# Patient Record
Sex: Female | Born: 1940 | Race: White | Hispanic: No | Smoking: Never smoker
Health system: Southern US, Community
[De-identification: ages and names within clinical notes are randomized; demographics above are authoritative.]

## PROBLEM LIST (undated history)

## (undated) DIAGNOSIS — M797 Fibromyalgia: Secondary | ICD-10-CM

## (undated) DIAGNOSIS — I10 Essential (primary) hypertension: Secondary | ICD-10-CM

## (undated) DIAGNOSIS — C8580 Other specified types of non-Hodgkin lymphoma, unspecified site: Secondary | ICD-10-CM

## (undated) DIAGNOSIS — K589 Irritable bowel syndrome without diarrhea: Secondary | ICD-10-CM

## (undated) DIAGNOSIS — Z8669 Personal history of other diseases of the nervous system and sense organs: Secondary | ICD-10-CM

## (undated) DIAGNOSIS — E785 Hyperlipidemia, unspecified: Secondary | ICD-10-CM

## (undated) DIAGNOSIS — E119 Type 2 diabetes mellitus without complications: Secondary | ICD-10-CM

## (undated) DIAGNOSIS — T7840XA Allergy, unspecified, initial encounter: Secondary | ICD-10-CM

## (undated) DIAGNOSIS — E559 Vitamin D deficiency, unspecified: Secondary | ICD-10-CM

## (undated) DIAGNOSIS — F418 Other specified anxiety disorders: Secondary | ICD-10-CM

## (undated) DIAGNOSIS — K219 Gastro-esophageal reflux disease without esophagitis: Secondary | ICD-10-CM

## (undated) HISTORY — DX: Hyperlipidemia, unspecified: E78.5

## (undated) HISTORY — DX: Vitamin D deficiency, unspecified: E55.9

## (undated) HISTORY — DX: Allergy, unspecified, initial encounter: T78.40XA

## (undated) HISTORY — DX: Other specified types of non-hodgkin lymphoma, unspecified site: C85.80

## (undated) HISTORY — PX: TONSILLECTOMY: SUR1361

## (undated) HISTORY — DX: Irritable bowel syndrome, unspecified: K58.9

## (undated) HISTORY — DX: Other specified anxiety disorders: F41.8

## (undated) HISTORY — DX: Essential (primary) hypertension: I10

## (undated) HISTORY — PX: ABDOMINAL HYSTERECTOMY: SHX81

## (undated) HISTORY — DX: Personal history of other diseases of the nervous system and sense organs: Z86.69

## (undated) HISTORY — DX: Gastro-esophageal reflux disease without esophagitis: K21.9

---

## 1997-09-16 ENCOUNTER — Other Ambulatory Visit: Admission: RE | Admit: 1997-09-16 | Discharge: 1997-09-16 | Payer: Self-pay | Admitting: *Deleted

## 1997-11-19 ENCOUNTER — Other Ambulatory Visit: Admission: RE | Admit: 1997-11-19 | Discharge: 1997-11-19 | Payer: Self-pay | Admitting: Oncology

## 1997-12-23 ENCOUNTER — Ambulatory Visit (HOSPITAL_COMMUNITY): Admission: RE | Admit: 1997-12-23 | Discharge: 1997-12-23 | Payer: Self-pay | Admitting: *Deleted

## 1998-01-08 ENCOUNTER — Encounter: Admission: RE | Admit: 1998-01-08 | Discharge: 1998-04-08 | Payer: Self-pay | Admitting: *Deleted

## 1998-01-28 ENCOUNTER — Other Ambulatory Visit: Admission: RE | Admit: 1998-01-28 | Discharge: 1998-01-28 | Payer: Self-pay | Admitting: *Deleted

## 1998-05-12 ENCOUNTER — Other Ambulatory Visit: Admission: RE | Admit: 1998-05-12 | Discharge: 1998-05-12 | Payer: Self-pay | Admitting: Gastroenterology

## 1998-10-20 ENCOUNTER — Ambulatory Visit (HOSPITAL_COMMUNITY): Admission: RE | Admit: 1998-10-20 | Discharge: 1998-10-20 | Payer: Self-pay | Admitting: *Deleted

## 1999-01-26 ENCOUNTER — Ambulatory Visit (HOSPITAL_COMMUNITY): Admission: RE | Admit: 1999-01-26 | Discharge: 1999-01-26 | Payer: Self-pay | Admitting: *Deleted

## 1999-02-09 ENCOUNTER — Ambulatory Visit (HOSPITAL_COMMUNITY): Admission: RE | Admit: 1999-02-09 | Discharge: 1999-02-09 | Payer: Self-pay | Admitting: *Deleted

## 1999-02-16 ENCOUNTER — Other Ambulatory Visit: Admission: RE | Admit: 1999-02-16 | Discharge: 1999-02-16 | Payer: Self-pay | Admitting: *Deleted

## 2000-03-02 ENCOUNTER — Other Ambulatory Visit: Admission: RE | Admit: 2000-03-02 | Discharge: 2000-03-02 | Payer: Self-pay | Admitting: *Deleted

## 2000-03-14 ENCOUNTER — Ambulatory Visit (HOSPITAL_COMMUNITY): Admission: RE | Admit: 2000-03-14 | Discharge: 2000-03-14 | Payer: Self-pay | Admitting: *Deleted

## 2000-06-21 ENCOUNTER — Encounter: Admission: RE | Admit: 2000-06-21 | Discharge: 2000-06-21 | Payer: Self-pay | Admitting: Rheumatology

## 2000-06-21 ENCOUNTER — Encounter: Payer: Self-pay | Admitting: Rheumatology

## 2001-04-18 ENCOUNTER — Ambulatory Visit (HOSPITAL_COMMUNITY): Admission: RE | Admit: 2001-04-18 | Discharge: 2001-04-18 | Payer: Self-pay | Admitting: Internal Medicine

## 2001-04-18 ENCOUNTER — Encounter: Payer: Self-pay | Admitting: Internal Medicine

## 2002-04-22 ENCOUNTER — Ambulatory Visit (HOSPITAL_COMMUNITY): Admission: RE | Admit: 2002-04-22 | Discharge: 2002-04-22 | Payer: Self-pay | Admitting: Internal Medicine

## 2002-04-22 ENCOUNTER — Encounter: Payer: Self-pay | Admitting: Internal Medicine

## 2002-08-30 ENCOUNTER — Encounter: Admission: RE | Admit: 2002-08-30 | Discharge: 2002-08-30 | Payer: Self-pay | Admitting: Rheumatology

## 2002-08-30 ENCOUNTER — Encounter: Payer: Self-pay | Admitting: Rheumatology

## 2003-04-25 ENCOUNTER — Ambulatory Visit (HOSPITAL_COMMUNITY): Admission: RE | Admit: 2003-04-25 | Discharge: 2003-04-25 | Payer: Self-pay | Admitting: Internal Medicine

## 2003-06-09 ENCOUNTER — Other Ambulatory Visit: Admission: RE | Admit: 2003-06-09 | Discharge: 2003-06-09 | Payer: Self-pay | Admitting: Internal Medicine

## 2003-09-19 ENCOUNTER — Ambulatory Visit (HOSPITAL_COMMUNITY): Admission: RE | Admit: 2003-09-19 | Discharge: 2003-09-19 | Payer: Self-pay | Admitting: Otolaryngology

## 2004-05-24 ENCOUNTER — Encounter: Admission: RE | Admit: 2004-05-24 | Discharge: 2004-05-24 | Payer: Self-pay | Admitting: Internal Medicine

## 2004-06-22 ENCOUNTER — Other Ambulatory Visit: Admission: RE | Admit: 2004-06-22 | Discharge: 2004-06-22 | Payer: Self-pay | Admitting: Internal Medicine

## 2005-02-08 ENCOUNTER — Encounter: Admission: RE | Admit: 2005-02-08 | Discharge: 2005-02-08 | Payer: Self-pay | Admitting: Rheumatology

## 2005-02-21 ENCOUNTER — Encounter: Admission: RE | Admit: 2005-02-21 | Discharge: 2005-02-21 | Payer: Self-pay | Admitting: Rheumatology

## 2005-07-26 ENCOUNTER — Other Ambulatory Visit: Admission: RE | Admit: 2005-07-26 | Discharge: 2005-07-26 | Payer: Self-pay | Admitting: Internal Medicine

## 2005-07-26 ENCOUNTER — Encounter: Admission: RE | Admit: 2005-07-26 | Discharge: 2005-07-26 | Payer: Self-pay | Admitting: Internal Medicine

## 2006-03-31 ENCOUNTER — Emergency Department (HOSPITAL_COMMUNITY): Admission: EM | Admit: 2006-03-31 | Discharge: 2006-04-01 | Payer: Self-pay | Admitting: Emergency Medicine

## 2006-04-04 ENCOUNTER — Ambulatory Visit (HOSPITAL_COMMUNITY): Admission: RE | Admit: 2006-04-04 | Discharge: 2006-04-04 | Payer: Self-pay | Admitting: Internal Medicine

## 2006-04-04 ENCOUNTER — Encounter: Payer: Self-pay | Admitting: Vascular Surgery

## 2006-08-10 ENCOUNTER — Encounter: Admission: RE | Admit: 2006-08-10 | Discharge: 2006-08-10 | Payer: Self-pay | Admitting: Internal Medicine

## 2006-09-21 ENCOUNTER — Encounter: Admission: RE | Admit: 2006-09-21 | Discharge: 2006-09-21 | Payer: Self-pay | Admitting: Rheumatology

## 2007-03-29 ENCOUNTER — Encounter (INDEPENDENT_AMBULATORY_CARE_PROVIDER_SITE_OTHER): Payer: Self-pay | Admitting: Cardiology

## 2007-03-29 ENCOUNTER — Ambulatory Visit (HOSPITAL_COMMUNITY): Admission: RE | Admit: 2007-03-29 | Discharge: 2007-03-29 | Payer: Self-pay | Admitting: Cardiology

## 2007-03-29 ENCOUNTER — Ambulatory Visit: Payer: Self-pay | Admitting: Surgery

## 2007-08-17 ENCOUNTER — Encounter: Admission: RE | Admit: 2007-08-17 | Discharge: 2007-08-17 | Payer: Self-pay | Admitting: Internal Medicine

## 2008-03-05 ENCOUNTER — Encounter: Admission: RE | Admit: 2008-03-05 | Discharge: 2008-03-05 | Payer: Self-pay | Admitting: Orthopaedic Surgery

## 2008-06-17 ENCOUNTER — Ambulatory Visit (HOSPITAL_COMMUNITY): Admission: RE | Admit: 2008-06-17 | Discharge: 2008-06-17 | Payer: Self-pay | Admitting: Internal Medicine

## 2008-08-08 ENCOUNTER — Encounter: Admission: RE | Admit: 2008-08-08 | Discharge: 2008-08-08 | Payer: Self-pay | Admitting: Otolaryngology

## 2008-09-23 ENCOUNTER — Other Ambulatory Visit: Admission: RE | Admit: 2008-09-23 | Discharge: 2008-09-23 | Payer: Self-pay | Admitting: Internal Medicine

## 2008-11-04 ENCOUNTER — Encounter: Admission: RE | Admit: 2008-11-04 | Discharge: 2008-11-04 | Payer: Self-pay | Admitting: Internal Medicine

## 2010-01-13 ENCOUNTER — Ambulatory Visit (HOSPITAL_COMMUNITY): Admission: RE | Admit: 2010-01-13 | Discharge: 2010-01-14 | Payer: Self-pay | Admitting: Surgery

## 2010-03-23 ENCOUNTER — Ambulatory Visit (HOSPITAL_COMMUNITY): Admission: RE | Admit: 2010-03-23 | Discharge: 2010-03-23 | Payer: Self-pay | Admitting: Internal Medicine

## 2010-07-04 ENCOUNTER — Encounter: Payer: Self-pay | Admitting: Hematology & Oncology

## 2010-07-28 ENCOUNTER — Other Ambulatory Visit: Payer: Self-pay | Admitting: Internal Medicine

## 2010-07-28 DIAGNOSIS — Z1231 Encounter for screening mammogram for malignant neoplasm of breast: Secondary | ICD-10-CM

## 2010-08-05 ENCOUNTER — Ambulatory Visit
Admission: RE | Admit: 2010-08-05 | Discharge: 2010-08-05 | Disposition: A | Discharge status: Home / Self Care | Payer: Medicare Other | Source: Ambulatory Visit | Attending: Internal Medicine | Admitting: Internal Medicine

## 2010-08-05 DIAGNOSIS — Z1231 Encounter for screening mammogram for malignant neoplasm of breast: Secondary | ICD-10-CM

## 2010-08-27 LAB — BASIC METABOLIC PANEL
BUN: 17 mg/dL (ref 6–23)
CO2: 28 mEq/L (ref 19–32)
Calcium: 9.2 mg/dL (ref 8.4–10.5)
Chloride: 105 mEq/L (ref 96–112)
Creatinine, Ser: 0.69 mg/dL (ref 0.4–1.2)
GFR calc Af Amer: >60 mL/min (ref >60)
GFR calc non Af Amer: >60 mL/min (ref >60)
Glucose, Bld: 92 mg/dL (ref 70–99)
Potassium: 3.9 mEq/L (ref 3.5–5.1)
Sodium: 142 mEq/L (ref 135–145)

## 2010-08-27 LAB — CBC
HCT: 36.9 % (ref 36.0–46.0)
Hemoglobin: 12.6 g/dL (ref 12.0–15.0)
MCH: 31.7 pg (ref 26.0–34.0)
MCHC: 34.3 g/dL (ref 30.0–36.0)
MCV: 92.6 fL (ref 78.0–100.0)
Platelets: 278 10*3/uL (ref 150–400)
RBC: 3.98 MIL/uL (ref 3.87–5.11)
RDW: 13.5 % (ref 11.5–15.5)
WBC: 16.2 10*3/uL — ABNORMAL HIGH (ref 4.0–10.5)

## 2010-08-27 LAB — ANAEROBIC CULTURE

## 2010-08-27 LAB — SURGICAL PCR SCREEN
MRSA, PCR: NEGATIVE
Staphylococcus aureus: NEGATIVE

## 2010-08-27 LAB — WOUND CULTURE

## 2011-02-24 ENCOUNTER — Other Ambulatory Visit: Payer: Self-pay | Admitting: Internal Medicine

## 2011-02-24 DIAGNOSIS — IMO0002 Reserved for concepts with insufficient information to code with codable children: Secondary | ICD-10-CM

## 2011-02-28 ENCOUNTER — Ambulatory Visit
Admission: RE | Admit: 2011-02-28 | Discharge: 2011-02-28 | Disposition: A | Discharge status: Home / Self Care | Payer: Medicare Other | Source: Ambulatory Visit | Attending: Internal Medicine | Admitting: Internal Medicine

## 2011-02-28 DIAGNOSIS — IMO0002 Reserved for concepts with insufficient information to code with codable children: Secondary | ICD-10-CM

## 2011-03-11 ENCOUNTER — Other Ambulatory Visit: Payer: Self-pay | Admitting: Internal Medicine

## 2011-03-11 DIAGNOSIS — IMO0001 Reserved for inherently not codable concepts without codable children: Secondary | ICD-10-CM

## 2011-03-15 ENCOUNTER — Ambulatory Visit
Admission: RE | Admit: 2011-03-15 | Discharge: 2011-03-15 | Disposition: A | Discharge status: Home / Self Care | Payer: Medicare Other | Source: Ambulatory Visit | Attending: Internal Medicine | Admitting: Internal Medicine

## 2011-03-15 DIAGNOSIS — IMO0001 Reserved for inherently not codable concepts without codable children: Secondary | ICD-10-CM

## 2011-07-11 DIAGNOSIS — Z800 Family history of malignant neoplasm of digestive organs: Secondary | ICD-10-CM | POA: Diagnosis not present

## 2011-07-11 DIAGNOSIS — R198 Other specified symptoms and signs involving the digestive system and abdomen: Secondary | ICD-10-CM | POA: Diagnosis not present

## 2011-07-11 DIAGNOSIS — K5900 Constipation, unspecified: Secondary | ICD-10-CM | POA: Diagnosis not present

## 2011-08-08 DIAGNOSIS — Z1211 Encounter for screening for malignant neoplasm of colon: Secondary | ICD-10-CM | POA: Diagnosis not present

## 2011-08-08 DIAGNOSIS — K644 Residual hemorrhoidal skin tags: Secondary | ICD-10-CM | POA: Diagnosis not present

## 2011-08-08 DIAGNOSIS — K648 Other hemorrhoids: Secondary | ICD-10-CM | POA: Diagnosis not present

## 2011-08-08 DIAGNOSIS — Z800 Family history of malignant neoplasm of digestive organs: Secondary | ICD-10-CM | POA: Diagnosis not present

## 2011-08-08 DIAGNOSIS — K5730 Diverticulosis of large intestine without perforation or abscess without bleeding: Secondary | ICD-10-CM | POA: Diagnosis not present

## 2011-08-08 LAB — HM COLONOSCOPY

## 2011-10-04 DIAGNOSIS — R7309 Other abnormal glucose: Secondary | ICD-10-CM | POA: Diagnosis not present

## 2011-10-04 DIAGNOSIS — Z1212 Encounter for screening for malignant neoplasm of rectum: Secondary | ICD-10-CM | POA: Diagnosis not present

## 2011-10-04 DIAGNOSIS — Z111 Encounter for screening for respiratory tuberculosis: Secondary | ICD-10-CM | POA: Diagnosis not present

## 2011-10-04 DIAGNOSIS — I10 Essential (primary) hypertension: Secondary | ICD-10-CM | POA: Diagnosis not present

## 2011-10-04 DIAGNOSIS — E538 Deficiency of other specified B group vitamins: Secondary | ICD-10-CM | POA: Diagnosis not present

## 2011-10-04 DIAGNOSIS — E782 Mixed hyperlipidemia: Secondary | ICD-10-CM | POA: Diagnosis not present

## 2011-10-04 DIAGNOSIS — E559 Vitamin D deficiency, unspecified: Secondary | ICD-10-CM | POA: Diagnosis not present

## 2011-10-04 DIAGNOSIS — Z79899 Other long term (current) drug therapy: Secondary | ICD-10-CM | POA: Diagnosis not present

## 2011-11-08 DIAGNOSIS — M19049 Primary osteoarthritis, unspecified hand: Secondary | ICD-10-CM | POA: Diagnosis not present

## 2011-11-08 DIAGNOSIS — IMO0002 Reserved for concepts with insufficient information to code with codable children: Secondary | ICD-10-CM | POA: Diagnosis not present

## 2011-11-08 DIAGNOSIS — IMO0001 Reserved for inherently not codable concepts without codable children: Secondary | ICD-10-CM | POA: Diagnosis not present

## 2011-11-08 DIAGNOSIS — M5137 Other intervertebral disc degeneration, lumbosacral region: Secondary | ICD-10-CM | POA: Diagnosis not present

## 2012-01-03 DIAGNOSIS — R7309 Other abnormal glucose: Secondary | ICD-10-CM | POA: Diagnosis not present

## 2012-01-03 DIAGNOSIS — I10 Essential (primary) hypertension: Secondary | ICD-10-CM | POA: Diagnosis not present

## 2012-01-03 DIAGNOSIS — Z79899 Other long term (current) drug therapy: Secondary | ICD-10-CM | POA: Diagnosis not present

## 2012-01-03 DIAGNOSIS — E782 Mixed hyperlipidemia: Secondary | ICD-10-CM | POA: Diagnosis not present

## 2012-01-03 DIAGNOSIS — E559 Vitamin D deficiency, unspecified: Secondary | ICD-10-CM | POA: Diagnosis not present

## 2012-01-05 DIAGNOSIS — Z049 Encounter for examination and observation for unspecified reason: Secondary | ICD-10-CM | POA: Diagnosis not present

## 2012-01-05 DIAGNOSIS — G43019 Migraine without aura, intractable, without status migrainosus: Secondary | ICD-10-CM | POA: Diagnosis not present

## 2012-01-05 DIAGNOSIS — Z79899 Other long term (current) drug therapy: Secondary | ICD-10-CM | POA: Diagnosis not present

## 2012-01-11 DIAGNOSIS — R51 Headache: Secondary | ICD-10-CM | POA: Diagnosis not present

## 2012-01-11 DIAGNOSIS — M542 Cervicalgia: Secondary | ICD-10-CM | POA: Diagnosis not present

## 2012-01-11 DIAGNOSIS — IMO0001 Reserved for inherently not codable concepts without codable children: Secondary | ICD-10-CM | POA: Diagnosis not present

## 2012-01-11 DIAGNOSIS — G518 Other disorders of facial nerve: Secondary | ICD-10-CM | POA: Diagnosis not present

## 2012-01-25 DIAGNOSIS — IMO0001 Reserved for inherently not codable concepts without codable children: Secondary | ICD-10-CM | POA: Diagnosis not present

## 2012-01-25 DIAGNOSIS — G518 Other disorders of facial nerve: Secondary | ICD-10-CM | POA: Diagnosis not present

## 2012-01-25 DIAGNOSIS — M542 Cervicalgia: Secondary | ICD-10-CM | POA: Diagnosis not present

## 2012-01-25 DIAGNOSIS — R51 Headache: Secondary | ICD-10-CM | POA: Diagnosis not present

## 2012-02-08 DIAGNOSIS — M542 Cervicalgia: Secondary | ICD-10-CM | POA: Diagnosis not present

## 2012-02-08 DIAGNOSIS — R51 Headache: Secondary | ICD-10-CM | POA: Diagnosis not present

## 2012-02-08 DIAGNOSIS — G518 Other disorders of facial nerve: Secondary | ICD-10-CM | POA: Diagnosis not present

## 2012-02-08 DIAGNOSIS — IMO0001 Reserved for inherently not codable concepts without codable children: Secondary | ICD-10-CM | POA: Diagnosis not present

## 2012-02-22 DIAGNOSIS — R51 Headache: Secondary | ICD-10-CM | POA: Diagnosis not present

## 2012-02-22 DIAGNOSIS — IMO0001 Reserved for inherently not codable concepts without codable children: Secondary | ICD-10-CM | POA: Diagnosis not present

## 2012-02-22 DIAGNOSIS — M542 Cervicalgia: Secondary | ICD-10-CM | POA: Diagnosis not present

## 2012-02-22 DIAGNOSIS — G518 Other disorders of facial nerve: Secondary | ICD-10-CM | POA: Diagnosis not present

## 2012-03-16 DIAGNOSIS — Z23 Encounter for immunization: Secondary | ICD-10-CM | POA: Diagnosis not present

## 2012-03-30 DIAGNOSIS — R5383 Other fatigue: Secondary | ICD-10-CM | POA: Diagnosis not present

## 2012-03-30 DIAGNOSIS — G4700 Insomnia, unspecified: Secondary | ICD-10-CM | POA: Diagnosis not present

## 2012-03-30 DIAGNOSIS — R5381 Other malaise: Secondary | ICD-10-CM | POA: Diagnosis not present

## 2012-03-30 DIAGNOSIS — M2550 Pain in unspecified joint: Secondary | ICD-10-CM | POA: Diagnosis not present

## 2012-03-30 DIAGNOSIS — M19049 Primary osteoarthritis, unspecified hand: Secondary | ICD-10-CM | POA: Diagnosis not present

## 2012-04-04 DIAGNOSIS — E559 Vitamin D deficiency, unspecified: Secondary | ICD-10-CM | POA: Diagnosis not present

## 2012-04-04 DIAGNOSIS — R7309 Other abnormal glucose: Secondary | ICD-10-CM | POA: Diagnosis not present

## 2012-04-04 DIAGNOSIS — I10 Essential (primary) hypertension: Secondary | ICD-10-CM | POA: Diagnosis not present

## 2012-04-04 DIAGNOSIS — Z79899 Other long term (current) drug therapy: Secondary | ICD-10-CM | POA: Diagnosis not present

## 2012-04-04 DIAGNOSIS — E782 Mixed hyperlipidemia: Secondary | ICD-10-CM | POA: Diagnosis not present

## 2012-04-18 DIAGNOSIS — G43019 Migraine without aura, intractable, without status migrainosus: Secondary | ICD-10-CM | POA: Diagnosis not present

## 2012-07-11 DIAGNOSIS — R7309 Other abnormal glucose: Secondary | ICD-10-CM | POA: Diagnosis not present

## 2012-07-11 DIAGNOSIS — Z79899 Other long term (current) drug therapy: Secondary | ICD-10-CM | POA: Diagnosis not present

## 2012-07-11 DIAGNOSIS — E559 Vitamin D deficiency, unspecified: Secondary | ICD-10-CM | POA: Diagnosis not present

## 2012-07-11 DIAGNOSIS — I10 Essential (primary) hypertension: Secondary | ICD-10-CM | POA: Diagnosis not present

## 2012-07-11 DIAGNOSIS — E782 Mixed hyperlipidemia: Secondary | ICD-10-CM | POA: Diagnosis not present

## 2012-07-12 ENCOUNTER — Other Ambulatory Visit: Payer: Self-pay | Admitting: Internal Medicine

## 2012-07-12 DIAGNOSIS — Z1231 Encounter for screening mammogram for malignant neoplasm of breast: Secondary | ICD-10-CM

## 2012-07-12 DIAGNOSIS — R1013 Epigastric pain: Secondary | ICD-10-CM | POA: Diagnosis not present

## 2012-07-12 DIAGNOSIS — K3189 Other diseases of stomach and duodenum: Secondary | ICD-10-CM | POA: Diagnosis not present

## 2012-08-02 DIAGNOSIS — K219 Gastro-esophageal reflux disease without esophagitis: Secondary | ICD-10-CM | POA: Diagnosis not present

## 2012-08-02 DIAGNOSIS — K3189 Other diseases of stomach and duodenum: Secondary | ICD-10-CM | POA: Diagnosis not present

## 2012-08-02 DIAGNOSIS — R1013 Epigastric pain: Secondary | ICD-10-CM | POA: Diagnosis not present

## 2012-08-03 DIAGNOSIS — G43719 Chronic migraine without aura, intractable, without status migrainosus: Secondary | ICD-10-CM | POA: Diagnosis not present

## 2012-08-03 DIAGNOSIS — G43019 Migraine without aura, intractable, without status migrainosus: Secondary | ICD-10-CM | POA: Diagnosis not present

## 2012-08-07 ENCOUNTER — Ambulatory Visit
Admission: RE | Admit: 2012-08-07 | Discharge: 2012-08-07 | Disposition: A | Discharge status: Home / Self Care | Payer: Medicare Other | Source: Ambulatory Visit | Attending: Internal Medicine | Admitting: Internal Medicine

## 2012-08-07 DIAGNOSIS — Z1231 Encounter for screening mammogram for malignant neoplasm of breast: Secondary | ICD-10-CM

## 2012-08-07 LAB — HM MAMMOGRAPHY: HM Mammogram: NORMAL

## 2012-08-20 DIAGNOSIS — Z79899 Other long term (current) drug therapy: Secondary | ICD-10-CM | POA: Diagnosis not present

## 2012-08-20 DIAGNOSIS — B029 Zoster without complications: Secondary | ICD-10-CM | POA: Diagnosis not present

## 2012-08-30 DIAGNOSIS — H04129 Dry eye syndrome of unspecified lacrimal gland: Secondary | ICD-10-CM | POA: Diagnosis not present

## 2012-08-30 DIAGNOSIS — H52209 Unspecified astigmatism, unspecified eye: Secondary | ICD-10-CM | POA: Diagnosis not present

## 2012-08-30 DIAGNOSIS — H18519 Endothelial corneal dystrophy, unspecified eye: Secondary | ICD-10-CM | POA: Diagnosis not present

## 2012-08-30 DIAGNOSIS — Z961 Presence of intraocular lens: Secondary | ICD-10-CM | POA: Diagnosis not present

## 2012-10-04 DIAGNOSIS — Z79899 Other long term (current) drug therapy: Secondary | ICD-10-CM | POA: Diagnosis not present

## 2012-10-04 DIAGNOSIS — Z23 Encounter for immunization: Secondary | ICD-10-CM | POA: Diagnosis not present

## 2012-10-04 DIAGNOSIS — R7309 Other abnormal glucose: Secondary | ICD-10-CM | POA: Diagnosis not present

## 2012-10-04 DIAGNOSIS — Z0000 Encounter for general adult medical examination without abnormal findings: Secondary | ICD-10-CM | POA: Diagnosis not present

## 2012-10-04 DIAGNOSIS — I10 Essential (primary) hypertension: Secondary | ICD-10-CM | POA: Diagnosis not present

## 2012-10-04 DIAGNOSIS — E782 Mixed hyperlipidemia: Secondary | ICD-10-CM | POA: Diagnosis not present

## 2012-10-04 DIAGNOSIS — Z1212 Encounter for screening for malignant neoplasm of rectum: Secondary | ICD-10-CM | POA: Diagnosis not present

## 2012-10-25 DIAGNOSIS — M1710 Unilateral primary osteoarthritis, unspecified knee: Secondary | ICD-10-CM | POA: Diagnosis not present

## 2012-10-25 DIAGNOSIS — IMO0001 Reserved for inherently not codable concepts without codable children: Secondary | ICD-10-CM | POA: Diagnosis not present

## 2012-10-25 DIAGNOSIS — M19049 Primary osteoarthritis, unspecified hand: Secondary | ICD-10-CM | POA: Diagnosis not present

## 2012-10-25 DIAGNOSIS — M533 Sacrococcygeal disorders, not elsewhere classified: Secondary | ICD-10-CM | POA: Diagnosis not present

## 2012-10-25 DIAGNOSIS — R5381 Other malaise: Secondary | ICD-10-CM | POA: Diagnosis not present

## 2012-10-25 DIAGNOSIS — R5383 Other fatigue: Secondary | ICD-10-CM | POA: Diagnosis not present

## 2012-11-02 DIAGNOSIS — G43019 Migraine without aura, intractable, without status migrainosus: Secondary | ICD-10-CM | POA: Diagnosis not present

## 2012-11-02 DIAGNOSIS — G43719 Chronic migraine without aura, intractable, without status migrainosus: Secondary | ICD-10-CM | POA: Diagnosis not present

## 2012-12-12 DIAGNOSIS — M545 Low back pain, unspecified: Secondary | ICD-10-CM | POA: Diagnosis not present

## 2012-12-12 DIAGNOSIS — M79609 Pain in unspecified limb: Secondary | ICD-10-CM | POA: Diagnosis not present

## 2013-01-01 DIAGNOSIS — IMO0002 Reserved for concepts with insufficient information to code with codable children: Secondary | ICD-10-CM | POA: Diagnosis not present

## 2013-01-01 DIAGNOSIS — M47817 Spondylosis without myelopathy or radiculopathy, lumbosacral region: Secondary | ICD-10-CM | POA: Diagnosis not present

## 2013-01-03 DIAGNOSIS — Z79899 Other long term (current) drug therapy: Secondary | ICD-10-CM | POA: Diagnosis not present

## 2013-01-03 DIAGNOSIS — E782 Mixed hyperlipidemia: Secondary | ICD-10-CM | POA: Diagnosis not present

## 2013-01-03 DIAGNOSIS — R7309 Other abnormal glucose: Secondary | ICD-10-CM | POA: Diagnosis not present

## 2013-01-03 DIAGNOSIS — H6120 Impacted cerumen, unspecified ear: Secondary | ICD-10-CM | POA: Diagnosis not present

## 2013-01-03 DIAGNOSIS — I10 Essential (primary) hypertension: Secondary | ICD-10-CM | POA: Diagnosis not present

## 2013-01-03 DIAGNOSIS — E559 Vitamin D deficiency, unspecified: Secondary | ICD-10-CM | POA: Diagnosis not present

## 2013-01-03 DIAGNOSIS — E538 Deficiency of other specified B group vitamins: Secondary | ICD-10-CM | POA: Diagnosis not present

## 2013-01-03 DIAGNOSIS — D649 Anemia, unspecified: Secondary | ICD-10-CM | POA: Diagnosis not present

## 2013-01-07 DIAGNOSIS — M545 Low back pain, unspecified: Secondary | ICD-10-CM | POA: Diagnosis not present

## 2013-01-07 DIAGNOSIS — M47817 Spondylosis without myelopathy or radiculopathy, lumbosacral region: Secondary | ICD-10-CM | POA: Diagnosis not present

## 2013-01-15 DIAGNOSIS — M1710 Unilateral primary osteoarthritis, unspecified knee: Secondary | ICD-10-CM | POA: Diagnosis not present

## 2013-01-15 DIAGNOSIS — M25569 Pain in unspecified knee: Secondary | ICD-10-CM | POA: Diagnosis not present

## 2013-01-24 DIAGNOSIS — R5383 Other fatigue: Secondary | ICD-10-CM | POA: Diagnosis not present

## 2013-01-24 DIAGNOSIS — IMO0001 Reserved for inherently not codable concepts without codable children: Secondary | ICD-10-CM | POA: Diagnosis not present

## 2013-01-24 DIAGNOSIS — M25569 Pain in unspecified knee: Secondary | ICD-10-CM | POA: Diagnosis not present

## 2013-01-24 DIAGNOSIS — M5137 Other intervertebral disc degeneration, lumbosacral region: Secondary | ICD-10-CM | POA: Diagnosis not present

## 2013-01-24 DIAGNOSIS — G4700 Insomnia, unspecified: Secondary | ICD-10-CM | POA: Diagnosis not present

## 2013-01-24 DIAGNOSIS — M1710 Unilateral primary osteoarthritis, unspecified knee: Secondary | ICD-10-CM | POA: Diagnosis not present

## 2013-01-24 DIAGNOSIS — R5381 Other malaise: Secondary | ICD-10-CM | POA: Diagnosis not present

## 2013-03-01 DIAGNOSIS — E782 Mixed hyperlipidemia: Secondary | ICD-10-CM | POA: Diagnosis not present

## 2013-03-01 DIAGNOSIS — I10 Essential (primary) hypertension: Secondary | ICD-10-CM | POA: Diagnosis not present

## 2013-03-01 DIAGNOSIS — Z23 Encounter for immunization: Secondary | ICD-10-CM | POA: Diagnosis not present

## 2013-03-01 DIAGNOSIS — E109 Type 1 diabetes mellitus without complications: Secondary | ICD-10-CM | POA: Diagnosis not present

## 2013-03-07 DIAGNOSIS — G43019 Migraine without aura, intractable, without status migrainosus: Secondary | ICD-10-CM | POA: Diagnosis not present

## 2013-03-07 DIAGNOSIS — G43719 Chronic migraine without aura, intractable, without status migrainosus: Secondary | ICD-10-CM | POA: Diagnosis not present

## 2013-03-13 DIAGNOSIS — G518 Other disorders of facial nerve: Secondary | ICD-10-CM | POA: Diagnosis not present

## 2013-03-13 DIAGNOSIS — IMO0001 Reserved for inherently not codable concepts without codable children: Secondary | ICD-10-CM | POA: Diagnosis not present

## 2013-03-13 DIAGNOSIS — M542 Cervicalgia: Secondary | ICD-10-CM | POA: Diagnosis not present

## 2013-03-13 DIAGNOSIS — R51 Headache: Secondary | ICD-10-CM | POA: Diagnosis not present

## 2013-03-27 DIAGNOSIS — G518 Other disorders of facial nerve: Secondary | ICD-10-CM | POA: Diagnosis not present

## 2013-03-27 DIAGNOSIS — IMO0001 Reserved for inherently not codable concepts without codable children: Secondary | ICD-10-CM | POA: Diagnosis not present

## 2013-03-27 DIAGNOSIS — R51 Headache: Secondary | ICD-10-CM | POA: Diagnosis not present

## 2013-03-27 DIAGNOSIS — M542 Cervicalgia: Secondary | ICD-10-CM | POA: Diagnosis not present

## 2013-04-10 DIAGNOSIS — R7309 Other abnormal glucose: Secondary | ICD-10-CM | POA: Diagnosis not present

## 2013-04-10 DIAGNOSIS — E559 Vitamin D deficiency, unspecified: Secondary | ICD-10-CM | POA: Diagnosis not present

## 2013-04-10 DIAGNOSIS — Z79899 Other long term (current) drug therapy: Secondary | ICD-10-CM | POA: Diagnosis not present

## 2013-04-10 DIAGNOSIS — I10 Essential (primary) hypertension: Secondary | ICD-10-CM | POA: Diagnosis not present

## 2013-04-10 DIAGNOSIS — Z23 Encounter for immunization: Secondary | ICD-10-CM | POA: Diagnosis not present

## 2013-04-10 DIAGNOSIS — L820 Inflamed seborrheic keratosis: Secondary | ICD-10-CM | POA: Diagnosis not present

## 2013-04-10 DIAGNOSIS — E782 Mixed hyperlipidemia: Secondary | ICD-10-CM | POA: Diagnosis not present

## 2013-04-11 DIAGNOSIS — G518 Other disorders of facial nerve: Secondary | ICD-10-CM | POA: Diagnosis not present

## 2013-04-11 DIAGNOSIS — IMO0001 Reserved for inherently not codable concepts without codable children: Secondary | ICD-10-CM | POA: Diagnosis not present

## 2013-04-11 DIAGNOSIS — R51 Headache: Secondary | ICD-10-CM | POA: Diagnosis not present

## 2013-04-11 DIAGNOSIS — M542 Cervicalgia: Secondary | ICD-10-CM | POA: Diagnosis not present

## 2013-05-03 ENCOUNTER — Other Ambulatory Visit: Payer: Self-pay | Admitting: Orthopaedic Surgery

## 2013-05-03 DIAGNOSIS — M1710 Unilateral primary osteoarthritis, unspecified knee: Secondary | ICD-10-CM | POA: Diagnosis not present

## 2013-05-03 DIAGNOSIS — M25561 Pain in right knee: Secondary | ICD-10-CM

## 2013-05-17 ENCOUNTER — Ambulatory Visit
Admission: RE | Admit: 2013-05-17 | Discharge: 2013-05-17 | Disposition: A | Discharge status: Home / Self Care | Payer: Medicare Other | Source: Ambulatory Visit | Attending: Orthopaedic Surgery | Admitting: Orthopaedic Surgery

## 2013-05-17 DIAGNOSIS — M898X9 Other specified disorders of bone, unspecified site: Secondary | ICD-10-CM | POA: Diagnosis not present

## 2013-05-17 DIAGNOSIS — M25561 Pain in right knee: Secondary | ICD-10-CM

## 2013-05-17 DIAGNOSIS — M1710 Unilateral primary osteoarthritis, unspecified knee: Secondary | ICD-10-CM | POA: Diagnosis not present

## 2013-05-17 DIAGNOSIS — IMO0002 Reserved for concepts with insufficient information to code with codable children: Secondary | ICD-10-CM | POA: Diagnosis not present

## 2013-05-21 ENCOUNTER — Encounter: Payer: Self-pay | Admitting: Internal Medicine

## 2013-05-21 DIAGNOSIS — E1169 Type 2 diabetes mellitus with other specified complication: Secondary | ICD-10-CM | POA: Insufficient documentation

## 2013-05-21 DIAGNOSIS — C8590 Non-Hodgkin lymphoma, unspecified, unspecified site: Secondary | ICD-10-CM | POA: Insufficient documentation

## 2013-05-21 DIAGNOSIS — E559 Vitamin D deficiency, unspecified: Secondary | ICD-10-CM | POA: Insufficient documentation

## 2013-05-21 DIAGNOSIS — I10 Essential (primary) hypertension: Secondary | ICD-10-CM | POA: Insufficient documentation

## 2013-05-21 DIAGNOSIS — K589 Irritable bowel syndrome without diarrhea: Secondary | ICD-10-CM | POA: Insufficient documentation

## 2013-05-21 DIAGNOSIS — E119 Type 2 diabetes mellitus without complications: Secondary | ICD-10-CM | POA: Insufficient documentation

## 2013-05-21 DIAGNOSIS — E1122 Type 2 diabetes mellitus with diabetic chronic kidney disease: Secondary | ICD-10-CM | POA: Insufficient documentation

## 2013-05-21 DIAGNOSIS — K219 Gastro-esophageal reflux disease without esophagitis: Secondary | ICD-10-CM | POA: Insufficient documentation

## 2013-05-22 ENCOUNTER — Ambulatory Visit (INDEPENDENT_AMBULATORY_CARE_PROVIDER_SITE_OTHER): Payer: Medicare Other | Admitting: Physician Assistant

## 2013-05-22 ENCOUNTER — Encounter: Payer: Self-pay | Admitting: Physician Assistant

## 2013-05-22 VITALS — BP 110/70 | HR 80 | Temp 97.4°F | Resp 16 | Ht 65.0 in | Wt 165.0 lb

## 2013-05-22 DIAGNOSIS — M25561 Pain in right knee: Secondary | ICD-10-CM

## 2013-05-22 DIAGNOSIS — M25569 Pain in unspecified knee: Secondary | ICD-10-CM

## 2013-05-22 NOTE — Progress Notes (Signed)
   Subjective:    Patient ID: Debra Short, female    DOB: 1941/06/02, 72 y.o.   MRN: [id number]  HPI Patient states she had MRI of her right knee last Friday by Dr. Deno Etienne and she got an injection in that knee 1 month ago which she states has helped some and she has lost 10 lbs which has helped.  She is here today because she does not know if she would like to proceed with the procedure or not. She does state that her knee limits her exercise, but the pain has gotten better.   Review of Systems  Constitutional: Negative.   HENT: Negative.   Respiratory: Negative.   Cardiovascular: Negative.   Gastrointestinal: Negative.   Endocrine: Negative.   Genitourinary: Negative.   Musculoskeletal: Positive for arthralgias and gait problem.  Skin: Negative.   Neurological: Negative.        Objective:   Physical Exam  Constitutional: She is oriented to person, place, and time. She appears well-developed and well-nourished.  HENT:  Head: Normocephalic and atraumatic.  Right Ear: External ear normal.  Left Ear: External ear normal.  Mouth/Throat: Oropharynx is clear and moist.  Eyes: Conjunctivae and EOM are normal. Pupils are equal, round, and reactive to light.  Neck: Normal range of motion. Neck supple. No thyromegaly present.  Cardiovascular: Normal rate, regular rhythm and normal heart sounds.  Exam reveals no gallop and no friction rub.   No murmur heard. Pulmonary/Chest: Effort normal and breath sounds normal. No respiratory distress. She has no wheezes.  Abdominal: Soft. Bowel sounds are normal. She exhibits no distension and no mass. There is no tenderness. There is no rebound and no guarding.  Musculoskeletal: She exhibits tenderness (right knee medial, + crepitus and decreased ROM, no effusion, erythema).  Lymphadenopathy:    She has no cervical adenopathy.  Neurological: She is alert and oriented to person, place, and time. She displays normal reflexes. No cranial nerve deficit.  Coordination normal.  Skin: Skin is warm and dry.  Psychiatric: She has a normal mood and affect.       Assessment & Plan:  Right knee pain Long discussion about the risks of a knee procedure and the benefits. Her BP is well controlled, no CP,SOB and she is low risk for surgery. She feels that she would be able to exercise more and would like to proceed with the surgery.

## 2013-05-28 ENCOUNTER — Other Ambulatory Visit: Payer: Self-pay | Admitting: Internal Medicine

## 2013-05-29 ENCOUNTER — Other Ambulatory Visit: Payer: Self-pay | Admitting: Physician Assistant

## 2013-06-03 ENCOUNTER — Encounter (HOSPITAL_COMMUNITY): Payer: Self-pay | Admitting: Pharmacy Technician

## 2013-06-03 DIAGNOSIS — G43019 Migraine without aura, intractable, without status migrainosus: Secondary | ICD-10-CM | POA: Diagnosis not present

## 2013-06-03 DIAGNOSIS — G43719 Chronic migraine without aura, intractable, without status migrainosus: Secondary | ICD-10-CM | POA: Diagnosis not present

## 2013-06-10 ENCOUNTER — Other Ambulatory Visit (HOSPITAL_COMMUNITY): Payer: Self-pay | Admitting: Orthopaedic Surgery

## 2013-06-11 ENCOUNTER — Inpatient Hospital Stay (HOSPITAL_COMMUNITY)
Admission: RE | Admit: 2013-06-11 | Discharge: 2013-06-11 | Disposition: A | Discharge status: Home / Self Care | Payer: Medicare Other | Source: Ambulatory Visit

## 2013-06-11 NOTE — Pre-Procedure Instructions (Signed)
Debra Short  06/11/2013   Your procedure is scheduled on:  Monday, January 5th.               Report to [facility], Main Entrance / Entrance "A" at 10:30AM.  Call this number if you have problems the morning of surgery: [phone]   Remember:   Do not eat food or drink liquids after midnight.   Take these medicines the morning of surgery with A SIP OF WATER: bisoprolol-hydrochlorothiazide (ZIAC),esomeprazole (NEXIUM), FLUoxetine (PROZAC), gabapentin (NEURONTIN).  May use nasal inhaler.  May take: HYDROcodone-acetaminophen (NORCO/VICODIN), ALPRAZolam Prudy Feeler).               STOP all herbel meds, nsaids (aleve,naproxen,advil,ibuprofen) 5 days prior to surgery including aspirin,celebrex              Do not wear jewelry, make-up or nail polish.  Do not wear lotions, powders, or perfumes. You may wear deodorant.  Do not shave 48 hours prior to surgery.   Do not bring valuables to the hospital.  [facility] is not responsible                  for any belongings or valuables.               Contacts, dentures or bridgework may not be worn into surgery.  Leave suitcase in the car. After surgery it may be brought to your room.  For patients admitted to the hospital, discharge time is determined by your                treatment team.         Special Instructions: Shower using CHG 2 nights before surgery and the night before surgery.  If you shower the day of surgery use CHG.  Use special wash - you have one bottle of CHG for all showers.  You should use approximately 1/3 of the bottle for each shower.   Please read over the following fact sheets that you were given: Pain Booklet, Coughing and Deep Breathing, Blood Transfusion Information and Surgical Site Infection Prevention

## 2013-06-12 ENCOUNTER — Other Ambulatory Visit: Payer: Self-pay | Admitting: Physician Assistant

## 2013-06-12 MED ORDER — CYCLOBENZAPRINE HCL 10 MG PO TABS: 10.0000 mg | ORAL_TABLET | Freq: Every day | ORAL | Status: DC

## 2013-06-17 ENCOUNTER — Inpatient Hospital Stay (HOSPITAL_COMMUNITY): Admission: RE | Admit: 2013-06-17 | Payer: Medicare Other | Source: Ambulatory Visit | Admitting: Orthopaedic Surgery

## 2013-06-17 ENCOUNTER — Encounter (HOSPITAL_COMMUNITY): Admission: RE | Payer: Self-pay | Source: Ambulatory Visit

## 2013-06-17 SURGERY — ARTHROPLASTY, KNEE, TOTAL
Anesthesia: Spinal | Site: Knee | Laterality: Right

## 2013-07-04 DIAGNOSIS — M5137 Other intervertebral disc degeneration, lumbosacral region: Secondary | ICD-10-CM | POA: Diagnosis not present

## 2013-07-04 DIAGNOSIS — IMO0001 Reserved for inherently not codable concepts without codable children: Secondary | ICD-10-CM | POA: Diagnosis not present

## 2013-07-04 DIAGNOSIS — R5381 Other malaise: Secondary | ICD-10-CM | POA: Diagnosis not present

## 2013-07-04 DIAGNOSIS — M1710 Unilateral primary osteoarthritis, unspecified knee: Secondary | ICD-10-CM | POA: Diagnosis not present

## 2013-07-04 DIAGNOSIS — Z79899 Other long term (current) drug therapy: Secondary | ICD-10-CM | POA: Diagnosis not present

## 2013-07-04 DIAGNOSIS — M19049 Primary osteoarthritis, unspecified hand: Secondary | ICD-10-CM | POA: Diagnosis not present

## 2013-07-04 DIAGNOSIS — Z0389 Encounter for observation for other suspected diseases and conditions ruled out: Secondary | ICD-10-CM | POA: Diagnosis not present

## 2013-07-04 DIAGNOSIS — R5383 Other fatigue: Secondary | ICD-10-CM | POA: Diagnosis not present

## 2013-07-04 DIAGNOSIS — M76899 Other specified enthesopathies of unspecified lower limb, excluding foot: Secondary | ICD-10-CM | POA: Diagnosis not present

## 2013-07-17 ENCOUNTER — Ambulatory Visit (INDEPENDENT_AMBULATORY_CARE_PROVIDER_SITE_OTHER): Payer: Medicare Other | Admitting: Physician Assistant

## 2013-07-17 ENCOUNTER — Other Ambulatory Visit: Payer: Self-pay | Admitting: Internal Medicine

## 2013-07-17 ENCOUNTER — Encounter: Payer: Self-pay | Admitting: Physician Assistant

## 2013-07-17 VITALS — BP 120/68 | HR 76 | Temp 98.1°F | Resp 16 | Ht 65.0 in | Wt 173.0 lb

## 2013-07-17 DIAGNOSIS — R7303 Prediabetes: Secondary | ICD-10-CM

## 2013-07-17 DIAGNOSIS — E785 Hyperlipidemia, unspecified: Secondary | ICD-10-CM

## 2013-07-17 DIAGNOSIS — E559 Vitamin D deficiency, unspecified: Secondary | ICD-10-CM | POA: Diagnosis not present

## 2013-07-17 DIAGNOSIS — Z79899 Other long term (current) drug therapy: Secondary | ICD-10-CM

## 2013-07-17 DIAGNOSIS — G8929 Other chronic pain: Secondary | ICD-10-CM

## 2013-07-17 DIAGNOSIS — M25562 Pain in left knee: Secondary | ICD-10-CM

## 2013-07-17 DIAGNOSIS — R7309 Other abnormal glucose: Secondary | ICD-10-CM | POA: Diagnosis not present

## 2013-07-17 DIAGNOSIS — E782 Mixed hyperlipidemia: Secondary | ICD-10-CM

## 2013-07-17 DIAGNOSIS — I10 Essential (primary) hypertension: Secondary | ICD-10-CM | POA: Diagnosis not present

## 2013-07-17 DIAGNOSIS — M25551 Pain in right hip: Secondary | ICD-10-CM

## 2013-07-17 DIAGNOSIS — M25561 Pain in right knee: Secondary | ICD-10-CM

## 2013-07-17 LAB — HEPATIC FUNCTION PANEL
ALT: 17 U/L (ref 0–35)
AST: 17 U/L (ref 0–37)
Albumin: 4.3 g/dL (ref 3.5–5.2)
Alkaline Phosphatase: 74 U/L (ref 39–117)
Bilirubin, Direct: 0.1 mg/dL (ref 0.0–0.3)
Indirect Bilirubin: 0.6 mg/dL (ref 0.2–1.2)
Total Bilirubin: 0.7 mg/dL (ref 0.2–1.2)
Total Protein: 6.8 g/dL (ref 6.0–8.3)

## 2013-07-17 LAB — CBC WITH DIFFERENTIAL/PLATELET
Basophils Absolute: 0 10*3/uL (ref 0.0–0.1)
Basophils Relative: 0 % (ref 0–1)
Eosinophils Absolute: 0.2 10*3/uL (ref 0.0–0.7)
Eosinophils Relative: 2 % (ref 0–5)
HCT: 40.1 % (ref 36.0–46.0)
Hemoglobin: 13.3 g/dL (ref 12.0–15.0)
Lymphocytes Relative: 18 % (ref 12–46)
Lymphs Abs: 2.1 10*3/uL (ref 0.7–4.0)
MCH: 30.5 pg (ref 26.0–34.0)
MCHC: 33.2 g/dL (ref 30.0–36.0)
MCV: 92 fL (ref 78.0–100.0)
Monocytes Absolute: 0.9 10*3/uL (ref 0.1–1.0)
Monocytes Relative: 8 % (ref 3–12)
Neutro Abs: 8.5 10*3/uL — ABNORMAL HIGH (ref 1.7–7.7)
Neutrophils Relative %: 72 % (ref 43–77)
Platelets: 340 10*3/uL (ref 150–400)
RBC: 4.36 MIL/uL (ref 3.87–5.11)
RDW: 13.9 % (ref 11.5–15.5)
WBC: 11.7 10*3/uL — ABNORMAL HIGH (ref 4.0–10.5)

## 2013-07-17 LAB — TSH: TSH: 2.858 u[IU]/mL (ref 0.350–4.500)

## 2013-07-17 LAB — BASIC METABOLIC PANEL WITH GFR
BUN: 21 mg/dL (ref 6–23)
CO2: 31 mEq/L (ref 19–32)
Calcium: 9.8 mg/dL (ref 8.4–10.5)
Chloride: 96 mEq/L (ref 96–112)
Creat: 0.84 mg/dL (ref 0.50–1.10)
GFR, Est African American: 79 mL/min
GFR, Est Non African American: 69 mL/min
Glucose, Bld: 90 mg/dL (ref 70–99)
Potassium: 5 mEq/L (ref 3.5–5.3)
Sodium: 137 mEq/L (ref 135–145)

## 2013-07-17 LAB — LIPID PANEL
Cholesterol: 175 mg/dL (ref 0–200)
HDL: 56 mg/dL (ref >39)
LDL Cholesterol: 70 mg/dL (ref 0–99)
Total CHOL/HDL Ratio: 3.1 Ratio
Triglycerides: 245 mg/dL — ABNORMAL HIGH (ref <150)
VLDL: 49 mg/dL — ABNORMAL HIGH (ref 0–40)

## 2013-07-17 LAB — HEMOGLOBIN A1C
Hgb A1c MFr Bld: 6 % — ABNORMAL HIGH (ref <5.7)
Mean Plasma Glucose: 126 mg/dL — ABNORMAL HIGH (ref <117)

## 2013-07-17 LAB — MAGNESIUM: Magnesium: 2.3 mg/dL (ref 1.5–2.5)

## 2013-07-17 NOTE — Patient Instructions (Signed)
 Bad carbs also include fruit juice, alcohol, and sweet tea. These are empty calories that do not signal to your brain that you are full.   Please remember the good carbs are still carbs which convert into sugar. So please measure them out no more than 1/2-1 cup of rice, oatmeal, pasta, and beans.  Veggies are however free foods! Pile them on.   I like lean protein at every meal such as chicken, turkey, pork chops, cottage cheese, etc. Just do not fry these meats and please center your meal around vegetable, the meats should be a side dish.   No all fruit is created equal. Please see the list below, the fruit at the bottom is higher in sugars than the fruit at the top   Cholesterol Cholesterol is a white, waxy, fat-like protein needed by your body in small amounts. The liver makes all the cholesterol you need. It is carried from the liver by the blood through the blood vessels. Deposits (plaque) may build up on blood vessel walls. This makes the arteries narrower and stiffer. Plaque increases the risk for heart attack and stroke. You cannot feel your cholesterol level even if it is very high. The only way to know is by a blood test to check your lipid (fats) levels. Once you know your cholesterol levels, you should keep a record of the test results. Work with your caregiver to to keep your levels in the desired range. WHAT THE RESULTS MEAN:  Total cholesterol is a rough measure of all the cholesterol in your blood.  LDL is the so-called bad cholesterol. This is the type that deposits cholesterol in the walls of the arteries. You want this level to be low.  HDL is the good cholesterol because it cleans the arteries and carries the LDL away. You want this level to be high.  Triglycerides are fat that the body can either burn for energy or store. High levels are closely linked to heart disease. DESIRED LEVELS:  Total cholesterol below 200.  LDL below 100 for people at risk, below 70 for very  high risk.  HDL above 50 is good, above 60 is best.  Triglycerides below 150. HOW TO LOWER YOUR CHOLESTEROL:  Diet.  Choose fish or white meat chicken and turkey, roasted or baked. Limit fatty cuts of red meat, fried foods, and processed meats, such as sausage and lunch meat.  Eat lots of fresh fruits and vegetables. Choose whole grains, beans, pasta, potatoes and cereals.  Use only small amounts of olive, corn or canola oils. Avoid butter, mayonnaise, shortening or palm kernel oils. Avoid foods with trans-fats.  Use skim/nonfat milk and low-fat/nonfat yogurt and cheeses. Avoid whole milk, cream, ice cream, egg yolks and cheeses. Healthy desserts include angel food cake, ginger snaps, animal crackers, hard candy, popsicles, and low-fat/nonfat frozen yogurt. Avoid pastries, cakes, pies and cookies.  Exercise.  A regular program helps decrease LDL and raises HDL.  Helps with weight control.  Do things that increase your activity level like gardening, walking, or taking the stairs.  Medication.  May be prescribed by your caregiver to help lowering cholesterol and the risk for heart disease.  You may need medicine even if your levels are normal if you have several risk factors. HOME CARE INSTRUCTIONS   Follow your diet and exercise programs as suggested by your caregiver.  Take medications as directed.  Have blood work done when your caregiver feels it is necessary. MAKE SURE YOU:   Understand   these instructions.  Will watch your condition.  Will get help right away if you are not doing well or get worse. Document Released: 02/22/2001 Document Revised: 08/22/2011 Document Reviewed: 03/13/2013 [employer] Patient Information 2014 [employer].  

## 2013-07-17 NOTE — Progress Notes (Signed)
HPI 73 y.o. female  presents for 3 month follow up with hypertension, hyperlipidemia, prediabetes and vitamin D. Her blood pressure has been controlled at home, today their BP is BP: 120/68 mmHg She denies chest pain, shortness of breath, dizziness.  Her cholesterol is diet controlled. In addition they are on Lipitor 40mg  and denies myalgias. Her cholesterol is not controlled. The cholesterol last visit was: LDL 127, HDL 47, Trigs 226.   She has been working on diet and exercise for prediabetes, and denies blurry vision, polydipsia, polyphagia and polyuria. Last A1C in the office was: 5.8 Patient is on Vitamin D supplement. 59. She was scheduled for a knee surgery but also has right hip/back pain so postponed the surgery and would like to try injections/medications and follow up with Dr. Patrecia Pour    Have discussed water therapy and she will consider it.   Current Medications:  Current Outpatient Prescriptions on File Prior to Visit  Medication Sig Dispense Refill  . ALPRAZolam (XANAX) 1 MG tablet Take 1 mg by mouth 3 (three) times daily as needed for anxiety.      Marland Kitchen aspirin 81 MG chewable tablet Chew by mouth daily.      Marland Kitchen atorvastatin (LIPITOR) 80 MG tablet Take 80 mg by mouth daily.      . bisoprolol-hydrochlorothiazide (ZIAC) 2.5-6.25 MG per tablet Take 1 tablet by mouth daily.      . celecoxib (CELEBREX) 200 MG capsule Take 200 mg by mouth every other day.       . Cholecalciferol (VITAMIN D PO) Take 5,000-10,000 Units by mouth daily. Takes 5,000 units daily, except takes 10,000 units on Mondays, Wednesdays, and Fridays.      . cyclobenzaprine (FLEXERIL) 10 MG tablet Take 1 tablet (10 mg total) by mouth at bedtime.  30 tablet  2  . esomeprazole (NEXIUM) 20 MG capsule Take 20 mg by mouth daily at 12 noon.      Marland Kitchen FLUoxetine (PROZAC) 40 MG capsule Take 40 mg by mouth daily.      Marland Kitchen gabapentin (NEURONTIN) 100 MG capsule Take 100 mg by mouth 2 (two) times daily.       Marland Kitchen HYDROcodone-acetaminophen  (NORCO/VICODIN) 5-325 MG per tablet Take 0.5 tablets by mouth daily as needed for moderate pain.       Vladimir Faster Glycol-Propyl Glycol (SYSTANE OP) Place 1 drop into both eyes daily as needed (for dry eyes).      . pravastatin (PRAVACHOL) 40 MG tablet Take 20 mg by mouth daily.       Marland Kitchen triamcinolone (NASACORT) 55 MCG/ACT AERO nasal inhaler Place 2 sprays into the nose daily as needed (for seasonal allergies).       . zolpidem (AMBIEN) 10 MG tablet Take 5 mg by mouth at bedtime as needed for sleep.       No current facility-administered medications on file prior to visit.   Medical History:  Past Medical History  Diagnosis Date  . Hyperlipidemia   . Hypertension   . Arthritis   . GERD (gastroesophageal reflux disease)   . Vitamin D deficiency   . IBS (irritable bowel syndrome)   . Prediabetes   . Large cell lymphoma    Allergies:  Allergies  Allergen Reactions  . Effexor [Venlafaxine] Other (See Comments)    REACTION:  unknown  . Nortriptyline Other (See Comments)    REACTION:  unknown  . Paxil [Paroxetine Hcl] Other (See Comments)    REACTION:  unknown  . Robaxin [Methocarbamol] Itching  .  Zoloft [Sertraline Hcl] Other (See Comments)    REACTION: unknown    ROS Constitutional: Denies fever, chills, headaches, insomnia, fatigue, night sweats Eyes: Denies redness, blurred vision, diplopia, discharge, itchy, watery eyes.  ENT: Denies congestion, post nasal drip, sore throat, earache, dental pain, Tinnitus, Vertigo, Sinus pain, snoring.  Cardio: Denies chest pain, palpitations, irregular heartbeat, dyspnea, diaphoresis, orthopnea, PND, claudication, edema Respiratory: denies cough, shortness of breath, wheezing.  Gastrointestinal: Denies dysphagia, heartburn, AB pain/ cramps, N/V, diarrhea, constipation, hematemesis, melena, hematochezia,  hemorrhoids Genitourinary: Denies dysuria, frequency, urgency, nocturia, hesitancy, discharge, hematuria, flank pain Musculoskeletal: +  bilateral knee R>L and + right hip pain Denies myalgia and strain/sprain. Skin: Denies pruritis, rash, changing in skin lesion Neuro: Denies Weakness, tremor, incoordination, spasms, pain Psychiatric: Denies confusion, memory loss, sensory loss Endocrine: Denies change in weight, skin, hair change, nocturia Diabetic Polys, Denies visual blurring, hyper /hypo glycemic episodes, and paresthesia, Heme/Lymph: Denies Excessive bleeding, bruising, enlarged lymph nodes  Family history- Review and unchanged Social history- Review and unchanged Physical Exam: Filed Vitals:   07/17/13 1336  BP: 120/68  Pulse: 76  Temp: 98.1 F (36.7 C)  Resp: 16   Filed Weights   07/17/13 1336  Weight: 173 lb (78.472 kg)   General Appearance: Well nourished, in no apparent distress. Eyes: PERRLA, EOMs, conjunctiva no swelling or erythema Sinuses: No Frontal/maxillary tenderness ENT/Mouth: Ext aud canals clear, TMs without erythema, bulging. No erythema, swelling, or exudate on post pharynx.  Tonsils not swollen or erythematous. Hearing normal.  Neck: Supple, thyroid normal.  Respiratory: Respiratory effort normal, BS equal bilaterally without rales, rhonchi, wheezing or stridor.  Cardio: RRR with no MRGs. Brisk peripheral pulses without edema.  Abdomen: Soft, + BS.  Non tender, no guarding, rebound, hernias, masses. Lymphatics: Non tender without lymphadenopathy.  Musculoskeletal: Full ROM, 4/5 strength, antalgic gait.  Skin: Warm, dry without rashes, lesions, ecchymosis.  Neuro: Cranial nerves intact. Normal muscle tone, no cerebellar symptoms. Sensation intact.  Psych: Awake and oriented X 3, normal affect, Insight and Judgment appropriate.   Assessment and Plan:  Hypertension: Continue medication, monitor blood pressure at home. Continue DASH diet. Cholesterol: Continue diet and exercise. Check cholesterol.  Pre-diabetes-Continue diet and exercise. Check A1C Vitamin D Def- check level and continue  medications.  OA- suggest water therapy- patient is unable to do regular PT due to pain/discomfort- will try to see if there is water therapy she can do.   Continue diet and meds as discussed. Further disposition pending results of labs.  [name] 1:46 PM

## 2013-07-18 LAB — INSULIN, FASTING: Insulin fasting, serum: 29 u[IU]/mL — ABNORMAL HIGH (ref 3–28)

## 2013-07-25 ENCOUNTER — Other Ambulatory Visit: Payer: Self-pay | Admitting: Internal Medicine

## 2013-07-25 ENCOUNTER — Other Ambulatory Visit: Payer: Self-pay | Admitting: Physician Assistant

## 2013-08-18 ENCOUNTER — Other Ambulatory Visit: Payer: Self-pay | Admitting: Internal Medicine

## 2013-10-09 ENCOUNTER — Ambulatory Visit (INDEPENDENT_AMBULATORY_CARE_PROVIDER_SITE_OTHER): Payer: Medicare Other | Admitting: Internal Medicine

## 2013-10-09 ENCOUNTER — Encounter: Payer: Self-pay | Admitting: Internal Medicine

## 2013-10-09 VITALS — BP 108/68 | HR 72 | Temp 97.9°F | Resp 16 | Ht 65.5 in | Wt 169.4 lb

## 2013-10-09 DIAGNOSIS — E559 Vitamin D deficiency, unspecified: Secondary | ICD-10-CM

## 2013-10-09 DIAGNOSIS — E785 Hyperlipidemia, unspecified: Secondary | ICD-10-CM

## 2013-10-09 DIAGNOSIS — Z1212 Encounter for screening for malignant neoplasm of rectum: Secondary | ICD-10-CM

## 2013-10-09 DIAGNOSIS — I10 Essential (primary) hypertension: Secondary | ICD-10-CM | POA: Diagnosis not present

## 2013-10-09 DIAGNOSIS — Z1331 Encounter for screening for depression: Secondary | ICD-10-CM

## 2013-10-09 DIAGNOSIS — R7303 Prediabetes: Secondary | ICD-10-CM

## 2013-10-09 DIAGNOSIS — Z789 Other specified health status: Secondary | ICD-10-CM

## 2013-10-09 DIAGNOSIS — R7309 Other abnormal glucose: Secondary | ICD-10-CM | POA: Diagnosis not present

## 2013-10-09 DIAGNOSIS — Z79899 Other long term (current) drug therapy: Secondary | ICD-10-CM | POA: Diagnosis not present

## 2013-10-09 LAB — CBC WITH DIFFERENTIAL/PLATELET
Basophils Absolute: 0 10*3/uL (ref 0.0–0.1)
Basophils Relative: 0 % (ref 0–1)
Eosinophils Absolute: 0.2 10*3/uL (ref 0.0–0.7)
Eosinophils Relative: 2 % (ref 0–5)
HCT: 37.6 % (ref 36.0–46.0)
Hemoglobin: 12.7 g/dL (ref 12.0–15.0)
Lymphocytes Relative: 22 % (ref 12–46)
Lymphs Abs: 1.9 10*3/uL (ref 0.7–4.0)
MCH: 30.2 pg (ref 26.0–34.0)
MCHC: 33.8 g/dL (ref 30.0–36.0)
MCV: 89.3 fL (ref 78.0–100.0)
Monocytes Absolute: 0.9 10*3/uL (ref 0.1–1.0)
Monocytes Relative: 11 % (ref 3–12)
Neutro Abs: 5.5 10*3/uL (ref 1.7–7.7)
Neutrophils Relative %: 65 % (ref 43–77)
Platelets: 324 10*3/uL (ref 150–400)
RBC: 4.21 MIL/uL (ref 3.87–5.11)
RDW: 15.2 % (ref 11.5–15.5)
WBC: 8.5 10*3/uL (ref 4.0–10.5)

## 2013-10-09 NOTE — Progress Notes (Signed)
Patient ID: Debra Short, female   DOB: 1941/02/02, 73 y.o.   MRN: [id number]   Annual Screening Comprehensive Examination  This very nice 73 y.o. [facility] presents for complete physical.  Patient has been followed for HTN,  Prediabetes, Hyperlipidemia, GERD and Vitamin D Deficiency. She states she recently was recommended a right TKR by Dr Legrand Como at [place], but she is anticipating getting a 2sd opinion as she is concerned that her pain may be from her back or hip.    HTN predates since Dec 2012.  Patient's BP has been controlled at home. Today's BP: 108/68 mmHg. Patient denies any cardiac symptoms as chest pain, palpitations, shortness of breath, dizziness or ankle swelling.   Patient's hyperlipidemia is controlled with diet and medications. Patient denies myalgias or other medication SE's. Last cholesterol last visit was  219, triglycerides226, HDL 47 and LDL 127.     Patient has prediabetes with A1c 5.9%  predating since 2009  with last A1c 5.8% in Oct 2014. Patient denies reactive hypoglycemic symptoms, visual blurring, diabetic polys, or paresthesias.    Finally, patient has history of Vitamin D Deficiency of 25 in 2009 5 with last vitamin D up to 59 in Oct 2014.  Medication Sig  . ALPRAZolam (XANAX) 1 MG tablet TAKE 1 TO 2 TABLETS DAILY AS NEEDED FOR ANXIETY  . aspirin 81 MG chewable tablet Chew by mouth daily.  Marland Kitchen atorvastatin (LIPITOR) 80 MG tablet TAKE 1 TABLET  DAILY FOR CHLOESTEROL.  Marland Kitchen bisoprolol-hydrochlorothiazide (ZIAC) 2.5-6.25  Take 1 tablet by mouth daily.  . celecoxib (CELEBREX) 200 MG capsule Take 200 mg by mouth every other day.   . Cholecalciferol (VITAMIN D PO) Take 5,000-10,000 Units by mouth daily. Takes 5,000 units daily, except takes 10,000 units on Mondays, Wednesdays, and Fridays.  . cyclobenzaprine (FLEXERIL) 10 MG tablet Take 1 tablet (10 mg total) by mouth at bedtime.  Marland Kitchen esomeprazole (NEXIUM) 20 MG capsule Take 20 mg by mouth daily at 12 noon.  Marland Kitchen  FLUoxetine (PROZAC) 40 MG capsule TAKE 1 CAPSULE EVERY DAY FOR MOOD  . gabapentin (NEURONTIN) 100 MG capsule Take 100 mg by mouth 2 (two) times daily.   Lebron Quam 5-325 MG per tablet Take 0.5 tablets by mouth daily as needed for moderate pain.   . montelukast (SINGULAIR) 10 MG tablet TAKE 1 TABLET EVERY DAY  . Polyethyl Glycol-Propyl Glycol (SYSTANE OP) Place 1 drop into both eyes daily as needed (for dry eyes).  . triamcinolone / NASACORT nasal inhaler Place 2 sprays into the nose daily as needed (for seasonal allergies).   . zolpidem (AMBIEN) 10 MG tablet Take 5 mg by mouth at bedtime as needed for sleep.    Allergies  Allergen Reactions  . Effexor [Venlafaxine] Other (See Comments)    REACTION:  unknown  . Nortriptyline Other (See Comments)    REACTION:  unknown  . Paxil [Paroxetine Hcl] Other (See Comments)    REACTION:  unknown  . Robaxin [Methocarbamol] Itching  . Zoloft [Sertraline Hcl] Other (See Comments)    REACTION: unknown    Past Medical History  Diagnosis Date  . Hyperlipidemia   . Hypertension   . Arthritis   . GERD (gastroesophageal reflux disease)   . Vitamin D deficiency   . IBS (irritable bowel syndrome)   . Prediabetes   . Large cell lymphoma     PSHx- Colonoscopy in 2007 & 20013. 2011 - had I&D of peri rectal abcess  FHx - non contributory  History  Substance Use Topics  . Smoking status: Never Smoker   . Smokeless tobacco: Not on file  . Alcohol Use: No    ROS Constitutional: Denies fever, chills, weight loss/gain, headaches, insomnia, fatigue, night sweats, and change in appetite. Eyes: Denies redness, blurred vision, diplopia, discharge, itchy, watery eyes.  ENT: Denies discharge, congestion, post nasal drip, epistaxis, sore throat, earache, hearing loss, dental pain, Tinnitus, Vertigo, Sinus pain, snoring.  Cardio: Denies chest pain, palpitations, irregular heartbeat, syncope, dyspnea, diaphoresis, orthopnea, PND, claudication,  edema Respiratory: denies cough, dyspnea, DOE, pleurisy, hoarseness, laryngitis, wheezing.  Gastrointestinal: Denies dysphagia, heartburn, reflux, water brash, pain, cramps, nausea, vomiting, bloating, diarrhea, constipation, hematemesis, melena, hematochezia, jaundice, hemorrhoids Genitourinary: Denies dysuria, frequency, urgency, nocturia, hesitancy, discharge, hematuria, flank pain Breast:Breast lumps, nipple discharge, bleeding.  Musculoskeletal: Denies arthralgia, myalgia, stiffness, Jt. Swelling, pain, limp, and strain/sprain. Skin: Denies puritis, rash, hives, warts, acne, eczema, changing in skin lesion Neuro: No weakness, tremor, incoordination, spasms, paresthesia, pain Psychiatric: Denies confusion, memory loss, sensory loss Endocrine: Denies change in weight, skin, hair change, nocturia, and paresthesia, diabetic polys, visual blurring, hyper / hypo glycemic episodes.  Heme/Lymph: No excessive bleeding, bruising, enlarged lymph nodes.   Physical Exam  BP 108/68  Pulse 72  Temp(Src) 97.9 F (36.6 C) (Temporal)  Resp 16  Ht 5' 5.5" (1.664 m)  Wt 169 lb 6.4 oz (76.839 kg)  BMI 27.75 kg/m2  General Appearance: Well nourished, in no apparent distress. Eyes: PERRLA, EOMs, conjunctiva no swelling or erythema, normal fundi and vessels. Sinuses: No frontal/maxillary tenderness ENT/Mouth: EACs patent / TMs  nl. Nares clear without erythema, swelling, mucoid exudates. Oral hygiene is good. No erythema, swelling, or exudate. Tongue normal, non-obstructing. Tonsils not swollen or erythematous. Hearing normal.  Neck: Supple, thyroid normal. No bruits, nodes or JVD. Respiratory: Respiratory effort normal.  BS equal and clear bilateral without rales, rhonci, wheezing or stridor. Cardio: Heart sounds are normal with regular rate and rhythm and no murmurs, rubs or gallops. Peripheral pulses are normal and equal bilaterally without edema. No aortic or femoral bruits. Chest: symmetric with  normal excursions and percussion. Breasts: Symmetric, without lumps, nipple discharge, retractions, or fibrocystic changes.  Abdomen: Flat, soft, with bowl sounds. Nontender, no guarding, rebound, hernias, masses, or organomegaly.  Lymphatics: Non tender without lymphadenopathy.  Genitourinary:  Musculoskeletal: Full ROM all peripheral extremities, joint stability, 5/5 strength, and normal gait. Skin: Warm and dry without rashes, lesions, cyanosis, clubbing or  ecchymosis.  Neuro: Cranial nerves intact, reflexes equal bilaterally. Normal muscle tone, no cerebellar symptoms. Sensation intact.  Pysch: Awake and oriented X 3, normal affect, Insight and Judgment appropriate.   Assessment and Plan  1. Annual Screening Examination 2. Hypertension  3. Hyperlipidemia 4. Pre Diabetes 5. Vitamin D Deficiency  Continue prudent diet as discussed, weight control, BP monitoring, regular exercise, and medications. Discussed med's effects and SE's. Screening labs and tests as requested with regular follow-up as recommended.

## 2013-10-09 NOTE — Patient Instructions (Signed)

## 2013-10-10 LAB — TSH: TSH: 1.123 u[IU]/mL (ref 0.350–4.500)

## 2013-10-10 LAB — BASIC METABOLIC PANEL WITH GFR
BUN: 19 mg/dL (ref 6–23)
CO2: 30 mEq/L (ref 19–32)
Calcium: 10 mg/dL (ref 8.4–10.5)
Chloride: 96 mEq/L (ref 96–112)
Creat: 0.71 mg/dL (ref 0.50–1.10)
GFR, Est African American: >89 mL/min
GFR, Est Non African American: 84 mL/min
Glucose, Bld: 98 mg/dL (ref 70–99)
Potassium: 5 mEq/L (ref 3.5–5.3)
Sodium: 139 mEq/L (ref 135–145)

## 2013-10-10 LAB — HEPATIC FUNCTION PANEL
ALT: 16 U/L (ref 0–35)
AST: 18 U/L (ref 0–37)
Albumin: 4.4 g/dL (ref 3.5–5.2)
Alkaline Phosphatase: 62 U/L (ref 39–117)
Bilirubin, Direct: 0.1 mg/dL (ref 0.0–0.3)
Indirect Bilirubin: 0.5 mg/dL (ref 0.2–1.2)
Total Bilirubin: 0.6 mg/dL (ref 0.2–1.2)
Total Protein: 7.1 g/dL (ref 6.0–8.3)

## 2013-10-10 LAB — URINALYSIS, MICROSCOPIC ONLY
Bacteria, UA: NONE SEEN
Casts: NONE SEEN
Crystals: NONE SEEN

## 2013-10-10 LAB — LIPID PANEL
Cholesterol: 133 mg/dL (ref 0–200)
HDL: 47 mg/dL (ref >39)
LDL Cholesterol: 61 mg/dL (ref 0–99)
Total CHOL/HDL Ratio: 2.8 Ratio
Triglycerides: 126 mg/dL (ref <150)
VLDL: 25 mg/dL (ref 0–40)

## 2013-10-10 LAB — HEMOGLOBIN A1C
Hgb A1c MFr Bld: 6.1 % — ABNORMAL HIGH (ref <5.7)
Mean Plasma Glucose: 128 mg/dL — ABNORMAL HIGH (ref <117)

## 2013-10-10 LAB — MICROALBUMIN / CREATININE URINE RATIO
Creatinine, Urine: 122.6 mg/dL
Microalb Creat Ratio: 6.4 mg/g (ref 0.0–30.0)
Microalb, Ur: 0.79 mg/dL (ref 0.00–1.89)

## 2013-10-10 LAB — INSULIN, FASTING: Insulin fasting, serum: 30 u[IU]/mL — ABNORMAL HIGH (ref 3–28)

## 2013-10-10 LAB — VITAMIN D 25 HYDROXY (VIT D DEFICIENCY, FRACTURES): Vit D, 25-Hydroxy: 63 ng/mL (ref 30–89)

## 2013-10-10 LAB — MAGNESIUM: Magnesium: 2.3 mg/dL (ref 1.5–2.5)

## 2013-10-11 ENCOUNTER — Other Ambulatory Visit: Payer: Self-pay | Admitting: Physician Assistant

## 2013-10-11 DIAGNOSIS — G43019 Migraine without aura, intractable, without status migrainosus: Secondary | ICD-10-CM | POA: Diagnosis not present

## 2013-10-15 ENCOUNTER — Other Ambulatory Visit: Payer: Self-pay | Admitting: *Deleted

## 2013-10-15 MED ORDER — CYCLOBENZAPRINE HCL 10 MG PO TABS: 10.0000 mg | ORAL_TABLET | Freq: Every day | ORAL | Status: DC

## 2013-10-24 DIAGNOSIS — M47817 Spondylosis without myelopathy or radiculopathy, lumbosacral region: Secondary | ICD-10-CM | POA: Diagnosis not present

## 2013-10-24 DIAGNOSIS — M545 Low back pain, unspecified: Secondary | ICD-10-CM | POA: Diagnosis not present

## 2013-10-28 ENCOUNTER — Other Ambulatory Visit: Payer: Self-pay | Admitting: Emergency Medicine

## 2013-11-11 DIAGNOSIS — H00019 Hordeolum externum unspecified eye, unspecified eyelid: Secondary | ICD-10-CM | POA: Diagnosis not present

## 2013-11-13 DIAGNOSIS — H00019 Hordeolum externum unspecified eye, unspecified eyelid: Secondary | ICD-10-CM | POA: Diagnosis not present

## 2013-12-03 DIAGNOSIS — IMO0001 Reserved for inherently not codable concepts without codable children: Secondary | ICD-10-CM | POA: Diagnosis not present

## 2013-12-03 DIAGNOSIS — R5383 Other fatigue: Secondary | ICD-10-CM | POA: Diagnosis not present

## 2013-12-03 DIAGNOSIS — G4700 Insomnia, unspecified: Secondary | ICD-10-CM | POA: Diagnosis not present

## 2013-12-03 DIAGNOSIS — M5137 Other intervertebral disc degeneration, lumbosacral region: Secondary | ICD-10-CM | POA: Diagnosis not present

## 2013-12-03 DIAGNOSIS — M1710 Unilateral primary osteoarthritis, unspecified knee: Secondary | ICD-10-CM | POA: Diagnosis not present

## 2013-12-03 DIAGNOSIS — R5381 Other malaise: Secondary | ICD-10-CM | POA: Diagnosis not present

## 2013-12-06 ENCOUNTER — Telehealth: Payer: Self-pay

## 2013-12-06 NOTE — Telephone Encounter (Signed)
Patient called requesting lab results from last office visit be sent to Dr. Estanislado Pandy. Faxed results to Dr.Deveshwar [phone]

## 2014-01-12 ENCOUNTER — Other Ambulatory Visit: Payer: Self-pay | Admitting: Internal Medicine

## 2014-01-29 ENCOUNTER — Other Ambulatory Visit: Payer: Self-pay | Admitting: Physician Assistant

## 2014-02-05 ENCOUNTER — Telehealth: Payer: Self-pay | Admitting: *Deleted

## 2014-02-05 NOTE — Telephone Encounter (Signed)
Patient called and requested last ov and labs be faxed to Dr Estanislado Pandy.  Labs ware faxed to [phone].

## 2014-02-10 DIAGNOSIS — Z0389 Encounter for observation for other suspected diseases and conditions ruled out: Secondary | ICD-10-CM | POA: Diagnosis not present

## 2014-02-10 DIAGNOSIS — Z79899 Other long term (current) drug therapy: Secondary | ICD-10-CM | POA: Diagnosis not present

## 2014-02-11 ENCOUNTER — Ambulatory Visit: Payer: Self-pay | Admitting: Physician Assistant

## 2014-02-12 DIAGNOSIS — G43719 Chronic migraine without aura, intractable, without status migrainosus: Secondary | ICD-10-CM | POA: Diagnosis not present

## 2014-02-12 DIAGNOSIS — G43019 Migraine without aura, intractable, without status migrainosus: Secondary | ICD-10-CM | POA: Diagnosis not present

## 2014-02-24 ENCOUNTER — Other Ambulatory Visit: Payer: Self-pay | Admitting: Internal Medicine

## 2014-03-17 ENCOUNTER — Other Ambulatory Visit: Payer: Self-pay | Admitting: Emergency Medicine

## 2014-03-24 ENCOUNTER — Encounter: Payer: Self-pay | Admitting: Physician Assistant

## 2014-03-24 ENCOUNTER — Ambulatory Visit (INDEPENDENT_AMBULATORY_CARE_PROVIDER_SITE_OTHER): Payer: Medicare Other | Admitting: Physician Assistant

## 2014-03-24 VITALS — BP 128/64 | HR 84 | Temp 98.1°F | Resp 16 | Ht 65.5 in | Wt 173.0 lb

## 2014-03-24 DIAGNOSIS — E785 Hyperlipidemia, unspecified: Secondary | ICD-10-CM | POA: Diagnosis not present

## 2014-03-24 DIAGNOSIS — C8580 Other specified types of non-Hodgkin lymphoma, unspecified site: Secondary | ICD-10-CM

## 2014-03-24 DIAGNOSIS — Z79899 Other long term (current) drug therapy: Secondary | ICD-10-CM

## 2014-03-24 DIAGNOSIS — I10 Essential (primary) hypertension: Secondary | ICD-10-CM | POA: Diagnosis not present

## 2014-03-24 DIAGNOSIS — Z0001 Encounter for general adult medical examination with abnormal findings: Secondary | ICD-10-CM | POA: Diagnosis not present

## 2014-03-24 DIAGNOSIS — M5441 Lumbago with sciatica, right side: Secondary | ICD-10-CM

## 2014-03-24 DIAGNOSIS — R296 Repeated falls: Secondary | ICD-10-CM | POA: Diagnosis not present

## 2014-03-24 DIAGNOSIS — R6889 Other general symptoms and signs: Secondary | ICD-10-CM | POA: Diagnosis not present

## 2014-03-24 DIAGNOSIS — Z23 Encounter for immunization: Secondary | ICD-10-CM

## 2014-03-24 DIAGNOSIS — K589 Irritable bowel syndrome without diarrhea: Secondary | ICD-10-CM

## 2014-03-24 DIAGNOSIS — R7303 Prediabetes: Secondary | ICD-10-CM

## 2014-03-24 DIAGNOSIS — K219 Gastro-esophageal reflux disease without esophagitis: Secondary | ICD-10-CM

## 2014-03-24 DIAGNOSIS — Z1331 Encounter for screening for depression: Secondary | ICD-10-CM

## 2014-03-24 DIAGNOSIS — M5442 Lumbago with sciatica, left side: Secondary | ICD-10-CM

## 2014-03-24 DIAGNOSIS — Z9181 History of falling: Secondary | ICD-10-CM

## 2014-03-24 DIAGNOSIS — R7309 Other abnormal glucose: Secondary | ICD-10-CM

## 2014-03-24 DIAGNOSIS — E2839 Other primary ovarian failure: Secondary | ICD-10-CM

## 2014-03-24 DIAGNOSIS — E559 Vitamin D deficiency, unspecified: Secondary | ICD-10-CM

## 2014-03-24 LAB — CBC WITH DIFFERENTIAL/PLATELET
Basophils Absolute: 0.1 10*3/uL (ref 0.0–0.1)
Basophils Relative: 1 % (ref 0–1)
Eosinophils Absolute: 0.2 10*3/uL (ref 0.0–0.7)
Eosinophils Relative: 4 % (ref 0–5)
HCT: 37.3 % (ref 36.0–46.0)
Hemoglobin: 12.4 g/dL (ref 12.0–15.0)
Lymphocytes Relative: 27 % (ref 12–46)
Lymphs Abs: 1.6 10*3/uL (ref 0.7–4.0)
MCH: 29.5 pg (ref 26.0–34.0)
MCHC: 33.2 g/dL (ref 30.0–36.0)
MCV: 88.8 fL (ref 78.0–100.0)
Monocytes Absolute: 0.7 10*3/uL (ref 0.1–1.0)
Monocytes Relative: 11 % (ref 3–12)
Neutro Abs: 3.5 10*3/uL (ref 1.7–7.7)
Neutrophils Relative %: 57 % (ref 43–77)
Platelets: 309 10*3/uL (ref 150–400)
RBC: 4.2 MIL/uL (ref 3.87–5.11)
RDW: 14.5 % (ref 11.5–15.5)
WBC: 6.1 10*3/uL (ref 4.0–10.5)

## 2014-03-24 LAB — BASIC METABOLIC PANEL WITH GFR
BUN: 13 mg/dL (ref 6–23)
CO2: 29 mEq/L (ref 19–32)
Calcium: 9.8 mg/dL (ref 8.4–10.5)
Chloride: 100 mEq/L (ref 96–112)
Creat: 0.77 mg/dL (ref 0.50–1.10)
GFR, Est African American: 87 mL/min
GFR, Est Non African American: 76 mL/min
Glucose, Bld: 105 mg/dL — ABNORMAL HIGH (ref 70–99)
Potassium: 4.6 mEq/L (ref 3.5–5.3)
Sodium: 139 mEq/L (ref 135–145)

## 2014-03-24 LAB — MAGNESIUM: Magnesium: 2.1 mg/dL (ref 1.5–2.5)

## 2014-03-24 LAB — LIPID PANEL
Cholesterol: 195 mg/dL (ref 0–200)
HDL: 47 mg/dL (ref >39)
LDL Cholesterol: 99 mg/dL (ref 0–99)
Total CHOL/HDL Ratio: 4.1 Ratio
Triglycerides: 244 mg/dL — ABNORMAL HIGH (ref <150)
VLDL: 49 mg/dL — ABNORMAL HIGH (ref 0–40)

## 2014-03-24 LAB — HEPATIC FUNCTION PANEL
ALT: 17 U/L (ref 0–35)
AST: 21 U/L (ref 0–37)
Albumin: 4.4 g/dL (ref 3.5–5.2)
Alkaline Phosphatase: 68 U/L (ref 39–117)
Bilirubin, Direct: 0.1 mg/dL (ref 0.0–0.3)
Indirect Bilirubin: 0.6 mg/dL (ref 0.2–1.2)
Total Bilirubin: 0.7 mg/dL (ref 0.2–1.2)
Total Protein: 7 g/dL (ref 6.0–8.3)

## 2014-03-24 NOTE — Progress Notes (Signed)
MEDICARE ANNUAL WELLNESS VISIT AND FOLLOW UP  Assessment:   1. Need for prophylactic vaccination and inoculation against influenza - Flu vaccine HIGH DOSE PF  2. Essential hypertension - CBC with Differential - BASIC METABOLIC PANEL WITH GFR - Hepatic function panel - TSH  3. Gastroesophageal reflux disease without esophagitis Diet controlled  4. Prediabetes Discussed general issues about diabetes pathophysiology and management., Educational material distributed., Suggested low cholesterol diet., Encouraged aerobic exercise., Discussed foot care., Reminded to get yearly retinal exam. - HM DIABETES FOOT EXAM - Hemoglobin A1c  5. Hyperlipidemia ? Increasing myalgias from lipitor, stop for 2 weeks and then restart 1/2 3 days a week - Lipid panel  6. 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 Encounter for long-term (current) use of medications - Magnesium  9. IBS (irritable bowel syndrome) Diet controlled  10. Need for vaccination with 13-polyvalent pneumococcal conjugate vaccine - Pneumococcal conjugate vaccine 13-valent IM  11. At high risk for falls - Ambulatory referral to Physical Therapy  12. Estrogen deficiency - DG Bone Density; Future  13. Bilateral low back pain with sciatica, sciatica laterality unspecified Continue follow up with Dr. Ernestina Patches - Ambulatory referral to Physical Therapy  14. Depression screen negative   Plan:   During the course of the visit the patient was educated and counseled about appropriate screening and preventive services including:    Pneumococcal vaccine   Influenza vaccine  Td vaccine  Screening electrocardiogram  Screening mammography  Bone densitometry screening  Colorectal cancer screening  Diabetes screening  Glaucoma screening  Nutrition counseling   Advanced directives: given info/requested  Screening recommendations, referrals:  Vaccinations: Please see  documentation below and orders this visit.   Nutrition assessed and recommended  Colonoscopy due 2018 Mammogram requested Pap smear not indicated Pelvic exam not indicated Recommended yearly ophthalmology/optometry visit for glaucoma screening and checkup Recommended yearly dental visit for hygiene and checkup Advanced directives - requested  Conditions/risks identified: BMI: Discussed weight loss, diet, and increase physical activity.  Increase physical activity: AHA recommends 150 minutes of physical activity a week.  Medications reviewed DEXA- ordered Urinary Incontinence is not an issue: discussed non pharmacology and pharmacology options.  Fall risk: high- discussed PT, home fall assessment, medications.    Subjective:   Debra Short is a 73 y.o. female who presents for Medicare Annual Wellness Visit and 3 month follow up on hypertension, prediabetes, hyperlipidemia, vitamin D def.  Date of last medicare wellness visit is unknown.   Her blood pressure has been controlled at home, today their BP is BP: 128/64 mmHg She does not workout. She denies chest pain, shortness of breath, dizziness.  She is on cholesterol medication and denies myalgias. Her cholesterol is at goal. The cholesterol last visit was:   Lab Results  Component Value Date   CHOL 133 10/09/2013   HDL 47 10/09/2013   LDLCALC 61 10/09/2013   TRIG 126 10/09/2013   CHOLHDL 2.8 10/09/2013   She has been working on diet and exercise for prediabetes, and denies polydipsia, polyuria and visual disturbances. Last A1C in the office was:  Lab Results  Component Value Date   HGBA1C 6.1* 10/09/2013   Patient is on Vitamin D supplement. Lab Results  Component Value Date   VD25OH 34 10/09/2013     Got a recent ESI with Dr. Ernestina Patches for her back, states it helps some but she continues to have bilateral muscle pain, OA, and is very unsteady, She has  fallen multiple times, no fractures.   Names of Other  Physician/Practitioners you currently use: 1. [facility]- here for primary care 2. Cant recall name, eye doctor, last visit 1 year 3. unknown, dentist, last visit q 6 months Patient Care Team: Unk [name], MD as PCP - General (Internal Medicine) Bo Merino, MD as Consulting Physician (Rheumatology) Winfield Cunas., MD as Consulting Physician (Gastroenterology) Michelene Gardener., MD (Dermatology) Dr. Domingo Cocking, neurology Dr. Ernestina Patches, ortho  Medication Review Current Outpatient Prescriptions on File Prior to Visit  Medication Sig Dispense Refill  . ALPRAZolam (XANAX) 1 MG tablet TAKE 1-2 TABLETS ONCE DAILY AS NEEDED  60 tablet  0  . aspirin 81 MG chewable tablet Chew by mouth daily.      Marland Kitchen atorvastatin (LIPITOR) 80 MG tablet TAKE 1 TABLET BY MOUTH DAILY FOR CHLOESTEROL. DISCONTINUE PRAVASTATIN  30 tablet  3  . bisoprolol-hydrochlorothiazide (ZIAC) 2.5-6.25 MG per tablet Take 1 tablet by mouth daily.      . bisoprolol-hydrochlorothiazide (ZIAC) 5-6.25 MG per tablet TAKE 1 TABLET BY MOUTH DAILY  90 tablet  1  . celecoxib (CELEBREX) 200 MG capsule Take 200 mg by mouth every other day.       . Cholecalciferol (VITAMIN D PO) Take 5,000-10,000 Units by mouth daily. Takes 5,000 units daily, except takes 10,000 units on Mondays, Wednesdays, and Fridays.      . cyclobenzaprine (FLEXERIL) 10 MG tablet TAKE ONE TABLET BY MOUTH NIGHTLY AT BEDTIME   30 tablet  1  . esomeprazole (NEXIUM) 20 MG capsule Take 20 mg by mouth daily at 12 noon.      Marland Kitchen FLUoxetine (PROZAC) 40 MG capsule TAKE 1 CAPSULE EVERY DAY FOR MOOD  90 capsule  2  . gabapentin (NEURONTIN) 100 MG capsule Take 100 mg by mouth 2 (two) times daily.       Marland Kitchen HYDROcodone-acetaminophen (NORCO/VICODIN) 5-325 MG per tablet Take 0.5 tablets by mouth daily as needed for moderate pain.       . montelukast (SINGULAIR) 10 MG tablet TAKE 1 TABLET EVERY DAY  90 tablet  1  . Polyethyl Glycol-Propyl Glycol  (SYSTANE OP) Place 1 drop into both eyes daily as needed (for dry eyes).      . triamcinolone (NASACORT) 55 MCG/ACT AERO nasal inhaler Place 2 sprays into the nose daily as needed (for seasonal allergies).       . zolpidem (AMBIEN) 10 MG tablet Take 5 mg by mouth at bedtime as needed for sleep.       No current facility-administered medications on file prior to visit.    Current Problems (verified) Patient Active Problem List   Diagnosis Date Noted  . Encounter for long-term (current) use of medications 10/09/2013  . Hyperlipidemia   . Hypertension   . Arthritis   . GERD (gastroesophageal reflux disease)   . Vitamin D deficiency   . IBS (irritable bowel syndrome)   . Prediabetes   . Large cell lymphoma     Screening Tests Health Maintenance  Topic Date Due  . Mammogram  08/07/2014  . Influenza Vaccine  01/12/2015  . Colonoscopy  08/07/2021  . Tetanus/tdap  09/25/2022  . Pneumococcal Polysaccharide Vaccine Age 47 And Over  Completed  . Zostavax  Completed     Immunization History  Administered Date(s) Administered  . Influenza Split 04/10/2013  . Influenza, High Dose Seasonal PF 03/24/2014  . Pneumococcal Polysaccharide-23 06/13/2008  . Td 09/24/2012  . Zoster 02/23/2011  Preventative care: Last colonoscopy: 2013 due 2018 Last mammogram: 2014 Last pap smear/pelvic exam: 2010   DEXA:2007 osteopenia Cardiolite normal 2008 EF 65% Carotid dopplers 2008 60-70%   Prior vaccinations: TD or Tdap: 2014  Influenza: 2015 Pneumococcal: 2010 Shingles/Zostavax: 2012  History reviewed: allergies, current medications, past family history, past medical history, past social history, past surgical history and problem list  Risk Factors: Osteoporosis/FallRisk: postmenopausal estrogen deficiency and dietary calcium and/or vitamin D deficiency In the past year have you fallen or had a near fall?:Yes History of fracture in the past year: no  Tobacco History  Substance Use  Topics  . Smoking status: Never Smoker   . Smokeless tobacco: Not on file  . Alcohol Use: No   She does not smoke.  Patient is not a former smoker. Are there smokers in your home (other than you)?  No  Alcohol Current alcohol use: none  Caffeine Current caffeine use: denies use  Exercise Current exercise: housecleaning, no regular exercise and yard work  Nutrition/Diet Current diet: in general, a "healthy" diet    Cardiac risk factors: advanced age (older than 3 for men, 17 for women), dyslipidemia, hypertension and sedentary lifestyle.  Depression Screen (Note: if answer to either of the following is "Yes", a more complete depression screening is indicated)   Q1: Over the past two weeks, have you felt down, depressed or hopeless? No  Q2: Over the past two weeks, have you felt little interest or pleasure in doing things? No  Have you lost interest or pleasure in daily life? No  Do you often feel hopeless? No  Do you cry easily over simple problems? No  Activities of Daily Living In your present state of health, do you have any difficulty performing the following activities?:  Driving? No Managing money?  No Feeding yourself? No Getting from bed to chair? No Climbing a flight of stairs? Yes Preparing food and eating?: No Bathing or showering? No Getting dressed: No Getting to the toilet? No Using the toilet:No Moving around from place to place: No   Are you sexually active?  No  Do you have more than one partner?  No  Vision Difficulties: No  Hearing Difficulties: Yes Do you often ask people to speak up or repeat themselves? Yes Do you experience ringing or noises in your ears? No Do you have difficulty understanding soft or whispered voices? Yes  Cognition  Do you feel that you have a problem with memory?Yes  Do you often misplace items? No  Do you feel safe at home?  Yes  Advanced directives Does patient have a [place]? Yes Does  patient have a Living Will? Yes   Objective:   Blood pressure 128/64, pulse 84, temperature 98.1 F (36.7 C), resp. rate 16, height 5' 5.5" (1.664 m), weight 173 lb (78.472 kg). Body mass index is 28.34 kg/(m^2).  General appearance: alert, no distress, WD/WN,  female Cognitive Testing  Alert? Yes  Normal Appearance?Yes  Oriented to person? Yes  Place? Yes   Time? Yes  Recall of three objects?  Yes  Can perform simple calculations? Yes  Displays appropriate judgment?Yes  Can read the correct time from a watch face?Yes  HEENT: normocephalic, sclerae anicteric, TMs pearly, nares patent, no discharge or erythema, pharynx normal Oral cavity: MMM, no lesions Neck: supple, no lymphadenopathy, no thyromegaly, no masses Heart: RRR, normal S1, S2, no murmurs Lungs: CTA bilaterally, no wheezes, rhonchi, or rales Abdomen: +bs, soft, non tender, non  distended, no masses, no hepatomegaly, no splenomegaly Musculoskeletal: nontender, no swelling, no obvious deformity Extremities: no edema, no cyanosis, no clubbing Pulses: 2+ symmetric, upper and lower extremities, normal cap refill Neurological: alert, oriented x 3, CN2-12 intact, strength normal upper extremities and lower extremities, sensation normal throughout, DTRs 2+ throughout, no cerebellar signs, gait antalgic/unsteady Psychiatric: normal affect, behavior normal, pleasant  Breast: defer Gyn: defer Rectal: defer  Medicare Attestation I have personally reviewed: The patient's medical and social history Their use of alcohol, tobacco or illicit drugs Their current medications and supplements The patient's functional ability including ADLs,fall risks, home safety risks, cognitive, and hearing and visual impairment Diet and physical activities Evidence for depression or mood disorders  The patient's weight, height, BMI, and visual acuity have been recorded in the chart.  I have made referrals, counseling, and provided education to the  patient based on review of the above and I have provided the patient with a written personalized care plan for preventive services.     [name], PA-C   03/24/2014

## 2014-03-24 NOTE — Patient Instructions (Addendum)
I want you to stop the atrovastatin for 2 week to see if you muscle pain gets better, you can than restart it 1/2 pill ONLY on Monday, Wednesday and Friday.   We are going to order a bone density on you to look at the strength of your bones.   Suggest physical therapy for pain and fall risk.   Preventative Care for Adults - Female      MAINTAIN REGULAR HEALTH EXAMS:  A routine yearly physical is a good way to check in with your primary care provider about your health and preventive screening. It is also an opportunity to share updates about your health and any concerns you have, and receive a thorough all-over exam.   Most health insurance companies pay for at least some preventative services.  Check with your health plan for specific coverages.  WHAT PREVENTATIVE SERVICES DO WOMEN NEED?  Adult women should have their weight and blood pressure checked regularly.   Women age 67 and older should have their cholesterol levels checked regularly.  Women should be screened for cervical cancer with a Pap smear and pelvic exam beginning at either age 73, or 3 years after they become sexually activity.    Breast cancer screening generally begins at age 43 with a mammogram and breast exam by your primary care provider.    Beginning at age 24 and continuing to age 64, women should be screened for colorectal cancer.  Certain people may need continued testing until age 66.  Updating vaccinations is part of preventative care.  Vaccinations help protect against diseases such as the flu.  Osteoporosis is a disease in which the bones lose minerals and strength as we age. Women ages 108 and over should discuss this with their caregivers, as should women after menopause who have other risk factors.  Lab tests are generally done as part of preventative care to screen for anemia and blood disorders, to screen for problems with the kidneys and liver, to screen for bladder problems, to check blood sugar, and  to check your cholesterol level.  Preventative services generally include counseling about diet, exercise, avoiding tobacco, drugs, excessive alcohol consumption, and sexually transmitted infections.    GENERAL RECOMMENDATIONS FOR GOOD HEALTH:  Healthy diet:  Eat a variety of foods, including fruit, vegetables, animal or vegetable protein, such as meat, fish, chicken, and eggs, or beans, lentils, tofu, and grains, such as rice.  Drink plenty of water daily.  Decrease saturated fat in the diet, avoid lots of red meat, processed foods, sweets, fast foods, and fried foods.  Exercise:  Aerobic exercise helps maintain good heart health. At least 30-40 minutes of moderate-intensity exercise is recommended. For example, a brisk walk that increases your heart rate and breathing. This should be done on most days of the week.   Find a type of exercise or a variety of exercises that you enjoy so that it becomes a part of your daily life.  Examples are running, walking, swimming, water aerobics, and biking.  For motivation and support, explore group exercise such as aerobic class, spin class, Zumba, Yoga,or  martial arts, etc.    Set exercise goals for yourself, such as a certain weight goal, walk or run in a race such as a 5k walk/run.  Speak to your primary care provider about exercise goals.  Disease prevention:  If you smoke or chew tobacco, find out from your caregiver how to quit. It can literally save your life, no matter how long you  have been a tobacco user. If you do not use tobacco, never begin.   Maintain a healthy diet and normal weight. Increased weight leads to problems with blood pressure and diabetes.   The Body Mass Index or BMI is a way of measuring how much of your body is fat. Having a BMI above 27 increases the risk of heart disease, diabetes, hypertension, stroke and other problems related to obesity. Your caregiver can help determine your BMI and based on it develop an  exercise and dietary program to help you achieve or maintain this important measurement at a healthful level.  High blood pressure causes heart and blood vessel problems.  Persistent high blood pressure should be treated with medicine if weight loss and exercise do not work.   Fat and cholesterol leaves deposits in your arteries that can block them. This causes heart disease and vessel disease elsewhere in your body.  If your cholesterol is found to be high, or if you have heart disease or certain other medical conditions, then you may need to have your cholesterol monitored frequently and be treated with medication.   Ask if you should have a cardiac stress test if your history suggests this. A stress test is a test done on a treadmill that looks for heart disease. This test can find disease prior to there being a problem.  Menopause can be associated with physical symptoms and risks. Hormone replacement therapy is available to decrease these. You should talk to your caregiver about whether starting or continuing to take hormones is right for you.   Osteoporosis is a disease in which the bones lose minerals and strength as we age. This can result in serious bone fractures. Risk of osteoporosis can be identified using a bone density scan. Women ages 49 and over should discuss this with their caregivers, as should women after menopause who have other risk factors. Ask your caregiver whether you should be taking a calcium supplement and Vitamin D, to reduce the rate of osteoporosis.   Avoid drinking alcohol in excess (more than two drinks per day).  Avoid use of street drugs. Do not share needles with anyone. Ask for professional help if you need assistance or instructions on stopping the use of alcohol, cigarettes, and/or drugs.  Brush your teeth twice a day with fluoride toothpaste, and floss once a day. Good oral hygiene prevents tooth decay and gum disease. The problems can be painful, unattractive,  and can cause other health problems. Visit your dentist for a routine oral and dental check up and preventive care every 6-12 months.   Look at your skin regularly.  Use a mirror to look at your back. Notify your caregivers of changes in moles, especially if there are changes in shapes, colors, a size larger than a pencil eraser, an irregular border, or development of new moles.  Safety:  Use seatbelts 100% of the time, whether driving or as a passenger.  Use safety devices such as hearing protection if you work in environments with loud noise or significant background noise.  Use safety glasses when doing any work that could send debris in to the eyes.  Use a helmet if you ride a bike or motorcycle.  Use appropriate safety gear for contact sports.  Talk to your caregiver about gun safety.  Use sunscreen with a SPF (or skin protection factor) of 15 or greater.  Lighter skinned people are at a greater risk of skin cancer. Don't forget to also wear sunglasses in  order to protect your eyes from too much damaging sunlight. Damaging sunlight can accelerate cataract formation.   Practice safe sex. Use condoms. Condoms are used for birth control and to help reduce the spread of sexually transmitted infections (or STIs).  Some of the STIs are gonorrhea (the clap), chlamydia, syphilis, trichomonas, herpes, HPV (human papilloma virus) and HIV (human immunodeficiency virus) which causes AIDS. The herpes, HIV and HPV are viral illnesses that have no cure. These can result in disability, cancer and death.   Keep carbon monoxide and smoke detectors in your home functioning at all times. Change the batteries every 6 months or use a model that plugs into the wall.   Vaccinations:  Stay up to date with your tetanus shots and other required immunizations. You should have a booster for tetanus every 10 years. Be sure to get your flu shot every year, since 5%-20% of the [place] population comes down with the flu. The flu  vaccine changes each year, so being vaccinated once is not enough. Get your shot in the fall, before the flu season peaks.   Other vaccines to consider:  Human Papilloma Virus or HPV causes cancer of the cervix, and other infections that can be transmitted from person to person. There is a vaccine for HPV, and females should get immunized between the ages of 24 and 11. It requires a series of 3 shots.   Pneumococcal vaccine to protect against certain types of pneumonia.  This is normally recommended for adults age 42 or older.  However, adults younger than 75 years old with certain underlying conditions such as diabetes, heart or lung disease should also receive the vaccine.  Shingles vaccine to protect against Varicella Zoster if you are older than age 35, or younger than 75 years old with certain underlying illness.  Hepatitis A vaccine to protect against a form of infection of the liver by a virus acquired from food.  Hepatitis B vaccine to protect against a form of infection of the liver by a virus acquired from blood or body fluids, particularly if you work in health care.  If you plan to travel internationally, check with your local health department for specific vaccination recommendations.  Cancer Screening:  Breast cancer screening is essential to preventive care for women. All women age 4 and older should perform a breast self-exam every month. At age 62 and older, women should have their caregiver complete a breast exam each year. Women at ages 32 and older should have a mammogram (x-ray film) of the breasts. Your caregiver can discuss how often you need mammograms.    Cervical cancer screening includes taking a Pap smear (sample of cells examined under a microscope) from the cervix (end of the uterus). It also includes testing for HPV (Human Papilloma Virus, which can cause cervical cancer). Screening and a pelvic exam should begin at age 31, or 3 years after a woman becomes sexually  active. Screening should occur every year, with a Pap smear but no HPV testing, up to age 44. After age 28, you should have a Pap smear every 3 years with HPV testing, if no HPV was found previously.   Most routine colon cancer screening begins at the age of 45. On a yearly basis, doctors may provide special easy to use take-home tests to check for hidden blood in the stool. Sigmoidoscopy or colonoscopy can detect the earliest forms of colon cancer and is life saving. These tests use a small camera at the end  of a tube to directly examine the colon. Speak to your caregiver about this at age 33, when routine screening begins (and is repeated every 5 years unless early forms of pre-cancerous polyps or small growths are found).

## 2014-03-25 LAB — TSH: TSH: 1.86 u[IU]/mL (ref 0.350–4.500)

## 2014-03-25 LAB — HEMOGLOBIN A1C
Hgb A1c MFr Bld: 6.1 % — ABNORMAL HIGH (ref <5.7)
Mean Plasma Glucose: 128 mg/dL — ABNORMAL HIGH (ref <117)

## 2014-03-26 ENCOUNTER — Other Ambulatory Visit: Payer: Self-pay | Admitting: Internal Medicine

## 2014-03-30 ENCOUNTER — Other Ambulatory Visit: Payer: Self-pay | Admitting: Internal Medicine

## 2014-04-21 DIAGNOSIS — M1711 Unilateral primary osteoarthritis, right knee: Secondary | ICD-10-CM | POA: Diagnosis not present

## 2014-04-21 DIAGNOSIS — G4701 Insomnia due to medical condition: Secondary | ICD-10-CM | POA: Diagnosis not present

## 2014-04-21 DIAGNOSIS — M797 Fibromyalgia: Secondary | ICD-10-CM | POA: Diagnosis not present

## 2014-04-21 DIAGNOSIS — M19041 Primary osteoarthritis, right hand: Secondary | ICD-10-CM | POA: Diagnosis not present

## 2014-04-29 ENCOUNTER — Other Ambulatory Visit: Payer: Self-pay | Admitting: Physician Assistant

## 2014-05-01 ENCOUNTER — Other Ambulatory Visit: Payer: Self-pay

## 2014-05-01 ENCOUNTER — Ambulatory Visit: Payer: Self-pay | Admitting: Emergency Medicine

## 2014-05-01 ENCOUNTER — Ambulatory Visit (INDEPENDENT_AMBULATORY_CARE_PROVIDER_SITE_OTHER): Payer: Medicare Other | Admitting: Physician Assistant

## 2014-05-01 ENCOUNTER — Encounter: Payer: Self-pay | Admitting: Physician Assistant

## 2014-05-01 VITALS — BP 130/84 | HR 92 | Temp 97.9°F | Resp 16 | Wt 171.2 lb

## 2014-05-01 DIAGNOSIS — L03115 Cellulitis of right lower limb: Secondary | ICD-10-CM

## 2014-05-01 DIAGNOSIS — L03119 Cellulitis of unspecified part of limb: Secondary | ICD-10-CM

## 2014-05-01 MED ORDER — CEPHALEXIN 500 MG PO CAPS: 500.0000 mg | ORAL_CAPSULE | Freq: Four times a day (QID) | ORAL | Status: AC

## 2014-05-01 MED ORDER — FLUOXETINE HCL 40 MG PO CAPS: ORAL_CAPSULE | ORAL | Status: DC

## 2014-05-01 NOTE — Progress Notes (Addendum)
Subjective:    Patient ID: Debra Short, female    DOB: [birth year], 73 y.o.   MRN: [id number]  Fall Incident onset: Last Monday. The fall occurred while walking (Patient states she was carrying a large box with a heavy lamp inside and it was not secure (so the lamp was rolling around in the box).  She states she lost her coordination with the lamp rolling and fell over the sofa.). Distance fallen: Fell from standing position. She landed on hard floor. The volume of blood lost was minimal. The point of impact was the right elbow and right knee (She denies hitting her head on anything and LOC). The pain is present in the right knee and right elbow. Pain scale: Pain is a 2/3 out of 10 and stretching or walking the pain is a 7 out of 10. Exacerbated by: walking and bending joint. Pertinent negatives include no abdominal pain, bowel incontinence, fever, headaches, hearing loss, hematuria, loss of consciousness, nausea, numbness, tingling, visual change or vomiting. She has tried rest (She states she used Neosporin for one day.  Ace bandage for support) for the symptoms.  GFR= 76 Review of Systems  Constitutional: Negative.  Negative for fever, chills, diaphoresis and fatigue.  HENT: Negative.   Eyes: Negative.   Respiratory: Negative.  Negative for cough, chest tightness, shortness of breath and wheezing.   Cardiovascular: Positive for leg swelling. Negative for chest pain and palpitations.  Gastrointestinal: Negative.  Negative for nausea, vomiting, abdominal pain and bowel incontinence.  Genitourinary: Negative.  Negative for hematuria.  Musculoskeletal: Positive for myalgias.  Skin: Positive for color change and wound.       Two wounds on right leg and wound on right elbow.  Wounds are erythematous and warm to the touch.  Neurological: Negative.  Negative for tingling, loss of consciousness, numbness and headaches.  Psychiatric/Behavioral: Negative.    Past Medical History  Diagnosis Date  .  Hyperlipidemia   . Hypertension   . Arthritis   . GERD (gastroesophageal reflux disease)   . Vitamin D deficiency   . IBS (irritable bowel syndrome)   . Prediabetes   . Large cell lymphoma    Current Outpatient Prescriptions on File Prior to Visit  Medication Sig Dispense Refill  . ALPRAZolam (XANAX) 1 MG tablet TAKE 1-2 TABLETS ONCE DAILY AS NEEDED 60 tablet 3  . aspirin 81 MG chewable tablet Chew by mouth daily.    Marland Kitchen atorvastatin (LIPITOR) 80 MG tablet TAKE 1 TABLET BY MOUTH DAILY FOR CHLOESTEROL. DISCONTINUE PRAVASTATIN 30 tablet 3  . bisoprolol-hydrochlorothiazide (ZIAC) 2.5-6.25 MG per tablet Take 1 tablet by mouth daily.    . bisoprolol-hydrochlorothiazide (ZIAC) 5-6.25 MG per tablet TAKE 1 TABLET BY MOUTH DAILY 90 tablet 1  . celecoxib (CELEBREX) 200 MG capsule Take 200 mg by mouth every other day.     . Cholecalciferol (VITAMIN D PO) Take 5,000-10,000 Units by mouth daily. Takes 5,000 units daily, except takes 10,000 units on Mondays, Wednesdays, and Fridays.    . cyclobenzaprine (FLEXERIL) 10 MG tablet TAKE ONE TABLET BY MOUTH AT BEDTIME  30 tablet 5  . esomeprazole (NEXIUM) 20 MG capsule Take 20 mg by mouth daily at 12 noon.    . gabapentin (NEURONTIN) 300 MG capsule Take 300 mg by mouth 2 (two) times daily.  3  . HYDROcodone-acetaminophen (NORCO/VICODIN) 5-325 MG per tablet Take 0.5 tablets by mouth daily as needed for moderate pain.     . montelukast (SINGULAIR) 10 MG tablet TAKE 1  TABLET EVERY DAY 90 tablet 1  . Polyethyl Glycol-Propyl Glycol (SYSTANE OP) Place 1 drop into both eyes daily as needed (for dry eyes).    . rizatriptan (MAXALT) 10 MG tablet   1  . triamcinolone (NASACORT) 55 MCG/ACT AERO nasal inhaler Place 2 sprays into the nose daily as needed (for seasonal allergies).     . zolpidem (AMBIEN) 10 MG tablet Take 5 mg by mouth at bedtime as needed for sleep.     No current facility-administered medications on file prior to visit.   Allergies  Allergen Reactions    . Effexor [Venlafaxine] Other (See Comments)    REACTION:  unknown  . Nortriptyline Other (See Comments)    REACTION:  unknown  . Paxil [Paroxetine Hcl] Other (See Comments)    REACTION:  unknown  . Robaxin [Methocarbamol] Itching  . Zoloft [Sertraline Hcl] Other (See Comments)    REACTION: unknown   Immunization History  Administered Date(s) Administered  . Influenza Split 04/10/2013  . Influenza, High Dose Seasonal PF 03/24/2014  . Pneumococcal Conjugate-13 03/24/2014  . Pneumococcal Polysaccharide-23 06/13/2008  . Td 09/24/2012  . Zoster 02/23/2011     BP 130/84 mmHg  Pulse 92  Temp(Src) 97.9 F (36.6 C)  Resp 16  Wt 171 lb 3.2 oz (77.656 kg)  SpO2 98% Wt Readings from Last 3 Encounters:  05/01/14 171 lb 3.2 oz (77.656 kg)  03/24/14 173 lb (78.472 kg)  10/09/13 169 lb 6.4 oz (76.839 kg)   Objective:   Physical Exam  Constitutional: She is oriented to person, place, and time. She appears well-developed and well-nourished. She does not have a sickly appearance. No distress.  HENT:  Head: Normocephalic.  Right Ear: Tympanic membrane, external ear and ear canal normal.  Left Ear: Tympanic membrane, external ear and ear canal normal.  Nose: Nose normal. Right sinus exhibits no maxillary sinus tenderness and no frontal sinus tenderness. Left sinus exhibits no maxillary sinus tenderness and no frontal sinus tenderness.  Mouth/Throat: Uvula is midline, oropharynx is clear and moist and mucous membranes are normal. Mucous membranes are not pale and not dry. No uvula swelling. No oropharyngeal exudate, posterior oropharyngeal edema, posterior oropharyngeal erythema or tonsillar abscesses.  Eyes: Conjunctivae and lids are normal. Pupils are equal, round, and reactive to light. Right eye exhibits no discharge. Left eye exhibits no discharge. No scleral icterus.  Neck: Trachea normal, normal range of motion and phonation normal. Neck supple. No tracheal tenderness present. No  tracheal deviation present.  Cardiovascular: Normal rate, regular rhythm, S1 normal, S2 normal, normal heart sounds, intact distal pulses and normal pulses.  Exam reveals no gallop, no distant heart sounds and no friction rub.   No murmur heard. Pulmonary/Chest: Effort normal and breath sounds normal. No stridor. No respiratory distress. She has no decreased breath sounds. She has no wheezes. She has no rhonchi. She has no rales. She exhibits no tenderness.  Abdominal: Soft. Bowel sounds are normal. There is no tenderness. There is no rebound and no guarding.  Musculoskeletal: Normal range of motion. She exhibits edema and tenderness.  Lymphadenopathy:  No tenderness or LAD.  Neurological: She is alert and oriented to person, place, and time. She has normal strength and normal reflexes. No sensory deficit. Gait normal.  Skin: Skin is warm and dry. Abrasion and laceration noted. No rash noted. She is not diaphoretic. There is erythema. No cyanosis. No pallor. Nails show no clubbing.     Psychiatric: She has a normal mood and affect.  Her speech is normal and behavior is normal. Judgment and thought content normal. Cognition and memory are normal.  Vitals reviewed.     Assessment & Plan:  1. Cellulitis of leg, right and right elbow -Take Keflex as prescribed with food- cephALEXin (KEFLEX) 500 MG capsule; Take 1 capsule (500 mg total) by mouth 4 (four) times daily. For 10 days.  Take with food.  Dispense: 40 capsule; Refill: 0 -Take your Norco/Vicodin at home for severe pain -Tetanus not needed as last immunization was 09/24/12 -Please follow up in one week (Wednesday 05/07/14) for recheck. -If you are having extreme pain, swelling, more redness (redness spreading) ,fever and chills, then go to the ED immediately.  Discussed medication effects and SE's.  Pt agreed to treatment plan.  Leighton Luster, Stephani Police, PA-C 3:39 PM [facility]

## 2014-05-01 NOTE — Patient Instructions (Signed)
-  Take the Keflex as prescribed with food.   -Take Norco/Vicodin for severe pain. -Please follow up in one week.    Cellulitis Cellulitis is an infection of the skin and the tissue beneath it. The infected area is usually red and tender. Cellulitis occurs most often in the arms and lower legs.  CAUSES  Cellulitis is caused by bacteria that enter the skin through cracks or cuts in the skin. The most common types of bacteria that cause cellulitis are staphylococci and streptococci. SIGNS AND SYMPTOMS   Redness and warmth.  Swelling.  Tenderness or pain.  Fever. DIAGNOSIS  Your health care provider can usually determine what is wrong based on a physical exam. Blood tests may also be done. TREATMENT  Treatment usually involves taking an antibiotic medicine. HOME CARE INSTRUCTIONS   Take your antibiotic medicine as directed by your health care provider. Finish the antibiotic even if you start to feel better.  Keep the infected arm or leg elevated to reduce swelling.  Apply a warm cloth to the affected area up to 4 times per day to relieve pain.  Take medicines only as directed by your health care provider.  Keep all follow-up visits as directed by your health care provider. SEEK MEDICAL CARE IF:   You notice red streaks coming from the infected area.  Your red area gets larger or turns dark in color.  Your bone or joint underneath the infected area becomes painful after the skin has healed.  Your infection returns in the same area or another area.  You notice a swollen bump in the infected area.  You develop new symptoms.  You have a fever. SEEK IMMEDIATE MEDICAL CARE IF:   You feel very sleepy.  You develop vomiting or diarrhea.  You have a general ill feeling (malaise) with muscle aches and pains. MAKE SURE YOU:   Understand these instructions.  Will watch your condition.  Will get help right away if you are not doing well or get worse. Document Released:  03/09/2005 Document Revised: 10/14/2013 Document Reviewed: 08/15/2011 [facility] Patient Information 2015 [place], [place]. This information is not intended to replace advice given to you by your health care provider. Make sure you discuss any questions you have with your health care provider.

## 2014-05-07 ENCOUNTER — Encounter: Payer: Self-pay | Admitting: Physician Assistant

## 2014-05-07 ENCOUNTER — Ambulatory Visit (INDEPENDENT_AMBULATORY_CARE_PROVIDER_SITE_OTHER): Payer: Medicare Other | Admitting: Physician Assistant

## 2014-05-07 VITALS — BP 122/80 | HR 84 | Temp 98.2°F | Resp 16 | Ht 65.5 in | Wt 169.0 lb

## 2014-05-07 DIAGNOSIS — L03115 Cellulitis of right lower limb: Secondary | ICD-10-CM | POA: Diagnosis not present

## 2014-05-07 DIAGNOSIS — L03119 Cellulitis of unspecified part of limb: Secondary | ICD-10-CM

## 2014-05-07 NOTE — Progress Notes (Signed)
Subjective:    Patient ID: Debra Short, female    DOB: April 29, 1941, 73 y.o.   MRN: [id number]  HPI A 73yo Caucasian female presents to the office today for follow up on cellulitis on right anterior tibia and right elbow.  Patient was last seen on 05/01/14.  Patient states she is feeling better and the leg is not painful or swollen anymore.  She states she is still taking the Keflex as prescribed with food- last dose will be on 05/10/14.  She is elevating her legs when she sits down, and putting Neosporin on the 3 laceration areas.    LAST VISIT: Fall- Incident onset: Last Monday. The fall occurred while walking (Patient states she was carrying a large box with a heavy lamp inside and it was not secure (so the lamp was rolling around in the box). She states she lost her coordination with the lamp rolling and fell over the sofa.). Distance fallen: Fell from standing position. She landed on hard floor. The volume of blood lost was minimal. The point of impact was the right elbow and right knee (She denies hitting her head on anything and LOC). The pain is present in the right knee and right elbow. Pain scale: Pain is a 2/3 out of 10 and stretching or walking the pain is a 7 out of 10. Exacerbated by: walking and bending joint. Pertinent negatives include no abdominal pain, bowel incontinence, fever, headaches, hearing loss, hematuria, loss of consciousness, nausea, numbness, tingling, visual change or vomiting. She has tried rest (She states she used Neosporin for one day. Ace bandage for support) for the symptoms.  GFR= 76 on 03/24/14  Review of Systems  Constitutional: Negative.  Negative for chills, diaphoresis and fatigue.  HENT: Negative.   Eyes: Negative.   Respiratory: Negative.  Negative for cough, chest tightness, shortness of breath and wheezing.   Cardiovascular: Negative for chest pain, palpitations and leg swelling.  Gastrointestinal: Negative.   Genitourinary: Negative.     Musculoskeletal: Negative.  Negative for myalgias.  Skin: Positive for wound. Negative for color change.       Two wounds on right leg and wound on right elbow.  Wounds are scabbed over and slightly erythematous at edge of scabs.  Neurological: Negative.   Psychiatric/Behavioral: Negative.    Past Medical History  Diagnosis Date  . Hyperlipidemia   . Hypertension   . Arthritis   . GERD (gastroesophageal reflux disease)   . Vitamin D deficiency   . IBS (irritable bowel syndrome)   . Prediabetes   . Large cell lymphoma    Current Outpatient Prescriptions on File Prior to Visit  Medication Sig Dispense Refill  . ALPRAZolam (XANAX) 1 MG tablet TAKE 1-2 TABLETS ONCE DAILY AS NEEDED 60 tablet 3  . aspirin 81 MG chewable tablet Chew by mouth daily.    Marland Kitchen atorvastatin (LIPITOR) 80 MG tablet TAKE 1 TABLET BY MOUTH DAILY FOR CHLOESTEROL. DISCONTINUE PRAVASTATIN 30 tablet 3  . bisoprolol-hydrochlorothiazide (ZIAC) 5-6.25 MG per tablet TAKE 1 TABLET BY MOUTH DAILY 90 tablet 1  . cephALEXin (KEFLEX) 500 MG capsule Take 1 capsule (500 mg total) by mouth 4 (four) times daily. For 10 days.  Take with food. 40 capsule 0  . Cholecalciferol (VITAMIN D PO) Take 5,000-10,000 Units by mouth daily. Takes 5,000 units daily, except takes 10,000 units on Mondays, Wednesdays, and Fridays.    . cyclobenzaprine (FLEXERIL) 10 MG tablet TAKE ONE TABLET BY MOUTH AT BEDTIME  30 tablet 5  .  esomeprazole (NEXIUM) 20 MG capsule Take 20 mg by mouth daily at 12 noon.    Marland Kitchen FLUoxetine (PROZAC) 40 MG capsule TAKE 1 CAPSULE EVERY DAY FOR MOOD 90 capsule 3  . gabapentin (NEURONTIN) 300 MG capsule Take 300 mg by mouth 2 (two) times daily.  3  . HYDROcodone-acetaminophen (NORCO/VICODIN) 5-325 MG per tablet Take 0.5 tablets by mouth daily as needed for moderate pain.     . montelukast (SINGULAIR) 10 MG tablet TAKE 1 TABLET EVERY DAY 90 tablet 1  . Polyethyl Glycol-Propyl Glycol (SYSTANE OP) Place 1 drop into both eyes daily as  needed (for dry eyes).    . rizatriptan (MAXALT) 10 MG tablet   1  . triamcinolone (NASACORT) 55 MCG/ACT AERO nasal inhaler Place 2 sprays into the nose daily as needed (for seasonal allergies).     . zolpidem (AMBIEN) 10 MG tablet Take 5 mg by mouth at bedtime as needed for sleep.     No current facility-administered medications on file prior to visit.   Allergies  Allergen Reactions  . Effexor [Venlafaxine] Other (See Comments)    REACTION:  unknown  . Nortriptyline Other (See Comments)    REACTION:  unknown  . Paxil [Paroxetine Hcl] Other (See Comments)    REACTION:  unknown  . Robaxin [Methocarbamol] Itching  . Zoloft [Sertraline Hcl] Other (See Comments)    REACTION: unknown   Immunization History  Administered Date(s) Administered  . Influenza Split 04/10/2013  . Influenza, High Dose Seasonal PF 03/24/2014  . Pneumococcal Conjugate-13 03/24/2014  . Pneumococcal Polysaccharide-23 06/13/2008  . Td 09/24/2012  . Zoster 02/23/2011     BP 122/80 mmHg  Pulse 84  Temp(Src) 98.2 F (36.8 C) (Temporal)  Resp 16  Ht 5' 5.5" (1.664 m)  Wt 169 lb (76.658 kg)  BMI 27.69 kg/m2 Wt Readings from Last 3 Encounters:  05/07/14 169 lb (76.658 kg)  05/01/14 171 lb 3.2 oz (77.656 kg)  03/24/14 173 lb (78.472 kg)   Objective:   Physical Exam  Constitutional: She is oriented to person, place, and time. She appears well-developed and well-nourished. She does not have a sickly appearance. She does not appear ill. No distress.  HENT:  Head: Normocephalic.  Eyes: Conjunctivae and lids are normal. Pupils are equal, round, and reactive to light. Right eye exhibits no discharge. Left eye exhibits no discharge. No scleral icterus.  Neck: Trachea normal, normal range of motion and phonation normal. No tracheal tenderness present.  Cardiovascular: Normal rate, regular rhythm, S1 normal, S2 normal, normal heart sounds, intact distal pulses and normal pulses.  Exam reveals no gallop, no distant  heart sounds and no friction rub.   No murmur heard. Pulmonary/Chest: Effort normal and breath sounds normal. No respiratory distress. She has no decreased breath sounds. She has no wheezes. She has no rhonchi. She has no rales. She exhibits no tenderness.  Abdominal: Soft. Bowel sounds are normal. There is no tenderness. There is no rebound and no guarding.  Musculoskeletal: Normal range of motion. She exhibits no edema or tenderness.       Right elbow: Normal.She exhibits normal range of motion and no swelling. No tenderness found.       Right knee: Normal. She exhibits normal range of motion, no swelling and no erythema. No tenderness found.       Right ankle: Normal. She exhibits normal range of motion, no swelling and normal pulse. No tenderness.  Lymphadenopathy:  No tenderness or LAD.  Neurological: She is alert  and oriented to person, place, and time. She has normal strength and normal reflexes. No sensory deficit. Gait normal.  Skin: Skin is warm and dry. Abrasion and laceration noted. No rash noted. She is not diaphoretic. There is erythema. No cyanosis. No pallor. Nails show no clubbing.     Psychiatric: She has a normal mood and affect. Her speech is normal and behavior is normal. Judgment and thought content normal. Cognition and memory are normal.  Vitals reviewed.     Assessment & Plan:  1. Cellulitis of leg, right and right elbow -Continue to take Keflex as prescribed with food.  Make sure you finish antibiotic completely.- cephALEXin (KEFLEX) 500 MG capsule; Take 1 capsule (500 mg total) by mouth 4 (four) times daily. For 10 days.  Take with food.  Dispense: 40 capsule; Refill: 0 - Continue to use Neosporin twice daily on scabs or Vaseline on scabs. -Continue to sit with legs elevated until scabs are healed. -If you are having extreme pain, swelling, more redness (redness spreading), fever and chills, CP, SOB, then go to the ED immediately.  Discussed medication effects and  SE's.  Pt agreed to treatment plan. Please keep your follow up appt on 06/26/14.  Danitza Schoenfeldt, Stephani Police, PA-C 11:24 AM [facility]

## 2014-05-07 NOTE — Patient Instructions (Addendum)
-  continue Keflex as prescribed. Take with food. -continue Neosporin twice daily on scabbed over areas or Vaseline on leg and right elbow. -If you are having extreme pain, swelling, more redness (redness spreading) ,fever and chills, Chest pain, SOB,  then go to the ED immediately. Please keep follow up in January 2016.  Cellulitis Cellulitis is an infection of the skin and the tissue beneath it. The infected area is usually red and tender. Cellulitis occurs most often in the arms and lower legs.  CAUSES  Cellulitis is caused by bacteria that enter the skin through cracks or cuts in the skin. The most common types of bacteria that cause cellulitis are staphylococci and streptococci. SIGNS AND SYMPTOMS   Redness and warmth.  Swelling.  Tenderness or pain.  Fever. DIAGNOSIS  Your health care provider can usually determine what is wrong based on a physical exam. Blood tests may also be done. TREATMENT  Treatment usually involves taking an antibiotic medicine. HOME CARE INSTRUCTIONS   Take your antibiotic medicine as directed by your health care provider. Finish the antibiotic even if you start to feel better.  Keep the infected arm or leg elevated to reduce swelling.  Apply a warm cloth to the affected area up to 4 times per day to relieve pain.  Take medicines only as directed by your health care provider.  Keep all follow-up visits as directed by your health care provider. SEEK MEDICAL CARE IF:   You notice red streaks coming from the infected area.  Your red area gets larger or turns dark in color.  Your bone or joint underneath the infected area becomes painful after the skin has healed.  Your infection returns in the same area or another area.  You notice a swollen bump in the infected area.  You develop new symptoms.  You have a fever. SEEK IMMEDIATE MEDICAL CARE IF:   You feel very sleepy.  You develop vomiting or diarrhea.  You have a general ill feeling  (malaise) with muscle aches and pains. MAKE SURE YOU:   Understand these instructions.  Will watch your condition.  Will get help right away if you are not doing well or get worse. Document Released: 03/09/2005 Document Revised: 10/14/2013 Document Reviewed: 08/15/2011 [facility] Patient Information 2015 [place], [place]. This information is not intended to replace advice given to you by your health care provider. Make sure you discuss any questions you have with your health care provider.

## 2014-05-13 ENCOUNTER — Ambulatory Visit: Payer: Self-pay | Admitting: Internal Medicine

## 2014-05-20 NOTE — Progress Notes (Signed)
Patient scheduled at [place] [facility] on 06-02-14. Patient aware of appointment

## 2014-06-02 ENCOUNTER — Other Ambulatory Visit: Payer: Medicare Other

## 2014-06-26 ENCOUNTER — Encounter: Payer: Self-pay | Admitting: Physician Assistant

## 2014-06-26 ENCOUNTER — Ambulatory Visit: Payer: Self-pay | Admitting: Internal Medicine

## 2014-06-26 ENCOUNTER — Ambulatory Visit (INDEPENDENT_AMBULATORY_CARE_PROVIDER_SITE_OTHER): Payer: Medicare Other | Admitting: Physician Assistant

## 2014-06-26 VITALS — BP 138/82 | HR 78 | Temp 98.0°F | Resp 16 | Ht 65.5 in | Wt 174.0 lb

## 2014-06-26 DIAGNOSIS — I10 Essential (primary) hypertension: Secondary | ICD-10-CM | POA: Diagnosis not present

## 2014-06-26 DIAGNOSIS — E785 Hyperlipidemia, unspecified: Secondary | ICD-10-CM

## 2014-06-26 DIAGNOSIS — L729 Follicular cyst of the skin and subcutaneous tissue, unspecified: Secondary | ICD-10-CM

## 2014-06-26 DIAGNOSIS — E559 Vitamin D deficiency, unspecified: Secondary | ICD-10-CM | POA: Diagnosis not present

## 2014-06-26 DIAGNOSIS — Z79899 Other long term (current) drug therapy: Secondary | ICD-10-CM

## 2014-06-26 DIAGNOSIS — R7309 Other abnormal glucose: Secondary | ICD-10-CM

## 2014-06-26 DIAGNOSIS — R7303 Prediabetes: Secondary | ICD-10-CM

## 2014-06-26 LAB — CBC WITH DIFFERENTIAL/PLATELET
Basophils Absolute: 0.1 10*3/uL (ref 0.0–0.1)
Basophils Relative: 1 % (ref 0–1)
Eosinophils Absolute: 0.3 10*3/uL (ref 0.0–0.7)
Eosinophils Relative: 4 % (ref 0–5)
HCT: 38.7 % (ref 36.0–46.0)
Hemoglobin: 12.4 g/dL (ref 12.0–15.0)
Lymphocytes Relative: 26 % (ref 12–46)
Lymphs Abs: 1.8 10*3/uL (ref 0.7–4.0)
MCH: 28.2 pg (ref 26.0–34.0)
MCHC: 32 g/dL (ref 30.0–36.0)
MCV: 88.2 fL (ref 78.0–100.0)
MPV: 9.2 fL (ref 8.6–12.4)
Monocytes Absolute: 0.7 10*3/uL (ref 0.1–1.0)
Monocytes Relative: 10 % (ref 3–12)
Neutro Abs: 4 10*3/uL (ref 1.7–7.7)
Neutrophils Relative %: 59 % (ref 43–77)
Platelets: 335 10*3/uL (ref 150–400)
RBC: 4.39 MIL/uL (ref 3.87–5.11)
RDW: 14.8 % (ref 11.5–15.5)
WBC: 6.8 10*3/uL (ref 4.0–10.5)

## 2014-06-26 NOTE — Patient Instructions (Addendum)
-Continue medications as prescribed. Please use neosporin or bacitracin antibiotic cream over the counter on cyst in groin area. -Please wash and clean cyst every day. -Continue nexium and prozac as prescribed for GERD.  Please follow up with Dr. Oletta Lamas.  Take vitamin B12 and iron (leafy green vegetables, beans, carrots, beats)   Please keep your physical appt in May 2016 with Dr. Melford Aase.  Food Choices for Gastroesophageal Reflux Disease When you have gastroesophageal reflux disease (GERD), the foods you eat and your eating habits are very important. Choosing the right foods can help ease the discomfort of GERD. WHAT GENERAL GUIDELINES DO I NEED TO FOLLOW?  Choose fruits, vegetables, whole grains, low-fat dairy products, and low-fat meat, fish, and poultry.  Limit fats such as oils, salad dressings, butter, nuts, and avocado.  Keep a food diary to identify foods that cause symptoms.  Avoid foods that cause reflux. These may be different for different people.  Eat frequent small meals instead of three large meals each day.  Eat your meals slowly, in a relaxed setting.  Limit fried foods.  Cook foods using methods other than frying.  Avoid drinking alcohol.  Avoid drinking large amounts of liquids with your meals.  Avoid bending over or lying down until 2-3 hours after eating. WHAT FOODS ARE NOT RECOMMENDED? The following are some foods and drinks that may worsen your symptoms: Vegetables Tomatoes. Tomato juice. Tomato and spaghetti sauce. Chili peppers. Onion and garlic. Horseradish. Fruits Oranges, grapefruit, and lemon (fruit and juice). Meats High-fat meats, fish, and poultry. This includes hot dogs, ribs, ham, sausage, salami, and bacon. Dairy Whole milk and chocolate milk. Sour cream. Cream. Butter. Ice cream. Cream cheese.  Beverages Coffee and tea, with or without caffeine. Carbonated beverages or energy drinks. Condiments Hot sauce. Barbecue sauce.   Sweets/Desserts Chocolate and cocoa. Donuts. Peppermint and spearmint. Fats and Oils High-fat foods, including [place] fries and potato chips. Other Vinegar. Strong spices, such as black pepper, white pepper, red pepper, cayenne, curry powder, cloves, ginger, and chili powder. The items listed above may not be a complete list of foods and beverages to avoid. Contact your dietitian for more information. Document Released: 05/30/2005 Document Revised: 06/04/2013 Document Reviewed: 04/03/2013 [facility] Patient Information 2015 [place], [place]. This information is not intended to replace advice given to you by your health care provider. Make sure you discuss any questions you have with your health care provider.   Gastroesophageal Reflux Disease, Adult Gastroesophageal reflux disease (GERD) happens when acid from your stomach flows up into the esophagus. When acid comes in contact with the esophagus, the acid causes soreness (inflammation) in the esophagus. Over time, GERD may create small holes (ulcers) in the lining of the esophagus. CAUSES   Increased body weight. This puts pressure on the stomach, making acid rise from the stomach into the esophagus.  Smoking. This increases acid production in the stomach.  Drinking alcohol. This causes decreased pressure in the lower esophageal sphincter (valve or ring of muscle between the esophagus and stomach), allowing acid from the stomach into the esophagus.  Late evening meals and a full stomach. This increases pressure and acid production in the stomach.  A malformed lower esophageal sphincter. Sometimes, no cause is found. SYMPTOMS   Burning pain in the lower part of the mid-chest behind the breastbone and in the mid-stomach area. This may occur twice a week or more often.  Trouble swallowing.  Sore throat.  Dry cough.  Asthma-like symptoms including chest tightness, shortness of  breath, or wheezing. DIAGNOSIS  Your caregiver may be  able to diagnose GERD based on your symptoms. In some cases, X-rays and other tests may be done to check for complications or to check the condition of your stomach and esophagus. TREATMENT  Your caregiver may recommend over-the-counter or prescription medicines to help decrease acid production. Ask your caregiver before starting or adding any new medicines.  HOME CARE INSTRUCTIONS   Change the factors that you can control. Ask your caregiver for guidance concerning weight loss, quitting smoking, and alcohol consumption.  Avoid foods and drinks that make your symptoms worse, such as:  Caffeine or alcoholic drinks.  Chocolate.  Peppermint or mint flavorings.  Garlic and onions.  Spicy foods.  Citrus fruits, such as oranges, lemons, or limes.  Tomato-based foods such as sauce, chili, salsa, and pizza.  Fried and fatty foods.  Avoid lying down for the 3 hours prior to your bedtime or prior to taking a nap.  Eat small, frequent meals instead of large meals.  Wear loose-fitting clothing. Do not wear anything tight around your waist that causes pressure on your stomach.  Raise the head of your bed 6 to 8 inches with wood blocks to help you sleep. Extra pillows will not help.  Only take over-the-counter or prescription medicines for pain, discomfort, or fever as directed by your caregiver.  Do not take aspirin, ibuprofen, or other nonsteroidal anti-inflammatory drugs (NSAIDs). SEEK IMMEDIATE MEDICAL CARE IF:   You have pain in your arms, neck, jaw, teeth, or back.  Your pain increases or changes in intensity or duration.  You develop nausea, vomiting, or sweating (diaphoresis).  You develop shortness of breath, or you faint.  Your vomit is green, yellow, black, or looks like coffee grounds or blood.  Your stool is red, bloody, or black. These symptoms could be signs of other problems, such as heart disease, gastric bleeding, or esophageal bleeding. MAKE SURE YOU:    Understand these instructions.  Will watch your condition.  Will get help right away if you are not doing well or get worse. Document Released: 03/09/2005 Document Revised: 08/22/2011 Document Reviewed: 12/17/2010 [facility] Patient Information 2015 [place], [place]. This information is not intended to replace advice given to you by your health care provider. Make sure you discuss any questions you have with your health care provider.

## 2014-06-26 NOTE — Progress Notes (Signed)
Assessment and Plan:  1. Essential hypertension Continue Ziac as prescribed.  Monitor blood pressure at home.  Reminder to go to the ER if any CP, SOB, nausea, dizziness, severe HA, changes vision/speech, left arm numbness and tingling, and jaw pain. - CBC with Differential - BASIC METABOLIC PANEL WITH GFR - Hepatic function panel  2. Hyperlipidemia Continue Lipitor as prescribed.  Please follow recommended diet and exercise.  Check Cholesterol. - Lipid panel  3. Prediabetes Please follow recommended diet and exercise.  Check A1C and Insulin levels. - Hemoglobin A1c - Insulin, fasting  4. Vitamin D deficiency Continue vitamin D as prescribed.  Will check vitamin D level at next visit due to insurance not covering costs.  5. Encounter for long-term (current) use of medications Will monitor kidney and liver function. - CBC with Differential - BASIC METABOLIC PANEL WITH GFR - Hepatic function panel  6. Cyst- small in groin -Please monitor and if more redness or swelling, then please let us know and come in for appt. -Please use bacitracin or neosporin if you notice drainage.  Discussed patient's sxs and treatment plan with Dr. Melford Aase and Dr. Melford Aase went in to speak with patient at visit to see how she is doing.  Dr. Melford Aase agreed with treatment plan.  Continue diet and meds as discussed. Further disposition pending results of labs. Discussed medication effects and SE's.  Pt agreed to treatment plan. Please keep your physical appt on 10/13/14.  HPI [name] presents for 3 month follow up with hypertension, hyperlipidemia, prediabetes and vitamin D.  Last visit was 03/24/14.  Patient also has questions about lump/cyst in groin.  She states has been there for awhile and just wants it examined to make sure everything is OK.  Patient was recently treated for cellulitis in right leg and right elbow with Keflex and was seen for cellulitis on 05/01/14 and 05/07/14.   Patient states she is feeling much better.  Her blood pressure has been controlled at home, today their BP is BP: 138/82 mmHg  Patient takes Ziac 5-6.25mg  daily.  She does not workout, but does realize she needs and has treadmill at home too. She denies chest pain, shortness of breath, dizziness.   She is on cholesterol medication (Lipitor) and denies myalgias. Her cholesterol is at goal. The cholesterol last visit was:   Lab Results  Component Value Date   CHOL 195 03/24/2014   HDL 47 03/24/2014   LDLCALC 99 03/24/2014   TRIG 244* 03/24/2014   CHOLHDL 4.1 03/24/2014   She has been working on diet and not exercise for prediabetes, and denies increased appetite, polydipsia and polyuria. Last A1C in the office was:  Lab Results  Component Value Date   HGBA1C 6.1* 03/24/2014   She has had prediabetes for several years (2009).  Currently manages prediabetes with diet and exercise.  Patient states she is not a meat eater and is constantly snacking due to being an emotional eater.  Drinks mostly water, diet Dr. Malachi Bonds (1/2 per day) and regular unsweet tea.  Patient is on Vitamin D supplement. - 3 days a week 10,000 IU and 5,000 4 times a week  Lab Results  Component Value Date   VD25OH 67 10/09/2013     Current Medications:  Current Outpatient Prescriptions on File Prior to Visit  Medication Sig Dispense Refill  . ALPRAZolam (XANAX) 1 MG tablet TAKE 1-2 TABLETS ONCE DAILY AS NEEDED 60 tablet 3  . aspirin 81 MG chewable tablet  Chew by mouth daily.    . bisoprolol-hydrochlorothiazide (ZIAC) 5-6.25 MG per tablet TAKE 1 TABLET BY MOUTH DAILY 90 tablet 1  . Cholecalciferol (VITAMIN D PO) Take 5,000-10,000 Units by mouth daily. Takes 5,000 units daily, except takes 10,000 units on Mondays, Wednesdays, and Fridays.    . cyclobenzaprine (FLEXERIL) 10 MG tablet TAKE ONE TABLET BY MOUTH AT BEDTIME  30 tablet 5  . esomeprazole (NEXIUM) 20 MG capsule Take 20 mg by mouth daily at 12 noon.    Marland Kitchen  FLUoxetine (PROZAC) 40 MG capsule TAKE 1 CAPSULE EVERY DAY FOR MOOD 90 capsule 3  . gabapentin (NEURONTIN) 300 MG capsule Take 300 mg by mouth 2 (two) times daily.  3  . HYDROcodone-acetaminophen (NORCO/VICODIN) 5-325 MG per tablet Take 0.5 tablets by mouth daily as needed for moderate pain.     . rizatriptan (MAXALT) 10 MG tablet   1  . triamcinolone (NASACORT) 55 MCG/ACT AERO nasal inhaler Place 2 sprays into the nose daily as needed (for seasonal allergies).     . zolpidem (AMBIEN) 10 MG tablet Take 5 mg by mouth at bedtime as needed for sleep.    Marland Kitchen atorvastatin (LIPITOR) 80 MG tablet TAKE 1 TABLET BY MOUTH DAILY FOR CHLOESTEROL. DISCONTINUE PRAVASTATIN (Patient not taking: Reported on 06/26/2014) 30 tablet 3  . montelukast (SINGULAIR) 10 MG tablet TAKE 1 TABLET EVERY DAY (Patient not taking: Reported on 06/26/2014) 90 tablet 1  . Polyethyl Glycol-Propyl Glycol (SYSTANE OP) Place 1 drop into both eyes daily as needed (for dry eyes).     No current facility-administered medications on file prior to visit.   Medical History:  Past Medical History  Diagnosis Date  . Hyperlipidemia   . Hypertension   . Arthritis   . GERD (gastroesophageal reflux disease)   . Vitamin D deficiency   . IBS (irritable bowel syndrome)   . Prediabetes   . Large cell lymphoma    Allergies:  Allergies  Allergen Reactions  . Effexor [Venlafaxine] Other (See Comments)    REACTION:  unknown  . Nortriptyline Other (See Comments)    REACTION:  unknown  . Paxil [Paroxetine Hcl] Other (See Comments)    REACTION:  unknown  . Robaxin [Methocarbamol] Itching  . Zoloft [Sertraline Hcl] Other (See Comments)    REACTION: unknown    ROS- Review of Systems  Constitutional: Negative.  Negative for fever, chills, malaise/fatigue and diaphoresis.  HENT: Negative.  Negative for congestion, ear discharge, ear pain and sore throat.   Eyes: Negative.   Respiratory: Negative.  Negative for cough, sputum production, shortness  of breath and wheezing.   Cardiovascular: Negative.  Negative for chest pain and leg swelling.  Gastrointestinal: Negative.  Negative for nausea, vomiting, abdominal pain, diarrhea and constipation.  Genitourinary: Negative.  Negative for dysuria, urgency and frequency.  Musculoskeletal: Negative.   Skin: Negative.        Except cyst in groin area.  Neurological: Negative.  Negative for dizziness and headaches.  Psychiatric/Behavioral: Negative.    Family history- Review and unchanged Social history- Review and unchanged  Physical Exam: BP 138/82 mmHg  Pulse 78  Temp(Src) 98 F (36.7 C) (Temporal)  Resp 16  Ht 5' 5.5" (1.664 m)  Wt 174 lb (78.926 kg)  BMI 28.50 kg/m2  SpO2 98% Wt Readings from Last 3 Encounters:  06/26/14 174 lb (78.926 kg)  05/07/14 169 lb (76.658 kg)  05/01/14 171 lb 3.2 oz (77.656 kg)  Vitals Reviewed. General Appearance: Well nourished, in no apparent  distress. Eyes: PERRLA, EOMIs, conjunctiva no swelling or erythema.  No scleral icterus. Sinuses: No Frontal/maxillary tenderness ENT/Mouth: External auditory canals clear, TMs without erythema, edema or bulging. No erythema, edema, or exudate on posterior pharynx.  Tonsils not swollen or erythematous. Hearing normal.  Neck: Supple, thyroid normal.  Respiratory: Respiratory effort normal, CTAB.  No w/r/r or stridor.  Cardio: RRR.  m/r/g. S1S2nl. Brisk peripheral pulses without edema.  Abdomen: Soft, flat, + normal BS.  Non tender, no guarding, rebound, hernias, masses. Lymphatics: Non tender without lymphadenopathy.  Musculoskeletal: Full ROM, 5/5 strength, normal gait.  Skin: Warm, dry intact without rashes, lesions, ecchymosis. Right leg cellulitis is healed with two scars on knees that are about 1-2cm long.   Neuro: Cranial nerves intact. No cerebellar symptoms. Sensation intact.  Psych: Awake and oriented X 3, normal affect, Insight and Judgment appropriate.  Physical Exam  Skin:      Debra Short,  Debra Police, PA-C 11:20 AM [facility]

## 2014-06-27 ENCOUNTER — Other Ambulatory Visit: Payer: Self-pay | Admitting: Physician Assistant

## 2014-06-27 DIAGNOSIS — R7303 Prediabetes: Secondary | ICD-10-CM

## 2014-06-27 LAB — BASIC METABOLIC PANEL WITH GFR
BUN: 14 mg/dL (ref 6–23)
CO2: 28 mEq/L (ref 19–32)
Calcium: 9.5 mg/dL (ref 8.4–10.5)
Chloride: 98 mEq/L (ref 96–112)
Creat: 0.71 mg/dL (ref 0.50–1.10)
GFR, Est African American: >89 mL/min
GFR, Est Non African American: 84 mL/min
Glucose, Bld: 99 mg/dL (ref 70–99)
Potassium: 4.5 mEq/L (ref 3.5–5.3)
Sodium: 139 mEq/L (ref 135–145)

## 2014-06-27 LAB — LIPID PANEL
Cholesterol: 211 mg/dL — ABNORMAL HIGH (ref 0–200)
HDL: 38 mg/dL — ABNORMAL LOW (ref >39)
LDL Cholesterol: 135 mg/dL — ABNORMAL HIGH (ref 0–99)
Total CHOL/HDL Ratio: 5.6 Ratio
Triglycerides: 191 mg/dL — ABNORMAL HIGH (ref <150)
VLDL: 38 mg/dL (ref 0–40)

## 2014-06-27 LAB — HEPATIC FUNCTION PANEL
ALT: 17 U/L (ref 0–35)
AST: 20 U/L (ref 0–37)
Albumin: 4.1 g/dL (ref 3.5–5.2)
Alkaline Phosphatase: 66 U/L (ref 39–117)
Bilirubin, Direct: 0.1 mg/dL (ref 0.0–0.3)
Indirect Bilirubin: 0.6 mg/dL (ref 0.2–1.2)
Total Bilirubin: 0.7 mg/dL (ref 0.2–1.2)
Total Protein: 7.3 g/dL (ref 6.0–8.3)

## 2014-06-27 LAB — HEMOGLOBIN A1C
Hgb A1c MFr Bld: 6.4 % — ABNORMAL HIGH (ref <5.7)
Mean Plasma Glucose: 137 mg/dL — ABNORMAL HIGH (ref <117)

## 2014-06-27 LAB — INSULIN, FASTING: Insulin fasting, serum: 18.4 u[IU]/mL (ref 2.0–19.6)

## 2014-06-27 MED ORDER — METFORMIN HCL ER 500 MG PO TB24: 500.0000 mg | ORAL_TABLET | Freq: Every day | ORAL | Status: DC

## 2014-06-29 ENCOUNTER — Other Ambulatory Visit: Payer: Self-pay | Admitting: Internal Medicine

## 2014-07-16 DIAGNOSIS — M170 Bilateral primary osteoarthritis of knee: Secondary | ICD-10-CM | POA: Diagnosis not present

## 2014-07-23 DIAGNOSIS — M170 Bilateral primary osteoarthritis of knee: Secondary | ICD-10-CM | POA: Diagnosis not present

## 2014-07-30 DIAGNOSIS — M170 Bilateral primary osteoarthritis of knee: Secondary | ICD-10-CM | POA: Diagnosis not present

## 2014-08-06 DIAGNOSIS — M170 Bilateral primary osteoarthritis of knee: Secondary | ICD-10-CM | POA: Diagnosis not present

## 2014-08-07 DIAGNOSIS — G518 Other disorders of facial nerve: Secondary | ICD-10-CM | POA: Diagnosis not present

## 2014-08-07 DIAGNOSIS — M791 Myalgia: Secondary | ICD-10-CM | POA: Diagnosis not present

## 2014-08-07 DIAGNOSIS — G43019 Migraine without aura, intractable, without status migrainosus: Secondary | ICD-10-CM | POA: Diagnosis not present

## 2014-08-07 DIAGNOSIS — G43719 Chronic migraine without aura, intractable, without status migrainosus: Secondary | ICD-10-CM | POA: Diagnosis not present

## 2014-08-07 DIAGNOSIS — R51 Headache: Secondary | ICD-10-CM | POA: Diagnosis not present

## 2014-08-07 DIAGNOSIS — M542 Cervicalgia: Secondary | ICD-10-CM | POA: Diagnosis not present

## 2014-08-22 DIAGNOSIS — R51 Headache: Secondary | ICD-10-CM | POA: Diagnosis not present

## 2014-08-22 DIAGNOSIS — G43019 Migraine without aura, intractable, without status migrainosus: Secondary | ICD-10-CM | POA: Diagnosis not present

## 2014-08-22 DIAGNOSIS — M791 Myalgia: Secondary | ICD-10-CM | POA: Diagnosis not present

## 2014-08-22 DIAGNOSIS — G518 Other disorders of facial nerve: Secondary | ICD-10-CM | POA: Diagnosis not present

## 2014-08-22 DIAGNOSIS — M542 Cervicalgia: Secondary | ICD-10-CM | POA: Diagnosis not present

## 2014-08-22 DIAGNOSIS — G43719 Chronic migraine without aura, intractable, without status migrainosus: Secondary | ICD-10-CM | POA: Diagnosis not present

## 2014-09-18 ENCOUNTER — Other Ambulatory Visit: Payer: Self-pay

## 2014-09-18 DIAGNOSIS — Z1231 Encounter for screening mammogram for malignant neoplasm of breast: Secondary | ICD-10-CM

## 2014-09-22 ENCOUNTER — Telehealth: Payer: Self-pay | Admitting: *Deleted

## 2014-09-22 MED ORDER — TIZANIDINE HCL 4 MG PO TABS: ORAL_TABLET | ORAL | Status: DC

## 2014-09-22 NOTE — Telephone Encounter (Signed)
Patient called and states she lost her Flexeril tabs and requested a refill.  Per Dr Melford Aase,  [place] to send in RX for Zanaflex 4 mg.

## 2014-09-24 ENCOUNTER — Ambulatory Visit
Admission: RE | Admit: 2014-09-24 | Discharge: 2014-09-24 | Disposition: A | Discharge status: Home / Self Care | Payer: Medicare Other | Source: Ambulatory Visit

## 2014-09-24 DIAGNOSIS — Z1231 Encounter for screening mammogram for malignant neoplasm of breast: Secondary | ICD-10-CM

## 2014-09-25 DIAGNOSIS — R5383 Other fatigue: Secondary | ICD-10-CM | POA: Diagnosis not present

## 2014-09-25 DIAGNOSIS — M797 Fibromyalgia: Secondary | ICD-10-CM | POA: Diagnosis not present

## 2014-09-25 DIAGNOSIS — M170 Bilateral primary osteoarthritis of knee: Secondary | ICD-10-CM | POA: Diagnosis not present

## 2014-09-25 DIAGNOSIS — G4701 Insomnia due to medical condition: Secondary | ICD-10-CM | POA: Diagnosis not present

## 2014-09-26 ENCOUNTER — Emergency Department (HOSPITAL_COMMUNITY): Payer: Medicare Other | Admitting: Certified Registered Nurse Anesthetist

## 2014-09-26 ENCOUNTER — Emergency Department (HOSPITAL_COMMUNITY): Payer: Medicare Other

## 2014-09-26 ENCOUNTER — Encounter (HOSPITAL_COMMUNITY): Payer: Self-pay | Admitting: Emergency Medicine

## 2014-09-26 ENCOUNTER — Inpatient Hospital Stay (HOSPITAL_COMMUNITY)
Admission: EM | Admit: 2014-09-26 | Discharge: 2014-09-30 | DRG: 494 | Disposition: A | Discharge status: Skilled Nursing Facility | Payer: Medicare Other | Attending: Orthopedic Surgery | Admitting: Orthopedic Surgery

## 2014-09-26 ENCOUNTER — Encounter (HOSPITAL_COMMUNITY)
Admission: EM | Disposition: A | Discharge status: Skilled Nursing Facility | Payer: Self-pay | Source: Home / Self Care | Attending: Orthopedic Surgery

## 2014-09-26 DIAGNOSIS — M21272 Flexion deformity, left ankle and toes: Secondary | ICD-10-CM | POA: Diagnosis not present

## 2014-09-26 DIAGNOSIS — Z9181 History of falling: Secondary | ICD-10-CM | POA: Diagnosis not present

## 2014-09-26 DIAGNOSIS — G8918 Other acute postprocedural pain: Secondary | ICD-10-CM | POA: Diagnosis not present

## 2014-09-26 DIAGNOSIS — R0902 Hypoxemia: Secondary | ICD-10-CM | POA: Diagnosis not present

## 2014-09-26 DIAGNOSIS — S82842B Displaced bimalleolar fracture of left lower leg, initial encounter for open fracture type I or II: Secondary | ICD-10-CM | POA: Diagnosis not present

## 2014-09-26 DIAGNOSIS — I10 Essential (primary) hypertension: Secondary | ICD-10-CM | POA: Diagnosis present

## 2014-09-26 DIAGNOSIS — I517 Cardiomegaly: Secondary | ICD-10-CM | POA: Diagnosis not present

## 2014-09-26 DIAGNOSIS — Z7982 Long term (current) use of aspirin: Secondary | ICD-10-CM | POA: Diagnosis not present

## 2014-09-26 DIAGNOSIS — Z01818 Encounter for other preprocedural examination: Secondary | ICD-10-CM | POA: Diagnosis not present

## 2014-09-26 DIAGNOSIS — W19XXXA Unspecified fall, initial encounter: Secondary | ICD-10-CM | POA: Diagnosis present

## 2014-09-26 DIAGNOSIS — E119 Type 2 diabetes mellitus without complications: Secondary | ICD-10-CM | POA: Diagnosis not present

## 2014-09-26 DIAGNOSIS — T148XXA Other injury of unspecified body region, initial encounter: Secondary | ICD-10-CM

## 2014-09-26 DIAGNOSIS — S82892B Other fracture of left lower leg, initial encounter for open fracture type I or II: Secondary | ICD-10-CM | POA: Diagnosis present

## 2014-09-26 DIAGNOSIS — R278 Other lack of coordination: Secondary | ICD-10-CM | POA: Diagnosis not present

## 2014-09-26 DIAGNOSIS — S82302D Unspecified fracture of lower end of left tibia, subsequent encounter for closed fracture with routine healing: Secondary | ICD-10-CM | POA: Diagnosis not present

## 2014-09-26 DIAGNOSIS — M25572 Pain in left ankle and joints of left foot: Secondary | ICD-10-CM | POA: Diagnosis not present

## 2014-09-26 DIAGNOSIS — M6281 Muscle weakness (generalized): Secondary | ICD-10-CM | POA: Diagnosis not present

## 2014-09-26 DIAGNOSIS — E559 Vitamin D deficiency, unspecified: Secondary | ICD-10-CM | POA: Diagnosis present

## 2014-09-26 DIAGNOSIS — T148 Other injury of unspecified body region: Secondary | ICD-10-CM | POA: Diagnosis not present

## 2014-09-26 DIAGNOSIS — E785 Hyperlipidemia, unspecified: Secondary | ICD-10-CM | POA: Diagnosis present

## 2014-09-26 DIAGNOSIS — K589 Irritable bowel syndrome without diarrhea: Secondary | ICD-10-CM | POA: Diagnosis present

## 2014-09-26 DIAGNOSIS — M1990 Unspecified osteoarthritis, unspecified site: Secondary | ICD-10-CM | POA: Diagnosis present

## 2014-09-26 DIAGNOSIS — E1169 Type 2 diabetes mellitus with other specified complication: Secondary | ICD-10-CM | POA: Diagnosis present

## 2014-09-26 DIAGNOSIS — S82892E Other fracture of left lower leg, subsequent encounter for open fracture type I or II with routine healing: Secondary | ICD-10-CM

## 2014-09-26 DIAGNOSIS — Z888 Allergy status to other drugs, medicaments and biological substances status: Secondary | ICD-10-CM | POA: Diagnosis not present

## 2014-09-26 DIAGNOSIS — S82842A Displaced bimalleolar fracture of left lower leg, initial encounter for closed fracture: Secondary | ICD-10-CM | POA: Diagnosis not present

## 2014-09-26 DIAGNOSIS — R2681 Unsteadiness on feet: Secondary | ICD-10-CM | POA: Diagnosis not present

## 2014-09-26 DIAGNOSIS — K219 Gastro-esophageal reflux disease without esophagitis: Secondary | ICD-10-CM | POA: Diagnosis present

## 2014-09-26 DIAGNOSIS — Z4789 Encounter for other orthopedic aftercare: Secondary | ICD-10-CM | POA: Diagnosis not present

## 2014-09-26 DIAGNOSIS — E1122 Type 2 diabetes mellitus with diabetic chronic kidney disease: Secondary | ICD-10-CM

## 2014-09-26 DIAGNOSIS — S82842E Displaced bimalleolar fracture of left lower leg, subsequent encounter for open fracture type I or II with routine healing: Secondary | ICD-10-CM | POA: Diagnosis not present

## 2014-09-26 DIAGNOSIS — S8252XB Displaced fracture of medial malleolus of left tibia, initial encounter for open fracture type I or II: Secondary | ICD-10-CM | POA: Diagnosis not present

## 2014-09-26 DIAGNOSIS — M810 Age-related osteoporosis without current pathological fracture: Secondary | ICD-10-CM | POA: Diagnosis present

## 2014-09-26 DIAGNOSIS — S82832D Other fracture of upper and lower end of left fibula, subsequent encounter for closed fracture with routine healing: Secondary | ICD-10-CM | POA: Diagnosis not present

## 2014-09-26 DIAGNOSIS — S82892A Other fracture of left lower leg, initial encounter for closed fracture: Secondary | ICD-10-CM | POA: Diagnosis not present

## 2014-09-26 HISTORY — DX: Type 2 diabetes mellitus without complications: E11.9

## 2014-09-26 HISTORY — DX: Fibromyalgia: M79.7

## 2014-09-26 HISTORY — PX: ORIF ANKLE FRACTURE: SHX5408

## 2014-09-26 LAB — CBC WITH DIFFERENTIAL/PLATELET
Basophils Absolute: 0 10*3/uL (ref 0.0–0.1)
Basophils Relative: 0 % (ref 0–1)
Eosinophils Absolute: 0.2 10*3/uL (ref 0.0–0.7)
Eosinophils Relative: 2 % (ref 0–5)
HCT: 34.7 % — ABNORMAL LOW (ref 36.0–46.0)
Hemoglobin: 11 g/dL — ABNORMAL LOW (ref 12.0–15.0)
Lymphocytes Relative: 24 % (ref 12–46)
Lymphs Abs: 2.3 10*3/uL (ref 0.7–4.0)
MCH: 27.8 pg (ref 26.0–34.0)
MCHC: 31.7 g/dL (ref 30.0–36.0)
MCV: 87.6 fL (ref 78.0–100.0)
Monocytes Absolute: 0.9 10*3/uL (ref 0.1–1.0)
Monocytes Relative: 9 % (ref 3–12)
Neutro Abs: 6.3 10*3/uL (ref 1.7–7.7)
Neutrophils Relative %: 65 % (ref 43–77)
Platelets: 285 10*3/uL (ref 150–400)
RBC: 3.96 MIL/uL (ref 3.87–5.11)
RDW: 14.4 % (ref 11.5–15.5)
WBC: 9.7 10*3/uL (ref 4.0–10.5)

## 2014-09-26 LAB — TYPE AND SCREEN
ABO/RH(D): A POS
Antibody Screen: NEGATIVE

## 2014-09-26 LAB — BASIC METABOLIC PANEL
Anion gap: 13 (ref 5–15)
BUN: 11 mg/dL (ref 6–23)
CO2: 25 mmol/L (ref 19–32)
Calcium: 9.1 mg/dL (ref 8.4–10.5)
Chloride: 101 mmol/L (ref 96–112)
Creatinine, Ser: 0.87 mg/dL (ref 0.50–1.10)
GFR calc Af Amer: 73 mL/min — ABNORMAL LOW (ref >90)
GFR calc non Af Amer: 63 mL/min — ABNORMAL LOW (ref >90)
Glucose, Bld: 141 mg/dL — ABNORMAL HIGH (ref 70–99)
Potassium: 4 mmol/L (ref 3.5–5.1)
Sodium: 139 mmol/L (ref 135–145)

## 2014-09-26 LAB — GLUCOSE, CAPILLARY
Glucose-Capillary: 140 mg/dL — ABNORMAL HIGH (ref 70–99)
Glucose-Capillary: 153 mg/dL — ABNORMAL HIGH (ref 70–99)
Glucose-Capillary: 127 mg/dL — ABNORMAL HIGH (ref 70–99)

## 2014-09-26 LAB — CBG MONITORING, ED: Glucose-Capillary: 90 mg/dL (ref 70–99)

## 2014-09-26 LAB — ABO/RH: ABO/RH(D): A POS

## 2014-09-26 SURGERY — OPEN REDUCTION INTERNAL FIXATION (ORIF) ANKLE FRACTURE
Anesthesia: General | Site: Ankle | Laterality: Left

## 2014-09-26 MED ORDER — ACETAMINOPHEN 325 MG PO TABS
650.0000 mg | ORAL_TABLET | Freq: Four times a day (QID) | ORAL | Status: DC | PRN
  Filled 2014-09-26: qty 2

## 2014-09-26 MED ORDER — HYDROMORPHONE HCL 1 MG/ML IJ SOLN
0.5000 mg | Freq: Once | INTRAMUSCULAR | Status: AC
  Administered 2014-09-26: 0.5 mg via INTRAVENOUS
  Filled 2014-09-26: qty 1

## 2014-09-26 MED ORDER — METOCLOPRAMIDE HCL 5 MG/ML IJ SOLN
INTRAMUSCULAR | Status: DC | PRN
  Administered 2014-09-26: 5 mg via INTRAVENOUS

## 2014-09-26 MED ORDER — HYDROMORPHONE HCL 1 MG/ML IJ SOLN: 0.5000 mg | INTRAMUSCULAR | Status: DC | PRN

## 2014-09-26 MED ORDER — DEXAMETHASONE SODIUM PHOSPHATE 4 MG/ML IJ SOLN
INTRAMUSCULAR | Status: DC | PRN
  Administered 2014-09-26: 4 mg via INTRAVENOUS

## 2014-09-26 MED ORDER — FENTANYL CITRATE (PF) 250 MCG/5ML IJ SOLN
INTRAMUSCULAR | Status: AC
  Filled 2014-09-26: qty 5

## 2014-09-26 MED ORDER — METFORMIN HCL ER 500 MG PO TB24
500.0000 mg | ORAL_TABLET | Freq: Every day | ORAL | Status: DC
  Administered 2014-09-27 – 2014-09-30 (×4): 500 mg via ORAL
  Filled 2014-09-26 (×5): qty 1

## 2014-09-26 MED ORDER — CHLORHEXIDINE GLUCONATE 4 % EX LIQD
60.0000 mL | Freq: Once | CUTANEOUS | Status: DC
  Filled 2014-09-26: qty 60

## 2014-09-26 MED ORDER — SODIUM CHLORIDE 0.9 % IV SOLN
INTRAVENOUS | Status: DC
  Administered 2014-09-26: 08:00:00 via INTRAVENOUS

## 2014-09-26 MED ORDER — ONDANSETRON HCL 4 MG/2ML IJ SOLN: 4.0000 mg | Freq: Once | INTRAMUSCULAR | Status: DC | PRN

## 2014-09-26 MED ORDER — GABAPENTIN 300 MG PO CAPS: 300.0000 mg | ORAL_CAPSULE | Freq: Two times a day (BID) | ORAL | Status: DC | PRN

## 2014-09-26 MED ORDER — TRIAMCINOLONE ACETONIDE 55 MCG/ACT NA AERO
2.0000 | INHALATION_SPRAY | Freq: Every day | NASAL | Status: DC | PRN
  Filled 2014-09-26: qty 21.6

## 2014-09-26 MED ORDER — PROPOFOL 10 MG/ML IV BOLUS
INTRAVENOUS | Status: DC | PRN
  Administered 2014-09-26: 200 mg via INTRAVENOUS

## 2014-09-26 MED ORDER — HYDROMORPHONE HCL 1 MG/ML IJ SOLN
INTRAMUSCULAR | Status: AC
  Administered 2014-09-26: 0.5 mg via INTRAVENOUS
  Filled 2014-09-26: qty 1

## 2014-09-26 MED ORDER — ZOLPIDEM TARTRATE 5 MG PO TABS
5.0000 mg | ORAL_TABLET | Freq: Every day | ORAL | Status: DC
  Administered 2014-09-26 – 2014-09-29 (×4): 5 mg via ORAL
  Filled 2014-09-26 (×4): qty 1

## 2014-09-26 MED ORDER — CEPHALEXIN 500 MG PO CAPS: 500.0000 mg | ORAL_CAPSULE | Freq: Three times a day (TID) | ORAL | Status: DC

## 2014-09-26 MED ORDER — DOCUSATE SODIUM 100 MG PO CAPS
100.0000 mg | ORAL_CAPSULE | Freq: Two times a day (BID) | ORAL | Status: DC
  Administered 2014-09-26 – 2014-09-30 (×8): 100 mg via ORAL
  Filled 2014-09-26 (×9): qty 1

## 2014-09-26 MED ORDER — SUMATRIPTAN SUCCINATE 100 MG PO TABS
100.0000 mg | ORAL_TABLET | ORAL | Status: DC | PRN
  Filled 2014-09-26: qty 1

## 2014-09-26 MED ORDER — LACTATED RINGERS IV SOLN
INTRAVENOUS | Status: DC | PRN
  Administered 2014-09-26 (×2): via INTRAVENOUS

## 2014-09-26 MED ORDER — EPHEDRINE SULFATE 50 MG/ML IJ SOLN
INTRAMUSCULAR | Status: DC | PRN
  Administered 2014-09-26 (×5): 5 mg via INTRAVENOUS

## 2014-09-26 MED ORDER — SODIUM CHLORIDE 0.9 % IR SOLN
Status: DC | PRN
  Administered 2014-09-26: 3000 mL

## 2014-09-26 MED ORDER — ASPIRIN 81 MG PO CHEW
81.0000 mg | CHEWABLE_TABLET | Freq: Every day | ORAL | Status: DC
  Administered 2014-09-26 – 2014-09-30 (×5): 81 mg via ORAL
  Filled 2014-09-26 (×5): qty 1

## 2014-09-26 MED ORDER — MIDAZOLAM HCL 5 MG/5ML IJ SOLN
INTRAMUSCULAR | Status: DC | PRN
  Administered 2014-09-26: 2 mg via INTRAVENOUS

## 2014-09-26 MED ORDER — METOCLOPRAMIDE HCL 5 MG/ML IJ SOLN
INTRAMUSCULAR | Status: AC
  Filled 2014-09-26: qty 2

## 2014-09-26 MED ORDER — KETOROLAC TROMETHAMINE 15 MG/ML IJ SOLN
INTRAMUSCULAR | Status: AC
  Administered 2014-09-26: 7.5 mg via INTRAVENOUS
  Filled 2014-09-26: qty 1

## 2014-09-26 MED ORDER — SODIUM CHLORIDE 0.9 % IV SOLN
INTRAVENOUS | Status: DC
  Administered 2014-09-26: 16:00:00 via INTRAVENOUS

## 2014-09-26 MED ORDER — SODIUM CHLORIDE 0.9 % IV BOLUS (SEPSIS)
1000.0000 mL | Freq: Once | INTRAVENOUS | Status: AC
  Administered 2014-09-26: 1000 mL via INTRAVENOUS

## 2014-09-26 MED ORDER — ONDANSETRON HCL 4 MG/2ML IJ SOLN
INTRAMUSCULAR | Status: AC
  Filled 2014-09-26: qty 2

## 2014-09-26 MED ORDER — ACETAMINOPHEN 650 MG RE SUPP: 650.0000 mg | Freq: Four times a day (QID) | RECTAL | Status: DC | PRN

## 2014-09-26 MED ORDER — HYDROCODONE-ACETAMINOPHEN 5-325 MG PO TABS
ORAL_TABLET | ORAL | Status: AC
  Administered 2014-09-26: 3
  Filled 2014-09-26: qty 3

## 2014-09-26 MED ORDER — HYDROCODONE-ACETAMINOPHEN 10-325 MG PO TABS
1.0000 | ORAL_TABLET | ORAL | Status: DC | PRN
  Administered 2014-09-26: 1 via ORAL
  Administered 2014-09-26 – 2014-09-30 (×7): 2 via ORAL
  Filled 2014-09-26 (×3): qty 2
  Filled 2014-09-26: qty 1
  Filled 2014-09-26 (×6): qty 2

## 2014-09-26 MED ORDER — PANTOPRAZOLE SODIUM 40 MG PO TBEC
40.0000 mg | DELAYED_RELEASE_TABLET | Freq: Every day | ORAL | Status: DC
  Administered 2014-09-26 – 2014-09-30 (×5): 40 mg via ORAL
  Filled 2014-09-26 (×5): qty 1

## 2014-09-26 MED ORDER — BISACODYL 10 MG RE SUPP: 10.0000 mg | Freq: Every day | RECTAL | Status: DC | PRN

## 2014-09-26 MED ORDER — BISOPROLOL-HYDROCHLOROTHIAZIDE 5-6.25 MG PO TABS
1.0000 | ORAL_TABLET | Freq: Every day | ORAL | Status: DC
  Administered 2014-09-26 – 2014-09-30 (×5): 1 via ORAL
  Filled 2014-09-26 (×5): qty 1

## 2014-09-26 MED ORDER — INSULIN ASPART 100 UNIT/ML ~~LOC~~ SOLN
0.0000 [IU] | Freq: Three times a day (TID) | SUBCUTANEOUS | Status: DC
Start: 2014-09-26 — End: 2014-09-30
  Administered 2014-09-26: 3 [IU] via SUBCUTANEOUS
  Administered 2014-09-29: 2 [IU] via SUBCUTANEOUS

## 2014-09-26 MED ORDER — POLYETHYLENE GLYCOL 3350 17 G PO PACK: 17.0000 g | PACK | Freq: Every day | ORAL | Status: DC | PRN

## 2014-09-26 MED ORDER — CEFAZOLIN SODIUM 1-5 GM-% IV SOLN
1.0000 g | Freq: Once | INTRAVENOUS | Status: AC
  Administered 2014-09-26: 1 g via INTRAVENOUS
  Filled 2014-09-26: qty 50

## 2014-09-26 MED ORDER — KETOROLAC TROMETHAMINE 15 MG/ML IJ SOLN
7.5000 mg | Freq: Three times a day (TID) | INTRAMUSCULAR | Status: AC
  Administered 2014-09-26 – 2014-09-27 (×3): 7.5 mg via INTRAVENOUS
  Filled 2014-09-26 (×2): qty 1

## 2014-09-26 MED ORDER — 0.9 % SODIUM CHLORIDE (POUR BTL) OPTIME
TOPICAL | Status: DC | PRN
  Administered 2014-09-26: 1000 mL

## 2014-09-26 MED ORDER — OXYCODONE HCL 5 MG/5ML PO SOLN: 5.0000 mg | Freq: Once | ORAL | Status: DC | PRN

## 2014-09-26 MED ORDER — LIDOCAINE HCL (CARDIAC) 20 MG/ML IV SOLN
INTRAVENOUS | Status: DC | PRN
  Administered 2014-09-26: 60 mg via INTRAVENOUS

## 2014-09-26 MED ORDER — ONDANSETRON HCL 4 MG/2ML IJ SOLN: 4.0000 mg | Freq: Four times a day (QID) | INTRAMUSCULAR | Status: DC | PRN

## 2014-09-26 MED ORDER — FLUOXETINE HCL 20 MG PO CAPS
40.0000 mg | ORAL_CAPSULE | Freq: Every day | ORAL | Status: DC
  Administered 2014-09-26 – 2014-09-30 (×5): 40 mg via ORAL
  Filled 2014-09-26 (×9): qty 2

## 2014-09-26 MED ORDER — ONDANSETRON HCL 4 MG PO TABS: 4.0000 mg | ORAL_TABLET | Freq: Four times a day (QID) | ORAL | Status: DC | PRN

## 2014-09-26 MED ORDER — OXYCODONE HCL 5 MG PO TABS: 5.0000 mg | ORAL_TABLET | Freq: Once | ORAL | Status: DC | PRN

## 2014-09-26 MED ORDER — INSULIN ASPART 100 UNIT/ML ~~LOC~~ SOLN: 0.0000 [IU] | SUBCUTANEOUS | Status: DC

## 2014-09-26 MED ORDER — CEFAZOLIN SODIUM-DEXTROSE 2-3 GM-% IV SOLR
2.0000 g | Freq: Three times a day (TID) | INTRAVENOUS | Status: AC
  Administered 2014-09-26 – 2014-09-28 (×6): 2 g via INTRAVENOUS
  Filled 2014-09-26 (×6): qty 50

## 2014-09-26 MED ORDER — CEFAZOLIN SODIUM-DEXTROSE 2-3 GM-% IV SOLR
2.0000 g | INTRAVENOUS | Status: AC
  Administered 2014-09-26: 2 g via INTRAVENOUS
  Filled 2014-09-26: qty 50

## 2014-09-26 MED ORDER — TETANUS-DIPHTH-ACELL PERTUSSIS 5-2.5-18.5 LF-MCG/0.5 IM SUSP
0.5000 mL | Freq: Once | INTRAMUSCULAR | Status: AC
  Administered 2014-09-26: 0.5 mL via INTRAMUSCULAR
  Filled 2014-09-26: qty 0.5

## 2014-09-26 MED ORDER — HYDROCODONE-ACETAMINOPHEN 10-325 MG PO TABS: 1.0000 | ORAL_TABLET | Freq: Four times a day (QID) | ORAL | Status: DC | PRN

## 2014-09-26 MED ORDER — ALBUMIN HUMAN 5 % IV SOLN
INTRAVENOUS | Status: DC | PRN
  Administered 2014-09-26: 10:00:00 via INTRAVENOUS

## 2014-09-26 MED ORDER — ZOLPIDEM TARTRATE 5 MG PO TABS
5.0000 mg | ORAL_TABLET | Freq: Once | ORAL | Status: AC
  Administered 2014-09-26: 5 mg via ORAL
  Filled 2014-09-26: qty 1

## 2014-09-26 MED ORDER — CELECOXIB 200 MG PO CAPS: 200.0000 mg | ORAL_CAPSULE | Freq: Every day | ORAL | Status: DC | PRN

## 2014-09-26 MED ORDER — ONDANSETRON HCL 4 MG/2ML IJ SOLN
INTRAMUSCULAR | Status: DC | PRN
  Administered 2014-09-26: 4 mg via INTRAVENOUS

## 2014-09-26 MED ORDER — MIDAZOLAM HCL 2 MG/2ML IJ SOLN
INTRAMUSCULAR | Status: AC
  Filled 2014-09-26: qty 2

## 2014-09-26 MED ORDER — MONTELUKAST SODIUM 10 MG PO TABS
10.0000 mg | ORAL_TABLET | Freq: Every day | ORAL | Status: DC
  Administered 2014-09-26 – 2014-09-30 (×5): 10 mg via ORAL
  Filled 2014-09-26 (×5): qty 1

## 2014-09-26 MED ORDER — HYDROMORPHONE HCL 1 MG/ML IJ SOLN
0.2500 mg | INTRAMUSCULAR | Status: DC | PRN
  Administered 2014-09-26 (×3): 0.5 mg via INTRAVENOUS

## 2014-09-26 MED ORDER — ALPRAZOLAM 0.5 MG PO TABS
1.0000 mg | ORAL_TABLET | Freq: Two times a day (BID) | ORAL | Status: DC | PRN
Start: 2014-09-26 — End: 2014-09-30

## 2014-09-26 MED ORDER — FENTANYL CITRATE (PF) 100 MCG/2ML IJ SOLN
INTRAMUSCULAR | Status: DC | PRN
  Administered 2014-09-26: 50 ug via INTRAVENOUS
  Administered 2014-09-26 (×2): 25 ug via INTRAVENOUS

## 2014-09-26 SURGICAL SUPPLY — 68 items
BANDAGE ELASTIC 4 VELCRO ST LF (GAUZE/BANDAGES/DRESSINGS) ×1 IMPLANT
BANDAGE ELASTIC 6 VELCRO ST LF (GAUZE/BANDAGES/DRESSINGS) ×1 IMPLANT
BANDAGE ESMARK 6X9 LF (GAUZE/BANDAGES/DRESSINGS) ×1 IMPLANT
BIT DRILL 2.5X2.75 QC CALB (BIT) ×1 IMPLANT
BIT DRILL 2.9 CANN QC NONSTRL (BIT) ×1 IMPLANT
BIT DRILL 3.5X5.5 QC CALB (BIT) ×1 IMPLANT
BNDG CMPR 9X6 STRL LF SNTH (GAUZE/BANDAGES/DRESSINGS) ×1
BNDG ESMARK 6X9 LF (GAUZE/BANDAGES/DRESSINGS) ×2
BNDG GAUZE ELAST 4 BULKY (GAUZE/BANDAGES/DRESSINGS) ×1 IMPLANT
COVER SURGICAL LIGHT HANDLE (MISCELLANEOUS) ×3 IMPLANT
CUFF TOURNIQUET SINGLE 34IN LL (TOURNIQUET CUFF) ×1 IMPLANT
DRAIN PENROSE 1/4X12 LTX STRL (WOUND CARE) ×1 IMPLANT
DRAPE C-ARM 42X72 X-RAY (DRAPES) ×1 IMPLANT
DRAPE C-ARMOR (DRAPES) ×1 IMPLANT
DRAPE U-SHAPE 47X51 STRL (DRAPES) ×2 IMPLANT
DRSG PAD ABDOMINAL 8X10 ST (GAUZE/BANDAGES/DRESSINGS) ×2 IMPLANT
ELECT REM PT RETURN 9FT ADLT (ELECTROSURGICAL) ×2
ELECTRODE REM PT RTRN 9FT ADLT (ELECTROSURGICAL) ×1 IMPLANT
GAUZE SPONGE 4X4 12PLY STRL (GAUZE/BANDAGES/DRESSINGS) IMPLANT
GAUZE XEROFORM 1X8 LF (GAUZE/BANDAGES/DRESSINGS) ×2 IMPLANT
GLOVE BIO SURGEON STRL SZ 6.5 (GLOVE) ×2 IMPLANT
GLOVE BIO SURGEON STRL SZ7.5 (GLOVE) ×2 IMPLANT
GLOVE BIOGEL PI IND STRL 7.0 (GLOVE) IMPLANT
GLOVE BIOGEL PI IND STRL 8 (GLOVE) ×2 IMPLANT
GLOVE BIOGEL PI INDICATOR 7.0 (GLOVE) ×6
GLOVE BIOGEL PI INDICATOR 8 (GLOVE) ×2
GLOVE ECLIPSE 7.5 STRL STRAW (GLOVE) ×4 IMPLANT
GLOVE SURG SS PI 6.5 STRL IVOR (GLOVE) ×1 IMPLANT
GLOVE SURG SS PI 7.5 STRL IVOR (GLOVE) ×1 IMPLANT
GOWN STRL REUS W/ TWL LRG LVL3 (GOWN DISPOSABLE) ×1 IMPLANT
GOWN STRL REUS W/ TWL XL LVL3 (GOWN DISPOSABLE) ×2 IMPLANT
GOWN STRL REUS W/TWL LRG LVL3 (GOWN DISPOSABLE) ×2
GOWN STRL REUS W/TWL XL LVL3 (GOWN DISPOSABLE) ×4
HANDPIECE INTERPULSE COAX TIP (DISPOSABLE) ×2
KIT BASIN OR (CUSTOM PROCEDURE TRAY) ×2 IMPLANT
KIT ROOM TURNOVER OR (KITS) ×2 IMPLANT
MANIFOLD NEPTUNE II (INSTRUMENTS) ×2 IMPLANT
PACK ORTHO EXTREMITY (CUSTOM PROCEDURE TRAY) ×2 IMPLANT
PAD ARMBOARD 7.5X6 YLW CONV (MISCELLANEOUS) ×4 IMPLANT
PAD CAST 4YDX4 CTTN HI CHSV (CAST SUPPLIES) IMPLANT
PADDING CAST ABS 4INX4YD NS (CAST SUPPLIES) ×1
PADDING CAST ABS 6INX4YD NS (CAST SUPPLIES) ×1
PADDING CAST ABS COTTON 4X4 ST (CAST SUPPLIES) IMPLANT
PADDING CAST ABS COTTON 6X4 NS (CAST SUPPLIES) IMPLANT
PADDING CAST COTTON 4X4 STRL (CAST SUPPLIES)
PLATE ACE 100DEG 7HOLE (Plate) ×1 IMPLANT
SCREW ACE CAN 4.0 36M (Screw) ×2 IMPLANT
SCREW CORT 3.5X16 815037016 (Screw) ×1 IMPLANT
SCREW CORTICAL 3.5MM  12MM (Screw) ×1 IMPLANT
SCREW CORTICAL 3.5MM  20MM (Screw) ×1 IMPLANT
SCREW CORTICAL 3.5MM 12MM (Screw) IMPLANT
SCREW CORTICAL 3.5MM 14MM (Screw) ×3 IMPLANT
SCREW CORTICAL 3.5MM 20MM (Screw) IMPLANT
SCREW NLOCK CANC HEX 4X16 (Screw) ×3 IMPLANT
SET HNDPC FAN SPRY TIP SCT (DISPOSABLE) IMPLANT
SPLINT FIBERGLASS 4X30 (CAST SUPPLIES) ×1 IMPLANT
SPONGE GAUZE 4X4 12PLY STER LF (GAUZE/BANDAGES/DRESSINGS) ×1 IMPLANT
SPONGE LAP 4X18 X RAY DECT (DISPOSABLE) ×4 IMPLANT
STAPLER VISISTAT 35W (STAPLE) ×1 IMPLANT
SUCTION FRAZIER TIP 10 FR DISP (SUCTIONS) ×3 IMPLANT
SUT ETHILON 4 0 PS 2 18 (SUTURE) IMPLANT
SUT VIC AB 0 CTB1 27 (SUTURE) IMPLANT
SUT VIC AB 2-0 FS1 27 (SUTURE) ×2 IMPLANT
TOWEL OR 17X24 6PK STRL BLUE (TOWEL DISPOSABLE) ×2 IMPLANT
TOWEL OR 17X26 10 PK STRL BLUE (TOWEL DISPOSABLE) ×2 IMPLANT
TUBE CONNECTING 12X1/4 (SUCTIONS) ×2 IMPLANT
WATER STERILE IRR 1000ML POUR (IV SOLUTION) ×2 IMPLANT
WIRE K 1.6MM 144256 (MISCELLANEOUS) ×2 IMPLANT

## 2014-09-26 NOTE — Progress Notes (Signed)
Pt. Arrived to unit from ED. With one personal bag of belongings: Clothes and  a purse . Pt. statedher son left and went back to work. I called his cell number but no answer. Pt. Stated she had no valuables in the purse and it didn't need to be locked up. Bag taken to PACU.

## 2014-09-26 NOTE — ED Notes (Signed)
Called son and left message to return call at [phone].

## 2014-09-26 NOTE — Progress Notes (Signed)
Utilization review completed.  

## 2014-09-26 NOTE — Progress Notes (Signed)
Orthopedic Tech Progress Note Patient Details:  Debra Short Oct 28, 1940 [id number]  Ortho Devices Type of Ortho Device: Ace wrap, Stirrup splint, Post (short leg) splint Ortho Device/Splint Interventions: Application   Cammer, Theodoro Parma 09/26/2014, 8:31 AM

## 2014-09-26 NOTE — ED Notes (Signed)
Per EMS, patient attempted to stand up from chair and bone from leg "fell through", bone showing on left ankle. Sensation intact, 20g in L AC with approximately 144mL infused enroute. bp 96/76, no pain medication given. Patient is alert and oriented on arrival.

## 2014-09-26 NOTE — ED Notes (Signed)
Dr Graves at bedside

## 2014-09-26 NOTE — Transfer of Care (Signed)
Immediate Anesthesia Transfer of Care Note  Patient: Debra Short  Procedure(s) Performed: Procedure(s): OPEN REDUCTION INTERNAL FIXATION (ORIF) ANKLE FRACTURE (Left)  Patient Location: PACU  Anesthesia Type:GA combined with regional for post-op pain  Level of Consciousness: sedated  Airway & Oxygen Therapy: Patient Spontanous Breathing and Patient connected to nasal cannula oxygen  Post-op Assessment: Report given to RN and Post -op Vital signs reviewed and stable  Post vital signs: Reviewed and stable  Last Vitals:  Filed Vitals:   09/26/14 0941  BP:   Pulse: 81  Temp:   Resp: 14    Complications: No apparent anesthesia complications

## 2014-09-26 NOTE — Consult Note (Signed)
[facility] Consultation note                                                                                    Debra Short, is a 74 y.o. female  MRN: [id number]   DOB - 12/11/1940  Admit Date - 09/26/2014  Outpatient Primary MD for the patient is Debra Richards, MD   Requesting physician: Dr. [name]  Reason for consultation: Assist with management of diabetes  With History of -  Past Medical History  Diagnosis Date  . Hyperlipidemia   . Hypertension   . Arthritis   . GERD (gastroesophageal reflux disease)   . Vitamin D deficiency   . IBS (irritable bowel syndrome)   . Prediabetes   . Large cell lymphoma       History reviewed. No pertinent past surgical history.  in for   Chief Complaint  Patient presents with  . Leg Injury     HPI This is a very pleasant 74 yo female patient admitted by the orthopedic service after experiencing an open fracture dislocation of the left ankle overnight. The patient reported around 11:30 PM she twisted her ankle while getting out of bed. She thought this was only a minor injury but continued to walk on the ankle until she had severe pain and presented to the hospital. She was discovered to have a significant injury to her ankle as described and plans are to proceed with ORIF today. Patient has been prediabetic for several years and several months ago was started on metformin. The orthopedic service has requested evaluation regarding the management of the patient's diabetes.  Review of Systems   In addition to the HPI above,  No Fever-chills, myalgias or other constitutional symptoms No Headache, changes with Vision or hearing, new weakness, tingling, numbness in any extremity, No problems swallowing food or Liquids, indigestion/reflux No Chest pain, Cough or Shortness of Breath, palpitations, orthopnea or DOE No Abdominal pain, N/V; no melena or hematochezia, no dark tarry stools, Bowel movements are regular, No dysuria,  hematuria or flank pain No new skin rashes, lesions, masses or bruises, No recent weight gain or loss No polyuria, polydypsia or polyphagia,  *A full 10 point Review of Systems was done, except as stated above, all other Review of Systems were negative.  Social History History  Substance Use Topics  . Smoking status: Never Smoker   . Smokeless tobacco: Not on file  . Alcohol Use: No    Family History History reviewed. No pertinent family history.  Prior to Admission medications   Medication Sig Start Date End Date Taking? Authorizing Provider  ALPRAZolam Debra Short) 1 MG tablet Take 1 mg by mouth 2 (two) times daily as needed for anxiety.   Yes Historical Provider, MD  aspirin 81 MG chewable tablet Chew by mouth daily.   Yes Historical Provider, MD  atorvastatin (LIPITOR) 80 MG tablet TAKE 1 TABLET BY MOUTH DAILY FOR CHLOESTEROL. DISCONTINUE PRAVASTATIN Patient taking differently: TAKE 1/3 TABLET BY MOUTH DAILY FOR CHLOESTEROL. DISCONTINUE PRAVASTATIN   Yes Debra Aline, PA-C  bisoprolol-hydrochlorothiazide (ZIAC) 5-6.25 MG per tablet TAKE 1 TABLET BY MOUTH DAILY 06/29/14  Yes Debra Mutters, PA-C  celecoxib (CELEBREX) 200 MG capsule Take 200 mg by mouth daily as needed for mild pain.   Yes Historical Provider, MD  Cholecalciferol (VITAMIN D PO) Take 5,000-10,000 Units by mouth daily. Takes 5,000 units daily, except takes 10,000 units on Mondays, Wednesdays, and Fridays.   Yes Historical Provider, MD  esomeprazole (NEXIUM) 20 MG capsule Take 20 mg by mouth daily as needed (acid reflux).    Yes Historical Provider, MD  FLUoxetine (PROZAC) 40 MG capsule TAKE 1 CAPSULE EVERY DAY FOR MOOD 05/01/14  Yes Unk [name], MD  gabapentin (NEURONTIN) 300 MG capsule Take 300 mg by mouth 2 (two) times daily as needed (pain).  03/12/14  Yes Historical Provider, MD  HYDROcodone-acetaminophen (NORCO/VICODIN) 5-325 MG per tablet Take 0.5 tablets by mouth daily as needed for moderate pain.    Yes Historical  Provider, MD  metFORMIN (GLUCOPHAGE XR) 500 MG 24 hr tablet Take 1 tablet (500 mg total) by mouth daily with breakfast. 06/27/14  Yes Debra Couillard, PA-C  montelukast (SINGULAIR) 10 MG tablet TAKE 1 TABLET EVERY DAY Patient taking differently: TAKE 1 TABLET EVERY DAY AS NEEDED FOR ALLERGIES 02/24/14  Yes Unk [name], MD  Polyethyl Glycol-Propyl Glycol (SYSTANE OP) Place 1 drop into both eyes daily as needed (for dry eyes).   Yes Historical Provider, MD  rizatriptan (MAXALT) 10 MG tablet Take 10 mg by mouth daily as needed for migraine.  04/01/14  Yes Historical Provider, MD  tiZANidine (ZANAFLEX) 4 MG tablet Take 1/2 to 1 tablet tid. Patient taking differently: Take 2-4 mg by mouth every 6 (six) hours as needed for muscle spasms.  09/22/14  Yes Unk [name], MD  triamcinolone (NASACORT) 55 MCG/ACT AERO nasal inhaler Place 2 sprays into the nose daily as needed (for seasonal allergies).    Yes Historical Provider, MD  zolpidem (AMBIEN) 10 MG tablet Take 5 mg by mouth at bedtime.    Yes Historical Provider, MD    Allergies  Allergen Reactions  . Effexor [Venlafaxine] Other (See Comments)    REACTION:  unknown  . Nortriptyline Other (See Comments)    REACTION:  unknown  . Paxil [Paroxetine Hcl] Other (See Comments)    REACTION:  unknown  . Robaxin [Methocarbamol] Itching  . Zoloft [Sertraline Hcl] Other (See Comments)    REACTION: unknown    Physical Exam  Vitals  Blood pressure 130/60, pulse 73, temperature 98.1 F (36.7 C), temperature source Oral, resp. rate 14, height 5\' 7"  (1.702 m), weight 168 lb (76.204 kg), SpO2 100 %.   General:  In no acute distress, appears healthy and well nourished and younger than stated age  Psych:  Normal affect, Denies Suicidal or Homicidal ideations, Awake Alert, Oriented X 3. Speech and thought patterns are clear and appropriate, no apparent short term memory deficits  Neuro:   No focal neurological deficits, CN II through XII intact,  Strength 5/5 all 4 extremities, Sensation intact all 4 extremities.  ENT:  Ears and Eyes appear Normal, Conjunctivae clear, PER. Moist oral mucosa without erythema or exudates.  Neck:  Supple, No lymphadenopathy appreciated  Respiratory:  Symmetrical chest wall movement, Good air movement bilaterally, CTAB. Room Air  Cardiac:  RRR, No Murmurs, no LE edema noted, no JVD, No carotid bruits, peripheral pulses palpable at 2+  Abdomen:  Positive bowel sounds, Soft, Non tender, Non distended,  No masses appreciated, no obvious hepatosplenomegaly  Skin:  No Cyanosis, Normal Skin Turgor, No Skin Rash or Bruise.  Extremities: Upper extremities are symmetrical and atraumatic,  left lower extremity now has splint cast in place. Right lower extremity unremarkable  Data Review  CBC  Recent Labs Lab 09/26/14 0507  WBC 9.7  HGB 11.0*  HCT 34.7*  PLT 285  MCV 87.6  MCH 27.8  MCHC 31.7  RDW 14.4  LYMPHSABS 2.3  MONOABS 0.9  EOSABS 0.2  BASOSABS 0.0    Chemistries   Recent Labs Lab 09/26/14 0507  NA 139  K 4.0  CL 101  CO2 25  GLUCOSE 141*  BUN 11  CREATININE 0.87  CALCIUM 9.1    estimated creatinine clearance is 58.5 mL/min (by C-G formula based on Cr of 0.87).  No results for input(s): TSH, T4TOTAL, T3FREE, THYROIDAB in the last 72 hours.  Invalid input(s): FREET3  Coagulation profile No results for input(s): INR, PROTIME in the last 168 hours.  No results for input(s): DDIMER in the last 72 hours.  Cardiac Enzymes No results for input(s): CKMB, TROPONINI, MYOGLOBIN in the last 168 hours.  Invalid input(s): CK  Invalid input(s): POCBNP  Urinalysis No results found for: COLORURINE, APPEARANCEUR, LABSPEC, PHURINE, GLUCOSEU, HGBUR, BILIRUBINUR, KETONESUR, PROTEINUR, UROBILINOGEN, NITRITE, LEUKOCYTESUR  Imaging results:   Dg Chest Port 1 View  09/26/2014   CLINICAL DATA:  Hypoxia. Preop respiratory exam left ankle fracture dislocation.  EXAM: PORTABLE CHEST - 1  VIEW  COMPARISON:  06/17/2008  FINDINGS: Mild cardiomegaly is noted. Low lung volumes. No evidence of pulmonary infiltrate or edema. No evidence of pleural effusion.  IMPRESSION: Mild cardiomegaly and low lung volumes.  No acute findings.   Electronically Signed   By: Earle Gell M.D.   On: 09/26/2014 08:00   Dg Tibia/fibula Left Port  09/26/2014   CLINICAL DATA:  Open ankle fracture  EXAM: PORTABLE LEFT TIBIA AND FIBULA - 2 VIEW  COMPARISON:  None.  FINDINGS: Laterally dislocated ankle with medial malleolus and posterior malleolus fractures. The distal fragments remain in continuity with the talus. There is no gross hindfoot fracturing. Subcutaneous gas extends to the level of the mid shin.  IMPRESSION: Open bimalleolar ankle fracture with lateral tibiotalar dislocation.   Electronically Signed   By: Monte Fantasia M.D.   On: 09/26/2014 06:07   Mm Screening Breast Tomo Bilateral  09/24/2014   CLINICAL DATA:  Screening. Implant explantation 1995.  EXAM: DIGITAL SCREENING BILATERAL MAMMOGRAM WITH 3D TOMO WITH CAD  COMPARISON:  Previous exam(s).  ACR Breast Density Category b: There are scattered areas of fibroglandular density.  FINDINGS: There are no findings suspicious for malignancy. Images were processed with CAD. Explantation changes are reidentified.  IMPRESSION: No mammographic evidence of malignancy. A result letter of this screening mammogram will be mailed directly to the patient.  RECOMMENDATION: Screening mammogram in one year. (Code:SM-B-01Y)  BI-RADS CATEGORY  2: Benign.   Electronically Signed   By: Conchita Paris M.D.   On: 09/24/2014 13:39     EKG: Sinus rhythm with PACs, possible remote anterior septal infarct based on EKG criteria   Assessment & Plan  Principal Problem:   Diabetes mellitus type 2, controlled -Hemoglobin A1c January 2016 was 6.4 -Serum glucose today was 141 -We'll hold metformin acutely for at least 48 hours and can reevaluate resumption of this medication in the  postop setting after 48 hours -Begin checking CBGs and provide sliding scale insulin/density of scale -At this juncture patient's diabetes is well controlled -Please ensure that postoperatively she is started on a carbohydrate modified diet and avoid dextrose containing IV fluids -This patient's diabetes as mentioned  is well controlled; please call his back if we can be of assistance during the hospitalization  Active Problems:   Open fracture and dislocation of left ankle -Plans are for surgical procedure today by admitting team    Hypertension -Blood pressure controlled -Patient on combination beta blocker and thiazide diuretic prior to admission -Chest x-ray revealed mild cardiomegaly and low lung volumes noting mild cardiomegaly can be expected in single view chest x-rays    Vitamin D deficiency -Patient endorses she has not had a DEXA scan yet -Patient takes vitamin D orally at home but is not on any calcium supplementation -Since orthopedic injury may be related to osteoporosis, recommend that this patient follow-up with her primary care physician for further screening and management    Hyperlipidemia -Stable    Arthritis -Stable    GERD -Continue PPI-no symptoms     Condition:  Stable  Time spent in minutes : 60   Kathrina Crosley L. ANP on 09/26/2014 at 8:32 AM  Between 7am to 7pm - Pager - [phone]  After 7pm go to [email or web address] - password [email or web address]  And look for the night coverage person covering me after hours  [facility]

## 2014-09-26 NOTE — Brief Op Note (Signed)
09/26/2014  6:19 PM  PATIENT:  Debra Short  74 y.o. female  PRE-OPERATIVE DIAGNOSIS:  fx dislocation ankle  POST-OPERATIVE DIAGNOSIS:  fx dislocation left ankle  PROCEDURE:  Procedure(s): OPEN REDUCTION INTERNAL FIXATION (ORIF) ANKLE FRACTURE (Left)  SURGEON:  Surgeon(s) and Role:    * [name], MD - Primary  PHYSICIAN ASSISTANT:   ASSISTANTS: bethune   ANESTHESIA:   general  EBL:  Total I/O In: 3250 [I.V.:3000; IV Piggyback:250] Out: 730 [Urine:700; Blood:30]  BLOOD ADMINISTERED:none  DRAINS: Penrose drain in the medial sub Q   LOCAL MEDICATIONS USED:  NONE  SPECIMEN:  No Specimen  DISPOSITION OF SPECIMEN:  N/A  COUNTS:  YES  TOURNIQUET:   Total Tourniquet Time Documented: Thigh (Right) - 93 minutes Total: Thigh (Right) - 93 minutes   DICTATION: .Other Dictation: Dictation Number [id number]  PLAN OF CARE: Admit to inpatient   PATIENT DISPOSITION:  PACU - hemodynamically stable.   Delay start of Pharmacological VTE agent (>24hrs) due to surgical blood loss or risk of bleeding: no

## 2014-09-26 NOTE — Anesthesia Procedure Notes (Addendum)
Procedure Name: LMA Insertion Date/Time: 09/26/2014 9:57 AM Performed by: [place] Pink Pre-anesthesia Checklist: Patient identified, Emergency Drugs available, Suction available, Patient being monitored and Timeout performed Patient Re-evaluated:Patient Re-evaluated prior to inductionOxygen Delivery Method: Circle system utilized Preoxygenation: Pre-oxygenation with 100% oxygen Intubation Type: IV induction LMA: LMA inserted LMA Size: 4.0 Number of attempts: 1 Placement Confirmation: positive ETCO2 and breath sounds checked- equal and bilateral Tube secured with: Tape Dental Injury: Teeth and Oropharynx as per pre-operative assessment    Anesthesia Regional Block:  Popliteal block  Pre-Anesthetic Checklist: ,, timeout performed, Correct Patient, Correct Site, Correct Laterality, Correct Procedure, Correct Position, site marked, Risks and benefits discussed,  Surgical consent,  Pre-op evaluation,  At surgeon's request and post-op pain management  Laterality: Left  Prep: chloraprep       Needles:  Injection technique: Single-shot  Needle Type: Echogenic Stimulator Needle     Needle Length: 9cm 9 cm Needle Gauge: 22 and 22 G    Additional Needles:  Procedures: ultrasound guided (picture in chart) Popliteal block Narrative:  Start time: 09/26/2014 9:45 AM End time: 09/26/2014 9:50 AM Injection made incrementally with aspirations every 5 mL.  Performed by: Personally   Additional Notes: 30 cc 0.5% marcaine 1:200 epi injected easily

## 2014-09-26 NOTE — H&P (Signed)
PREOPERATIVE H&P  Chief Complaint: the patient fell and has a fracture dislocation of the ankle  HPI: Debra Short is a 74 y.o. female who presents for evaluation of open fracture dislocation of the ankle. It has been present for several hours and has been worsening. She has failed conservative measures. Pain is rated as severe.  Past Medical History  Diagnosis Date  . Hyperlipidemia   . Hypertension   . Arthritis   . GERD (gastroesophageal reflux disease)   . Vitamin D deficiency   . IBS (irritable bowel syndrome)   . Prediabetes   . Large cell lymphoma    History reviewed. No pertinent past surgical history. History   Social History  . Marital Status: Widowed    Spouse Name: N/A  . Number of Children: N/A  . Years of Education: N/A   Social History Main Topics  . Smoking status: Never Smoker   . Smokeless tobacco: Not on file  . Alcohol Use: No  . Drug Use: No  . Sexual Activity: Not on file   Other Topics Concern  . None   Social History Narrative   History reviewed. No pertinent family history. Allergies  Allergen Reactions  . Effexor [Venlafaxine] Other (See Comments)    REACTION:  unknown  . Nortriptyline Other (See Comments)    REACTION:  unknown  . Paxil [Paroxetine Hcl] Other (See Comments)    REACTION:  unknown  . Robaxin [Methocarbamol] Itching  . Zoloft [Sertraline Hcl] Other (See Comments)    REACTION: unknown   Prior to Admission medications   Medication Sig Start Date End Date Taking? Authorizing Provider  ALPRAZolam Duanne Moron) 1 MG tablet Take 1 mg by mouth 2 (two) times daily as needed for anxiety.   Yes Historical Provider, MD  aspirin 81 MG chewable tablet Chew by mouth daily.   Yes Historical Provider, MD  atorvastatin (LIPITOR) 80 MG tablet TAKE 1 TABLET BY MOUTH DAILY FOR CHLOESTEROL. DISCONTINUE PRAVASTATIN Patient taking differently: TAKE 1/3 TABLET BY MOUTH DAILY FOR CHLOESTEROL. DISCONTINUE PRAVASTATIN   Yes [name], PA-C   bisoprolol-hydrochlorothiazide [facility]) 5-6.25 MG per tablet TAKE 1 TABLET BY MOUTH DAILY 06/29/14  Yes [name], PA-C  celecoxib (CELEBREX) 200 MG capsule Take 200 mg by mouth daily as needed for mild pain.   Yes Historical Provider, MD  Cholecalciferol (VITAMIN D PO) Take 5,000-10,000 Units by mouth daily. Takes 5,000 units daily, except takes 10,000 units on Mondays, Wednesdays, and Fridays.   Yes Historical Provider, MD  esomeprazole (NEXIUM) 20 MG capsule Take 20 mg by mouth daily as needed (acid reflux).    Yes Historical Provider, MD  FLUoxetine (PROZAC) 40 MG capsule TAKE 1 CAPSULE EVERY DAY FOR MOOD 05/01/14  Yes Unk [name], MD  gabapentin (NEURONTIN) 300 MG capsule Take 300 mg by mouth 2 (two) times daily as needed (pain).  03/12/14  Yes Historical Provider, MD  HYDROcodone-acetaminophen (NORCO/VICODIN) 5-325 MG per tablet Take 0.5 tablets by mouth daily as needed for moderate pain.    Yes Historical Provider, MD  metFORMIN (GLUCOPHAGE XR) 500 MG 24 hr tablet Take 1 tablet (500 mg total) by mouth daily with breakfast. 06/27/14  Yes [name], PA-C  montelukast (SINGULAIR) 10 MG tablet TAKE 1 TABLET EVERY DAY Patient taking differently: TAKE 1 TABLET EVERY DAY AS NEEDED FOR ALLERGIES 02/24/14  Yes Unk [name], MD  Polyethyl Glycol-Propyl Glycol (SYSTANE OP) Place 1 drop into both eyes daily as needed (for dry eyes).   Yes Historical Provider, MD  rizatriptan (MAXALT) 10 MG tablet Take 10 mg by mouth daily as needed for migraine.  04/01/14  Yes Historical Provider, MD  tiZANidine (ZANAFLEX) 4 MG tablet Take 1/2 to 1 tablet tid. Patient taking differently: Take 2-4 mg by mouth every 6 (six) hours as needed for muscle spasms.  09/22/14  Yes Unk [name], MD  triamcinolone (NASACORT) 55 MCG/ACT AERO nasal inhaler Place 2 sprays into the nose daily as needed (for seasonal allergies).    Yes Historical Provider, MD  zolpidem (AMBIEN) 10 MG tablet Take 5 mg by mouth at bedtime.     Yes Historical Provider, MD     Positive ROS: none  All other systems have been reviewed and were otherwise negative with the exception of those mentioned in the HPI and as above.  Physical Exam: Filed Vitals:   09/26/14 0545  BP: 104/36  Pulse: 68  Temp:   Resp:     General: Alert, no acute distress Cardiovascular: No pedal edema Respiratory: No cyanosis, no use of accessory musculature GI: No organomegaly, abdomen is soft and non-tender Skin: No lesions in the area of chief complaint Neurologic: Sensation intact distally Psychiatric: Patient is competent for consent with normal mood and affect Lymphatic: No axillary or cervical lymphadenopathy  MUSCULOSKELETAL: ankle: Obvious open wound severe with exposed articular surface.  Neurovascular intact distally. X-ray: Fracture dislocation of the ankle with bimalleolar type ankle fracture. Assessment/Plan: Patient will need open reduction internal fixation with irrigation and debridement of wounds on urgent basis.  I will plan to get to this one the operating room is available.  I'll get medicine involved to help control diabetes in the postoperative period.  The risks benefits and alternatives were discussed with the patient including but not limited to the risks of nonoperative treatment, versus surgical intervention including infection, bleeding, nerve injury, malunion, nonunion, hardware prominence, hardware failure, need for hardware removal, blood clots, cardiopulmonary complications, morbidity, mortality, among others, and they were willing to proceed.  Predicted outcome is good, although there will be at least a six to nine month expected recovery.  Arlenis Blaydes L, MD 09/26/2014 6:29 AM

## 2014-09-26 NOTE — ED Notes (Signed)
Graves at the bedside, ortho tech paged.

## 2014-09-26 NOTE — ED Provider Notes (Signed)
CSN: [id number]     Arrival date & time 09/26/14  0447 History   None    No chief complaint on file.    (Consider location/radiation/quality/duration/timing/severity/associated sxs/prior Treatment) Patient is a 74 y.o. female presenting with leg pain.  Leg Pain Location:  Ankle Time since incident:  4 hours Injury: yes   Mechanism of injury: fall   Fall:    Fall occurred: from standing with eversion. Ankle location:  L ankle Pain details:    Quality:  Aching   Radiates to:  Does not radiate   Severity:  Moderate   Onset quality:  Sudden   Duration:  4 hours   Timing:  Constant   Progression:  Unchanged Chronicity:  New Dislocation: yes   Tetanus status:  Unknown Prior injury to area:  No Relieved by:  Nothing Worsened by:  Bearing weight, flexion, adduction, abduction, activity and extension Associated symptoms: decreased ROM, stiffness and tingling   Associated symptoms: no back pain, no numbness and no swelling     Past Medical History  Diagnosis Date  . Hyperlipidemia   . Hypertension   . Arthritis   . GERD (gastroesophageal reflux disease)   . Vitamin D deficiency   . IBS (irritable bowel syndrome)   . Prediabetes   . Large cell lymphoma    No past surgical history on file. No family history on file. History  Substance Use Topics  . Smoking status: Never Smoker   . Smokeless tobacco: Not on file  . Alcohol Use: No   OB History    No data available     Review of Systems  Musculoskeletal: Positive for stiffness. Negative for back pain.  All other systems reviewed and are negative.     Allergies  Effexor; Nortriptyline; Paxil; Robaxin; and Zoloft  Home Medications   Prior to Admission medications   Medication Sig Start Date End Date Taking? Authorizing Provider  aspirin 81 MG chewable tablet Chew by mouth daily.    Historical Provider, MD  atorvastatin (LIPITOR) 80 MG tablet TAKE 1 TABLET BY MOUTH DAILY FOR CHLOESTEROL. DISCONTINUE  PRAVASTATIN Patient not taking: Reported on 06/26/2014    [name], PA-C  bisoprolol-hydrochlorothiazide [facility]) 5-6.25 MG per tablet TAKE 1 TABLET BY MOUTH DAILY 06/29/14   [name], PA-C  Cholecalciferol (VITAMIN D PO) Take 5,000-10,000 Units by mouth daily. Takes 5,000 units daily, except takes 10,000 units on Mondays, Wednesdays, and Fridays.    Historical Provider, MD  esomeprazole (NEXIUM) 20 MG capsule Take 20 mg by mouth daily at 12 noon.    Historical Provider, MD  FLUoxetine (PROZAC) 40 MG capsule TAKE 1 CAPSULE EVERY DAY FOR MOOD 05/01/14   Unk [name], MD  gabapentin (NEURONTIN) 300 MG capsule Take 300 mg by mouth 2 (two) times daily. 03/12/14   Historical Provider, MD  HYDROcodone-acetaminophen (NORCO/VICODIN) 5-325 MG per tablet Take 0.5 tablets by mouth daily as needed for moderate pain.     Historical Provider, MD  metFORMIN (GLUCOPHAGE XR) 500 MG 24 hr tablet Take 1 tablet (500 mg total) by mouth daily with breakfast. 06/27/14   [name], PA-C  montelukast (SINGULAIR) 10 MG tablet TAKE 1 TABLET EVERY DAY Patient not taking: Reported on 06/26/2014 02/24/14   Unk [name], MD  Polyethyl Glycol-Propyl Glycol (SYSTANE OP) Place 1 drop into both eyes daily as needed (for dry eyes).    Historical Provider, MD  rizatriptan (MAXALT) 10 MG tablet  04/01/14   Historical Provider, MD  tiZANidine (ZANAFLEX) 4 MG tablet  Take 1/2 to 1 tablet tid. 09/22/14   Unk [name], MD  triamcinolone (NASACORT) 55 MCG/ACT AERO nasal inhaler Place 2 sprays into the nose daily as needed (for seasonal allergies).     Historical Provider, MD  zolpidem (AMBIEN) 10 MG tablet Take 5 mg by mouth at bedtime as needed for sleep.    Historical Provider, MD   There were no vitals taken for this visit. Physical Exam  Constitutional: She is oriented to person, place, and time. She appears well-developed and well-nourished.  HENT:  Head: Normocephalic and atraumatic.  Right Ear: External ear  normal.  Left Ear: External ear normal.  Eyes: Conjunctivae and EOM are normal. Pupils are equal, round, and reactive to light.  Neck: Normal range of motion. Neck supple.  Cardiovascular: Normal rate, regular rhythm, normal heart sounds and intact distal pulses.   Pulmonary/Chest: Effort normal and breath sounds normal.  Abdominal: Soft. Bowel sounds are normal. There is no tenderness.  Musculoskeletal: Normal range of motion.  Open L ankle through medial aspect.  1+ palpable DP (confirmed by doppler with 3 sec cap refill), overlying laceration at PT.    Neurological: She is alert and oriented to person, place, and time.  Skin: Skin is warm and dry.  Vitals reviewed.   ED Course  Procedures (including critical care time) Labs Review Labs Reviewed  CBC WITH DIFFERENTIAL/PLATELET  BASIC METABOLIC PANEL    Imaging Review Mm Screening Breast Tomo Bilateral  09/24/2014   CLINICAL DATA:  Screening. Implant explantation 1995.  EXAM: DIGITAL SCREENING BILATERAL MAMMOGRAM WITH 3D TOMO WITH CAD  COMPARISON:  Previous exam(s).  ACR Breast Density Category b: There are scattered areas of fibroglandular density.  FINDINGS: There are no findings suspicious for malignancy. Images were processed with CAD. Explantation changes are reidentified.  IMPRESSION: No mammographic evidence of malignancy. A result letter of this screening mammogram will be mailed directly to the patient.  RECOMMENDATION: Screening mammogram in one year. (Code:SM-B-01Y)  BI-RADS CATEGORY  2: Benign.   Electronically Signed   By: Conchita Paris M.D.   On: 09/24/2014 13:39     EKG Interpretation None      MDM   Final diagnoses:  Open left ankle fracture  Open left ankle fracture    74 y.o. female with pertinent PMH of DM presents with open L ankle fracture.  Bimal present.  Consulted ortho for admission who requested medicine consultation.  Spoke with [facility] for consultation.  Ancef given, tdap updated.  Pt taken to  OR  I have reviewed all laboratory and imaging studies if ordered as above  1. Open left ankle fracture   2. Open left ankle fracture         [name], MD 09/26/14 2257

## 2014-09-26 NOTE — Anesthesia Postprocedure Evaluation (Signed)
  Anesthesia Post-op Note  Patient: Debra Short  Procedure(s) Performed: Procedure(s): OPEN REDUCTION INTERNAL FIXATION (ORIF) ANKLE FRACTURE (Left)  Patient Location: PACU  Anesthesia Type:General and GA combined with regional for post-op pain  Level of Consciousness: awake, alert  and oriented  Airway and Oxygen Therapy: Patient Spontanous Breathing and Patient connected to nasal cannula oxygen  Post-op Pain: none  Post-op Assessment: Post-op Vital signs reviewed, Patient's Cardiovascular Status Stable, Respiratory Function Stable and Patent Airway  Post-op Vital Signs: stable  Last Vitals:  Filed Vitals:   09/26/14 1515  BP:   Pulse: 87  Temp:   Resp: 13    Complications: No apparent anesthesia complications

## 2014-09-26 NOTE — ED Notes (Signed)
Portable x-ray at the bedside.  

## 2014-09-26 NOTE — Anesthesia Preprocedure Evaluation (Signed)
Anesthesia Evaluation  Patient identified by MRN, date of birth, ID band Patient awake    Reviewed: Allergy & Precautions, NPO status , Patient's Chart, lab work & pertinent test results  Airway Mallampati: II  TM Distance: >3 FB Neck ROM: Full    Dental  (+) Teeth Intact   Pulmonary  breath sounds clear to auscultation        Cardiovascular hypertension, Rhythm:Regular Rate:Normal     Neuro/Psych    GI/Hepatic   Endo/Other    Renal/GU      Musculoskeletal   Abdominal   Peds  Hematology   Anesthesia Other Findings   Reproductive/Obstetrics                             Anesthesia Physical Anesthesia Plan  ASA: II  Anesthesia Plan: General   Post-op Pain Management: MAC Combined w/ Regional for Post-op pain   Induction: Intravenous  Airway Management Planned: Oral ETT  Additional Equipment:   Intra-op Plan:   Post-operative Plan: Extubation in OR  Informed Consent: I have reviewed the patients History and Physical, chart, labs and discussed the procedure including the risks, benefits and alternatives for the proposed anesthesia with the patient or authorized representative who has indicated his/her understanding and acceptance.   Dental advisory given  Plan Discussed with: CRNA and Anesthesiologist  Anesthesia Plan Comments:         Anesthesia Quick Evaluation

## 2014-09-26 NOTE — ED Notes (Signed)
Karin Lieu (Son) number provided by the patient. [phone]. Patient states the last time she ate a meal was around 5pm last night.

## 2014-09-26 NOTE — Consult Note (Addendum)
   Full note to follow  -Please see full dictated note by Mrs. Erin Hearing  Briefly this is a very pleasant younger than appearing 74 year old female history of osteoporosis, lady with reflux, prior history of large cell lymphoma #[I have no records], irritable bowel syndrome, vitamin D deficiency, hyperlipidemia prediabetic who recently had an A1c of 6.4 and was started on metformin about 2 months. She got out of bed at 11 PM 4/14, felt that she rolled her ankle and suffered in pain until 4 AM when she stood up again and then noticed that her bone was protruding out of her left ankle. She called emergency physicians and came over to the hospital and will need imminent surgery under Dr. [name]. We were consulted for diabetes mellitus management  EOMI NCAT pleasant, No pallor no icterus,? Cataract surgery right side No JVD, no bruit Chest poor exam as laying flat and difficulty turning but clear to my exam S1-S2 no murmur rub or gallop, regular rate rhythm Abdomen soft nontender.  Neuro exam deferred Left leg wrapped in multiple bandages, pulses   Brief plan  Hold metformin until 24-48 hours after surgery-she has no renal insufficiency Recommend sliding scale coverage with sensitive coverage every 4 hourly if nothing by mouth--subsequently can discuss in 24-48 hrs hours restarting metformin monotherapy Recommend diabetic diet Estimated Risk Probability for Perioperative Myocardial Infarction or Cardiac Arrest according to [place] is very low at 0.76 % despite ASA class 2-3   We will sign off and hospitalist service will not follow this patient subsequently Please contact floor manager [phone] if there are questions regarding this patient subsequently   Verneita Griffes, MD [facility] ([phone]

## 2014-09-26 NOTE — ED Notes (Signed)
CBG:90 

## 2014-09-27 LAB — GLUCOSE, CAPILLARY
Glucose-Capillary: 112 mg/dL — ABNORMAL HIGH (ref 70–99)
Glucose-Capillary: 88 mg/dL (ref 70–99)
Glucose-Capillary: 105 mg/dL — ABNORMAL HIGH (ref 70–99)
Glucose-Capillary: 100 mg/dL — ABNORMAL HIGH (ref 70–99)

## 2014-09-27 LAB — BASIC METABOLIC PANEL
Anion gap: 11 (ref 5–15)
BUN: 7 mg/dL (ref 6–23)
CO2: 28 mmol/L (ref 19–32)
Calcium: 8.6 mg/dL (ref 8.4–10.5)
Chloride: 99 mmol/L (ref 96–112)
Creatinine, Ser: 0.76 mg/dL (ref 0.50–1.10)
GFR calc Af Amer: >90 mL/min (ref >90)
GFR calc non Af Amer: 80 mL/min — ABNORMAL LOW (ref >90)
Glucose, Bld: 112 mg/dL — ABNORMAL HIGH (ref 70–99)
Potassium: 4 mmol/L (ref 3.5–5.1)
Sodium: 138 mmol/L (ref 135–145)

## 2014-09-27 MED ORDER — OXYCODONE-ACETAMINOPHEN 5-325 MG PO TABS
1.0000 | ORAL_TABLET | ORAL | Status: DC | PRN
Start: 2014-09-27 — End: 2014-09-30
  Administered 2014-09-27: 2 via ORAL
  Administered 2014-09-27: 1 via ORAL
  Administered 2014-09-28 – 2014-09-30 (×7): 2 via ORAL
  Filled 2014-09-27: qty 1
  Filled 2014-09-27 (×8): qty 2

## 2014-09-27 MED ORDER — DIPHENHYDRAMINE HCL 25 MG PO CAPS: 25.0000 mg | ORAL_CAPSULE | Freq: Four times a day (QID) | ORAL | Status: DC | PRN

## 2014-09-27 NOTE — Progress Notes (Signed)
Physical Therapy Evaluation Patient Details Name: Debra Short MRN: [id number] DOB: 1941-05-08 Today's Date: 09/27/2014   History of Present Illness  74 yo female post mechanical fall and bi-malleolar ankle fx, post ORIF 09/26/14.    Clinical Impression  Patient with pain, but declines pain medication prior to evaluation.  Good effort and compliance with NWB L LE during evaluation, MIN Assist overall for room level mobility with cues for technique and safety due to WB status.  Patient is home alone and needs to be Independent to return to prior level, at this time requires 24 hour assist, but will benefit from skilled PT services to instruct with safety and mobility.    Follow Up Recommendations CIR;SNF    Equipment Recommendations  Rolling walker with 5" wheels (will continue to assess)    Recommendations for Other Services Rehab consult;OT consult     Precautions / Restrictions Precautions Precautions: Fall Restrictions Weight Bearing Restrictions: Yes LLE Weight Bearing: Non weight bearing      Mobility  Bed Mobility Overal bed mobility: Needs Assistance Bed Mobility: Supine to Sit     Supine to sit: Min assist     General bed mobility comments: slow, use of bed rails  Transfers Overall transfer level: Needs assistance Equipment used: Rolling walker (2 wheeled) Transfers: Sit to/from Stand Sit to Stand: Min assist;From elevated surface         General transfer comment: Cues for WB and hand positioning  Ambulation/Gait Ambulation/Gait assistance: Min assist Ambulation Distance (Feet): 15 Feet Assistive device: Rolling walker (2 wheeled) Gait Pattern/deviations: Antalgic (L LE NWB = 'Hop' on R LE.)        Stairs            Wheelchair Mobility    Modified Rankin (Stroke Patients Only)       Balance Overall balance assessment: Needs assistance   Sitting balance-Leahy Scale: Fair     Standing balance support: Bilateral upper extremity  supported Standing balance-Leahy Scale: Poor                               Pertinent Vitals/Pain Pain Assessment: 0-10 Pain Score: 7  Pain Location: L ankle and foot Pain Intervention(s): Monitored during session;Limited activity within patient's tolerance    Home Living Family/patient expects to be discharged to:: Private residence (May benefit from CIR/SNF prior to return home.) Living Arrangements: Alone Available Help at Discharge: Family;Friend(s);Available PRN/intermittently Type of Home: House Home Access: Stairs to enter   [employer] of Steps: 2 Home Layout: One level Home Equipment: None      Prior Function Level of Independence: Independent               Hand Dominance        Extremity/Trunk Assessment   Upper Extremity Assessment: Overall WFL for tasks assessed;Defer to OT evaluation           Lower Extremity Assessment: LLE deficits/detail   LLE Deficits / Details: Pain and NWB L LE, ankle in splint.     Communication   Communication: No difficulties  Cognition Arousal/Alertness: Awake/alert Behavior During Therapy: WFL for tasks assessed/performed Overall Cognitive Status: Within Functional Limits for tasks assessed                      General Comments      Exercises        Assessment/Plan    PT Assessment Patient needs  continued PT services  PT Diagnosis Difficulty walking;Acute pain   PT Problem List Decreased activity tolerance;Decreased mobility;Decreased knowledge of use of DME;Pain  PT Treatment Interventions DME instruction;Gait training;Stair training;Functional mobility training;Therapeutic activities;Patient/family education;Wheelchair mobility training   PT Goals (Current goals can be found in the Care Plan section) Acute Rehab PT Goals Patient Stated Goal: Not specifically stated, to be independent again. PT Goal Formulation: With patient Time For Goal Achievement: 10/11/14 Potential  to Achieve Goals: Good    Frequency Min 5X/week   Barriers to discharge Decreased caregiver support;Inaccessible home environment 2 step entry, inconsistent assist available.    Co-evaluation               End of Session Equipment Utilized During Treatment: Gait belt Activity Tolerance: Patient tolerated treatment well;Patient limited by fatigue Patient left: in bed;with call bell/phone within reach;with nursing/sitter in room Nurse Communication: Mobility status;Patient requests pain meds;Weight bearing status         Time: 9528-4132 PT Time Calculation (min) (ACUTE ONLY): 45 min   Charges:   PT Evaluation $Initial PT Evaluation Tier I: 1 Procedure PT Treatments $Gait Training: 8-22 mins $Therapeutic Activity: 8-22 mins   PT G Codes:        Pati Thinnes L 10/22/2014, 11:36 AM

## 2014-09-27 NOTE — Progress Notes (Signed)
Subjective: 1 Day Post-Op Procedure(s) (LRB): OPEN REDUCTION INTERNAL FIXATION (ORIF) ANKLE FRACTURE (Left)  Activity level:  Nonweightbearing left leg Diet tolerance:  ok Voiding:  ok Patient reports pain as mild and moderate.    Objective: Vital signs in last 24 hours: Temp:  [97.4 F (36.3 C)-98.3 F (36.8 C)] 98 F (36.7 C) (04/16 0524) Pulse Rate:  [72-95] 80 (04/16 0524) Resp:  [11-32] 16 (04/16 0524) BP: (109-156)/(22-94) 116/49 mmHg (04/16 0524) SpO2:  [93 %-100 %] 98 % (04/16 0524)  Labs:  Recent Labs  09/26/14 0507  HGB 11.0*    Recent Labs  09/26/14 0507  WBC 9.7  RBC 3.96  HCT 34.7*  PLT 285    Recent Labs  09/26/14 0507  NA 139  K 4.0  CL 101  CO2 25  BUN 11  CREATININE 0.87  GLUCOSE 141*  CALCIUM 9.1   No results for input(s): LABPT, INR in the last 72 hours.  Physical Exam:  Neurologically intact ABD soft Neurovascular intact Sensation intact distally Incision: dressing C/D/I and no drainage Compartment soft  Assessment/Plan:  1 Day Post-Op Procedure(s) (LRB): OPEN REDUCTION INTERNAL FIXATION (ORIF) ANKLE FRACTURE (Left) Advance diet Up with therapy D/C IV fluids Plan for discharge tomorrow Discharge home with home health  We will remove drain tomorrow prior to discharge. Continue IV abx for 48hrs post op. She will be d/c home on oral keflex. Follow up in office 7-10 days.   Nettie Cromwell, Larwance Sachs 09/27/2014, 7:14 AM

## 2014-09-27 NOTE — Progress Notes (Signed)
Rehab Admissions Coordinator Note:  Patient was screened by Cleatrice Burke for appropriateness for an Inpatient Acute Rehab Consult per PT recommendation.  At this time, we are recommending [place] or SNF if pt felt not to be able to return home alone. Pt would not meet the need with medical neccessity for an in hospital rehab admission for an ankle fracture.  Cleatrice Burke 09/27/2014, 11:52 AM  I can be reached at [phone].

## 2014-09-27 NOTE — Op Note (Signed)
Debra Short, Debra Short NO.:  [id number]  MEDICAL RECORD NO.:  [id number]  LOCATION:  [id number]                        FACILITY:  [facility]  PHYSICIAN:  Alta Corning, M.D.   DATE OF BIRTH:  03/06/1941  DATE OF PROCEDURE:  09/26/2014 DATE OF DISCHARGE:                              OPERATIVE REPORT   PREOPERATIVE DIAGNOSIS:  Grade 2 open fracture dislocation of the right ankle.  POSTOPERATIVE DIAGNOSIS:  Grade 2 open fracture dislocation of the right ankle.  PRINCIPAL PROCEDURE: 1. Thorough irrigation and debridement of open fracture and open joint     with debridement of skin, subcutaneous tissue, muscle, fascia, and     bone. 2. Open reduction and internal fixation of bimalleolar ankle fracture     with 2 medial screws and a 7-hole 1/3rd tubular plate on the     lateral side. 3. Interpretation of multiple intraoperative fluoroscopic images.  SURGEON:  Alta Corning, M.D.  Terrence DupontModena Slater.  ANESTHESIA:  General.  BRIEF HISTORY:  Mrs. Cowman is a 74 year old female with a history of falling in her house.  She suffered a severe grade 2 open fracture dislocation of the ankle with the tibia, basically is sticking out of the skin completely.  She was evaluated by me in the emergency room and because of the severe nature of her injury, I irrigated this thoroughly in the emergency room and did a manipulative closed reduction of the dislocated ankle to try to get the scan and vascular situation under good control.  Vascular situation came under control.  She had much better look to the foot at this point.  She was splinted and to be taken to the operating room when time is available.  She was brought to the operating room for open reduction and internal fixation procedure.  DESCRIPTION OF PROCEDURE:  The patient was taken to the operating room. After adequate anesthesia was obtained with general anesthetic, the patient was placed supine on the operating table.   The right leg was then examined and irrigated thoroughly with pulsatile lavage irrigation x3 L.  The joint was irrigated.  The fractured fragments were irrigated. We debrided skin, subcutaneous tissue, muscle, fascia, and bone at the site of this open fracture.  Once this was copiously and thoroughly lavaged, then the manipulative closed reduction was undertaken.  Two guidewires for cannulated screws were placed medially.  These cannulated screws were placed over this guidewire and excellent anatomic fixation was achieved of the medial malleolus.  Attention was then turned laterally where an incision was made and subcutaneous tissue down the level of the fibula.  The fibula was then identified, cleansed of healing elements.  Manipulative closed reduction was undertaken and a 1/3rd tubular plate was placed after an interfragmentary screw 7-hole plate was placed with 3 screws in the distal fragment and 3 screws in the proximal fragment and interfragmentary screw.  This was evaluated fluoroscopically and once it was anatomically, the lateral wound was irrigated and closed in layers.  The 6 cm medial wound was now addressed and closed with interrupted sutures over a Penrose drain, loosely closed, but was able to come together fairly nicely.  At this point,  a sterile compressive dressing was applied.  The patient was placed into a U and a posterior splint.  She was taken to the recovery where she was noted to be in satisfactory condition.  Estimated blood loss for the procedure was amenable.     Alta Corning, M.D.     Corliss Skains  D:  09/26/2014  T:  09/26/2014  Job:  [id number]  cc:   Alta Corning, M.D.

## 2014-09-28 LAB — GLUCOSE, CAPILLARY
Glucose-Capillary: 113 mg/dL — ABNORMAL HIGH (ref 70–99)
Glucose-Capillary: 109 mg/dL — ABNORMAL HIGH (ref 70–99)
Glucose-Capillary: 97 mg/dL (ref 70–99)
Glucose-Capillary: 99 mg/dL (ref 70–99)

## 2014-09-28 NOTE — Progress Notes (Signed)
Subjective: 2 Days Post-Op Procedure(s) (LRB): OPEN REDUCTION INTERNAL FIXATION (ORIF) ANKLE FRACTURE (Left)   Patient states that she is feeling much better today. She [place] up and eating breakfast this morning. She has been urinating herself a little but has not been feeling the urge to go.  Activity level:  Nonweightbearing left leg Diet tolerance:  ok Voiding:  ok Patient reports pain as mild.    Objective: Vital signs in last 24 hours: Temp:  [98.2 F (36.8 C)-98.4 F (36.9 C)] 98.4 F (36.9 C) (04/17 0614) Pulse Rate:  [75-95] 95 (04/17 0614) Resp:  [17-18] 17 (04/17 0614) BP: (110-132)/(46-57) 132/57 mmHg (04/17 0614) SpO2:  [95 %-97 %] 95 % (04/17 0614)  Labs:  Recent Labs  09/26/14 0507  HGB 11.0*    Recent Labs  09/26/14 0507  WBC 9.7  RBC 3.96  HCT 34.7*  PLT 285    Recent Labs  09/26/14 0507 09/27/14 0640  NA 139 138  K 4.0 4.0  CL 101 99  CO2 25 28  BUN 11 7  CREATININE 0.87 0.76  GLUCOSE 141* 112*  CALCIUM 9.1 8.6   No results for input(s): LABPT, INR in the last 72 hours.  Physical Exam:  Neurologically intact ABD soft Neurovascular intact Sensation intact distally Intact pulses distally Incision: dressing C/D/I and no drainage No cellulitis present Compartment soft  Assessment/Plan:  2 Days Post-Op Procedure(s) (LRB): OPEN REDUCTION INTERNAL FIXATION (ORIF) ANKLE FRACTURE (Left) Advance diet Up with therapy  Drain removed from medial aspect of ankle. Splint was rewrapped. Continue current pain meds. She will try to cut back as they mauy be making her a little too sedated and she is wetting herself. She feels much better on the percocet and is no longer itching from the hydrocodone. Continue IV abx for 48hrs post op. She will be d/c home on oral keflex. Follow up in office 7-10 days.   Debra Short, Larwance Sachs 09/28/2014, 8:53 AM

## 2014-09-28 NOTE — Progress Notes (Signed)
Physical Therapy Treatment Patient Details Name: Debra Short MRN: [id number] DOB: November 21, 1940 Today's Date: 09/28/2014    History of Present Illness 74 yo female post mechanical fall and bi-malleolar ankle fx, post ORIF 09/26/14.      PT Comments    Making progress with mobility; For dc, it is not unreasonable that she will progress well enough to be able to dc home -- that may be at a wheelchair level; Still, I favor dc to SNF where she can have consistent threapies to maximize independence and safety with mobility prior to dc home  Follow Up Recommendations  Other (comment) (Will watch progress closely to help discern SNF versus home at wheelchair level)     Equipment Recommendations  Rolling walker with 5" wheels;Wheelchair (measurements PT);Wheelchair cushion (measurements PT)    Recommendations for Other Services OT consult     Precautions / Restrictions Precautions Precautions: Fall Restrictions LLE Weight Bearing: Non weight bearing    Mobility  Bed Mobility Overal bed mobility: Needs Assistance Bed Mobility: Supine to Sit     Supine to sit: Supervision     General bed mobility comments: slow, use of bed rails  Transfers Overall transfer level: Needs assistance Equipment used: Rolling walker (2 wheeled) Transfers: Sit to/from Stand Sit to Stand: Min guard         General transfer comment: Cues for WB and hand positioning  Ambulation/Gait Ambulation/Gait assistance: Min guard Ambulation Distance (Feet): 25 Feet Assistive device: Rolling walker (2 wheeled) Gait Pattern/deviations:  (Hop-to)     General Gait Details: Verbal and demo cues for more pressing bodyweight into RW to advance RLE rather than hopping, which canbe extremely taxing; she had difficulty doing this, but kept NWB well; very taxed by the end of walk   Stairs            Wheelchair Mobility    Modified Rankin (Stroke Patients Only)       Balance     Sitting  balance-Leahy Scale: Fair       Standing balance-Leahy Scale: Poor                      Cognition Arousal/Alertness: Awake/alert Behavior During Therapy: WFL for tasks assessed/performed Overall Cognitive Status: Within Functional Limits for tasks assessed                      Exercises General Exercises - Lower Extremity Straight Leg Raises: AROM;Left;10 reps    General Comments        Pertinent Vitals/Pain Pain Assessment: 0-10 Pain Score: 3  Pain Location: L foot and ankle Pain Descriptors / Indicators: Aching Pain Intervention(s): Premedicated before session;Repositioned    Home Living                      Prior Function            PT Goals (current goals can now be found in the care plan section) Acute Rehab PT Goals Patient Stated Goal: Not specifically stated, to be independent again. PT Goal Formulation: With patient Time For Goal Achievement: 10/11/14 Potential to Achieve Goals: Good Progress towards PT goals: Progressing toward goals    Frequency  Min 5X/week    PT Plan Discharge plan needs to be updated    Co-evaluation             End of Session Equipment Utilized During Treatment: Gait belt Activity Tolerance: Patient tolerated treatment well;Patient limited  by fatigue Patient left: in chair;with call bell/phone within reach;with family/visitor present     Time: 4287-6811 PT Time Calculation (min) (ACUTE ONLY): 26 min  Charges:  $Gait Training: 8-22 mins $Therapeutic Activity: 8-22 mins                    G Codes:      Quin Hoop 09/28/2014, 4:19 PM  Roney Marion, [place] Pager [phone] Office [phone]

## 2014-09-29 ENCOUNTER — Encounter (HOSPITAL_COMMUNITY): Payer: Self-pay | Admitting: Orthopedic Surgery

## 2014-09-29 LAB — GLUCOSE, CAPILLARY
Glucose-Capillary: 95 mg/dL (ref 70–99)
Glucose-Capillary: 132 mg/dL — ABNORMAL HIGH (ref 70–99)
Glucose-Capillary: 106 mg/dL — ABNORMAL HIGH (ref 70–99)
Glucose-Capillary: 98 mg/dL (ref 70–99)

## 2014-09-29 LAB — URINALYSIS, ROUTINE W REFLEX MICROSCOPIC
Bilirubin Urine: NEGATIVE
Glucose, UA: NEGATIVE mg/dL
Ketones, ur: >80 mg/dL — AB
Leukocytes, UA: NEGATIVE
Nitrite: NEGATIVE
Protein, ur: NEGATIVE mg/dL
Specific Gravity, Urine: 1.012 (ref 1.005–1.030)
Urobilinogen, UA: 0.2 mg/dL (ref 0.0–1.0)
pH: 7.5 (ref 5.0–8.0)

## 2014-09-29 LAB — URINE MICROSCOPIC-ADD ON

## 2014-09-29 NOTE — Progress Notes (Signed)
Physical Therapy Treatment Patient Details Name: Debra Short MRN: [id number] DOB: [birth year] Today's Date: 09/29/2014    History of Present Illness 74 yo female post mechanical fall and bi-malleolar ankle fx, post ORIF 09/26/14.      PT Comments    Pt is making progress towards goals and increasing functional independence. Pt has 43 year old neighbor who helps with dog at home, but not able to provide physical assistance. Continue to recommend SNF for further PT to increase functional independence.    Follow Up Recommendations  SNF     Equipment Recommendations  Rolling walker with 5" wheels;Wheelchair (measurements PT);Wheelchair cushion (measurements PT)    Recommendations for Other Services OT consult     Precautions / Restrictions Precautions Precautions: Fall Restrictions Weight Bearing Restrictions: Yes LLE Weight Bearing: Non weight bearing    Mobility  Bed Mobility               General bed mobility comments: Pt in chair before and after.  Transfers Overall transfer level: Needs assistance Equipment used: Rolling walker (2 wheeled) Transfers: Sit to/from Stand Sit to Stand: Min guard         General transfer comment: Min guard for safety.  Ambulation/Gait Ambulation/Gait assistance: Min guard Ambulation Distance (Feet): 30 Feet Assistive device: Rolling walker (2 wheeled)     Gait velocity interpretation: Below normal speed for age/gender General Gait Details: Pt able to ambulate with hop-to pattern and maintain NWB status. C/o lightheadedness during ambulation.   Stairs            Wheelchair Mobility    Modified Rankin (Stroke Patients Only)       Balance                                    Cognition Arousal/Alertness: Awake/alert Behavior During Therapy: WFL for tasks assessed/performed Overall Cognitive Status: Within Functional Limits for tasks assessed                      Exercises General  Exercises - Lower Extremity Quad Sets: AROM;Left;5 reps Long Arc Quad: AROM;Left;10 reps Straight Leg Raises: AAROM;Left;10 reps    General Comments        Pertinent Vitals/Pain Pain Assessment: No/denies pain Pain Intervention(s): Monitored during session;RN gave pain meds during session    Home Living                      Prior Function            PT Goals (current goals can now be found in the care plan section) Progress towards PT goals: Progressing toward goals    Frequency  Min 5X/week    PT Plan Current plan remains appropriate    Co-evaluation             End of Session Equipment Utilized During Treatment: Gait belt Activity Tolerance: Patient tolerated treatment well;Patient limited by fatigue Patient left: in chair;with call bell/phone within reach     Time: 1016-1036 PT Time Calculation (min) (ACUTE ONLY): 20 min  Charges:                       G CodesRubye Oaks, SPTA 09/29/2014, 10:49 AM

## 2014-09-29 NOTE — Clinical Social Work Placement (Signed)
Clinical Social Work Department CLINICAL SOCIAL WORK PLACEMENT NOTE 09/29/2014  Patient:  Debra Short, Debra Short  Account Number:  [id number] [place] date:  09/26/2014  Clinical Social Worker:  Wylene Men  Date/time:  09/29/2014 12:36 PM  Clinical Social Work is seeking post-discharge placement for this patient at the following level of care:   SKILLED NURSING   (*CSW will update this form in Epic as items are completed)   09/29/2014  Patient/family provided with [place] Department of Clinical Social Work's list of facilities offering this level of care within the geographic area requested by the patient (or if unable, by the patient's family).  09/29/2014  Patient/family informed of their freedom to choose among providers that offer the needed level of care, that participate in Medicare, Medicaid or managed care program needed by the patient, have an available bed and are willing to accept the patient.  09/29/2014  Patient/family informed of [facility]' ownership interest in [facility], as well as of the fact that they are under no obligation to receive care at this facility.  PASARR submitted to EDS on 09/29/2014 PASARR number received on 09/29/2014  FL2 transmitted to all facilities in geographic area requested by pt/family on  09/29/2014 FL2 transmitted to all facilities within larger geographic area on 09/29/2014  Patient informed that his/her managed care company has contracts with or will negotiate with  certain facilities, including the following:     Patient/family informed of bed offers received:  09/29/2014 Patient chooses bed at [facility] Physician recommends and patient chooses bed at    Patient to be transferred to [facility] on  09/29/2014 Patient to be transferred to facility by [facility] Patient and family notified of transfer on 09/29/2014 Name of family member notified:  patient is alert and oriented x4  The following physician request were  entered in Epic:   Additional Comments:   Nonnie Done, [place] [phone]  Psychiatric & Orthopedics (5N 1-8) Clinical Social Worker

## 2014-09-29 NOTE — Clinical Social Work Psychosocial (Signed)
Clinical Social Work Department BRIEF PSYCHOSOCIAL ASSESSMENT 09/29/2014  Patient:  Debra Short, Debra Short     Account Number:  [id number]     Admit date:  09/26/2014  Clinical Social Worker:  Wylene Men  Date/Time:  09/29/2014 12:32 PM  Referred by:  Physician  Date Referred:  09/29/2014 Referred for  Psychosocial assessment  SNF Placement   Other Referral:   none   Interview type:  Patient Other interview type:   Patient is alert and oriented x4    PSYCHOSOCIAL DATA Living Status:  ALONE Admitted from facility:   Level of care:   Primary support name:  Debra Short Primary support relationship to patient:  CHILD, ADULT Degree of support available:   adequate    CURRENT CONCERNS Current Concerns  Post-Acute Placement   Other Concerns:   none    SOCIAL WORK ASSESSMENT / PLAN CSW assessed patient for possible SNF placement at time of discharge.  Patient was alert and oriented x4 during the course of this assessment. Patient states that her main support is her son, Debra Short who works in [place] and her siblings who live in [place].  PT is recommending STR at SNF once patient is medically stable for discharge. Patient is aware and agreeable.  Patient states no familiarity with SNFs, though she feels that she has heard great things about [facility].  Patient requesting CSW to assist in securing a room for STR at [facility].  Patient anxious regarding STR and the unknown, however agreeable that home is not a safe discharge at this time.   Assessment/plan status:  Psychosocial Support/Ongoing Assessment of Needs Other assessment/ plan:   FL2  PASARR   Information/referral to community resources:   SNF/STR    PATIENT'S/FAMILY'S RESPONSE TO PLAN OF CARE: Patient is agreeable to SNF search of [facility]. First choice, [facility].       Nonnie Done, [place] [phone]  Psychiatric & Orthopedics (5N 1-8) Clinical Social Worker

## 2014-09-29 NOTE — Clinical Social Work Note (Signed)
CSW received notification from SNF liaison that patient did not wish to sign admission's paperwork today.  Patient felt she could possibly go home.  Patient states she needs to care for her dog and that the folks she had lined up to take care of said dog was unable to assist now  [facility] consulted.  Patient advised liaison that medical staff advised her that she was not discharging today.  CSW unaware of change.  RN contacted/notified.  SNF on standby for possible admission tomorrow pending patient's inability to return home.  Nonnie Done, [place] [phone]  Psychiatric & Orthopedics (5N 1-8) Clinical Social Worker

## 2014-09-29 NOTE — Progress Notes (Signed)
Inpatient Rehabilitation  We received a screen request for possible rehab admission.  In reviewing the chart  I note that PT is now recommending SNF vs home at w/c level.  Additionally, this patient does not appear to have the medical complexity to justify an IP Rehab admission.  At this time, we are not recommending a rehab consult.  Will sign off.  Suggest consideration of SNF vs home depending on patient progress.  Please call if questions.  [place] Admissions Coordinator Cell [phone] Office [phone]

## 2014-09-29 NOTE — Progress Notes (Signed)
RN notified Gaspar Skeeters regarding patients discharge plan. PT recommending SNF. SW made aware and will attempt to place patient at a facility today if possible. UA resulted negative and PA aware. Will continue to work on discharge.

## 2014-09-29 NOTE — Care Management Note (Signed)
CARE MANAGEMENT NOTE 09/29/2014  Patient:  Debra Short, Debra Short   Account Number:  [id number]  Date Initiated:  09/29/2014  Documentation initiated by:  Ricki Miller  Subjective/Objective Assessment:   74 yr old female admitted with a left ankle fracture,s/p fall. Patient underwent a left ankle ORIF.     Action/Plan:   Patient states she is trying to decide if she can go home versus going to SNF. Case manager will continue to monitor.  SNF reccommended by therapy.   Anticipated DC Date:     Anticipated DC Plan:           Choice offered to / List presented to:             Status of service:  In process, will continue to follow Medicare Important Message given?  YES (If response is "NO", the following Medicare IM given date fields will be blank) Date Medicare IM given:  09/29/2014 Medicare IM given by:  Ricki Miller Date Additional Medicare IM given:   Additional Medicare IM given by:    Discharge Disposition:    Per UR Regulation:  Reviewed for med. necessity/level of care/duration of stay

## 2014-09-29 NOTE — Progress Notes (Signed)
Subjective: 3 Days Post-Op Procedure(s) (LRB): OPEN REDUCTION INTERNAL FIXATION (ORIF) ANKLE FRACTURE (Left) Patient reports pain as mild.  Complains of urinary frequency.  No dysuria.  Objective: Vital signs in last 24 hours: Temp:  [97.8 F (36.6 C)-98.9 F (37.2 C)] 97.8 F (36.6 C) (04/18 0641) Pulse Rate:  [74-89] 89 (04/18 0641) Resp:  [18] 18 (04/18 0641) BP: (96-154)/(35-59) 154/56 mmHg (04/18 0641) SpO2:  [90 %-98 %] 90 % (04/18 0641)  Intake/Output from previous day: 04/17 0701 - 04/18 0700 In: 530 [P.O.:480; IV Piggyback:50] Out: -  Intake/Output this shift:    No results for input(s): HGB in the last 72 hours. No results for input(s): WBC, RBC, HCT, PLT in the last 72 hours.  Recent Labs  09/27/14 0640  NA 138  K 4.0  CL 99  CO2 28  BUN 7  CREATININE 0.76  GLUCOSE 112*  CALCIUM 8.6  left leg exam: Posterior splint intact.  It is clean and dry.  Moves toes actively.  Good sensation  to toes.   Assessment/Plan: 3 Days Post-Op Procedure(s) (LRB): OPEN REDUCTION INTERNAL FIXATION (ORIF) ANKLE FRACTURE (Left)  Urinary frequency. Plan: Will get a urinalysis.  If negative Be either discharged home or to skilled nursing facility depending upon how she does with physical therapy. Up with physical therapy nonweightbearing on left. Aspirin 1 daily for DVT prophylaxis.   [place] G 09/29/2014, 9:14 AM

## 2014-09-30 ENCOUNTER — Inpatient Hospital Stay (HOSPITAL_COMMUNITY)
Admit: 2014-09-30 | Discharge: 2014-10-01 | Disposition: A | Discharge status: Skilled Nursing Facility | Payer: Medicare Other | Source: Ambulatory Visit | Attending: Orthopedic Surgery | Admitting: Orthopedic Surgery

## 2014-09-30 DIAGNOSIS — S82892B Other fracture of left lower leg, initial encounter for open fracture type I or II: Secondary | ICD-10-CM | POA: Diagnosis present

## 2014-09-30 LAB — GLUCOSE, CAPILLARY
Glucose-Capillary: 89 mg/dL (ref 70–99)
Glucose-Capillary: 114 mg/dL — ABNORMAL HIGH (ref 70–99)
Glucose-Capillary: 101 mg/dL — ABNORMAL HIGH (ref 70–99)

## 2014-09-30 MED ORDER — OXYCODONE-ACETAMINOPHEN 5-325 MG PO TABS
1.0000 | ORAL_TABLET | ORAL | Status: DC | PRN
  Administered 2014-10-01 (×2): 2 via ORAL
  Filled 2014-09-30 (×2): qty 2

## 2014-09-30 MED ORDER — PANTOPRAZOLE SODIUM 40 MG PO TBEC
40.0000 mg | DELAYED_RELEASE_TABLET | Freq: Every day | ORAL | Status: DC
  Administered 2014-10-01: 40 mg via ORAL
  Filled 2014-09-30: qty 1

## 2014-09-30 MED ORDER — ALPRAZOLAM 0.5 MG PO TABS
1.0000 mg | ORAL_TABLET | Freq: Two times a day (BID) | ORAL | Status: DC | PRN
  Administered 2014-10-01: 1 mg via ORAL
  Filled 2014-09-30: qty 2

## 2014-09-30 MED ORDER — OXYCODONE-ACETAMINOPHEN 5-325 MG PO TABS: 1.0000 | ORAL_TABLET | ORAL | Status: DC | PRN

## 2014-09-30 MED ORDER — ZOLPIDEM TARTRATE 5 MG PO TABS
5.0000 mg | ORAL_TABLET | Freq: Every day | ORAL | Status: DC
  Administered 2014-10-01: 5 mg via ORAL
  Filled 2014-09-30: qty 1

## 2014-09-30 MED ORDER — ASPIRIN 81 MG PO CHEW
81.0000 mg | CHEWABLE_TABLET | Freq: Every day | ORAL | Status: DC
  Administered 2014-10-01: 81 mg via ORAL
  Filled 2014-09-30: qty 1

## 2014-09-30 MED ORDER — FLUOXETINE HCL 20 MG PO CAPS
40.0000 mg | ORAL_CAPSULE | Freq: Every day | ORAL | Status: DC
  Administered 2014-10-01: 40 mg via ORAL
  Filled 2014-09-30: qty 2

## 2014-09-30 MED ORDER — MONTELUKAST SODIUM 10 MG PO TABS
10.0000 mg | ORAL_TABLET | Freq: Every day | ORAL | Status: DC
  Administered 2014-10-01: 10 mg via ORAL
  Filled 2014-09-30: qty 1

## 2014-09-30 MED ORDER — ATORVASTATIN CALCIUM 80 MG PO TABS: 80.0000 mg | ORAL_TABLET | Freq: Every day | ORAL | Status: DC

## 2014-09-30 MED ORDER — BISOPROLOL-HYDROCHLOROTHIAZIDE 5-6.25 MG PO TABS
1.0000 | ORAL_TABLET | Freq: Every day | ORAL | Status: DC
  Administered 2014-10-01: 1 via ORAL
  Filled 2014-09-30: qty 1

## 2014-09-30 MED ORDER — CELECOXIB 200 MG PO CAPS: 200.0000 mg | ORAL_CAPSULE | Freq: Every day | ORAL | Status: DC | PRN

## 2014-09-30 MED ORDER — METFORMIN HCL 500 MG PO TABS
500.0000 mg | ORAL_TABLET | Freq: Every day | ORAL | Status: DC
  Administered 2014-10-01: 500 mg via ORAL
  Filled 2014-09-30: qty 1

## 2014-09-30 MED ORDER — GABAPENTIN 300 MG PO CAPS: 300.0000 mg | ORAL_CAPSULE | Freq: Two times a day (BID) | ORAL | Status: DC | PRN

## 2014-09-30 NOTE — Progress Notes (Signed)
Physical Therapy Treatment Patient Details Name: Debra Short MRN: [id number] DOB: December 23, 1940 Today's Date: 09/30/2014    History of Present Illness 74 yo female post mechanical fall and bi-malleolar ankle fx, post ORIF 09/26/14.      PT Comments    Pt is making progress towards goals, but is still limited with mobility. Continue to recommend SNF for further PT to increase functional independence, but patient insistent on going home to care for dog. Pt stated that she is trying to arrange for private help service at home, and her son will be able to provide support in the evening. Pt plans to D/C later today.  Follow Up Recommendations  SNF     Equipment Recommendations  Rolling walker with 5" wheels;Wheelchair (measurements PT);Wheelchair cushion (measurements PT)    Recommendations for Other Services OT consult     Precautions / Restrictions Precautions Precautions: Fall Restrictions Weight Bearing Restrictions: Yes LLE Weight Bearing: Non weight bearing    Mobility  Bed Mobility               General bed mobility comments: Pt in chair before and after.  Transfers Overall transfer level: Needs assistance Equipment used: Rolling walker (2 wheeled) Transfers: Sit to/from Stand Sit to Stand: Min guard         General transfer comment: Min guard for safety. Cues for hand placement.  Ambulation/Gait Ambulation/Gait assistance: Min guard Ambulation Distance (Feet): 20 Feet Assistive device: Rolling walker (2 wheeled) Gait Pattern/deviations: Step-to pattern   Gait velocity interpretation: Below normal speed for age/gender General Gait Details: Pt able to ambulate with hop-to pattern and maintain NWB status to bathroom and back to chair.   Stairs            Wheelchair Mobility    Modified Rankin (Stroke Patients Only)       Balance                                    Cognition Arousal/Alertness: Awake/alert Behavior During  Therapy: WFL for tasks assessed/performed Overall Cognitive Status: Within Functional Limits for tasks assessed                      Exercises General Exercises - Lower Extremity Quad Sets: AROM;Both;10 reps Long Arc Quad: AROM;Left;10 reps Straight Leg Raises: AAROM;Left;10 reps    General Comments        Pertinent Vitals/Pain Pain Assessment: No/denies pain Pain Intervention(s): Monitored during session    Home Living                      Prior Function            PT Goals (current goals can now be found in the care plan section) Progress towards PT goals: Progressing toward goals    Frequency  Min 5X/week    PT Plan Current plan remains appropriate    Co-evaluation             End of Session   Activity Tolerance: Patient tolerated treatment well Patient left: in chair;with call bell/phone within reach     Time: 1751-0258 PT Time Calculation (min) (ACUTE ONLY): 30 min  Charges:  $Gait Training: 8-22 mins $Therapeutic Exercise: 8-22 mins                    G Codes:      [place], [place],  SPTA 09/30/2014, 12:52 PM

## 2014-09-30 NOTE — Progress Notes (Signed)
Subjective: 4 Days Post-Op Procedure(s) (LRB): OPEN REDUCTION INTERNAL FIXATION (ORIF) ANKLE FRACTURE (Left) Patient reports pain as mild.  The patient reports that she is unsure what she wants to do. She reports that she wants to "stay in the hospital ". She is drinking and voiding okay.  Objective: Vital signs in last 24 hours: Temp:  [97.9 F (36.6 C)-98.1 F (36.7 C)] 97.9 F (36.6 C) (04/19 0539) Pulse Rate:  [78-85] 81 (04/19 0539) Resp:  [16-18] 16 (04/19 0539) BP: (115-130)/(44-66) 130/66 mmHg (04/19 0539) SpO2:  [96 %-99 %] 96 % (04/19 0539)  Intake/Output from previous day: 04/18 0701 - 04/19 0700 In: 480 [P.O.:480] Out: -  Intake/Output this shift: Total I/O In: 100 [P.O.:100] Out: -   No results for input(s): HGB in the last 72 hours. No results for input(s): WBC, RBC, HCT, PLT in the last 72 hours. No results for input(s): NA, K, CL, CO2, BUN, CREATININE, GLUCOSE, CALCIUM in the last 72 hours. No results for input(s): LABPT, INR in the last 72 hours. Urinalysis yesterday was negative. Left ankle exam: Posterior splint is intact. It is clean and dry. She moves her toes actively.   Assessment/Plan: 4 Days Post-Op Procedure(s) (LRB): OPEN REDUCTION INTERNAL FIXATION (ORIF) ANKLE FRACTURE (Left) Plan: Continue to work with physical therapy. She either needs a skilled nursing facility today or home with home health physical therapy today. We will see how she does with physical therapy and make that decision. The FL 2 has been signed. She will be nonweightbearing on the left lower extremity. She will need a follow-up with Dr. [name] in 2 weeks.   Angeliah Wisdom G 09/30/2014, 9:53 AM

## 2014-09-30 NOTE — Care Management Note (Signed)
CARE MANAGEMENT NOTE 09/30/2014  Patient:  Debra Short, Debra Short   Account Number:  [id number]  Date Initiated:  09/29/2014  Documentation initiated by:  Ricki Miller  Subjective/Objective Assessment:   74 yr old female admitted with a left ankle fracture,s/p fall. Patient underwent a left ankle ORIF.     Action/Plan:   Patient states she is trying to decide if she can go home versus going to SNF. Case manager will continue to monitor.   Anticipated DC Date:  09/30/2014   Anticipated DC Plan:  [place]  CM consult      [facility] Choice  HOME HEALTH  DURABLE MEDICAL EQUIPMENT   Choice offered to / List presented to:  C-1 Patient   DME arranged  [place]      DME agency  [place] arranged  HH-2 PT  HH-4 NURSE'S AIDE      Status of service:  Completed, signed off Medicare Important Message given?  YES (If response is "NO", the following Medicare IM given date fields will be blank) Date Medicare IM given:  09/29/2014 Medicare IM given by:  Ricki Miller Date Additional Medicare IM given:   Additional Medicare IM given by:    Discharge Disposition:    Per UR Regulation:  Reviewed for med. necessity/level of care/duration of stay  If discussed at [place] of Stay Meetings, dates discussed:    Comments:  09/30/14 [place], RN BSN CASE MANAGER Patient states that she will go home.She will need ambulance transport,  son will arrive at her home later after he gets off work. CM discussed private duty care since she will be home alone daily, provided private duty list. Social worker and case manager spoke with patient together.

## 2014-09-30 NOTE — Progress Notes (Signed)
ED CM received calll from CM Ricki Miller concerning patient who was discharged from [facility] today. ED CM spoke with son J. Lassiter [phone] reports patient was transported home by [facility], and when she arrived stated that she did not have the equipment needed and requested to have [facility] take patient back to [facility] ED.  Made son aware that ED CM will provide rolling walker and 3 n 1 chair. Son stated, he offered to have patient stay with him tonight but she refused.  CM awaiting patient to return.

## 2014-09-30 NOTE — Clinical Social Work Note (Signed)
CSW and RNCM met with patient at bedside, patient refusing SNF placement and requesting to be discharged home. Patient requesting EMS ([facility]) transportation at time of discharge. CSW confirmed patient's address and completed necessary EMS ([facility]) transportation paperwork and placed on patient's chart.  RN to call for EMS [facility]) once patient's son is able to meet patient at home this evening.  EMS ([facility]): [phone] (before 5pm) / [phone] (after 5pm)  CSW signing off.  Lubertha Sayres, [place] Cell: [phone]       Fax: [phone] Clinical Social Work: Orthopedics [phone]) and Surgical [phone])

## 2014-10-01 ENCOUNTER — Encounter (HOSPITAL_COMMUNITY): Payer: Self-pay | Admitting: General Practice

## 2014-10-01 DIAGNOSIS — D62 Acute posthemorrhagic anemia: Secondary | ICD-10-CM | POA: Diagnosis not present

## 2014-10-01 DIAGNOSIS — E785 Hyperlipidemia, unspecified: Secondary | ICD-10-CM | POA: Diagnosis not present

## 2014-10-01 DIAGNOSIS — Z9181 History of falling: Secondary | ICD-10-CM | POA: Diagnosis not present

## 2014-10-01 DIAGNOSIS — F418 Other specified anxiety disorders: Secondary | ICD-10-CM | POA: Diagnosis not present

## 2014-10-01 DIAGNOSIS — I10 Essential (primary) hypertension: Secondary | ICD-10-CM | POA: Diagnosis not present

## 2014-10-01 DIAGNOSIS — K219 Gastro-esophageal reflux disease without esophagitis: Secondary | ICD-10-CM | POA: Diagnosis not present

## 2014-10-01 DIAGNOSIS — M1990 Unspecified osteoarthritis, unspecified site: Secondary | ICD-10-CM | POA: Diagnosis not present

## 2014-10-01 DIAGNOSIS — R278 Other lack of coordination: Secondary | ICD-10-CM | POA: Diagnosis not present

## 2014-10-01 DIAGNOSIS — Z9889 Other specified postprocedural states: Secondary | ICD-10-CM | POA: Diagnosis not present

## 2014-10-01 DIAGNOSIS — M6281 Muscle weakness (generalized): Secondary | ICD-10-CM | POA: Diagnosis not present

## 2014-10-01 DIAGNOSIS — M21272 Flexion deformity, left ankle and toes: Secondary | ICD-10-CM | POA: Diagnosis not present

## 2014-10-01 DIAGNOSIS — Z4789 Encounter for other orthopedic aftercare: Secondary | ICD-10-CM | POA: Diagnosis not present

## 2014-10-01 DIAGNOSIS — R2681 Unsteadiness on feet: Secondary | ICD-10-CM | POA: Diagnosis not present

## 2014-10-01 DIAGNOSIS — K5901 Slow transit constipation: Secondary | ICD-10-CM | POA: Diagnosis not present

## 2014-10-01 DIAGNOSIS — S82842E Displaced bimalleolar fracture of left lower leg, subsequent encounter for open fracture type I or II with routine healing: Secondary | ICD-10-CM | POA: Diagnosis not present

## 2014-10-01 DIAGNOSIS — S82892S Other fracture of left lower leg, sequela: Secondary | ICD-10-CM | POA: Diagnosis not present

## 2014-10-01 DIAGNOSIS — S82842S Displaced bimalleolar fracture of left lower leg, sequela: Secondary | ICD-10-CM | POA: Diagnosis not present

## 2014-10-01 DIAGNOSIS — G4700 Insomnia, unspecified: Secondary | ICD-10-CM | POA: Diagnosis not present

## 2014-10-01 DIAGNOSIS — E119 Type 2 diabetes mellitus without complications: Secondary | ICD-10-CM | POA: Diagnosis not present

## 2014-10-01 LAB — GLUCOSE, CAPILLARY
Glucose-Capillary: 103 mg/dL — ABNORMAL HIGH (ref 70–99)
Glucose-Capillary: 125 mg/dL — ABNORMAL HIGH (ref 70–99)
Glucose-Capillary: 88 mg/dL (ref 70–99)
Glucose-Capillary: 96 mg/dL (ref 70–99)

## 2014-10-01 NOTE — Discharge Summary (Signed)
Patient ID: Debra Short MRN: [id number] DOB/AGE: 10-18-1940 74 y.o.  Admit date: 09/26/2014 Discharge date: 10/01/2014  Admission Diagnoses:  Principal Problem:   Open bimalleolar fracture of left ankle Active Problems:   Hyperlipidemia   Hypertension   Arthritis   GERD (gastroesophageal reflux disease)   Vitamin D deficiency   Diabetes mellitus type 2, controlled   Open fracture and dislocation of left ankle   Ankle fracture   Left ankle pain   Open left ankle fracture   Discharge Diagnoses:  Same  Past Medical History  Diagnosis Date  . Hyperlipidemia   . Hypertension   . Arthritis   . GERD (gastroesophageal reflux disease)   . Vitamin D deficiency   . IBS (irritable bowel syndrome)   . Prediabetes   . Large cell lymphoma   . Diabetes mellitus without complication   . Depression   . Anxiety   . Headache   . Fibromyalgia     Surgeries: Procedure(s): OPEN REDUCTION INTERNAL FIXATION (ORIF) ANKLE FRACTURE left on 09/26/2014    Discharged Condition: Improved  Hospital Course: Debra Short is an 74 y.o. female who was admitted 09/26/2014 for operative treatment ofOpen bimalleolar fracture of left ankle. The patient had a fall and had a severely  Open fracture of the medial malleolus.this was reduced by Dr. [name] in the emergency department and the patient was brought quickly to the operating room for I and D of the left ankle with ORIF. She will be admitted postoperatively for pain control and IV antibiotics.Patient has severe unremitting pain that affects sleep, daily activities, and work/hobbies. After pre-op clearance the patient was taken to the operating room on 09/26/2014 and underwent  Procedure(s): OPEN REDUCTION INTERNAL FIXATION (ORIF) ANKLE FRACTURE Left   Patient was given perioperative antibiotics: Anti-infectives    Start     Dose/Rate Route Frequency Ordered Stop   09/26/14 1800  ceFAZolin (ANCEF) IVPB 2 g/50 mL premix     2 g 100 mL/hr over 30  Minutes Intravenous Every 8 hours 09/26/14 1321 09/28/14 1035   09/26/14 0800  ceFAZolin (ANCEF) IVPB 2 g/50 mL premix     2 g 100 mL/hr over 30 Minutes Intravenous On call to O.R. 09/26/14 0753 09/26/14 1000   09/26/14 0515  ceFAZolin (ANCEF) IVPB 1 g/50 mL premix     1 g 100 mL/hr over 30 Minutes Intravenous  Once 09/26/14 0501 09/26/14 0603       Patient was given sequential compression devices, early ambulation, and chemoprophylaxis to prevent DVT.physical therapy was ordered. It was felt that she was slow with physical therapy. The patient lived alone. Physical therapy recommended skilled nursing facility. Initially the patient desired to go home, but did have no one to help her. She was transported to her home by ambulance but did not even get out of the ambulance and demanded that she be brought back to the hospital. This occurred yesterday. Her home supplies had not been delivered including a walker and wheelchair. She is felt based upon her medical issues and difficulty with ambulation in physical therapy that she was a candidate for skilled nursing facility.  Patient benefited maximally from hospital stay and there were no complications.    Recent vital signs: Patient Vitals for the past 24 hrs:  BP Temp Pulse Resp SpO2  09/30/14 1257 (!) 150/62 mmHg 98.8 F (37.1 C) 75 16 95 %     Recent laboratory studies: No results for input(s): WBC, HGB, HCT, PLT, NA, K,  CL, CO2, BUN, CREATININE, GLUCOSE, INR, CALCIUM in the last 72 hours.  Invalid input(s): PT, 2   Discharge Medications:     Medication List    STOP taking these medications        HYDROcodone-acetaminophen 5-325 MG per tablet  Commonly known as:  NORCO/VICODIN  Replaced by:  HYDROcodone-acetaminophen 10-325 MG per tablet     SYSTANE OP      TAKE these medications        ALPRAZolam 1 MG tablet  Commonly known as:  XANAX  Take 1 mg by mouth 2 (two) times daily as needed for anxiety.     aspirin 81 MG chewable  tablet  Chew by mouth daily.     atorvastatin 80 MG tablet  Commonly known as:  LIPITOR  TAKE 1 TABLET BY MOUTH DAILY FOR CHLOESTEROL. DISCONTINUE PRAVASTATIN     bisoprolol-hydrochlorothiazide 5-6.25 MG per tablet  Commonly known as:  ZIAC  TAKE 1 TABLET BY MOUTH DAILY     celecoxib 200 MG capsule  Commonly known as:  CELEBREX  Take 200 mg by mouth daily as needed for mild pain.     cephALEXin 500 MG capsule  Commonly known as:  KEFLEX  Take 1 capsule (500 mg total) by mouth 3 (three) times daily.     esomeprazole 20 MG capsule  Commonly known as:  NEXIUM  Take 20 mg by mouth daily as needed (acid reflux).     FLUoxetine 40 MG capsule  Commonly known as:  PROZAC  TAKE 1 CAPSULE EVERY DAY FOR MOOD     gabapentin 300 MG capsule  Commonly known as:  NEURONTIN  Take 300 mg by mouth 2 (two) times daily as needed (pain).     HYDROcodone-acetaminophen 10-325 MG per tablet  Commonly known as:  NORCO  Take 1 tablet by mouth every 6 (six) hours as needed for severe pain.     metFORMIN 500 MG 24 hr tablet  Commonly known as:  GLUCOPHAGE XR  Take 1 tablet (500 mg total) by mouth daily with breakfast.     montelukast 10 MG tablet  Commonly known as:  SINGULAIR  TAKE 1 TABLET EVERY DAY     rizatriptan 10 MG tablet  Commonly known as:  MAXALT  Take 10 mg by mouth daily as needed for migraine.     tiZANidine 4 MG tablet  Commonly known as:  ZANAFLEX  Take 1/2 to 1 tablet tid.     triamcinolone 55 MCG/ACT Aero nasal inhaler  Commonly known as:  NASACORT  Place 2 sprays into the nose daily as needed (for seasonal allergies).     VITAMIN D PO  Take 5,000-10,000 Units by mouth daily. Takes 5,000 units daily, except takes 10,000 units on Mondays, Wednesdays, and Fridays.     zolpidem 10 MG tablet  Commonly known as:  AMBIEN  Take 5 mg by mouth at bedtime.        Diagnostic Studies: Dg Ankle Complete Left  10/11/14   CLINICAL DATA:  ORIF of left ankle fracture  EXAM: DG  C-ARM 61-120 MIN; LEFT ANKLE COMPLETE - 3+ VIEW  COMPARISON:  None.  FLUOROSCOPY TIME:  Radiation Exposure Index (as provided by the fluoroscopic device): Not available  If the device does not provide the exposure index:  Fluoroscopy Time:  25 seconds  Number of Acquired Images:  For  FINDINGS: Fixation sideplate is now noted along the distal fibula with multiple fixation screws. Two fixation screws are noted along the  medial malleolus. The fracture dislocation has been reduced.  IMPRESSION: ORIF of left distal tibia and fibular fractures   Electronically Signed   By: Inez Catalina M.D.   On: 09/26/2014 12:33   Dg Chest Port 1 View  09/26/2014   CLINICAL DATA:  Hypoxia. Preop respiratory exam left ankle fracture dislocation.  EXAM: PORTABLE CHEST - 1 VIEW  COMPARISON:  06/17/2008  FINDINGS: Mild cardiomegaly is noted. Low lung volumes. No evidence of pulmonary infiltrate or edema. No evidence of pleural effusion.  IMPRESSION: Mild cardiomegaly and low lung volumes.  No acute findings.   Electronically Signed   By: Earle Gell M.D.   On: 09/26/2014 08:00   Dg Tibia/fibula Left Port  09/26/2014   CLINICAL DATA:  Open ankle fracture  EXAM: PORTABLE LEFT TIBIA AND FIBULA - 2 VIEW  COMPARISON:  None.  FINDINGS: Laterally dislocated ankle with medial malleolus and posterior malleolus fractures. The distal fragments remain in continuity with the talus. There is no gross hindfoot fracturing. Subcutaneous gas extends to the level of the mid shin.  IMPRESSION: Open bimalleolar ankle fracture with lateral tibiotalar dislocation.   Electronically Signed   By: Monte Fantasia M.D.   On: 09/26/2014 06:07   Dg C-arm 1-60 Min  09/26/2014   CLINICAL DATA:  ORIF of left ankle fracture  EXAM: DG C-ARM 61-120 MIN; LEFT ANKLE COMPLETE - 3+ VIEW  COMPARISON:  None.  FLUOROSCOPY TIME:  Radiation Exposure Index (as provided by the fluoroscopic device): Not available  If the device does not provide the exposure index:  Fluoroscopy  Time:  25 seconds  Number of Acquired Images:  For  FINDINGS: Fixation sideplate is now noted along the distal fibula with multiple fixation screws. Two fixation screws are noted along the medial malleolus. The fracture dislocation has been reduced.  IMPRESSION: ORIF of left distal tibia and fibular fractures   Electronically Signed   By: Inez Catalina M.D.   On: 09/26/2014 12:33   Mm Screening Breast Tomo Bilateral  09/24/2014   CLINICAL DATA:  Screening. Implant explantation 1995.  EXAM: DIGITAL SCREENING BILATERAL MAMMOGRAM WITH 3D TOMO WITH CAD  COMPARISON:  Previous exam(s).  ACR Breast Density Category b: There are scattered areas of fibroglandular density.  FINDINGS: There are no findings suspicious for malignancy. Images were processed with CAD. Explantation changes are reidentified.  IMPRESSION: No mammographic evidence of malignancy. A result letter of this screening mammogram will be mailed directly to the patient.  RECOMMENDATION: Screening mammogram in one year. (Code:[id number]-B-01Y)  BI-RADS CATEGORY  2: Benign.   Electronically Signed   By: Conchita Paris M.D.   On: 09/24/2014 13:39    Disposition: skilled nursing facility      Discharge Instructions    Call MD / Call 911    Complete by:  As directed   If you experience chest pain or shortness of breath, CALL 911 and be transported to the hospital emergency room.  If you develope a fever above 101 F, pus (white drainage) or increased drainage or redness at the wound, or calf pain, call your surgeon's office.     Constipation Prevention    Complete by:  As directed   Drink plenty of fluids.  Prune juice may be helpful.  You may use a stool softener, such as Colace (over the counter) 100 mg twice a day.  Use MiraLax (over the counter) for constipation as needed.     Diet Carb Modified    Complete by:  As  directed      Increase activity slowly as tolerated    Complete by:  As directed      Non weight bearing    Complete by:  As directed           the patient will be nonweightbearing on the left ankle. She will need to ice and elevate her left leg as much as possible.  The patient will need a follow-up with Dr. [name] in his office in 2 weeks. The office phone number is [phone].   SignedErlene Senters 10/01/2014, 10:40 AM

## 2014-10-01 NOTE — Progress Notes (Signed)
Report called to [facility] and spoke with RN Timmothy Sours. Patient will be transported via [facility] this afternoon. Patient aware of discharge plan.

## 2014-10-01 NOTE — Discharge Planning (Signed)
Patient will discharge today per MD order. Patient will discharge to [facility] RN to call report prior to transportation to [phone] Transportation: [facility]  Of note, patient returned to hospital from home requesting placement after refusing.  Patient originally intended to go to [facility], but has since changed her mind and wishes to go to [facility].  [employer].  CSW sent discharge summary to SNF for review.  Packet is complete.  RN and patient aware of discharge plans.  Nonnie Done, [place] [phone]  Psychiatric & Orthopedics (5N 1-16) Clinical Social Worker

## 2014-10-01 NOTE — Progress Notes (Signed)
Subjective: 5 Days Post-Op Procedure(s) (LRB): OPEN REDUCTION INTERNAL FIXATION (ORIF) ANKLE FRACTURE (Left) Patient reports pain as mild.  The patient reports that she was taken by ambulance to her home yesterday and her medical equipment including a walker and wheelchair had not been delivered. She did not even get out of the ambulance and demanded that the ambulance bring her back to the hospital. The patient lives alone. Apparently her son was unable to care for her. She now admits that she would prefer to go to a skilled nursing facility. She denies significant left ankle pain.  Objective: Vital signs in last 24 hours: Temp:  [98.2 F (36.8 C)-98.8 F (37.1 C)] 98.2 F (36.8 C) (04/20 0518) Pulse Rate:  [75-87] 87 (04/20 0518) Resp:  [16-17] 16 (04/20 0518) BP: (124-150)/(61-65) 124/65 mmHg (04/20 0518) SpO2:  [95 %-98 %] 98 % (04/20 0518)  Intake/Output from previous day: 04/19 0701 - 04/20 0700 In: 720 [P.O.:720] Out: -  Intake/Output this shift: Total I/O In: 1200 [P.O.:1200] Out: -   No results for input(s): HGB in the last 72 hours. No results for input(s): WBC, RBC, HCT, PLT in the last 72 hours. No results for input(s): NA, K, CL, CO2, BUN, CREATININE, GLUCOSE, CALCIUM in the last 72 hours. No results for input(s): LABPT, INR in the last 72 hours. Left ankle exam: Posterior splint is intact. The dressing is clean and dry. She has normal sensation in her toes. She moves her toes actively. The patient is alert and oriented.  Assessment/Plan: 5 Days Post-Op Procedure(s) (LRB): OPEN REDUCTION INTERNAL FIXATION (ORIF) ANKLE FRACTURE (Left) Plan: This patient is in need of skilled nursing facility. I have spoken with the social worker here at the hospital and we will make arrangements for that hopefully today. She will be nonweightbearing on the left lower extremity. The [place] 2 form has been signed. She will need a follow-up with Dr. [name] in 2 weeks.   [place]  G 10/01/2014, 8:31 AM

## 2014-10-02 ENCOUNTER — Non-Acute Institutional Stay (SKILLED_NURSING_FACILITY): Payer: Medicare Other | Admitting: Registered Nurse

## 2014-10-02 ENCOUNTER — Encounter: Payer: Self-pay | Admitting: Registered Nurse

## 2014-10-02 DIAGNOSIS — K219 Gastro-esophageal reflux disease without esophagitis: Secondary | ICD-10-CM

## 2014-10-02 DIAGNOSIS — G4700 Insomnia, unspecified: Secondary | ICD-10-CM

## 2014-10-02 DIAGNOSIS — K5901 Slow transit constipation: Secondary | ICD-10-CM

## 2014-10-02 DIAGNOSIS — E785 Hyperlipidemia, unspecified: Secondary | ICD-10-CM | POA: Diagnosis not present

## 2014-10-02 DIAGNOSIS — S82892S Other fracture of left lower leg, sequela: Secondary | ICD-10-CM | POA: Diagnosis not present

## 2014-10-02 DIAGNOSIS — I10 Essential (primary) hypertension: Secondary | ICD-10-CM

## 2014-10-02 DIAGNOSIS — F418 Other specified anxiety disorders: Secondary | ICD-10-CM

## 2014-10-02 DIAGNOSIS — E119 Type 2 diabetes mellitus without complications: Secondary | ICD-10-CM | POA: Diagnosis not present

## 2014-10-02 NOTE — Progress Notes (Signed)
Patient ID: Debra Short, female   DOB: 1940-09-01, 74 y.o.   MRN: [id number]   Place of Service: [facility]  Allergies  Allergen Reactions  . Effexor [Venlafaxine] Other (See Comments)    REACTION:  unknown  . Nortriptyline Other (See Comments)    REACTION:  unknown  . Paxil [Paroxetine Hcl] Other (See Comments)    REACTION:  unknown  . Robaxin [Methocarbamol] Itching  . Zoloft [Sertraline Hcl] Other (See Comments)    REACTION: unknown    Code Status: DNR  Goals of Care: Comfort and Quality of Life/STR  Chief Complaint  Patient presents with  . Hospitalization Follow-up    HPI Review of hospital record showed 74 y.o. female with PMH of HTN, HLD, OA, GERD, migraines, large cell lymphoma, depression w/ anxiety, DM2 among others is being seen for a follow-up visit post hospital admission from 09/20/14 to 10/01/14 for open bimalleolar fracture of left ankle after a fall. This was reduced in the ED by Dr. [name] then I&D of left ankle with ORIF. Seen in room today. Reported pain is adequately controlled with current regimen. Last BM two days ago but normal bowel patter is daily. Denies any other concerns  Review of Systems Constitutional: Negative for fever, chills, and fatigue. HENT: Negative for ear pain, congestion, and sore throat Eyes: Negative for eye pain, eye discharge, and visual disturbance  Cardiovascular: Negative for chest pain, palpitations, and leg swelling Respiratory: Negative cough, shortness of breath, and wheezing.  Gastrointestinal: Negative for nausea and vomiting. Negative for abdominal pain, diarrhea and constipation.  Genitourinary: Negative for dysuria Musculoskeletal: Negative for uncontrolled pain Neurological: Negative for dizziness and headache Skin: Negative for rash and itchiness.   Psychiatric: Negative for depression  Past Medical History  Diagnosis Date  . Hyperlipidemia   . Hypertension   . Arthritis   . GERD (gastroesophageal  reflux disease)   . Vitamin D deficiency   . IBS (irritable bowel syndrome)   . Prediabetes   . Large cell lymphoma   . Diabetes mellitus without complication   . Depression   . Anxiety   . Headache   . Fibromyalgia     Past Surgical History  Procedure Laterality Date  . Orif ankle fracture Left 09/26/2014  . Abdominal hysterectomy    . Tonsillectomy    . Orif ankle fracture Left 09/26/2014    Procedure: OPEN REDUCTION INTERNAL FIXATION (ORIF) ANKLE FRACTURE;  Surgeon: [name], MD;  Location: [facility];  Service: Orthopedics;  Laterality: Left;    History  Substance Use Topics  . Smoking status: Never Smoker   . Smokeless tobacco: Never Used  . Alcohol Use: No    No family history on file.    Medication List       This list is accurate as of: 10/02/14  1:57 PM.  Always use your most recent med list.               ALPRAZolam 1 MG tablet  Commonly known as:  XANAX  Take 1 mg by mouth 2 (two) times daily as needed for anxiety.     aspirin 81 MG chewable tablet  Chew by mouth daily.     atorvastatin 80 MG tablet  Commonly known as:  LIPITOR  TAKE 1 TABLET BY MOUTH DAILY FOR CHLOESTEROL. DISCONTINUE PRAVASTATIN     bisoprolol-hydrochlorothiazide 5-6.25 MG per tablet  Commonly known as:  ZIAC  TAKE 1 TABLET BY MOUTH DAILY     celecoxib  200 MG capsule  Commonly known as:  CELEBREX  Take 200 mg by mouth daily as needed for mild pain.     cephALEXin 500 MG capsule  Commonly known as:  KEFLEX  Take 1 capsule (500 mg total) by mouth 3 (three) times daily.     docusate sodium 100 MG capsule  Commonly known as:  COLACE  Take 100 mg by mouth 2 (two) times daily.     esomeprazole 20 MG capsule  Commonly known as:  NEXIUM  Take 20 mg by mouth daily as needed (acid reflux).     FLUoxetine 40 MG capsule  Commonly known as:  PROZAC  TAKE 1 CAPSULE EVERY DAY FOR MOOD     gabapentin 300 MG capsule  Commonly known as:  NEURONTIN  Take 300 mg by mouth 2 (two) times  daily as needed (pain).     HYDROcodone-acetaminophen 10-325 MG per tablet  Commonly known as:  NORCO  Take 1 tablet by mouth every 6 (six) hours as needed for severe pain.     metFORMIN 500 MG 24 hr tablet  Commonly known as:  GLUCOPHAGE XR  Take 1 tablet (500 mg total) by mouth daily with breakfast.     montelukast 10 MG tablet  Commonly known as:  SINGULAIR  TAKE 1 TABLET EVERY DAY     multivitamin tablet  Take 1 tablet by mouth daily.     rizatriptan 10 MG tablet  Commonly known as:  MAXALT  Take 10 mg by mouth daily as needed for migraine.     sennosides-docusate sodium 8.6-50 MG tablet  Commonly known as:  SENOKOT-S  Take 2 tablets by mouth daily.     tiZANidine 4 MG capsule  Commonly known as:  ZANAFLEX  Take 2-4 mg by mouth 3 (three) times daily as needed for muscle spasms.     triamcinolone 55 MCG/ACT Aero nasal inhaler  Commonly known as:  NASACORT  Place 2 sprays into the nose daily as needed (for seasonal allergies).     VITAMIN D PO  Take 5,000-10,000 Units by mouth daily. Takes 5,000 units daily, except takes 10,000 units on Mondays, Wednesdays, and Fridays.     zolpidem 10 MG tablet  Commonly known as:  AMBIEN  Take 5 mg by mouth at bedtime.        Physical Exam  BP 99/59 mmHg  Pulse 78  Temp(Src) 97.9 F (36.6 C)  Resp 16  Ht 5\' 6"  (1.676 m)  Wt 164 lb 14.4 oz (74.798 kg)  BMI 26.63 kg/m2  Constitutional: WDWN elderly female in no acute distress. Conversant and pleasant HEENT: Normocephalic and atraumatic. PERRL. EOM intact. No icterus. Oral mucosa moist. Posterior pharynx clear of any exudate or lesions.  Neck: Supple and nontender. No lymphadenopathy, masses, or thyromegaly. No JVD or carotid bruits. Cardiac: Normal S1, S2. RRR without appreciable murmurs, rubs, or gallops. LLE with good cap refill. RLE pulse intact. Trace dependent edema  Respiratory: Unlabored respiration. Breath sounds clear bilaterally without rales, rhonchi, or  wheezes. GI: Audible bowel sounds in all quadrants. Soft, nontender, nondistended. Musculoskeletal: able to move all extremities. Limited ROM of LLE with splint in place.  Skin: Warm and dry. No rash noted. No erythema.  Neurological: Alert and oriented to self Psychiatric: Judgment and insight adequate. Appropriate mood and affect.   Labs Reviewed  CBC Latest Ref Rng 09/26/2014 06/26/2014 03/24/2014  WBC 4.0 - 10.5 K/uL 9.7 6.8 6.1  Hemoglobin 12.0 - 15.0 g/dL 11.0(L) 12.4 12.4  Hematocrit 36.0 - 46.0 % 34.7(L) 38.7 37.3  Platelets 150 - 400 K/uL 285 335 309    CMP Latest Ref Rng 09/27/2014 09/26/2014 06/26/2014  Glucose 70 - 99 mg/dL 112(H) 141(H) 99  BUN 6 - 23 mg/dL 7 11 14   Creatinine 0.50 - 1.10 mg/dL 0.76 0.87 0.71  Sodium 135 - 145 mmol/L 138 139 139  Potassium 3.5 - 5.1 mmol/L 4.0 4.0 4.5  Chloride 96 - 112 mmol/L 99 101 98  CO2 19 - 32 mmol/L 28 25 28   Calcium 8.4 - 10.5 mg/dL 8.6 9.1 9.5  Total Protein 6.0 - 8.3 g/dL - - 7.3  Total Bilirubin 0.2 - 1.2 mg/dL - - 0.7  Alkaline Phos 39 - 117 U/L - - 66  AST 0 - 37 U/L - - 20  ALT 0 - 35 U/L - - 17    Lab Results  Component Value Date   HGBA1C 6.4* 06/26/2014    Lab Results  Component Value Date   TSH 1.860 03/24/2014    Lipid Panel     Component Value Date/Time   CHOL 211* 06/26/2014 1130   TRIG 191* 06/26/2014 1130   HDL 38* 06/26/2014 1130   CHOLHDL 5.6 06/26/2014 1130   VLDL 38 06/26/2014 1130   LDLCALC 135* 06/26/2014 1130    Diagnostic Studies Reviewed 09/27/14: EKG: NSR  09/26/14: Portable left tibia and fibula: open bimalleolar ankle with lateral tibiotalar dislocation.   09/26/14: Ankle Xray ORIF of left distal tibia and fibular fractures  Assessment & Plan 1. Open fracture of left ankle, sequela S/p I&D with ORIF. Pain is adequately controlled with current regimen. Continue nocro 10/325mg  every six hours as needed for pain with zanaflex 2-4mg  three times daily as needed for spasms. Continue  keflex 500mg  prophylactically three times daily for now. I've sent a note to Dr. [name] for clarification about stop date for keflex. Continue NWB to LLE  and f/u with ortho.   2. Diabetes mellitus type 2, controlled Lat a1c 6.4. Continue metformin 500mg  daily. Monitor cbg daily  3. Gastroesophageal reflux disease without esophagitis Continue nexium 20mg  daily as needed  4. Essential hypertension Continue ziac 5/6.25mg  daily. Monitor bp  5. HLD (hyperlipidemia) LDL35. Continue lipitor 80mg  daily  6. Depression with anxiety Continue prozac 40mg  daily with xanax 1mg  bid prn. Continue to monitor for change in mood  7. Slow transit constipation Start colace 100mg  tiwce daily with senokot-s 8.6/50mg  2 tabs daily. Encourage hydration.   8. Insomnia Continue ambien 5mg  dail at bedtime.    Diagnostic Studies/Labs Ordered: cbc, bmp in 1 week  Time spent: 40 minutes with >50% of total time on care coordination   Family/Staff Communication Plan of care discussed with patient and nursing staff. Patient and nursing staff verbalized understanding and agree with plan of care. No additional questions or concerns reported.    Arthur Holms, MSN, AGNP-C [facility] [address] [place], [place] [address] [phone] [8am-5pm] After hours: [phone]

## 2014-10-05 ENCOUNTER — Encounter (HOSPITAL_COMMUNITY): Payer: Self-pay | Admitting: Orthopedic Surgery

## 2014-10-06 ENCOUNTER — Non-Acute Institutional Stay (SKILLED_NURSING_FACILITY): Payer: Medicare Other | Admitting: Internal Medicine

## 2014-10-06 DIAGNOSIS — G4700 Insomnia, unspecified: Secondary | ICD-10-CM

## 2014-10-06 DIAGNOSIS — E119 Type 2 diabetes mellitus without complications: Secondary | ICD-10-CM | POA: Diagnosis not present

## 2014-10-06 DIAGNOSIS — D62 Acute posthemorrhagic anemia: Secondary | ICD-10-CM

## 2014-10-06 DIAGNOSIS — F418 Other specified anxiety disorders: Secondary | ICD-10-CM

## 2014-10-06 DIAGNOSIS — E785 Hyperlipidemia, unspecified: Secondary | ICD-10-CM | POA: Diagnosis not present

## 2014-10-06 DIAGNOSIS — K219 Gastro-esophageal reflux disease without esophagitis: Secondary | ICD-10-CM | POA: Diagnosis not present

## 2014-10-06 DIAGNOSIS — I10 Essential (primary) hypertension: Secondary | ICD-10-CM | POA: Diagnosis not present

## 2014-10-06 DIAGNOSIS — K5901 Slow transit constipation: Secondary | ICD-10-CM | POA: Diagnosis not present

## 2014-10-06 DIAGNOSIS — S82842S Displaced bimalleolar fracture of left lower leg, sequela: Secondary | ICD-10-CM | POA: Diagnosis not present

## 2014-10-06 HISTORY — DX: Other specified anxiety disorders: F41.8

## 2014-10-06 NOTE — Progress Notes (Signed)
Patient ID: Debra Short, female   DOB: 1941/02/03, 74 y.o.   MRN: [id number]     Facility: [facility]    PCP: [name], MD  Code Status: DNR  Allergies  Allergen Reactions  . Effexor [Venlafaxine] Other (See Comments)    REACTION:  unknown  . Nortriptyline Other (See Comments)    REACTION:  unknown  . Paxil [Paroxetine Hcl] Other (See Comments)    REACTION:  unknown  . Robaxin [Methocarbamol] Itching  . Zoloft [Sertraline Hcl] Other (See Comments)    REACTION: unknown    Chief Complaint  Patient presents with  . New Admit To SNF     HPI:  74 year old patient is here for short term rehabilitation post hospital admission from 09/20/14 to 10/01/14 with open bimalleolar fracture of left ankle after a fall. She underwent reduction and ORIF. She is seen in her room today. She is in no distress. Her pain is under control with current regimen. She was having discomfort in her abdomen until yesterday. She had a bowel movement last night and feels a lot better. Denies any other concerns.  She has PMH of HTN, HLD, large cell lymphoma, DM2, gerd, depression, anxiety among others.  Review of Systems:  Constitutional: Negative for fever, chills, diaphoresis.  HENT: Negative for headache, congestion, nasal discharge Eyes: Negative for eye pain, blurred vision, double vision and discharge.  Respiratory: Negative for cough, shortness of breath and wheezing.   Cardiovascular: Negative for chest pain, palpitations, leg swelling.  Gastrointestinal: Negative for heartburn, nausea, vomiting, abdominal pain Genitourinary: Negative for dysuria Musculoskeletal: Negative for back pain, falls Skin: Negative for itching, rash.  Neurological: Negative for dizziness, tingling, focal weakness Psychiatric/Behavioral: mood stable as per patient. Denies anxiety    Past Medical History  Diagnosis Date  . Hyperlipidemia   . Hypertension   . Arthritis   . GERD  (gastroesophageal reflux disease)   . Vitamin D deficiency   . IBS (irritable bowel syndrome)   . Prediabetes   . Large cell lymphoma   . Diabetes mellitus without complication   . Depression   . Anxiety   . Headache   . Fibromyalgia    Past Surgical History  Procedure Laterality Date  . Orif ankle fracture Left 09/26/2014  . Abdominal hysterectomy    . Tonsillectomy    . Orif ankle fracture Left 09/26/2014    Procedure: OPEN REDUCTION INTERNAL FIXATION (ORIF) ANKLE FRACTURE;  Surgeon: [name], MD;  Location: [place];  Service: Orthopedics;  Laterality: Left;   Social History:   reports that she has never smoked. She has never used smokeless tobacco. She reports that she does not drink alcohol or use illicit drugs.  No family history on file.  Medications: Patient's Medications  New Prescriptions   No medications on file  Previous Medications   ALPRAZOLAM (XANAX) 1 MG TABLET    Take 1 mg by mouth 2 (two) times daily as needed for anxiety.   ASPIRIN 81 MG CHEWABLE TABLET    Chew by mouth daily.   ATORVASTATIN (LIPITOR) 80 MG TABLET    TAKE 1 TABLET BY MOUTH DAILY FOR CHLOESTEROL. DISCONTINUE PRAVASTATIN   BISOPROLOL-HYDROCHLOROTHIAZIDE (ZIAC) 5-6.25 MG PER TABLET    TAKE 1 TABLET BY MOUTH DAILY   CELECOXIB (CELEBREX) 200 MG CAPSULE    Take 200 mg by mouth daily as needed for mild pain.   CEPHALEXIN (KEFLEX) 500 MG CAPSULE    Take 1 capsule (500 mg  total) by mouth 3 (three) times daily.   CHOLECALCIFEROL (VITAMIN D PO)    Take 5,000-10,000 Units by mouth daily. Takes 5,000 units daily, except takes 10,000 units on Mondays, Wednesdays, and Fridays.   DOCUSATE SODIUM (COLACE) 100 MG CAPSULE    Take 100 mg by mouth 2 (two) times daily.   ESOMEPRAZOLE (NEXIUM) 20 MG CAPSULE    Take 20 mg by mouth daily as needed (acid reflux).    FLUOXETINE (PROZAC) 40 MG CAPSULE    TAKE 1 CAPSULE EVERY DAY FOR MOOD   GABAPENTIN (NEURONTIN) 300 MG CAPSULE    Take 300 mg by mouth 2 (two) times daily  as needed (pain).    HYDROCODONE-ACETAMINOPHEN (NORCO) 10-325 MG PER TABLET    Take 1 tablet by mouth every 6 (six) hours as needed for severe pain.   METFORMIN (GLUCOPHAGE XR) 500 MG 24 HR TABLET    Take 1 tablet (500 mg total) by mouth daily with breakfast.   MONTELUKAST (SINGULAIR) 10 MG TABLET    TAKE 1 TABLET EVERY DAY   MULTIPLE VITAMIN (MULTIVITAMIN) TABLET    Take 1 tablet by mouth daily.   RIZATRIPTAN (MAXALT) 10 MG TABLET    Take 10 mg by mouth daily as needed for migraine.    SENNOSIDES-DOCUSATE SODIUM (SENOKOT-S) 8.6-50 MG TABLET    Take 2 tablets by mouth daily.   TIZANIDINE (ZANAFLEX) 4 MG CAPSULE    Take 2-4 mg by mouth 3 (three) times daily as needed for muscle spasms.   TRIAMCINOLONE (NASACORT) 55 MCG/ACT AERO NASAL INHALER    Place 2 sprays into the nose daily as needed (for seasonal allergies).    ZOLPIDEM (AMBIEN) 10 MG TABLET    Take 5 mg by mouth at bedtime.   Modified Medications   No medications on file  Discontinued Medications   No medications on file     Physical Exam:  Filed Vitals:   10/06/14 1151  BP: 131/82  Pulse: 83  Temp: 97.8 F (36.6 C)  Resp: 18  SpO2: 96%    General- elderly female, in no acute distress Head- normocephalic, atraumatic Throat- moist mucus membrane, normal oropharynx, dentition is  Eyes- PERRLA, EOMI, no pallor, no icterus, no discharge Neck- no cervical lymphadenopathy Cardiovascular- normal s1,s2, no murmurs, palpable dorsalis pedis and radial pulses Respiratory- bilateral clear to auscultation, no wheeze, no rhonchi, no crackles, no use of accessory muscles Abdomen- bowel sounds present, soft, non tender Musculoskeletal- able to move all 4 extremities, left leg in splint, able to move her toes, normal color of the toes, NWB to left foot  Neurological- no focal deficit Skin- warm and dry Psychiatry- alert and oriented to person, place and time, normal mood and affect    Labs reviewed: Basic Metabolic Panel:  Recent  Labs  10/09/13 1547 03/24/14 1239 06/26/14 1130 09/26/14 0507 09/27/14 0640  NA 139 139 139 139 138  K 5.0 4.6 4.5 4.0 4.0  CL 96 100 98 101 99  CO2 30 29 28 25 28   GLUCOSE 98 105* 99 141* 112*  BUN 19 13 14 11 7   CREATININE 0.71 0.77 0.71 0.87 0.76  CALCIUM 10.0 9.8 9.5 9.1 8.6  MG 2.3 2.1  --   --   --    Liver Function Tests:  Recent Labs  10/09/13 1547 03/24/14 1239 06/26/14 1130  AST 18 21 20   ALT 16 17 17   ALKPHOS 62 68 66  BILITOT 0.6 0.7 0.7  PROT 7.1 7.0 7.3  ALBUMIN  4.4 4.4 4.1   No results for input(s): LIPASE, AMYLASE in the last 8760 hours. No results for input(s): AMMONIA in the last 8760 hours. CBC:  Recent Labs  03/24/14 1239 06/26/14 1130 09/26/14 0507  WBC 6.1 6.8 9.7  NEUTROABS 3.5 4.0 6.3  HGB 12.4 12.4 11.0*  HCT 37.3 38.7 34.7*  MCV 88.8 88.2 87.6  PLT 309 335 285   Cardiac Enzymes: No results for input(s): CKTOTAL, CKMB, CKMBINDEX, TROPONINI in the last 8760 hours. BNP: Invalid input(s): POCBNP CBG:  Recent Labs  10/01/14 0032 10/01/14 0341 10/01/14 0644  GLUCAP 96 103* 88     Assessment/Plan  Open fracture of left ankle S/p ORIF. Pain is adequately controlled with current regimen. Continue nocro 10/325mg  every six hours as needed for pain with zanaflex 2-4mg  three times daily as needed for spasms. Continue keflex 500 mg three times daily for now and will need to call Dr Jackalyn [place]ard office for stop date on antibiotics. Continue this for now until we get further response from orthopedics. NWB to LLE for now. On aspirin 81 mg daily for dvt prophylaxis. Will have patient work with PT/OT as tolerated to regain strength and restore function.  Fall precautions are in place.  Constipation Stable, continue colace and senokot s for now with hydration and monitor. Has prn dulcolax suppository   Blood loss anemia Post op, monitor h&h  Diabetes mellitus type 2 Lat a1c 6.4. Continue metformin 500mg  daily. Monitor cbg   HTN Continue  bisoprolol-hctz home regimen, monitor bp  HLD Continue lipitor 80 mg daily  gerd Stable. Continue nexium 20mg  daily as needed  Depression with anxiety Continue prozac 40mg  daily with xanax 1mg  bid prn.    Insomnia Continue ambien 5mg  daily   Goals of care: short term rehabilitation   Labs/tests ordered: cbc,bmp  Family/ staff Communication: reviewed care plan with patient and nursing supervisor    Blanchie Serve, MD  [facility] [phone] (Monday-Friday 8 am - 5 pm) [phone] (afterhours)
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
## 2014-10-09 LAB — CBC AND DIFFERENTIAL
HCT: 36 % (ref 36–46)
Hemoglobin: 11.5 g/dL — AB (ref 12.0–16.0)
Platelets: 466 10*3/uL — AB (ref 150–399)
WBC: 7.7 10^3/mL

## 2014-10-09 LAB — BASIC METABOLIC PANEL
BUN: 14 mg/dL (ref 4–21)
Creatinine: 0.8 mg/dL (ref 0.5–1.1)
Glucose: 84 mg/dL
Potassium: 3.6 mmol/L (ref 3.4–5.3)
Sodium: 138 mmol/L (ref 137–147)

## 2014-10-13 ENCOUNTER — Encounter: Payer: Self-pay | Admitting: Internal Medicine

## 2014-10-14 ENCOUNTER — Other Ambulatory Visit: Payer: Self-pay | Admitting: *Deleted

## 2014-10-14 DIAGNOSIS — Z9889 Other specified postprocedural states: Secondary | ICD-10-CM | POA: Diagnosis not present

## 2014-10-14 MED ORDER — HYDROCODONE-ACETAMINOPHEN 10-325 MG PO TABS: ORAL_TABLET | ORAL | Status: DC

## 2014-10-14 NOTE — Telephone Encounter (Signed)
[facility] 

## 2014-10-15 ENCOUNTER — Other Ambulatory Visit: Payer: Self-pay | Admitting: *Deleted

## 2014-10-15 ENCOUNTER — Encounter: Payer: Self-pay | Admitting: Registered Nurse

## 2014-10-15 ENCOUNTER — Non-Acute Institutional Stay (SKILLED_NURSING_FACILITY): Payer: Medicare Other | Admitting: Registered Nurse

## 2014-10-15 DIAGNOSIS — K219 Gastro-esophageal reflux disease without esophagitis: Secondary | ICD-10-CM | POA: Diagnosis not present

## 2014-10-15 DIAGNOSIS — S82842S Displaced bimalleolar fracture of left lower leg, sequela: Secondary | ICD-10-CM

## 2014-10-15 MED ORDER — HYDROCODONE-ACETAMINOPHEN 10-325 MG PO TABS: ORAL_TABLET | ORAL | Status: DC
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
## 2014-10-16 ENCOUNTER — Encounter: Payer: Self-pay | Admitting: Registered Nurse

## 2014-10-16 ENCOUNTER — Non-Acute Institutional Stay (SKILLED_NURSING_FACILITY): Payer: Medicare Other | Admitting: Registered Nurse

## 2014-10-16 DIAGNOSIS — S82842S Displaced bimalleolar fracture of left lower leg, sequela: Secondary | ICD-10-CM | POA: Diagnosis not present

## 2014-10-16 DIAGNOSIS — K5901 Slow transit constipation: Secondary | ICD-10-CM

## 2014-10-16 DIAGNOSIS — D62 Acute posthemorrhagic anemia: Secondary | ICD-10-CM | POA: Diagnosis not present

## 2014-10-16 DIAGNOSIS — K219 Gastro-esophageal reflux disease without esophagitis: Secondary | ICD-10-CM | POA: Diagnosis not present

## 2014-10-16 DIAGNOSIS — G4700 Insomnia, unspecified: Secondary | ICD-10-CM | POA: Diagnosis not present

## 2014-10-16 DIAGNOSIS — E119 Type 2 diabetes mellitus without complications: Secondary | ICD-10-CM

## 2014-10-16 DIAGNOSIS — F418 Other specified anxiety disorders: Secondary | ICD-10-CM

## 2014-10-16 DIAGNOSIS — I10 Essential (primary) hypertension: Secondary | ICD-10-CM | POA: Diagnosis not present

## 2014-10-16 DIAGNOSIS — E785 Hyperlipidemia, unspecified: Secondary | ICD-10-CM

## 2014-10-16 NOTE — Progress Notes (Signed)
Patient ID: Debra Short, female   DOB: 1940/11/27, 74 y.o.   MRN: [id number]   Place of Service: [facility]  Allergies  Allergen Reactions  . Effexor [Venlafaxine] Other (See Comments)    REACTION:  unknown  . Nortriptyline Other (See Comments)    REACTION:  unknown  . Paxil [Paroxetine Hcl] Other (See Comments)    REACTION:  unknown  . Robaxin [Methocarbamol] Itching  . Zoloft [Sertraline Hcl] Other (See Comments)    REACTION: unknown    Code Status: DNR  Goals of Care: Comfort and Quality of Life/STR  Chief Complaint  Patient presents with  . Discharge Note    HPI 74 y.o. female with PMH of HTN, HLD, OA, GERD, migraines, large cell lymphoma, depression w/ anxiety, DM2 among others is being seen for an unexpected discharged visit. She was here for short term rehab post hospital admission from 09/20/14 to 10/01/14 for open bimalleolar fracture of left ankle after a fall. This was reduced in the ED by Dr. [name] then I&D of left ankle with ORIF. She has worked well with therapy team and is stable to be discharged home with [facility] PT/OT and DME (Bariatric FWW and leg rest). Seen in room today. Reported pain is adequately controlled with current regimen and improvement of acid reflux. No complaints verbalized. She's excited about going home.   Review of Systems Constitutional: Negative for fever, chills, and fatigue. HENT: Negative for ear pain, congestion, and sore throat Eyes: Negative for eye pain, eye discharge, and visual disturbance  Cardiovascular: Negative for chest pain, palpitations, and leg swelling Respiratory: Negative cough, shortness of breath, and wheezing.  Gastrointestinal: Negative for nausea and vomiting. Negative for abdominal pain, diarrhea and constipation.  Genitourinary: Negative for dysuria Musculoskeletal: Negative for uncontrolled pain Neurological: Negative for dizziness and headache Skin: Negative for rash and itchiness.   Psychiatric:  Negative for depression  Past Medical History  Diagnosis Date  . Hyperlipidemia   . Hypertension   . Arthritis   . GERD (gastroesophageal reflux disease)   . Vitamin D deficiency   . IBS (irritable bowel syndrome)   . Prediabetes   . Large cell lymphoma   . Diabetes mellitus without complication   . Depression   . Anxiety   . Headache   . Fibromyalgia     Past Surgical History  Procedure Laterality Date  . Orif ankle fracture Left 09/26/2014  . Abdominal hysterectomy    . Tonsillectomy    . Orif ankle fracture Left 09/26/2014    Procedure: OPEN REDUCTION INTERNAL FIXATION (ORIF) ANKLE FRACTURE;  Surgeon: [name], MD;  Location: [place];  Service: Orthopedics;  Laterality: Left;    History  Substance Use Topics  . Smoking status: Never Smoker   . Smokeless tobacco: Never Used  . Alcohol Use: No    No family history on file.    Medication List       This list is accurate as of: 10/16/14 12:24 PM.  Always use your most recent med list.               ALPRAZolam 1 MG tablet  Commonly known as:  XANAX  Take 1 mg by mouth 2 (two) times daily as needed for anxiety.     aspirin 81 MG chewable tablet  Chew by mouth daily.     atorvastatin 80 MG tablet  Commonly known as:  LIPITOR  TAKE 1 TABLET BY MOUTH DAILY FOR CHLOESTEROL. DISCONTINUE PRAVASTATIN  bisoprolol-hydrochlorothiazide 5-6.25 MG per tablet  Commonly known as:  ZIAC  TAKE 1 TABLET BY MOUTH DAILY     celecoxib 200 MG capsule  Commonly known as:  CELEBREX  Take 200 mg by mouth daily as needed for mild pain.     docusate sodium 100 MG capsule  Commonly known as:  COLACE  Take 100 mg by mouth 2 (two) times daily.     esomeprazole 20 MG capsule  Commonly known as:  NEXIUM  Take 20 mg by mouth 2 (two) times daily before a meal.     FLUoxetine 40 MG capsule  Commonly known as:  PROZAC  TAKE 1 CAPSULE EVERY DAY FOR MOOD     gabapentin 300 MG capsule  Commonly known as:  NEURONTIN  Take 300 mg  by mouth 2 (two) times daily as needed (pain).     HYDROcodone-acetaminophen 10-325 MG per tablet  Commonly known as:  NORCO  Take 1 tablet by mouth every 4 (four) hours as needed.     metFORMIN 500 MG 24 hr tablet  Commonly known as:  GLUCOPHAGE XR  Take 1 tablet (500 mg total) by mouth daily with breakfast.     montelukast 10 MG tablet  Commonly known as:  SINGULAIR  TAKE 1 TABLET EVERY DAY     multivitamin tablet  Take 1 tablet by mouth daily.     rizatriptan 10 MG tablet  Commonly known as:  MAXALT  Take 10 mg by mouth daily as needed for migraine.     sennosides-docusate sodium 8.6-50 MG tablet  Commonly known as:  SENOKOT-S  Take 2 tablets by mouth daily.     tiZANidine 4 MG capsule  Commonly known as:  ZANAFLEX  Take 2-4 mg by mouth 3 (three) times daily as needed for muscle spasms.     triamcinolone 55 MCG/ACT Aero nasal inhaler  Commonly known as:  NASACORT  Place 2 sprays into the nose daily as needed (for seasonal allergies).     VITAMIN D PO  Take 5,000-10,000 Units by mouth daily. Takes 5,000 units daily, except takes 10,000 units on Mondays, Wednesdays, and Fridays.     zolpidem 10 MG tablet  Commonly known as:  AMBIEN  Take 5 mg by mouth at bedtime.        Physical Exam  BP 108/67 mmHg  Pulse 71  Temp(Src) 97.9 F (36.6 C)  Resp 17  Ht 5\' 6"  (1.676 m)  Wt 164 lb (74.39 kg)  BMI 26.48 kg/m2  SpO2 94%  Constitutional: WDWN elderly female in no acute distress. Conversant and pleasant HEENT: Normocephalic and atraumatic. PERRL. EOM intact. No icterus. Oral mucosa moist. Posterior pharynx clear of any exudate or lesions.  Neck: Supple and nontender. No lymphadenopathy, masses, or thyromegaly. No JVD or carotid bruits. Cardiac: Normal S1, S2. RRR without appreciable murmurs, rubs, or gallops. Distal pulses intact. Trace dependent edema  Respiratory: Unlabored respiration. Breath sounds clear bilaterally without rales, rhonchi, or wheezes. GI:  Audible bowel sounds in all quadrants. Soft, nontender, nondistended. Musculoskeletal: able to move all extremities. Limited ROM of LLE with splint and ace wrap in place.  Skin: Warm and dry. No rash noted. No erythema.  Neurological: Alert and oriented to self Psychiatric: Judgment and insight adequate. Appropriate mood and affect.   Labs Reviewed  CBC Latest Ref Rng 10/09/2014 09/26/2014 06/26/2014  WBC - 7.7 9.7 6.8  Hemoglobin 12.0 - 16.0 g/dL 11.5(A) 11.0(L) 12.4  Hematocrit 36 - 46 % 36 34.7(L) 38.7  Platelets 150 - 399 K/L 466(A) 285 335    CMP Latest Ref Rng 10/09/2014 09/27/2014 09/26/2014  Glucose 70 - 99 mg/dL - 112(H) 141(H)  BUN 4 - 21 mg/dL 14 7 11   Creatinine 0.5 - 1.1 mg/dL 0.8 0.76 0.87  Sodium 137 - 147 mmol/L 138 138 139  Potassium 3.4 - 5.3 mmol/L 3.6 4.0 4.0  Chloride 96 - 112 mmol/L - 99 101  CO2 19 - 32 mmol/L - 28 25  Calcium 8.4 - 10.5 mg/dL - 8.6 9.1  Total Protein 6.0 - 8.3 g/dL - - -  Total Bilirubin 0.2 - 1.2 mg/dL - - -  Alkaline Phos 39 - 117 U/L - - -  AST 0 - 37 U/L - - -  ALT 0 - 35 U/L - - -    Lab Results  Component Value Date   HGBA1C 6.4* 06/26/2014    Lab Results  Component Value Date   TSH 1.860 03/24/2014    Lipid Panel     Component Value Date/Time   CHOL 211* 06/26/2014 1130   TRIG 191* 06/26/2014 1130   HDL 38* 06/26/2014 1130   CHOLHDL 5.6 06/26/2014 1130   VLDL 38 06/26/2014 1130   LDLCALC 135* 06/26/2014 1130    Diagnostic Studies Reviewed 09/27/14: EKG: NSR  09/26/14: Portable left tibia and fibula: open bimalleolar ankle with lateral tibiotalar dislocation.   09/26/14: Ankle Xray ORIF of left distal tibia and fibular fractures  Assessment & Plan 1. Open fracture of left ankle, sequela S/p I&D with ORIF. Pain is adequately controlled with current regimen. Continue nocro 10/325mg  every four hours as needed for pain with zanaflex 2-4mg  three times daily as needed for spasms. Continue to work with [name] PT/OT for  gait/strength/balance training to restore and maximize function. Continue to f/u with ortho.   2. Diabetes mellitus type 2, controlled Lat a1c 6.4. Continue metformin 500mg  daily. F/u with pcp  3. Gastroesophageal reflux disease without esophagitis Improved. Continue nexium 20mg  twice daily  4. Essential hypertension Stable. Continue ziac 5/6.25mg  daily.   5. HLD (hyperlipidemia) LDL35. Continue lipitor 80mg  daily  6. Depression with anxiety Mood stable. Continue prozac 40mg  daily with xanax 1mg  bid prn for anxiety   7. Slow transit constipation Stable. Continue colace 100mg  tiwce daily with senokot-s 8.6/50mg  2 tabs daily. Encourage hydration.   8. Insomnia Stable. Continue ambien 5mg  dail at bedtime.   9. Acute blood loss anemia Stable. Last hgb 11.5. PCP to monitor h&h  Home health services: [facility] PT/OT DME required: bariatric FWW with leg rest PCP follow-up: Please schedule f/u appt with PCP w/in 1-2 weeks of discharge from skilled nursing facility  30-day supply of prescription medications provided (#30 xanax 1mg , #60zanaflex 2mg , #30 norco 10/325mg , #30 ambien 5mg )    Family/Staff Communication Plan of care discussed with patient and nursing staff. Patient and nursing staff verbalized understanding and agree with plan of care. No additional questions or concerns reported.    Arthur Holms, MSN, AGNP-C [facility] [address] [place], [place] [address] [phone] [8am-5pm] After hours: [phone]

## 2014-10-21 DIAGNOSIS — Z9889 Other specified postprocedural states: Secondary | ICD-10-CM | POA: Diagnosis not present

## 2014-10-22 DIAGNOSIS — I10 Essential (primary) hypertension: Secondary | ICD-10-CM | POA: Diagnosis not present

## 2014-10-22 DIAGNOSIS — Z9181 History of falling: Secondary | ICD-10-CM | POA: Diagnosis not present

## 2014-10-22 DIAGNOSIS — E119 Type 2 diabetes mellitus without complications: Secondary | ICD-10-CM | POA: Diagnosis not present

## 2014-10-22 DIAGNOSIS — Z4789 Encounter for other orthopedic aftercare: Secondary | ICD-10-CM | POA: Diagnosis not present

## 2014-10-22 DIAGNOSIS — S82842D Displaced bimalleolar fracture of left lower leg, subsequent encounter for closed fracture with routine healing: Secondary | ICD-10-CM | POA: Diagnosis not present

## 2014-10-22 DIAGNOSIS — M1990 Unspecified osteoarthritis, unspecified site: Secondary | ICD-10-CM | POA: Diagnosis not present

## 2014-10-22 DIAGNOSIS — F418 Other specified anxiety disorders: Secondary | ICD-10-CM | POA: Diagnosis not present

## 2014-10-25 DIAGNOSIS — M1990 Unspecified osteoarthritis, unspecified site: Secondary | ICD-10-CM | POA: Diagnosis not present

## 2014-10-25 DIAGNOSIS — I10 Essential (primary) hypertension: Secondary | ICD-10-CM | POA: Diagnosis not present

## 2014-10-25 DIAGNOSIS — E119 Type 2 diabetes mellitus without complications: Secondary | ICD-10-CM | POA: Diagnosis not present

## 2014-10-25 DIAGNOSIS — F418 Other specified anxiety disorders: Secondary | ICD-10-CM | POA: Diagnosis not present

## 2014-10-25 DIAGNOSIS — S82842D Displaced bimalleolar fracture of left lower leg, subsequent encounter for closed fracture with routine healing: Secondary | ICD-10-CM | POA: Diagnosis not present

## 2014-10-25 DIAGNOSIS — Z9181 History of falling: Secondary | ICD-10-CM | POA: Diagnosis not present

## 2014-10-27 DIAGNOSIS — I10 Essential (primary) hypertension: Secondary | ICD-10-CM | POA: Diagnosis not present

## 2014-10-27 DIAGNOSIS — M1990 Unspecified osteoarthritis, unspecified site: Secondary | ICD-10-CM | POA: Diagnosis not present

## 2014-10-27 DIAGNOSIS — E119 Type 2 diabetes mellitus without complications: Secondary | ICD-10-CM | POA: Diagnosis not present

## 2014-10-27 DIAGNOSIS — S82842D Displaced bimalleolar fracture of left lower leg, subsequent encounter for closed fracture with routine healing: Secondary | ICD-10-CM | POA: Diagnosis not present

## 2014-10-27 DIAGNOSIS — F418 Other specified anxiety disorders: Secondary | ICD-10-CM | POA: Diagnosis not present

## 2014-10-27 DIAGNOSIS — Z9181 History of falling: Secondary | ICD-10-CM | POA: Diagnosis not present

## 2014-10-29 DIAGNOSIS — E119 Type 2 diabetes mellitus without complications: Secondary | ICD-10-CM | POA: Diagnosis not present

## 2014-10-29 DIAGNOSIS — F418 Other specified anxiety disorders: Secondary | ICD-10-CM | POA: Diagnosis not present

## 2014-10-29 DIAGNOSIS — I10 Essential (primary) hypertension: Secondary | ICD-10-CM | POA: Diagnosis not present

## 2014-10-29 DIAGNOSIS — Z9181 History of falling: Secondary | ICD-10-CM | POA: Diagnosis not present

## 2014-10-29 DIAGNOSIS — M1990 Unspecified osteoarthritis, unspecified site: Secondary | ICD-10-CM | POA: Diagnosis not present

## 2014-10-29 DIAGNOSIS — S82842D Displaced bimalleolar fracture of left lower leg, subsequent encounter for closed fracture with routine healing: Secondary | ICD-10-CM | POA: Diagnosis not present

## 2014-10-30 ENCOUNTER — Inpatient Hospital Stay (HOSPITAL_COMMUNITY)
Admission: EM | Admit: 2014-10-30 | Discharge: 2014-11-01 | DRG: 392 | Disposition: A | Discharge status: Home Health | Payer: Medicare Other | Attending: Internal Medicine | Admitting: Internal Medicine

## 2014-10-30 ENCOUNTER — Encounter (HOSPITAL_COMMUNITY): Payer: Self-pay

## 2014-10-30 DIAGNOSIS — R197 Diarrhea, unspecified: Secondary | ICD-10-CM | POA: Diagnosis not present

## 2014-10-30 DIAGNOSIS — E785 Hyperlipidemia, unspecified: Secondary | ICD-10-CM | POA: Diagnosis present

## 2014-10-30 DIAGNOSIS — E119 Type 2 diabetes mellitus without complications: Secondary | ICD-10-CM | POA: Diagnosis not present

## 2014-10-30 DIAGNOSIS — D649 Anemia, unspecified: Secondary | ICD-10-CM | POA: Diagnosis not present

## 2014-10-30 DIAGNOSIS — E1122 Type 2 diabetes mellitus with diabetic chronic kidney disease: Secondary | ICD-10-CM

## 2014-10-30 DIAGNOSIS — I10 Essential (primary) hypertension: Secondary | ICD-10-CM | POA: Diagnosis present

## 2014-10-30 DIAGNOSIS — K219 Gastro-esophageal reflux disease without esophagitis: Secondary | ICD-10-CM | POA: Diagnosis present

## 2014-10-30 DIAGNOSIS — R109 Unspecified abdominal pain: Secondary | ICD-10-CM | POA: Diagnosis not present

## 2014-10-30 DIAGNOSIS — N182 Chronic kidney disease, stage 2 (mild): Secondary | ICD-10-CM

## 2014-10-30 DIAGNOSIS — E876 Hypokalemia: Secondary | ICD-10-CM | POA: Diagnosis present

## 2014-10-30 DIAGNOSIS — N3289 Other specified disorders of bladder: Secondary | ICD-10-CM | POA: Diagnosis not present

## 2014-10-30 DIAGNOSIS — A09 Infectious gastroenteritis and colitis, unspecified: Secondary | ICD-10-CM | POA: Diagnosis not present

## 2014-10-30 DIAGNOSIS — K529 Noninfective gastroenteritis and colitis, unspecified: Secondary | ICD-10-CM | POA: Diagnosis not present

## 2014-10-30 DIAGNOSIS — Z79899 Other long term (current) drug therapy: Secondary | ICD-10-CM

## 2014-10-30 DIAGNOSIS — Z90710 Acquired absence of both cervix and uterus: Secondary | ICD-10-CM

## 2014-10-30 DIAGNOSIS — R1084 Generalized abdominal pain: Secondary | ICD-10-CM | POA: Diagnosis not present

## 2014-10-30 DIAGNOSIS — M797 Fibromyalgia: Secondary | ICD-10-CM | POA: Diagnosis present

## 2014-10-30 DIAGNOSIS — K5730 Diverticulosis of large intestine without perforation or abscess without bleeding: Secondary | ICD-10-CM | POA: Diagnosis not present

## 2014-10-30 DIAGNOSIS — R7303 Prediabetes: Secondary | ICD-10-CM

## 2014-10-30 DIAGNOSIS — Z7982 Long term (current) use of aspirin: Secondary | ICD-10-CM

## 2014-10-30 MED ORDER — ONDANSETRON HCL 4 MG/2ML IJ SOLN
4.0000 mg | Freq: Once | INTRAMUSCULAR | Status: AC
  Administered 2014-10-31: 4 mg via INTRAVENOUS
  Filled 2014-10-30: qty 2

## 2014-10-30 MED ORDER — SODIUM CHLORIDE 0.9 % IV BOLUS (SEPSIS)
500.0000 mL | Freq: Once | INTRAVENOUS | Status: AC
  Administered 2014-10-30: 500 mL via INTRAVENOUS

## 2014-10-30 NOTE — ED Notes (Addendum)
Pt presents from home via EMS with c/o diarrhea for 2 hours. Pt denies any vomiting or abdominal pain.

## 2014-10-30 NOTE — ED Notes (Signed)
Bed: YZ70 Expected date:  Expected time:  Means of arrival:  Comments: EMS diarrhea x 2 hrs

## 2014-10-30 NOTE — ED Provider Notes (Signed)
CSN: [id number]     Arrival date & time 10/30/14  2242 History   First MD Initiated Contact with Patient 10/30/14 2305     Chief Complaint  Patient presents with  . Diarrhea     (Consider location/radiation/quality/duration/timing/severity/associated sxs/prior Treatment) HPI Patient with recent admission for open reduction internal fixation of the left ankle. She states she's [place] and discharged to a rehabilitation facility. Over the last week she has then been transferred home. She states that today she woke up not feeling well. She began having stomach cramps around 3 PM. She's been having multiple episodes of diarrhea with incontinence. She admits to nausea but no vomiting. No fever. She has continued abdominal cramping. Past Medical History  Diagnosis Date  . Hyperlipidemia   . Hypertension   . Arthritis   . GERD (gastroesophageal reflux disease)   . Vitamin D deficiency   . IBS (irritable bowel syndrome)   . Prediabetes   . Large cell lymphoma   . Diabetes mellitus without complication   . Depression   . Anxiety   . Headache   . Fibromyalgia    Past Surgical History  Procedure Laterality Date  . Orif ankle fracture Left 09/26/2014  . Abdominal hysterectomy    . Tonsillectomy    . Orif ankle fracture Left 09/26/2014    Procedure: OPEN REDUCTION INTERNAL FIXATION (ORIF) ANKLE FRACTURE;  Surgeon: [name], MD;  Location: [place];  Service: Orthopedics;  Laterality: Left;   Family History  Problem Relation Age of Onset  . Cancer Mother   . Cancer Father   . Cancer Brother   . Cancer Maternal Grandfather    History  Substance Use Topics  . Smoking status: Never Smoker   . Smokeless tobacco: Never Used  . Alcohol Use: No   OB History    No data available     Review of Systems  Constitutional: Negative for fever and chills.  Respiratory: Negative for cough and shortness of breath.   Cardiovascular: Negative for chest pain.  Gastrointestinal: Positive  for nausea, abdominal pain and diarrhea. Negative for vomiting and constipation.  Genitourinary: Negative for dysuria and flank pain.  Musculoskeletal: Negative for myalgias, back pain, neck pain and neck stiffness.  Skin: Negative for rash and wound.  Neurological: Negative for dizziness, weakness, light-headedness, numbness and headaches.  All other systems reviewed and are negative.     Allergies  Effexor; Nortriptyline; Paxil; Robaxin; Tizanidine; and Zoloft  Home Medications   Prior to Admission medications   Medication Sig Start Date End Date Taking? Authorizing Provider  ALPRAZolam Duanne Moron) 1 MG tablet Take 1 mg by mouth 2 (two) times daily as needed for anxiety.   Yes Historical Provider, MD  aspirin 81 MG chewable tablet Chew by mouth daily.   Yes Historical Provider, MD  atorvastatin (LIPITOR) 80 MG tablet TAKE 1 TABLET BY MOUTH DAILY FOR CHLOESTEROL. DISCONTINUE PRAVASTATIN Patient taking differently: TAKE 1/3 TABLET BY MOUTH DAILY FOR CHOLESTEROL. DISCONTINUE PRAVASTATIN   Yes [name], PA-C  bisoprolol-hydrochlorothiazide [facility]) 5-6.25 MG per tablet TAKE 1 TABLET BY MOUTH DAILY 06/29/14  Yes [name], PA-C  celecoxib (CELEBREX) 200 MG capsule Take 200 mg by mouth daily as needed for mild pain.   Yes Historical Provider, MD  Cholecalciferol (VITAMIN D PO) Take 5,000-10,000 Units by mouth daily. Takes 5,000 units daily, except takes 10,000 units on Mondays, Wednesdays, and Fridays.   Yes Historical Provider, MD  esomeprazole (NEXIUM) 20 MG capsule Take 20 mg by  mouth 2 (two) times daily before a meal.    Yes Historical Provider, MD  FLUoxetine (PROZAC) 40 MG capsule TAKE 1 CAPSULE EVERY DAY FOR MOOD Patient taking differently: Take 40 mg by mouth daily.  05/01/14  Yes Unk [name], MD  gabapentin (NEURONTIN) 300 MG capsule Take 300 mg by mouth 2 (two) times daily as needed (pain).  03/12/14  Yes Historical Provider, MD  HYDROcodone-acetaminophen (NORCO) 10-325 MG per  tablet Take 1 tablet by mouth every 4 (four) hours as needed for moderate pain.    Yes Historical Provider, MD  metFORMIN (GLUCOPHAGE XR) 500 MG 24 hr tablet Take 1 tablet (500 mg total) by mouth daily with breakfast. 06/27/14  Yes [name], PA-C  montelukast (SINGULAIR) 10 MG tablet TAKE 1 TABLET EVERY DAY Patient taking differently: TAKE 1 TABLET EVERY DAY AS NEEDED FOR ALLERGIES 02/24/14  Yes Unk [name], MD  Multiple Vitamin (MULTIVITAMIN) tablet Take 1 tablet by mouth daily.   Yes Historical Provider, MD  rizatriptan (MAXALT) 10 MG tablet Take 10 mg by mouth daily as needed for migraine.  04/01/14  Yes Historical Provider, MD  triamcinolone (NASACORT) 55 MCG/ACT AERO nasal inhaler Place 2 sprays into the nose daily as needed (for seasonal allergies).    Yes Historical Provider, MD  zolpidem (AMBIEN) 10 MG tablet Take 5 mg by mouth at bedtime.    Yes Historical Provider, MD   BP 169/63 mmHg  Pulse 82  Temp(Src) 97.6 F (36.4 C) (Oral)  Resp 16  Ht 5\' 6"  (1.676 m)  Wt 158 lb 15.2 oz (72.1 kg)  BMI 25.67 kg/m2  SpO2 96% Physical Exam  Constitutional: She is oriented to person, place, and time. She appears well-developed and well-nourished. No distress.  HENT:  Head: Normocephalic and atraumatic.  Mouth/Throat: Oropharynx is clear and moist.  Eyes: EOM are normal. Pupils are equal, round, and reactive to light.  Neck: Normal range of motion. Neck supple.  Cardiovascular: Normal rate and regular rhythm.   Pulmonary/Chest: Effort normal and breath sounds normal. No respiratory distress. She has no wheezes. She has no rales.  Abdominal: Soft. Bowel sounds are normal. She exhibits no distension and no mass. There is tenderness (mild diffuse tenderness without any focality.). There is no rebound and no guarding.  Musculoskeletal: Normal range of motion. She exhibits no edema or tenderness.  Orthopedic boot on left ankle in place. Good cap refill distally.  Neurological: She is  alert and oriented to person, place, and time.  Moves all extremities without deficit. Sensation is fully intact.  Skin: Skin is warm and dry. No rash noted. No erythema.  Psychiatric: She has a normal mood and affect. Her behavior is normal.  Nursing note and vitals reviewed.   ED Course  Procedures (including critical care time) Labs Review Labs Reviewed  CBC WITH DIFFERENTIAL/PLATELET - Abnormal; Notable for the following:    Hemoglobin 11.9 (*)    All other components within normal limits  COMPREHENSIVE METABOLIC PANEL - Abnormal; Notable for the following:    Potassium 2.7 (*)    Chloride 97 (*)    Glucose, Bld 112 (*)    All other components within normal limits  LIPASE, BLOOD - Abnormal; Notable for the following:    Lipase 15 (*)    All other components within normal limits  URINALYSIS, ROUTINE W REFLEX MICROSCOPIC - Abnormal; Notable for the following:    Specific Gravity, Urine 1.034 (*)    Leukocytes, UA TRACE (*)    All  other components within normal limits  CLOSTRIDIUM DIFFICILE BY PCR  URINE MICROSCOPIC-ADD ON  GI PATHOGEN PANEL BY PCR, STOOL  CBC  BASIC METABOLIC PANEL    Imaging Review Ct Abdomen Pelvis W Contrast  10/31/2014   CLINICAL DATA:  Acute onset of diarrhea.  Initial encounter.  EXAM: CT ABDOMEN AND PELVIS WITH CONTRAST  TECHNIQUE: Multidetector CT imaging of the abdomen and pelvis was performed using the standard protocol following bolus administration of intravenous contrast.  CONTRAST:  159mL OMNIPAQUE IOHEXOL 300 MG/ML  SOLN  COMPARISON:  PET/CT performed 09/19/2003  FINDINGS: The visualized lung bases are clear.  The liver and spleen are unremarkable in appearance. The gallbladder is within normal limits. The pancreas and adrenal glands are unremarkable.  The kidneys are unremarkable in appearance. There is no evidence of hydronephrosis. No renal or ureteral stones are seen. No perinephric stranding is appreciated.  No free fluid is identified. The  small bowel is unremarkable in appearance. The stomach is within normal limits. No acute vascular abnormalities are seen. Scattered calcification is noted along the abdominal aorta and its branches.  The appendix is not definitely seen; there is no evidence for appendicitis.  Diffuse wall thickening is noted along the sigmoid colon, particularly along the distal sigmoid colon and rectum, concerning for acute infectious or inflammatory colitis. There is no evidence for ischemia. The degree of wall thickening along the rectum is unusual, and an underlying mass cannot be excluded. Mild surrounding soft tissue inflammation is seen.  Scattered diverticulosis is noted along the mid sigmoid colon, without evidence of diverticulitis.  The bladder is mildly distended and grossly unremarkable. The patient is status post hysterectomy. No suspicious adnexal masses are seen. The ovaries are relatively symmetric. No inguinal lymphadenopathy is seen.  No acute osseous abnormalities are identified. Multilevel vacuum phenomenon and sclerotic change are noted along the lumbar spine, with underlying facet disease. There is mild grade 1 anterolisthesis of L4 on L5, reflecting facet disease.  IMPRESSION: 1. Acute infectious or inflammatory colitis involving the sigmoid colon, particularly prominent along the distal sigmoid colon and rectum. The degree of wall thickening at the rectum is unusually prominent, and an underlying mass cannot be excluded. Would perform sigmoidoscopy after completion of treatment for colitis. 2. Scattered diverticulosis along the mid sigmoid colon, without evidence of diverticulitis. 3. Scattered calcification along the abdominal aorta and its branches. 4. Mild degenerative change along the lumbar spine.   Electronically Signed   By: Garald Balding M.D.   On: 10/31/2014 02:16     EKG Interpretation None      MDM   Final diagnoses:  Abdominal pain  Colitis  Hypokalemia   Discussed with [facility]  hospitalist and will admit.     Julianne Rice, MD 10/31/14 [phone]

## 2014-10-31 ENCOUNTER — Emergency Department (HOSPITAL_COMMUNITY): Payer: Medicare Other

## 2014-10-31 ENCOUNTER — Encounter (HOSPITAL_COMMUNITY): Payer: Self-pay

## 2014-10-31 DIAGNOSIS — Z7982 Long term (current) use of aspirin: Secondary | ICD-10-CM | POA: Diagnosis not present

## 2014-10-31 DIAGNOSIS — A09 Infectious gastroenteritis and colitis, unspecified: Secondary | ICD-10-CM | POA: Diagnosis not present

## 2014-10-31 DIAGNOSIS — M797 Fibromyalgia: Secondary | ICD-10-CM | POA: Diagnosis present

## 2014-10-31 DIAGNOSIS — E119 Type 2 diabetes mellitus without complications: Secondary | ICD-10-CM | POA: Diagnosis not present

## 2014-10-31 DIAGNOSIS — K529 Noninfective gastroenteritis and colitis, unspecified: Secondary | ICD-10-CM | POA: Diagnosis not present

## 2014-10-31 DIAGNOSIS — Z79899 Other long term (current) drug therapy: Secondary | ICD-10-CM | POA: Diagnosis not present

## 2014-10-31 DIAGNOSIS — K219 Gastro-esophageal reflux disease without esophagitis: Secondary | ICD-10-CM | POA: Diagnosis present

## 2014-10-31 DIAGNOSIS — E785 Hyperlipidemia, unspecified: Secondary | ICD-10-CM | POA: Diagnosis present

## 2014-10-31 DIAGNOSIS — R197 Diarrhea, unspecified: Secondary | ICD-10-CM | POA: Diagnosis not present

## 2014-10-31 DIAGNOSIS — N3289 Other specified disorders of bladder: Secondary | ICD-10-CM | POA: Diagnosis not present

## 2014-10-31 DIAGNOSIS — E876 Hypokalemia: Secondary | ICD-10-CM

## 2014-10-31 DIAGNOSIS — Z90710 Acquired absence of both cervix and uterus: Secondary | ICD-10-CM | POA: Diagnosis not present

## 2014-10-31 DIAGNOSIS — D649 Anemia, unspecified: Secondary | ICD-10-CM | POA: Diagnosis present

## 2014-10-31 DIAGNOSIS — R109 Unspecified abdominal pain: Secondary | ICD-10-CM | POA: Diagnosis not present

## 2014-10-31 DIAGNOSIS — K5730 Diverticulosis of large intestine without perforation or abscess without bleeding: Secondary | ICD-10-CM | POA: Diagnosis not present

## 2014-10-31 DIAGNOSIS — I10 Essential (primary) hypertension: Secondary | ICD-10-CM | POA: Diagnosis present

## 2014-10-31 LAB — URINALYSIS, ROUTINE W REFLEX MICROSCOPIC
Bilirubin Urine: NEGATIVE
Glucose, UA: NEGATIVE mg/dL
Hgb urine dipstick: NEGATIVE
Ketones, ur: NEGATIVE mg/dL
Nitrite: NEGATIVE
Protein, ur: NEGATIVE mg/dL
Specific Gravity, Urine: 1.034 — ABNORMAL HIGH (ref 1.005–1.030)
Urobilinogen, UA: 1 mg/dL (ref 0.0–1.0)
pH: 7 (ref 5.0–8.0)

## 2014-10-31 LAB — CBC WITH DIFFERENTIAL/PLATELET
Basophils Absolute: 0 10*3/uL (ref 0.0–0.1)
Basophils Relative: 0 % (ref 0–1)
Eosinophils Absolute: 0.1 10*3/uL (ref 0.0–0.7)
Eosinophils Relative: 1 % (ref 0–5)
HCT: 37.7 % (ref 36.0–46.0)
Hemoglobin: 11.9 g/dL — ABNORMAL LOW (ref 12.0–15.0)
Lymphocytes Relative: 17 % (ref 12–46)
Lymphs Abs: 1.7 10*3/uL (ref 0.7–4.0)
MCH: 27.5 pg (ref 26.0–34.0)
MCHC: 31.6 g/dL (ref 30.0–36.0)
MCV: 87.3 fL (ref 78.0–100.0)
Monocytes Absolute: 1 10*3/uL (ref 0.1–1.0)
Monocytes Relative: 10 % (ref 3–12)
Neutro Abs: 7.1 10*3/uL (ref 1.7–7.7)
Neutrophils Relative %: 72 % (ref 43–77)
Platelets: 385 10*3/uL (ref 150–400)
RBC: 4.32 MIL/uL (ref 3.87–5.11)
RDW: 14.4 % (ref 11.5–15.5)
WBC: 9.9 10*3/uL (ref 4.0–10.5)

## 2014-10-31 LAB — BASIC METABOLIC PANEL
Anion gap: 10 (ref 5–15)
BUN: 11 mg/dL (ref 6–20)
CO2: 28 mmol/L (ref 22–32)
Calcium: 8.6 mg/dL — ABNORMAL LOW (ref 8.9–10.3)
Chloride: 100 mmol/L — ABNORMAL LOW (ref 101–111)
Creatinine, Ser: 0.69 mg/dL (ref 0.44–1.00)
GFR calc Af Amer: >60 mL/min (ref >60)
GFR calc non Af Amer: >60 mL/min (ref >60)
Glucose, Bld: 127 mg/dL — ABNORMAL HIGH (ref 65–99)
Potassium: 3.2 mmol/L — ABNORMAL LOW (ref 3.5–5.1)
Sodium: 138 mmol/L (ref 135–145)

## 2014-10-31 LAB — CBC
HCT: 33.8 % — ABNORMAL LOW (ref 36.0–46.0)
Hemoglobin: 10.5 g/dL — ABNORMAL LOW (ref 12.0–15.0)
MCH: 27.1 pg (ref 26.0–34.0)
MCHC: 31.1 g/dL (ref 30.0–36.0)
MCV: 87.3 fL (ref 78.0–100.0)
Platelets: 325 10*3/uL (ref 150–400)
RBC: 3.87 MIL/uL (ref 3.87–5.11)
RDW: 14.6 % (ref 11.5–15.5)
WBC: 10.4 10*3/uL (ref 4.0–10.5)

## 2014-10-31 LAB — COMPREHENSIVE METABOLIC PANEL
ALT: 17 U/L (ref 14–54)
AST: 27 U/L (ref 15–41)
Albumin: 3.8 g/dL (ref 3.5–5.0)
Alkaline Phosphatase: 69 U/L (ref 38–126)
Anion gap: 13 (ref 5–15)
BUN: 16 mg/dL (ref 6–20)
CO2: 29 mmol/L (ref 22–32)
Calcium: 9.3 mg/dL (ref 8.9–10.3)
Chloride: 97 mmol/L — ABNORMAL LOW (ref 101–111)
Creatinine, Ser: 0.83 mg/dL (ref 0.44–1.00)
GFR calc Af Amer: >60 mL/min (ref >60)
GFR calc non Af Amer: >60 mL/min (ref >60)
Glucose, Bld: 112 mg/dL — ABNORMAL HIGH (ref 65–99)
Potassium: 2.7 mmol/L — CL (ref 3.5–5.1)
Sodium: 139 mmol/L (ref 135–145)
Total Bilirubin: 0.8 mg/dL (ref 0.3–1.2)
Total Protein: 6.8 g/dL (ref 6.5–8.1)

## 2014-10-31 LAB — GLUCOSE, CAPILLARY
Glucose-Capillary: 90 mg/dL (ref 65–99)
Glucose-Capillary: 96 mg/dL (ref 65–99)
Glucose-Capillary: 101 mg/dL — ABNORMAL HIGH (ref 65–99)
Glucose-Capillary: 120 mg/dL — ABNORMAL HIGH (ref 65–99)

## 2014-10-31 LAB — URINE MICROSCOPIC-ADD ON

## 2014-10-31 LAB — LIPASE, BLOOD: Lipase: 15 U/L — ABNORMAL LOW (ref 22–51)

## 2014-10-31 LAB — CLOSTRIDIUM DIFFICILE BY PCR: Toxigenic C. Difficile by PCR: NEGATIVE

## 2014-10-31 MED ORDER — BISOPROLOL-HYDROCHLOROTHIAZIDE 5-6.25 MG PO TABS
1.0000 | ORAL_TABLET | Freq: Every day | ORAL | Status: DC
  Administered 2014-10-31 – 2014-11-01 (×2): 1 via ORAL
  Filled 2014-10-31 (×3): qty 1

## 2014-10-31 MED ORDER — TRIAMCINOLONE ACETONIDE 55 MCG/ACT NA AERO: 2.0000 | INHALATION_SPRAY | Freq: Every day | NASAL | Status: DC | PRN

## 2014-10-31 MED ORDER — CIPROFLOXACIN IN D5W 400 MG/200ML IV SOLN
400.0000 mg | Freq: Once | INTRAVENOUS | Status: AC
  Administered 2014-10-31: 400 mg via INTRAVENOUS
  Filled 2014-10-31: qty 200

## 2014-10-31 MED ORDER — FLUTICASONE PROPIONATE 50 MCG/ACT NA SUSP: 2.0000 | Freq: Every day | NASAL | Status: DC | PRN

## 2014-10-31 MED ORDER — HEPARIN SODIUM (PORCINE) 5000 UNIT/ML IJ SOLN
5000.0000 [IU] | Freq: Three times a day (TID) | INTRAMUSCULAR | Status: DC
  Administered 2014-10-31 – 2014-11-01 (×4): 5000 [IU] via SUBCUTANEOUS
  Filled 2014-10-31 (×7): qty 1

## 2014-10-31 MED ORDER — HYDROCODONE-ACETAMINOPHEN 10-325 MG PO TABS
1.0000 | ORAL_TABLET | ORAL | Status: DC | PRN
  Administered 2014-10-31: 1 via ORAL
  Filled 2014-10-31: qty 1

## 2014-10-31 MED ORDER — INSULIN ASPART 100 UNIT/ML ~~LOC~~ SOLN: 0.0000 [IU] | Freq: Three times a day (TID) | SUBCUTANEOUS | Status: DC

## 2014-10-31 MED ORDER — SODIUM CHLORIDE 0.9 % IV BOLUS (SEPSIS)
500.0000 mL | Freq: Once | INTRAVENOUS | Status: AC
  Administered 2014-10-31: 500 mL via INTRAVENOUS

## 2014-10-31 MED ORDER — ADULT MULTIVITAMIN W/MINERALS CH
1.0000 | ORAL_TABLET | Freq: Every day | ORAL | Status: DC
  Administered 2014-10-31 – 2014-11-01 (×2): 1 via ORAL
  Filled 2014-10-31 (×2): qty 1

## 2014-10-31 MED ORDER — HYDROCODONE-ACETAMINOPHEN 10-325 MG PO TABS
1.0000 | ORAL_TABLET | Freq: Four times a day (QID) | ORAL | Status: DC | PRN
  Administered 2014-10-31: 1 via ORAL
  Filled 2014-10-31: qty 1

## 2014-10-31 MED ORDER — ALPRAZOLAM 1 MG PO TABS
1.0000 mg | ORAL_TABLET | Freq: Two times a day (BID) | ORAL | Status: DC | PRN
  Administered 2014-10-31 – 2014-11-01 (×3): 1 mg via ORAL
  Filled 2014-10-31 (×3): qty 2

## 2014-10-31 MED ORDER — METRONIDAZOLE IN NACL 5-0.79 MG/ML-% IV SOLN
500.0000 mg | Freq: Three times a day (TID) | INTRAVENOUS | Status: DC
  Administered 2014-10-31 – 2014-11-01 (×4): 500 mg via INTRAVENOUS
  Filled 2014-10-31 (×5): qty 100

## 2014-10-31 MED ORDER — CETYLPYRIDINIUM CHLORIDE 0.05 % MT LIQD
7.0000 mL | Freq: Two times a day (BID) | OROMUCOSAL | Status: DC
  Administered 2014-10-31 – 2014-11-01 (×3): 7 mL via OROMUCOSAL

## 2014-10-31 MED ORDER — PANTOPRAZOLE SODIUM 40 MG PO TBEC
40.0000 mg | DELAYED_RELEASE_TABLET | Freq: Every day | ORAL | Status: DC
  Administered 2014-10-31 – 2014-11-01 (×2): 40 mg via ORAL
  Filled 2014-10-31 (×2): qty 1

## 2014-10-31 MED ORDER — POTASSIUM CHLORIDE CRYS ER 20 MEQ PO TBCR
40.0000 meq | EXTENDED_RELEASE_TABLET | Freq: Once | ORAL | Status: AC
  Administered 2014-10-31: 40 meq via ORAL
  Filled 2014-10-31: qty 2

## 2014-10-31 MED ORDER — METRONIDAZOLE IN NACL 5-0.79 MG/ML-% IV SOLN
500.0000 mg | Freq: Once | INTRAVENOUS | Status: AC
  Administered 2014-10-31: 500 mg via INTRAVENOUS
  Filled 2014-10-31: qty 100

## 2014-10-31 MED ORDER — SODIUM CHLORIDE 0.9 % IV SOLN
INTRAVENOUS | Status: DC
  Administered 2014-10-31: 1000 mL via INTRAVENOUS

## 2014-10-31 MED ORDER — ZOLPIDEM TARTRATE 5 MG PO TABS
5.0000 mg | ORAL_TABLET | Freq: Every day | ORAL | Status: DC
  Administered 2014-10-31: 5 mg via ORAL
  Filled 2014-10-31: qty 1

## 2014-10-31 MED ORDER — GABAPENTIN 300 MG PO CAPS
300.0000 mg | ORAL_CAPSULE | Freq: Two times a day (BID) | ORAL | Status: DC | PRN
  Filled 2014-10-31: qty 1

## 2014-10-31 MED ORDER — GLUCERNA SHAKE PO LIQD
237.0000 mL | Freq: Three times a day (TID) | ORAL | Status: DC
  Administered 2014-10-31: 237 mL via ORAL
  Filled 2014-10-31 (×5): qty 237

## 2014-10-31 MED ORDER — ASPIRIN 81 MG PO CHEW
81.0000 mg | CHEWABLE_TABLET | Freq: Every day | ORAL | Status: DC
  Administered 2014-10-31 – 2014-11-01 (×2): 81 mg via ORAL
  Filled 2014-10-31 (×2): qty 1

## 2014-10-31 MED ORDER — CIPROFLOXACIN IN D5W 400 MG/200ML IV SOLN
400.0000 mg | Freq: Two times a day (BID) | INTRAVENOUS | Status: DC
  Administered 2014-10-31 – 2014-11-01 (×2): 400 mg via INTRAVENOUS
  Filled 2014-10-31 (×3): qty 200

## 2014-10-31 MED ORDER — IOHEXOL 300 MG/ML  SOLN
100.0000 mL | Freq: Once | INTRAMUSCULAR | Status: AC | PRN
  Administered 2014-10-31: 100 mL via INTRAVENOUS

## 2014-10-31 MED ORDER — FLUOXETINE HCL 20 MG PO CAPS
40.0000 mg | ORAL_CAPSULE | Freq: Every day | ORAL | Status: DC
  Administered 2014-10-31 – 2014-11-01 (×2): 40 mg via ORAL
  Filled 2014-10-31 (×2): qty 2

## 2014-10-31 MED ORDER — MONTELUKAST SODIUM 10 MG PO TABS
10.0000 mg | ORAL_TABLET | Freq: Every day | ORAL | Status: DC
  Administered 2014-10-31 – 2014-11-01 (×2): 10 mg via ORAL
  Filled 2014-10-31 (×2): qty 1

## 2014-10-31 MED ORDER — POTASSIUM CHLORIDE 10 MEQ/100ML IV SOLN
10.0000 meq | INTRAVENOUS | Status: AC
  Administered 2014-10-31 (×2): 10 meq via INTRAVENOUS
  Filled 2014-10-31 (×2): qty 100

## 2014-10-31 MED ORDER — CELECOXIB 200 MG PO CAPS
200.0000 mg | ORAL_CAPSULE | Freq: Every day | ORAL | Status: DC | PRN
  Filled 2014-10-31: qty 1

## 2014-10-31 MED ORDER — ACETAMINOPHEN 325 MG PO TABS: 650.0000 mg | ORAL_TABLET | Freq: Four times a day (QID) | ORAL | Status: DC | PRN

## 2014-10-31 MED ORDER — ATORVASTATIN CALCIUM 20 MG PO TABS
20.0000 mg | ORAL_TABLET | Freq: Every day | ORAL | Status: DC
  Administered 2014-10-31: 20 mg via ORAL
  Filled 2014-10-31 (×2): qty 1

## 2014-10-31 MED ORDER — IOHEXOL 300 MG/ML  SOLN
25.0000 mL | Freq: Once | INTRAMUSCULAR | Status: AC | PRN
Start: 2014-10-31 — End: 2014-10-31
  Administered 2014-10-31: 25 mL via ORAL

## 2014-10-31 MED ORDER — METFORMIN HCL ER 500 MG PO TB24: 500.0000 mg | ORAL_TABLET | Freq: Every day | ORAL | Status: DC

## 2014-10-31 NOTE — Progress Notes (Signed)
ANTIBIOTIC CONSULT NOTE - INITIAL  Pharmacy Consult for Cipro Indication: Intra-abdominal infection  Allergies  Allergen Reactions  . Effexor [Venlafaxine] Other (See Comments)    REACTION:  unknown  . Nortriptyline Other (See Comments)    REACTION:  unknown  . Paxil [Paroxetine Hcl] Other (See Comments)    REACTION:  unknown  . Robaxin [Methocarbamol] Itching  . Tizanidine Itching  . Zoloft [Sertraline Hcl] Other (See Comments)    REACTION: unknown    Patient Measurements: Height: 5\' 6"  (167.6 cm) Weight: 158 lb 15.2 oz (72.1 kg) IBW/kg (Calculated) : 59.3   Vital Signs: Temp: 97.6 F (36.4 C) (05/20 0352) Temp Source: Oral (05/20 0352) BP: 169/63 mmHg (05/20 0352) Pulse Rate: 82 (05/20 0352) Intake/Output from previous day:   Intake/Output from this shift:    Labs:  Recent Labs  10/30/14 2330  WBC 9.9  HGB 11.9*  PLT 385  CREATININE 0.83   Estimated Creatinine Clearance: 58.6 mL/min (by C-G formula based on Cr of 0.83). No results for input(s): VANCOTROUGH, VANCOPEAK, VANCORANDOM, GENTTROUGH, GENTPEAK, GENTRANDOM, TOBRATROUGH, TOBRAPEAK, TOBRARND, AMIKACINPEAK, AMIKACINTROU, AMIKACIN in the last 72 hours.   Microbiology: Recent Results (from the past 720 hour(s))  Clostridium Difficile by PCR     Status: None   Collection Time: 10/30/14 11:44 PM  Result Value Ref Range Status   C difficile by pcr NEGATIVE NEGATIVE Final    Medical History: Past Medical History  Diagnosis Date  . Hyperlipidemia   . Hypertension   . Arthritis   . GERD (gastroesophageal reflux disease)   . Vitamin D deficiency   . IBS (irritable bowel syndrome)   . Prediabetes   . Large cell lymphoma   . Diabetes mellitus without complication   . Depression   . Anxiety   . Headache   . Fibromyalgia     Medications:  Prescriptions prior to admission  Medication Sig Dispense Refill Last Dose  . ALPRAZolam (XANAX) 1 MG tablet Take 1 mg by mouth 2 (two) times daily as needed  for anxiety.   10/29/2014 at Unknown time  . aspirin 81 MG chewable tablet Chew by mouth daily.   10/29/2014 at Unknown time  . atorvastatin (LIPITOR) 80 MG tablet TAKE 1 TABLET BY MOUTH DAILY FOR CHLOESTEROL. DISCONTINUE PRAVASTATIN (Patient taking differently: TAKE 1/3 TABLET BY MOUTH DAILY FOR CHOLESTEROL. DISCONTINUE PRAVASTATIN) 30 tablet 3 10/29/2014 at Unknown time  . bisoprolol-hydrochlorothiazide (ZIAC) 5-6.25 MG per tablet TAKE 1 TABLET BY MOUTH DAILY 90 tablet 1 10/29/2014 at 1200  . celecoxib (CELEBREX) 200 MG capsule Take 200 mg by mouth daily as needed for mild pain.   Past Week at Unknown time  . Cholecalciferol (VITAMIN D PO) Take 5,000-10,000 Units by mouth daily. Takes 5,000 units daily, except takes 10,000 units on Mondays, Wednesdays, and Fridays.   10/29/2014 at Unknown time  . esomeprazole (NEXIUM) 20 MG capsule Take 20 mg by mouth 2 (two) times daily before a meal.    10/29/2014 at Unknown time  . FLUoxetine (PROZAC) 40 MG capsule TAKE 1 CAPSULE EVERY DAY FOR MOOD (Patient taking differently: Take 40 mg by mouth daily. ) 90 capsule 3 10/29/2014 at Unknown time  . gabapentin (NEURONTIN) 300 MG capsule Take 300 mg by mouth 2 (two) times daily as needed (pain).   3 10/29/2014 at Unknown time  . HYDROcodone-acetaminophen (NORCO) 10-325 MG per tablet Take 1 tablet by mouth every 4 (four) hours as needed for moderate pain.    10/30/2014 at Unknown time  .  metFORMIN (GLUCOPHAGE XR) 500 MG 24 hr tablet Take 1 tablet (500 mg total) by mouth daily with breakfast. 90 tablet 0 10/29/2014 at Unknown time  . montelukast (SINGULAIR) 10 MG tablet TAKE 1 TABLET EVERY DAY (Patient taking differently: TAKE 1 TABLET EVERY DAY AS NEEDED FOR ALLERGIES) 90 tablet 1 Past Week at Unknown time  . Multiple Vitamin (MULTIVITAMIN) tablet Take 1 tablet by mouth daily.   10/29/2014 at Unknown time  . rizatriptan (MAXALT) 10 MG tablet Take 10 mg by mouth daily as needed for migraine.   1 Past Month at Unknown time  .  triamcinolone (NASACORT) 55 MCG/ACT AERO nasal inhaler Place 2 sprays into the nose daily as needed (for seasonal allergies).    10/30/2014 at Unknown time  . zolpidem (AMBIEN) 10 MG tablet Take 5 mg by mouth at bedtime.    10/29/2014 at Unknown time   Scheduled:  . aspirin  81 mg Oral Daily  . atorvastatin  20 mg Oral q1800  . ciprofloxacin  400 mg Intravenous Once  . ciprofloxacin  400 mg Intravenous Q12H  . FLUoxetine  40 mg Oral Daily  . heparin  5,000 Units Subcutaneous 3 times per day  . insulin aspart  0-9 Units Subcutaneous TID WC  . metronidazole  500 mg Intravenous Once  . metronidazole  500 mg Intravenous Q8H  . montelukast  10 mg Oral Daily  . multivitamin with minerals  1 tablet Oral Daily  . pantoprazole  40 mg Oral Daily  . potassium chloride  40 mEq Oral Once   Infusions:  . sodium chloride     Assessment: 54 yoF s/p recent hospitalization for ORIF of left ankle.  Cipro per Rx for intra-abdominal.   Goal of Therapy:  Treat infection  Plan:   Cipro 400mg  IV q12h  F/u SCr/cultures  Lawana Pai R 10/31/2014,4:50 AM

## 2014-10-31 NOTE — Progress Notes (Signed)
Initial Nutrition Assessment  DOCUMENTATION CODES:  Not applicable  INTERVENTION:  Glucerna shake (TID)  Provided brief vegetarian diet education Encouraged PO intake RD to continue to monitor  NUTRITION DIAGNOSIS:  Increased nutrient needs related to wound healing as evidenced by estimated needs.  GOAL:  Patient will meet greater than or equal to 90% of their needs  MONITOR:  PO intake, Supplement acceptance, Labs, Weight trends, Skin, I & O's  REASON FOR ASSESSMENT:  Malnutrition Screening Tool    ASSESSMENT: 74 y.o. female recently hospitalized for ORIF of left ankle. She was just discharged from her rehab facility to home last week. Presents to ED for ongoing severe diarrhea.  Pt in room with soon at bedside. Pt reports not eating meat for years but does consume fish and dairy. Discussed vegetarian protein sources with patient.  Pt reports weight loss but she is trying to lose weight with portion control. Pt has lost 6 lb in 2 weeks.  Pt did not eat breakfast this morning but encouraged pt to order lunch tray. Pt would like a nutritional supplement to aid in healing after her ankle surgery in April. RD to order Glucerna shakes.  Nutrition focused physical exam shows no sign of depletion of muscle mass or body fat.  Height:  Ht Readings from Last 1 Encounters:  10/31/14 5\' 6"  (1.676 m)    Weight:  Wt Readings from Last 1 Encounters:  10/31/14 158 lb 15.2 oz (72.1 kg)    Ideal Body Weight:  59.1 kg  Wt Readings from Last 10 Encounters:  10/31/14 158 lb 15.2 oz (72.1 kg)  10/16/14 164 lb (74.39 kg)  10/02/14 164 lb 14.4 oz (74.798 kg)  09/26/14 168 lb (76.204 kg)  06/26/14 174 lb (78.926 kg)  05/07/14 169 lb (76.658 kg)  05/01/14 171 lb 3.2 oz (77.656 kg)  03/24/14 173 lb (78.472 kg)  10/09/13 169 lb 6.4 oz (76.839 kg)  07/17/13 173 lb (78.472 kg)    BMI:  Body mass index is 25.67 kg/(m^2).  Estimated Nutritional Needs:  Kcal:   1800-2000  Protein:  85-95g  Fluid:  1.8L/day   Skin:  Wound (see comment) (ankle incision)  Diet Order:  Diet Carb Modified Fluid consistency:: Thin; Room service appropriate?: Yes  EDUCATION NEEDS:  Education needs addressed   Intake/Output Summary (Last 24 hours) at 10/31/14 1202 Last data filed at 10/31/14 0800  Gross per 24 hour  Intake 1125.42 ml  Output    600 ml  Net 525.42 ml    Last BM:  5/20  Clayton Bibles, MS, RD, LDN Pager: [phone] After Hours Pager: [phone]

## 2014-10-31 NOTE — Progress Notes (Signed)
Patient admitted to room 1329 from ED with c/o diarrhea. From home alone. C diff negative that was collected on 5/19. Awaiting another sample for GI Pathogen panel. Placed on Contact precautions per protocol. Oriented to room and unit. LLE in leg brace-patient "unsure" what Dr. [name] told her about "leaving it on or taking it off". Been using a wheelchair for mobility at home. Wants to use bedpan here.

## 2014-10-31 NOTE — Progress Notes (Addendum)
PROGRESS NOTE    Debra Short XNT:[id number] DOB: 10/06/1940 DOA: 10/30/2014 PCP: [name], MD  HPI/Brief narrative 74 y.o. female with h/o HTN, DM, IBS, recently hospitalized for ORIF of left ankle. She was just discharged from her rehab facility to home last week. Today she woke up not feeling well. Onset of stomach cramps around 3 PM. Developed multiple episodes of diarrhea with incontinence. Does have nausea but no vomiting. No fever. Presents to ED for ongoing severe diarrhea. CT Abd: sigmoid colitis, cannot exclude mass. C Diff PCR neg.   Assessment/Plan:  Sigmoid colitis-suspected infectious. - Other DD: Inflammatory, ischemic - C. difficile PCR negative - Follow GI pathogen panel PCR - Continue empiric treatment with IV Cipro and Flagyl - Clinically improving - Patient states she had colonoscopy 3 years ago by Dr. [name] which was negative and is supposed to repeat it 2 years from now. Outpatient follow-up with GI to consider earlier colonoscopy after acute phase has resolved  Hypokalemia - Replace and follow  DM 2 - Hold metformin secondary to recent contrast - NovoLog SSI  Essential hypertension - Mildly uncontrolled - Resume bisoprolol  Anemia - Follow CBCs  Status post recent left ankle ORIF - As per patient, nonweightbearing left lower extremity - Sees Dr. [name]   Code Status: Full Family Communication: None at bedside Disposition Plan: DC home possibly in 24/48 hours   Consultants:  None  Procedures:  None  Antibiotics:  IV Cipro  IV Flagyl   Subjective: Feels much better. Diarrhea has significantly improved and has not had any since arrival from ED. Abdominal pain resolved.   Objective: Filed Vitals:   10/31/14 0243 10/31/14 0350 10/31/14 0352 10/31/14 1340  BP: 164/60  169/63 118/50  Pulse: 85 86 82 74  Temp:   97.6 F (36.4 C) 98.4 F (36.9 C)  TempSrc:   Oral Oral  Resp: 18  16 16   Height:    5\' 6"  (1.676 m)   Weight:   72.1 kg (158 lb 15.2 oz)   SpO2: 94%  96% 98%    Intake/Output Summary (Last 24 hours) at 10/31/14 1827 Last data filed at 10/31/14 1400  Gross per 24 hour  Intake 1840.42 ml  Output    600 ml  Net 1240.42 ml   Filed Weights   10/31/14 0352  Weight: 72.1 kg (158 lb 15.2 oz)     Exam:  General exam: Pleasant elderly female lying comfortably in bed. Respiratory system: Clear. No increased work of breathing. Cardiovascular system: S1 & S2 heard, RRR. No JVD, murmurs, gallops, clicks or pedal edema. Gastrointestinal system: Abdomen is nondistended, soft. Mild tenderness in the lower quadrants, especially in the left lower quadrant without peritoneal signs. Normal bowel sounds heard. Central nervous system: Alert and oriented. No focal neurological deficits. Extremities: Symmetric 5 x 5 power.   Data Reviewed: Basic Metabolic Panel:  Recent Labs Lab 10/30/14 2330 10/31/14 0615  NA 139 138  K 2.7* 3.2*  CL 97* 100*  CO2 29 28  GLUCOSE 112* 127*  BUN 16 11  CREATININE 0.83 0.69  CALCIUM 9.3 8.6*   Liver Function Tests:  Recent Labs Lab 10/30/14 2330  AST 27  ALT 17  ALKPHOS 69  BILITOT 0.8  PROT 6.8  ALBUMIN 3.8    Recent Labs Lab 10/30/14 2330  LIPASE 15*   No results for input(s): AMMONIA in the last 168 hours. CBC:  Recent Labs Lab 10/30/14 2330 10/31/14 0615  WBC 9.9  10.4  NEUTROABS 7.1  --   HGB 11.9* 10.5*  HCT 37.7 33.8*  MCV 87.3 87.3  PLT 385 325   Cardiac Enzymes: No results for input(s): CKTOTAL, CKMB, CKMBINDEX, TROPONINI in the last 168 hours. BNP (last 3 results) No results for input(s): PROBNP in the last 8760 hours. CBG:  Recent Labs Lab 10/31/14 0941 10/31/14 1142 10/31/14 1659  GLUCAP 90 96 101*    Recent Results (from the past 240 hour(s))  Clostridium Difficile by PCR     Status: None   Collection Time: 10/30/14 11:44 PM  Result Value Ref Range Status   C difficile by pcr NEGATIVE  NEGATIVE Final         Studies: Ct Abdomen Pelvis W Contrast  10/31/2014   CLINICAL DATA:  Acute onset of diarrhea.  Initial encounter.  EXAM: CT ABDOMEN AND PELVIS WITH CONTRAST  TECHNIQUE: Multidetector CT imaging of the abdomen and pelvis was performed using the standard protocol following bolus administration of intravenous contrast.  CONTRAST:  141mL OMNIPAQUE IOHEXOL 300 MG/ML  SOLN  COMPARISON:  PET/CT performed 09/19/2003  FINDINGS: The visualized lung bases are clear.  The liver and spleen are unremarkable in appearance. The gallbladder is within normal limits. The pancreas and adrenal glands are unremarkable.  The kidneys are unremarkable in appearance. There is no evidence of hydronephrosis. No renal or ureteral stones are seen. No perinephric stranding is appreciated.  No free fluid is identified. The small bowel is unremarkable in appearance. The stomach is within normal limits. No acute vascular abnormalities are seen. Scattered calcification is noted along the abdominal aorta and its branches.  The appendix is not definitely seen; there is no evidence for appendicitis.  Diffuse wall thickening is noted along the sigmoid colon, particularly along the distal sigmoid colon and rectum, concerning for acute infectious or inflammatory colitis. There is no evidence for ischemia. The degree of wall thickening along the rectum is unusual, and an underlying mass cannot be excluded. Mild surrounding soft tissue inflammation is seen.  Scattered diverticulosis is noted along the mid sigmoid colon, without evidence of diverticulitis.  The bladder is mildly distended and grossly unremarkable. The patient is status post hysterectomy. No suspicious adnexal masses are seen. The ovaries are relatively symmetric. No inguinal lymphadenopathy is seen.  No acute osseous abnormalities are identified. Multilevel vacuum phenomenon and sclerotic change are noted along the lumbar spine, with underlying facet disease.  There is mild grade 1 anterolisthesis of L4 on L5, reflecting facet disease.  IMPRESSION: 1. Acute infectious or inflammatory colitis involving the sigmoid colon, particularly prominent along the distal sigmoid colon and rectum. The degree of wall thickening at the rectum is unusually prominent, and an underlying mass cannot be excluded. Would perform sigmoidoscopy after completion of treatment for colitis. 2. Scattered diverticulosis along the mid sigmoid colon, without evidence of diverticulitis. 3. Scattered calcification along the abdominal aorta and its branches. 4. Mild degenerative change along the lumbar spine.   Electronically Signed   By: Garald Balding M.D.   On: 10/31/2014 02:16        Scheduled Meds: . antiseptic oral rinse  7 mL Mouth Rinse BID  . aspirin  81 mg Oral Daily  . atorvastatin  20 mg Oral q1800  . ciprofloxacin  400 mg Intravenous Q12H  . feeding supplement (GLUCERNA SHAKE)  237 mL Oral TID BM  . FLUoxetine  40 mg Oral Daily  . heparin  5,000 Units Subcutaneous 3 times per day  . insulin  aspart  0-9 Units Subcutaneous TID WC  . metronidazole  500 mg Intravenous Q8H  . montelukast  10 mg Oral Daily  . multivitamin with minerals  1 tablet Oral Daily  . pantoprazole  40 mg Oral Daily   Continuous Infusions: . sodium chloride 1,000 mL (10/31/14 0455)    Principal Problem:   Colitis presumed infectious Active Problems:   Diabetes mellitus type 2, controlled   Diarrhea   Hypokalemia    Time spent: 25 minutes.    Vernell Leep, MD, FACP, FHM. [facility] Pager [phone]  If 7PM-7AM, please contact night-coverage [email or web address] Password [email or web address] 10/31/2014, 6:27 PM    LOS: 0 days

## 2014-10-31 NOTE — H&P (Signed)
[facility] History and Physical  Debra Short ERX:[id number] DOB: 12-16-1940 DOA: 10/30/2014  Referring physician: EDP PCP: [name], MD   Chief Complaint: Diarrhea   HPI: Debra Short is a 74 y.o. female recently hospitalized for ORIF of left ankle.  She was just discharged from her rehab facility to home last week.  Today she woke up not feeling well.  Onset of stomach cramps around 3 PM.  Developed multiple episodes of diarrhea with incontinence.  Does have nausea but no vomiting.  No fever.  Presents to ED for ongoing severe diarrhea.  Review of Systems: Systems reviewed.  As above, otherwise negative  Past Medical History  Diagnosis Date  . Hyperlipidemia   . Hypertension   . Arthritis   . GERD (gastroesophageal reflux disease)   . Vitamin D deficiency   . IBS (irritable bowel syndrome)   . Prediabetes   . Large cell lymphoma   . Diabetes mellitus without complication   . Depression   . Anxiety   . Headache   . Fibromyalgia    Past Surgical History  Procedure Laterality Date  . Orif ankle fracture Left 09/26/2014  . Abdominal hysterectomy    . Tonsillectomy    . Orif ankle fracture Left 09/26/2014    Procedure: OPEN REDUCTION INTERNAL FIXATION (ORIF) ANKLE FRACTURE;  Surgeon: [name], MD;  Location: [facility];  Service: Orthopedics;  Laterality: Left;   Social History:  reports that she has never smoked. She has never used smokeless tobacco. She reports that she does not drink alcohol or use illicit drugs.  Allergies  Allergen Reactions  . Effexor [Venlafaxine] Other (See Comments)    REACTION:  unknown  . Nortriptyline Other (See Comments)    REACTION:  unknown  . Paxil [Paroxetine Hcl] Other (See Comments)    REACTION:  unknown  . Robaxin [Methocarbamol] Itching  . Tizanidine Itching  . Zoloft [Sertraline Hcl] Other (See Comments)    REACTION: unknown    No family history on file.   Prior to Admission medications   Medication  Sig Start Date End Date Taking? Authorizing Provider  ALPRAZolam Duanne Moron) 1 MG tablet Take 1 mg by mouth 2 (two) times daily as needed for anxiety.   Yes Historical Provider, MD  aspirin 81 MG chewable tablet Chew by mouth daily.   Yes Historical Provider, MD  atorvastatin (LIPITOR) 80 MG tablet TAKE 1 TABLET BY MOUTH DAILY FOR CHLOESTEROL. DISCONTINUE PRAVASTATIN Patient taking differently: TAKE 1/3 TABLET BY MOUTH DAILY FOR CHOLESTEROL. DISCONTINUE PRAVASTATIN   Yes [name], PA-C  bisoprolol-hydrochlorothiazide [facility]) 5-6.25 MG per tablet TAKE 1 TABLET BY MOUTH DAILY 06/29/14  Yes [name], PA-C  celecoxib (CELEBREX) 200 MG capsule Take 200 mg by mouth daily as needed for mild pain.   Yes Historical Provider, MD  Cholecalciferol (VITAMIN D PO) Take 5,000-10,000 Units by mouth daily. Takes 5,000 units daily, except takes 10,000 units on Mondays, Wednesdays, and Fridays.   Yes Historical Provider, MD  esomeprazole (NEXIUM) 20 MG capsule Take 20 mg by mouth 2 (two) times daily before a meal.    Yes Historical Provider, MD  FLUoxetine (PROZAC) 40 MG capsule TAKE 1 CAPSULE EVERY DAY FOR MOOD Patient taking differently: Take 40 mg by mouth daily.  05/01/14  Yes Unk [name], MD  gabapentin (NEURONTIN) 300 MG capsule Take 300 mg by mouth 2 (two) times daily as needed (pain).  03/12/14  Yes Historical Provider, MD  HYDROcodone-acetaminophen (NORCO) 10-325 MG per tablet Take 1  tablet by mouth every 4 (four) hours as needed for moderate pain.    Yes Historical Provider, MD  metFORMIN (GLUCOPHAGE XR) 500 MG 24 hr tablet Take 1 tablet (500 mg total) by mouth daily with breakfast. 06/27/14  Yes [name], PA-C  montelukast (SINGULAIR) 10 MG tablet TAKE 1 TABLET EVERY DAY Patient taking differently: TAKE 1 TABLET EVERY DAY AS NEEDED FOR ALLERGIES 02/24/14  Yes Unk [name], MD  Multiple Vitamin (MULTIVITAMIN) tablet Take 1 tablet by mouth daily.   Yes Historical Provider, MD  rizatriptan  (MAXALT) 10 MG tablet Take 10 mg by mouth daily as needed for migraine.  04/01/14  Yes Historical Provider, MD  triamcinolone (NASACORT) 55 MCG/ACT AERO nasal inhaler Place 2 sprays into the nose daily as needed (for seasonal allergies).    Yes Historical Provider, MD  zolpidem (AMBIEN) 10 MG tablet Take 5 mg by mouth at bedtime.    Yes Historical Provider, MD   Physical Exam: Filed Vitals:   10/31/14 0243  BP: 164/60  Pulse: 85  Temp:   Resp: 18    BP 164/60 mmHg  Pulse 85  Temp(Src) 98.4 F (36.9 C) (Oral)  Resp 18  SpO2 94%  General Appearance:    Alert, oriented, no distress, appears stated age  Head:    Normocephalic, atraumatic  Eyes:    PERRL, EOMI, sclera non-icteric        Nose:   Nares without drainage or epistaxis. Mucosa, turbinates normal  Throat:   Moist mucous membranes. Oropharynx without erythema or exudate.  Neck:   Supple. No carotid bruits.  No thyromegaly.  No lymphadenopathy.   Back:     No CVA tenderness, no spinal tenderness  Lungs:     Clear to auscultation bilaterally, without wheezes, rhonchi or rales  Chest wall:    No tenderness to palpitation  Heart:    Regular rate and rhythm without murmurs, gallops, rubs  Abdomen:     Soft, non-tender, nondistended, normal bowel sounds, no organomegaly  Genitalia:    deferred  Rectal:    deferred  Extremities:   No clubbing, cyanosis or edema.  Pulses:   2+ and symmetric all extremities  Skin:   Skin color, texture, turgor normal, no rashes or lesions  Lymph nodes:   Cervical, supraclavicular, and axillary nodes normal  Neurologic:   CNII-XII intact. Normal strength, sensation and reflexes      throughout    Labs on Admission:  Basic Metabolic Panel:  Recent Labs Lab 10/30/14 2330  NA 139  K 2.7*  CL 97*  CO2 29  GLUCOSE 112*  BUN 16  CREATININE 0.83  CALCIUM 9.3   Liver Function Tests:  Recent Labs Lab 10/30/14 2330  AST 27  ALT 17  ALKPHOS 69  BILITOT 0.8  PROT 6.8  ALBUMIN 3.8     Recent Labs Lab 10/30/14 2330  LIPASE 15*   No results for input(s): AMMONIA in the last 168 hours. CBC:  Recent Labs Lab 10/30/14 2330  WBC 9.9  NEUTROABS 7.1  HGB 11.9*  HCT 37.7  MCV 87.3  PLT 385   Cardiac Enzymes: No results for input(s): CKTOTAL, CKMB, CKMBINDEX, TROPONINI in the last 168 hours.  BNP (last 3 results) No results for input(s): PROBNP in the last 8760 hours. CBG: No results for input(s): GLUCAP in the last 168 hours.  Radiological Exams on Admission: Ct Abdomen Pelvis W Contrast  10/31/2014   CLINICAL DATA:  Acute onset of diarrhea.  Initial encounter.  EXAM: CT ABDOMEN AND PELVIS WITH CONTRAST  TECHNIQUE: Multidetector CT imaging of the abdomen and pelvis was performed using the standard protocol following bolus administration of intravenous contrast.  CONTRAST:  114mL OMNIPAQUE IOHEXOL 300 MG/ML  SOLN  COMPARISON:  PET/CT performed 09/19/2003  FINDINGS: The visualized lung bases are clear.  The liver and spleen are unremarkable in appearance. The gallbladder is within normal limits. The pancreas and adrenal glands are unremarkable.  The kidneys are unremarkable in appearance. There is no evidence of hydronephrosis. No renal or ureteral stones are seen. No perinephric stranding is appreciated.  No free fluid is identified. The small bowel is unremarkable in appearance. The stomach is within normal limits. No acute vascular abnormalities are seen. Scattered calcification is noted along the abdominal aorta and its branches.  The appendix is not definitely seen; there is no evidence for appendicitis.  Diffuse wall thickening is noted along the sigmoid colon, particularly along the distal sigmoid colon and rectum, concerning for acute infectious or inflammatory colitis. There is no evidence for ischemia. The degree of wall thickening along the rectum is unusual, and an underlying mass cannot be excluded. Mild surrounding soft tissue inflammation is seen.  Scattered  diverticulosis is noted along the mid sigmoid colon, without evidence of diverticulitis.  The bladder is mildly distended and grossly unremarkable. The patient is status post hysterectomy. No suspicious adnexal masses are seen. The ovaries are relatively symmetric. No inguinal lymphadenopathy is seen.  No acute osseous abnormalities are identified. Multilevel vacuum phenomenon and sclerotic change are noted along the lumbar spine, with underlying facet disease. There is mild grade 1 anterolisthesis of L4 on L5, reflecting facet disease.  IMPRESSION: 1. Acute infectious or inflammatory colitis involving the sigmoid colon, particularly prominent along the distal sigmoid colon and rectum. The degree of wall thickening at the rectum is unusually prominent, and an underlying mass cannot be excluded. Would perform sigmoidoscopy after completion of treatment for colitis. 2. Scattered diverticulosis along the mid sigmoid colon, without evidence of diverticulitis. 3. Scattered calcification along the abdominal aorta and its branches. 4. Mild degenerative change along the lumbar spine.   Electronically Signed   By: Garald Balding M.D.   On: 10/31/2014 02:16    EKG: Independently reviewed.  Assessment/Plan Principal Problem:   Colitis presumed infectious Active Problems:   Diabetes mellitus type 2, controlled   Diarrhea   Hypokalemia   1. Colitis presumed infectious - 1. Empiric cipro / flagyl 2. C.Diff PCR is negative 3. GI pathogen panel pending 4. IVF 5. norco PRN pain 2. Hypokalemia - 1. replace potassium 2. Repeat BMP in AM 3. DM2 - 1. Hold metformin due to contrast 2. Low dose SSI AC/HS 4. HTN - holding lisinopril / HCTZ    Code Status: Full Code  Family Communication: No family in room Disposition Plan: Admit to obs   Time spent: 50 min  GARDNER, JARED M. [facility] Pager [phone]  If 7AM-7PM, please contact the day team taking care of the patient [email or web address] Password  [email or web address] 10/31/2014, 3:09 AM

## 2014-10-31 NOTE — Plan of Care (Signed)
Problem: Phase I Progression Outcomes Goal: OOB as tolerated unless otherwise ordered Outcome: Not Met (add Reason) Patient has been using wheelchair at home to get around due to recent ORIF. Using bedpan here.

## 2014-11-01 LAB — BASIC METABOLIC PANEL
Anion gap: 8 (ref 5–15)
BUN: 7 mg/dL (ref 6–20)
CO2: 27 mmol/L (ref 22–32)
Calcium: 8.8 mg/dL — ABNORMAL LOW (ref 8.9–10.3)
Chloride: 104 mmol/L (ref 101–111)
Creatinine, Ser: 0.57 mg/dL (ref 0.44–1.00)
GFR calc Af Amer: >60 mL/min (ref >60)
GFR calc non Af Amer: >60 mL/min (ref >60)
Glucose, Bld: 96 mg/dL (ref 65–99)
Potassium: 3.8 mmol/L (ref 3.5–5.1)
Sodium: 139 mmol/L (ref 135–145)

## 2014-11-01 LAB — CBC
HCT: 33.6 % — ABNORMAL LOW (ref 36.0–46.0)
Hemoglobin: 10.1 g/dL — ABNORMAL LOW (ref 12.0–15.0)
MCH: 26.8 pg (ref 26.0–34.0)
MCHC: 30.1 g/dL (ref 30.0–36.0)
MCV: 89.1 fL (ref 78.0–100.0)
Platelets: 276 10*3/uL (ref 150–400)
RBC: 3.77 MIL/uL — ABNORMAL LOW (ref 3.87–5.11)
RDW: 14.5 % (ref 11.5–15.5)
WBC: 5.5 10*3/uL (ref 4.0–10.5)

## 2014-11-01 LAB — GLUCOSE, CAPILLARY
Glucose-Capillary: 96 mg/dL (ref 65–99)
Glucose-Capillary: 109 mg/dL — ABNORMAL HIGH (ref 65–99)

## 2014-11-01 MED ORDER — GLUCERNA SHAKE PO LIQD: 237.0000 mL | Freq: Three times a day (TID) | ORAL | Status: DC

## 2014-11-01 MED ORDER — METFORMIN HCL ER 500 MG PO TB24: 500.0000 mg | ORAL_TABLET | Freq: Every day | ORAL | Status: DC

## 2014-11-01 MED ORDER — CIPROFLOXACIN HCL 500 MG PO TABS: 500.0000 mg | ORAL_TABLET | Freq: Two times a day (BID) | ORAL | Status: DC

## 2014-11-01 MED ORDER — METRONIDAZOLE 500 MG PO TABS: 500.0000 mg | ORAL_TABLET | Freq: Three times a day (TID) | ORAL | Status: DC

## 2014-11-01 MED ORDER — ATORVASTATIN CALCIUM 80 MG PO TABS: ORAL_TABLET | ORAL | Status: DC

## 2014-11-01 NOTE — Care Management Note (Deleted)
Case Management Note  Patient Details  Name: Debra Short MRN: [id number] Date of Birth: 09/17/1940  Subjective/Objective:                    Action/Plan:   Expected Discharge Date:  11/01/14               Expected Discharge Plan:  Skilled Nursing Facility  In-House Referral:  Clinical Social Work, Nutrition  Discharge planning Services  CM Consult  Post Acute Care Choice:    Choice offered to:     DME Arranged:    DME Agency:     HH Arranged:    [place] [facility] Agency:     Status of Service:  Completed, signed off  Medicare Important Message Given:    Date Medicare IM Given:    Medicare IM give by:    Date Additional Medicare IM Given:    Additional Medicare Important Message give by:     If discussed at [place] of Stay Meetings, dates discussed:    Additional Comments: NCM spoke to dtr, Debra Short [phone]. States she is interested in SNF-rehab. CSW referral for SNF placement. Pt lives in home with elderly husband but he is unable to care for her at home.  Erenest Rasher, RN 11/01/2014, 11:22 AM

## 2014-11-01 NOTE — Discharge Instructions (Signed)

## 2014-11-01 NOTE — Care Management Note (Signed)
Case Management Note  Patient Details  Name: Debra Short MRN: [id number] Date of Birth: 1941/05/27  Subjective/Objective:                    Action/Plan:   Expected Discharge Date:  11/01/14               Expected Discharge Plan:  [place]  In-House Referral:  Nutrition  Discharge planning Services  CM Consult  Post Acute Care Choice:  Resumption of Svcs/PTA Provider, Home Health Choice offered to:     DME Arranged:    DME Agency:     HH Arranged:  RN, PT, OT HH Agency:  [facility]  Status of Service:  Completed, signed off  Medicare Important Message Given:    Date Medicare IM Given:    Medicare IM give by:    Date Additional Medicare IM Given:    Additional Medicare Important Message give by:     If discussed at [place] of Stay Meetings, dates discussed:    Additional Comments: NCM spoke to pt and she is active with [facility]. Contacted Haematologist and spoke to [employer], [place]. Pt active with HH RN, PT, OT and SW. Pt states she has RW and bedside commode at home.  Erenest Rasher, RN 11/01/2014, 11:28 AM

## 2014-11-01 NOTE — Progress Notes (Signed)
CSW consulted for transportation home. Pt requested non emergency ambulance. Pt confirmed address and declined csw calling family. Transport arranged with [facility].   Belia Heman, [place]

## 2014-11-01 NOTE — Progress Notes (Signed)
Pt discharged home via [facility]. Discharge instructions and scripts given. Pt verbalized understanding.

## 2014-11-01 NOTE — Discharge Summary (Signed)
Physician Discharge Summary  Debra Short:[id number] DOB: 1940/09/28 DOA: 10/30/2014  PCP: Debra Richards, MD  Admit date: 10/30/2014 Discharge date: 11/01/2014  Time spent: Less than 30 minutes  Recommendations for Outpatient Follow-up:  1. Dr. Unk [name], PCP in 5 days with repeat labs (CBC & BMP). 2. Dr. [name], Orthopedics: Patient advised to keep previous appointment that she has for 11/04/14 3. Dr. [name], GI: Patient advised to make an appointment to follow up in 1 week. 4. Patient is active with home health RN, PT, OT and social worker. She has a rolling walker and bedside commode at home.  Discharge Diagnoses:  Principal Problem:   Colitis presumed infectious Active Problems:   Diabetes mellitus type 2, controlled   Diarrhea   Hypokalemia   Discharge Condition: Improved & Stable  Diet recommendation: Heart healthy and diabetic diet.  Filed Weights   10/31/14 0352  Weight: 72.1 kg (158 lb 15.2 oz)    History of present illness:  74 y.o. female with h/o HTN, DM, IBS, recently hospitalized for ORIF of left ankle. She was just discharged from her rehab facility to home last week. Today she woke up not feeling well. Onset of stomach cramps around 3 PM. Developed multiple episodes of diarrhea with incontinence. Does have nausea but no vomiting. No fever. Presents to ED for ongoing severe diarrhea. CT Abd: sigmoid colitis, cannot exclude mass. C Diff PCR neg.  Hospital Course:   Sigmoid colitis-suspected infectious. - Other DD: Inflammatory, ischemic - C. difficile PCR negative - It appears as though GI pathogen panel PCR sample was never sent. - Treated empirically with IV Cipro and Flagyl and has completed approximately 2 days treatment - Clinically improved. No BM since admission. Abdominal pain has almost resolved. Tolerating regular consistency diet. She will be discharged home on oral Cipro and Flagyl to complete total 10 days  treatment. - Patient states she had colonoscopy 3 years ago by Dr. [name] which was negative and is supposed to repeat it 2 years from now. Outpatient follow-up with GI to consider earlier colonoscopy after acute phase has resolved  Hypokalemia - Replace and follow  DM 2 - Hold metformin secondary to recent contrast and patient advised to resume on 11/02/14  Essential hypertension - controlled - Continue bisoprolol-HCTZ  Anemia -Stable. Follow CBCs outpatient  Status post recent left ankle ORIF - As per patient, nonweightbearing left lower extremity - Sees Dr. [name] and has appointment for 5/24 - Case management consulted to resume home health services.  Consultations:  None   Procedures:  None    Discharge Exam:  Complaints:  Continues to feel much better. No BM since admission. No nausea or vomiting. Tolerating regular consistency diet. Mild intermittent left lower quadrant abdominal pain rated at 2/10 in severity.  Filed Vitals:   10/31/14 1340 10/31/14 2124 11/01/14 0448 11/01/14 1356  BP: 118/50 139/99 130/53 140/65  Pulse: 74 79 72 78  Temp: 98.4 F (36.9 C) 98.6 F (37 C) 97.8 F (36.6 C) 98.1 F (36.7 C)  TempSrc: Oral Oral Oral Oral  Resp: 16 16 12 16   Height:      Weight:      SpO2: 98% 99% 94% 96%    General exam: Pleasant elderly female lying comfortably in bed. Respiratory system: Clear. No increased work of breathing. Cardiovascular system: S1 & S2 heard, RRR. No JVD, murmurs, gallops, clicks or pedal edema. Gastrointestinal system: Abdomen is nondistended, soft and minimal/improved tenderness in left lower  quadrant without peritoneal signs. Normal bowel sounds heard. Central nervous system: Alert and oriented. No focal neurological deficits. Extremities: Symmetric 5 x 5 power.  Discharge Instructions      Discharge Instructions    Call MD for:  extreme fatigue    Complete by:  As directed      Call MD for:  persistant  dizziness or light-headedness    Complete by:  As directed      Call MD for:  persistant nausea and vomiting    Complete by:  As directed      Call MD for:  severe uncontrolled pain    Complete by:  As directed      Call MD for:  temperature >100.4    Complete by:  As directed      Call MD for:    Complete by:  As directed   Worsening diarrhea.     Diet - low sodium heart healthy    Complete by:  As directed      Diet Carb Modified    Complete by:  As directed      Increase activity slowly    Complete by:  As directed             Medication List    TAKE these medications        ALPRAZolam 1 MG tablet  Commonly known as:  XANAX  Take 1 mg by mouth 2 (two) times daily as needed for anxiety.     aspirin 81 MG chewable tablet  Chew by mouth daily.     atorvastatin 80 MG tablet  Commonly known as:  LIPITOR  TAKE 1/3 TABLET BY MOUTH DAILY FOR CHOLESTEROL. DISCONTINUE PRAVASTATIN     bisoprolol-hydrochlorothiazide 5-6.25 MG per tablet  Commonly known as:  ZIAC  TAKE 1 TABLET BY MOUTH DAILY     celecoxib 200 MG capsule  Commonly known as:  CELEBREX  Take 200 mg by mouth daily as needed for mild pain.     ciprofloxacin 500 MG tablet  Commonly known as:  CIPRO  Take 1 tablet (500 mg total) by mouth 2 (two) times daily.     esomeprazole 20 MG capsule  Commonly known as:  NEXIUM  Take 20 mg by mouth 2 (two) times daily before a meal.     feeding supplement (GLUCERNA SHAKE) Liqd  Take 237 mLs by mouth 3 (three) times daily between meals.     FLUoxetine 40 MG capsule  Commonly known as:  PROZAC  TAKE 1 CAPSULE EVERY DAY FOR MOOD     gabapentin 300 MG capsule  Commonly known as:  NEURONTIN  Take 300 mg by mouth 2 (two) times daily as needed (pain).     HYDROcodone-acetaminophen 10-325 MG per tablet  Commonly known as:  NORCO  Take 1 tablet by mouth every 4 (four) hours as needed for moderate pain.     metFORMIN 500 MG 24 hr tablet  Commonly known as:  GLUCOPHAGE  XR  Take 1 tablet (500 mg total) by mouth daily with breakfast. Metformin being held for 48 hours due to contrast you received on 10/31/2014. May start taking it on 11/02/2014     metroNIDAZOLE 500 MG tablet  Commonly known as:  FLAGYL  Take 1 tablet (500 mg total) by mouth 3 (three) times daily.     montelukast 10 MG tablet  Commonly known as:  SINGULAIR  TAKE 1 TABLET EVERY DAY     multivitamin tablet  Take 1  tablet by mouth daily.     rizatriptan 10 MG tablet  Commonly known as:  MAXALT  Take 10 mg by mouth daily as needed for migraine.     triamcinolone 55 MCG/ACT Aero nasal inhaler  Commonly known as:  NASACORT  Place 2 sprays into the nose daily as needed (for seasonal allergies).     VITAMIN D PO  Take 5,000-10,000 Units by mouth daily. Takes 5,000 units daily, except takes 10,000 units on Mondays, Wednesdays, and Fridays.     zolpidem 10 MG tablet  Commonly known as:  AMBIEN  Take 5 mg by mouth at bedtime.       Follow-up Information    Follow up with MCKEOWN,WILLIAM DAVID, MD. Schedule an appointment as soon as possible for a visit in 5 days.   Specialty:  Internal Medicine   Why:  to be seen with repeat labs (CBC & BMP).   Contact information:   [address] [place] [address] [phone]       Follow up with Alta Corning, MD On 11/04/2014.   Specialty:  Orthopedic Surgery   Why:  Keep appointment you have.   Contact information:   [place] [address] [phone]       Follow up with EDWARDS JR,JAMES L, MD. Schedule an appointment as soon as possible for a visit in 1 week.   Specialty:  Gastroenterology   Contact information:   [address] [place] [address] [phone]        The results of significant diagnostics from this hospitalization (including imaging, microbiology, ancillary and laboratory) are listed below for reference.    Significant Diagnostic Studies: Ct Abdomen Pelvis W  Contrast  10/31/2014   CLINICAL DATA:  Acute onset of diarrhea.  Initial encounter.  EXAM: CT ABDOMEN AND PELVIS WITH CONTRAST  TECHNIQUE: Multidetector CT imaging of the abdomen and pelvis was performed using the standard protocol following bolus administration of intravenous contrast.  CONTRAST:  176mL OMNIPAQUE IOHEXOL 300 MG/ML  SOLN  COMPARISON:  PET/CT performed 09/19/2003  FINDINGS: The visualized lung bases are clear.  The liver and spleen are unremarkable in appearance. The gallbladder is within normal limits. The pancreas and adrenal glands are unremarkable.  The kidneys are unremarkable in appearance. There is no evidence of hydronephrosis. No renal or ureteral stones are seen. No perinephric stranding is appreciated.  No free fluid is identified. The small bowel is unremarkable in appearance. The stomach is within normal limits. No acute vascular abnormalities are seen. Scattered calcification is noted along the abdominal aorta and its branches.  The appendix is not definitely seen; there is no evidence for appendicitis.  Diffuse wall thickening is noted along the sigmoid colon, particularly along the distal sigmoid colon and rectum, concerning for acute infectious or inflammatory colitis. There is no evidence for ischemia. The degree of wall thickening along the rectum is unusual, and an underlying mass cannot be excluded. Mild surrounding soft tissue inflammation is seen.  Scattered diverticulosis is noted along the mid sigmoid colon, without evidence of diverticulitis.  The bladder is mildly distended and grossly unremarkable. The patient is status post hysterectomy. No suspicious adnexal masses are seen. The ovaries are relatively symmetric. No inguinal lymphadenopathy is seen.  No acute osseous abnormalities are identified. Multilevel vacuum phenomenon and sclerotic change are noted along the lumbar spine, with underlying facet disease. There is mild grade 1 anterolisthesis of L4 on L5, reflecting  facet disease.  IMPRESSION:  1. Acute infectious or inflammatory colitis involving the sigmoid colon, particularly prominent along the distal sigmoid colon and rectum. The degree of wall thickening at the rectum is unusually prominent, and an underlying mass cannot be excluded. Would perform sigmoidoscopy after completion of treatment for colitis. 2. Scattered diverticulosis along the mid sigmoid colon, without evidence of diverticulitis. 3. Scattered calcification along the abdominal aorta and its branches. 4. Mild degenerative change along the lumbar spine.   Electronically Signed   By: Garald Balding M.D.   On: 10/31/2014 02:16    Microbiology: Recent Results (from the past 240 hour(s))  Clostridium Difficile by PCR     Status: None   Collection Time: 10/30/14 11:44 PM  Result Value Ref Range Status   C difficile by pcr NEGATIVE NEGATIVE Final     Labs: Basic Metabolic Panel:  Recent Labs Lab 10/30/14 2330 10/31/14 0615 11/01/14 0408  NA 139 138 139  K 2.7* 3.2* 3.8  CL 97* 100* 104  CO2 29 28 27   GLUCOSE 112* 127* 96  BUN 16 11 7   CREATININE 0.83 0.69 0.57  CALCIUM 9.3 8.6* 8.8*   Liver Function Tests:  Recent Labs Lab 10/30/14 2330  AST 27  ALT 17  ALKPHOS 69  BILITOT 0.8  PROT 6.8  ALBUMIN 3.8    Recent Labs Lab 10/30/14 2330  LIPASE 15*   No results for input(s): AMMONIA in the last 168 hours. CBC:  Recent Labs Lab 10/30/14 2330 10/31/14 0615 11/01/14 0408  WBC 9.9 10.4 5.5  NEUTROABS 7.1  --   --   HGB 11.9* 10.5* 10.1*  HCT 37.7 33.8* 33.6*  MCV 87.3 87.3 89.1  PLT 385 325 276   Cardiac Enzymes: No results for input(s): CKTOTAL, CKMB, CKMBINDEX, TROPONINI in the last 168 hours. BNP: BNP (last 3 results) No results for input(s): BNP in the last 8760 hours.  ProBNP (last 3 results) No results for input(s): PROBNP in the last 8760 hours.  CBG:  Recent Labs Lab 10/31/14 1142 10/31/14 1659 10/31/14 2133 11/01/14 0802 11/01/14 1143   GLUCAP 96 101* 120* 96 109*       Signed:  Vernell Leep, MD, FACP, FHM. [facility] Pager [phone]  If 7PM-7AM, please contact night-coverage [email or web address] Password [email or web address] 11/01/2014, 1:56 PM

## 2014-11-01 NOTE — Evaluation (Addendum)
Physical Therapy Evaluation-1x Patient Details Name: Debra Short MRN: [id number] DOB: [birth year] Today's Date: 11/01/2014   History of Present Illness  74 yo female admitted with colitis. L ankle ORIF 09/26/2014. Pt recently completed ST rehab at SNF and is having HHPT at home. Pt lives alone.   Clinical Impression  On eval, pt was Mod Ind with transfers x2 , bed<>WC. Pt has good safety awareness and adheres well to NWB status. Pt has been at home alone, after completing ST rehab, for 1-2 weeks now. Recommend pt resume HHPT and ambulance transport home. Pt states family brings in meals and checks on pt occasionally.     Follow Up Recommendations Home health PT (ambulance transport home)    Equipment Recommendations  None recommended by PT    Recommendations for Other Services       Precautions / Restrictions Precautions Precautions: None Required Braces or Orthoses: Other Brace/Splint Other Brace/Splint: CAM boot L LE Restrictions Weight Bearing Restrictions: Yes LLE Weight Bearing: Non weight bearing      Mobility  Bed Mobility Overal bed mobility: Modified Independent                Transfers Overall transfer level: Modified independent               General transfer comment: Squat pivot x 2, bed<>wheelchair with armrests up. Pt able to maintain NWB on L LE.   Ambulation/Gait             General Gait Details: NT  Stairs            Wheelchair Mobility    Modified Rankin (Stroke Patients Only)       Balance Overall balance assessment:  (No LOB during transfers)                                           Pertinent Vitals/Pain Pain Assessment: 0-10 Pain Score: 4  Pain Location: L ankle Pain Descriptors / Indicators: Sore Pain Intervention(s): Monitored during session;Repositioned    Home Living Family/patient expects to be discharged to:: Private residence Living Arrangements: Alone   Type of Home: House Home  Access: Stairs to enter   Technical brewer of Steps: 2 Home Layout: One level Home Equipment: Environmental consultant - 2 wheels;Wheelchair - manual;Bedside commode      Prior Function           Comments: Mod Ind with transfers to/from wheelchair and use of RW some inside home (uses wheelchair mostly)     Hand Dominance        Extremity/Trunk Assessment   Upper Extremity Assessment: Overall WFL for tasks assessed           Lower Extremity Assessment: LLE deficits/detail   LLE Deficits / Details: CAM boot in place     Communication   Communication: No difficulties  Cognition Arousal/Alertness: Awake/alert Behavior During Therapy: WFL for tasks assessed/performed Overall Cognitive Status: Within Functional Limits for tasks assessed                      General Comments      Exercises        Assessment/Plan    PT Assessment All further PT needs can be met in the next venue of care (HHPT follow up)  PT Diagnosis     PT Problem List Pain;Decreased mobility  PT Treatment Interventions  PT Goals (Current goals can be found in the Care Plan section) Acute Rehab PT Goals Patient Stated Goal: home. regain independence/PLOF PT Goal Formulation: All assessment and education complete, DC therapy    Frequency     Barriers to discharge        Co-evaluation               End of Session   Activity Tolerance: Patient tolerated treatment well Patient left: in bed;with call bell/phone within reach;with bed alarm set           Time: 6045-4098 PT Time Calculation (min) (ACUTE ONLY): 20 min   Charges:   PT Evaluation $Initial PT Evaluation Tier I: 1 Procedure     PT G Codes:        Weston Anna, MPT Pager: [phone]

## 2014-11-04 DIAGNOSIS — Z9889 Other specified postprocedural states: Secondary | ICD-10-CM | POA: Diagnosis not present

## 2014-11-09 DIAGNOSIS — S82842D Displaced bimalleolar fracture of left lower leg, subsequent encounter for closed fracture with routine healing: Secondary | ICD-10-CM | POA: Diagnosis not present

## 2014-11-09 DIAGNOSIS — M1990 Unspecified osteoarthritis, unspecified site: Secondary | ICD-10-CM | POA: Diagnosis not present

## 2014-11-09 DIAGNOSIS — E119 Type 2 diabetes mellitus without complications: Secondary | ICD-10-CM | POA: Diagnosis not present

## 2014-11-09 DIAGNOSIS — F418 Other specified anxiety disorders: Secondary | ICD-10-CM | POA: Diagnosis not present

## 2014-11-09 DIAGNOSIS — Z9181 History of falling: Secondary | ICD-10-CM | POA: Diagnosis not present

## 2014-11-09 DIAGNOSIS — I10 Essential (primary) hypertension: Secondary | ICD-10-CM | POA: Diagnosis not present

## 2014-11-18 DIAGNOSIS — I10 Essential (primary) hypertension: Secondary | ICD-10-CM | POA: Diagnosis not present

## 2014-11-18 DIAGNOSIS — E119 Type 2 diabetes mellitus without complications: Secondary | ICD-10-CM | POA: Diagnosis not present

## 2014-11-18 DIAGNOSIS — S82842D Displaced bimalleolar fracture of left lower leg, subsequent encounter for closed fracture with routine healing: Secondary | ICD-10-CM | POA: Diagnosis not present

## 2014-11-18 DIAGNOSIS — Z9181 History of falling: Secondary | ICD-10-CM | POA: Diagnosis not present

## 2014-11-18 DIAGNOSIS — M1990 Unspecified osteoarthritis, unspecified site: Secondary | ICD-10-CM | POA: Diagnosis not present

## 2014-11-18 DIAGNOSIS — F418 Other specified anxiety disorders: Secondary | ICD-10-CM | POA: Diagnosis not present

## 2014-11-21 ENCOUNTER — Other Ambulatory Visit: Payer: Self-pay | Admitting: Internal Medicine

## 2014-11-27 ENCOUNTER — Ambulatory Visit (INDEPENDENT_AMBULATORY_CARE_PROVIDER_SITE_OTHER): Payer: Medicare Other | Admitting: Internal Medicine

## 2014-11-27 ENCOUNTER — Encounter: Payer: Self-pay | Admitting: Internal Medicine

## 2014-11-27 VITALS — BP 124/74 | HR 72 | Temp 98.2°F | Resp 16 | Ht 65.5 in

## 2014-11-27 DIAGNOSIS — Z79899 Other long term (current) drug therapy: Secondary | ICD-10-CM | POA: Diagnosis not present

## 2014-11-27 DIAGNOSIS — E559 Vitamin D deficiency, unspecified: Secondary | ICD-10-CM

## 2014-11-27 DIAGNOSIS — IMO0001 Reserved for inherently not codable concepts without codable children: Secondary | ICD-10-CM

## 2014-11-27 DIAGNOSIS — I10 Essential (primary) hypertension: Secondary | ICD-10-CM

## 2014-11-27 DIAGNOSIS — E119 Type 2 diabetes mellitus without complications: Secondary | ICD-10-CM | POA: Diagnosis not present

## 2014-11-27 DIAGNOSIS — E785 Hyperlipidemia, unspecified: Secondary | ICD-10-CM | POA: Diagnosis not present

## 2014-11-27 DIAGNOSIS — R7303 Prediabetes: Secondary | ICD-10-CM

## 2014-11-27 LAB — BASIC METABOLIC PANEL WITH GFR
BUN: 19 mg/dL (ref 6–23)
CO2: 30 mEq/L (ref 19–32)
Calcium: 9.5 mg/dL (ref 8.4–10.5)
Chloride: 98 mEq/L (ref 96–112)
Creat: 0.79 mg/dL (ref 0.50–1.10)
GFR, Est African American: 84 mL/min
GFR, Est Non African American: 73 mL/min
Glucose, Bld: 96 mg/dL (ref 70–99)
Potassium: 4.4 mEq/L (ref 3.5–5.3)
Sodium: 140 mEq/L (ref 135–145)

## 2014-11-27 LAB — LIPID PANEL
Cholesterol: 184 mg/dL (ref 0–200)
HDL: 38 mg/dL — ABNORMAL LOW (ref >=46)
LDL Cholesterol: 102 mg/dL — ABNORMAL HIGH (ref 0–99)
Total CHOL/HDL Ratio: 4.8 Ratio
Triglycerides: 218 mg/dL — ABNORMAL HIGH (ref <150)
VLDL: 44 mg/dL — ABNORMAL HIGH (ref 0–40)

## 2014-11-27 LAB — CBC WITH DIFFERENTIAL/PLATELET
Basophils Absolute: 0 10*3/uL (ref 0.0–0.1)
Basophils Relative: 1 % (ref 0–1)
Eosinophils Absolute: 0.2 10*3/uL (ref 0.0–0.7)
Eosinophils Relative: 5 % (ref 0–5)
HCT: 35.6 % — ABNORMAL LOW (ref 36.0–46.0)
Hemoglobin: 11 g/dL — ABNORMAL LOW (ref 12.0–15.0)
Lymphocytes Relative: 25 % (ref 12–46)
Lymphs Abs: 1.2 10*3/uL (ref 0.7–4.0)
MCH: 26.6 pg (ref 26.0–34.0)
MCHC: 30.9 g/dL (ref 30.0–36.0)
MCV: 86 fL (ref 78.0–100.0)
MPV: 9.1 fL (ref 8.6–12.4)
Monocytes Absolute: 0.7 10*3/uL (ref 0.1–1.0)
Monocytes Relative: 15 % — ABNORMAL HIGH (ref 3–12)
Neutro Abs: 2.5 10*3/uL (ref 1.7–7.7)
Neutrophils Relative %: 54 % (ref 43–77)
Platelets: 337 10*3/uL (ref 150–400)
RBC: 4.14 MIL/uL (ref 3.87–5.11)
RDW: 15 % (ref 11.5–15.5)
WBC: 4.6 10*3/uL (ref 4.0–10.5)

## 2014-11-27 LAB — HEPATIC FUNCTION PANEL
ALT: 10 U/L (ref 0–35)
AST: 18 U/L (ref 0–37)
Albumin: 4 g/dL (ref 3.5–5.2)
Alkaline Phosphatase: 61 U/L (ref 39–117)
Bilirubin, Direct: 0.1 mg/dL (ref 0.0–0.3)
Indirect Bilirubin: 0.6 mg/dL (ref 0.2–1.2)
Total Bilirubin: 0.7 mg/dL (ref 0.2–1.2)
Total Protein: 6.7 g/dL (ref 6.0–8.3)

## 2014-11-27 LAB — TSH: TSH: 2.124 u[IU]/mL (ref 0.350–4.500)

## 2014-11-27 LAB — MAGNESIUM: Magnesium: 1.6 mg/dL (ref 1.5–2.5)

## 2014-11-27 LAB — HEMOGLOBIN A1C
Hgb A1c MFr Bld: 5.5 % (ref <5.7)
Mean Plasma Glucose: 111 mg/dL (ref <117)

## 2014-11-27 MED ORDER — ZOLPIDEM TARTRATE 10 MG PO TABS: 5.0000 mg | ORAL_TABLET | Freq: Every day | ORAL | Status: DC

## 2014-11-27 MED ORDER — FLUOXETINE HCL 40 MG PO CAPS: 40.0000 mg | ORAL_CAPSULE | Freq: Every day | ORAL | Status: DC

## 2014-11-27 MED ORDER — ATORVASTATIN CALCIUM 80 MG PO TABS: ORAL_TABLET | ORAL | Status: DC

## 2014-11-27 MED ORDER — MONTELUKAST SODIUM 10 MG PO TABS: ORAL_TABLET | ORAL | Status: DC

## 2014-11-27 MED ORDER — METFORMIN HCL ER 500 MG PO TB24: 500.0000 mg | ORAL_TABLET | Freq: Every day | ORAL | Status: DC

## 2014-11-27 MED ORDER — ALPRAZOLAM 1 MG PO TABS: 1.0000 mg | ORAL_TABLET | Freq: Two times a day (BID) | ORAL | Status: DC | PRN

## 2014-11-27 MED ORDER — BISOPROLOL-HYDROCHLOROTHIAZIDE 5-6.25 MG PO TABS: 1.0000 | ORAL_TABLET | Freq: Every day | ORAL | Status: DC

## 2014-11-27 NOTE — Progress Notes (Signed)
Patient ID: Debra Short, female   DOB: [birth year], 74 y.o.   MRN: [id number]  Assessment and Plan:  Hypertension:  -Continue medication -monitor blood pressure at home. -Continue DASH diet -Reminder to go to the ER if any CP, SOB, nausea, dizziness, severe HA, changes vision/speech, left arm numbness and tingling and jaw pain.  Cholesterol - Continue diet and exercise -Check cholesterol.   Diabetes without complications -Continue diet and exercise.  -Check A1C  Vitamin D Def -check level -continue medications.   Continue diet and meds as discussed. Further disposition pending results of labs. Discussed med's effects and SE's.    HPI 74 y.o. female  presents for 3 month follow up with hypertension, hyperlipidemia, diabetes and vitamin D deficiency.   Her blood pressure has been controlled at home, today their BP is BP: (!) 152/66 mmHg.She does workout. She denies chest pain, shortness of breath, dizziness.   She is on cholesterol medication and denies myalgias. Her cholesterol is not at goal. The cholesterol was:  06/26/2014: Cholesterol 211*; HDL 38*; LDL Cholesterol 135*; Triglycerides 191*   She has been working on diet and exercise for diabetes without complications, she is on bASA, she is on ACE/ARB, and denies  foot ulcerations, hyperglycemia, hypoglycemia , increased appetite, nausea, paresthesia of the feet, polydipsia, polyuria, visual disturbances, vomiting and weight loss. Last A1C was: 06/26/2014: Hgb A1c MFr Bld 6.4*   Patient is on Vitamin D supplement. No results found for requested labs within last 365 days.  Patient reports that approximately 2 months ago she broke her ankle getting up out of her lazy boy.  She reports that it has been doing okay since and she is due to be removed from the boot  She also reports that she has had some recurrence of her collitis several months ago as well. She reports that she has lot some weight and has been watching her diet and  that has been helping.    Current Medications:  Current Outpatient Prescriptions on File Prior to Visit  Medication Sig Dispense Refill  . aspirin 81 MG chewable tablet Chew by mouth daily.    . celecoxib (CELEBREX) 200 MG capsule Take 200 mg by mouth daily as needed for mild pain.    . Cholecalciferol (VITAMIN D PO) Take 5,000-10,000 Units by mouth daily. Takes 5,000 units daily, except takes 10,000 units on Mondays, Wednesdays, and Fridays.    Marland Kitchen esomeprazole (NEXIUM) 20 MG capsule Take 20 mg by mouth 2 (two) times daily before a meal.     . feeding supplement, GLUCERNA SHAKE, (GLUCERNA SHAKE) LIQD Take 237 mLs by mouth 3 (three) times daily between meals.    . gabapentin (NEURONTIN) 300 MG capsule Take 300 mg by mouth 2 (two) times daily as needed (pain).   3  . HYDROcodone-acetaminophen (NORCO) 10-325 MG per tablet Take 1 tablet by mouth every 4 (four) hours as needed for moderate pain.     . Multiple Vitamin (MULTIVITAMIN) tablet Take 1 tablet by mouth daily.    . rizatriptan (MAXALT) 10 MG tablet Take 10 mg by mouth daily as needed for migraine.   1  . triamcinolone (NASACORT) 55 MCG/ACT AERO nasal inhaler Place 2 sprays into the nose daily as needed (for seasonal allergies).      No current facility-administered medications on file prior to visit.   Medical History:  Past Medical History  Diagnosis Date  . Hyperlipidemia   . Hypertension   . Arthritis   . GERD (gastroesophageal  reflux disease)   . Vitamin D deficiency   . IBS (irritable bowel syndrome)   . Prediabetes   . Large cell lymphoma   . Diabetes mellitus without complication   . Depression   . Anxiety   . Headache   . Fibromyalgia    Allergies:  Allergies  Allergen Reactions  . Effexor [Venlafaxine] Other (See Comments)    REACTION:  unknown  . Nortriptyline Other (See Comments)    REACTION:  unknown  . Paxil [Paroxetine Hcl] Other (See Comments)    REACTION:  unknown  . Robaxin [Methocarbamol] Itching  .  Tizanidine Itching  . Zoloft [Sertraline Hcl] Other (See Comments)    REACTION: unknown     Review of Systems:  Review of Systems  Constitutional: Positive for weight loss (with effort to lose weight). Negative for fever, chills and malaise/fatigue.  HENT: Negative for congestion, ear pain and sore throat.   Eyes: Negative.   Respiratory: Negative for cough, shortness of breath and wheezing.   Cardiovascular: Negative for chest pain, palpitations and leg swelling.  Gastrointestinal: Negative for heartburn, nausea, vomiting, diarrhea, constipation, blood in stool and melena.  Genitourinary: Negative.   Skin: Negative.   Neurological: Negative for dizziness, sensory change, loss of consciousness and headaches.  Psychiatric/Behavioral: Negative for depression. The patient is not nervous/anxious and does not have insomnia.   All other systems reviewed and are negative.   Family history- Review and unchanged  Social history- Review and unchanged  Physical Exam: BP 152/66 mmHg  Pulse 72  Temp(Src) 98.2 F (36.8 C) (Temporal)  Resp 16  Ht 5' 5.5" (1.664 m) Wt Readings from Last 3 Encounters:  10/31/14 158 lb 15.2 oz (72.1 kg)  10/16/14 164 lb (74.39 kg)  10/02/14 164 lb 14.4 oz (74.798 kg)   General Appearance: Well nourished well developed, non-toxic appearing, in no apparent distress. Eyes: PERRLA, EOMs, conjunctiva no swelling or erythema ENT/Mouth: Ear canals clear with no erythema, swelling, or discharge.  TMs normal bilaterally, oropharynx clear, moist, with no exudate.   Neck: Supple, thyroid normal, no JVD, no cervical adenopathy.  Respiratory: Respiratory effort normal, breath sounds clear A&P, no wheeze, rhonchi or rales noted.  No retractions, no accessory muscle usage Cardio: RRR with no MRGs. No noted edema.  Abdomen: Soft, + BS.  Non tender, no guarding, rebound, hernias, masses. Musculoskeletal: Full ROM, 5/5 strength, Normal gait.  Boot located on left lower  extremity. Skin: Warm, dry without rashes, lesions, ecchymosis.  Neuro: Awake and oriented X 3, Cranial nerves intact. No cerebellar symptoms.  Psych: normal affect, Insight and Judgment appropriate.    FORCUCCI, Azekiel Cremer, PA-C 10:38 AM [facility]

## 2014-11-27 NOTE — Patient Instructions (Signed)

## 2014-11-28 ENCOUNTER — Other Ambulatory Visit: Payer: Self-pay | Admitting: Internal Medicine

## 2014-11-28 LAB — VITAMIN D 25 HYDROXY (VIT D DEFICIENCY, FRACTURES): Vit D, 25-Hydroxy: 59 ng/mL (ref 30–100)

## 2014-11-28 LAB — INSULIN, RANDOM: Insulin: 12 u[IU]/mL (ref 2.0–19.6)

## 2014-12-02 DIAGNOSIS — S82842D Displaced bimalleolar fracture of left lower leg, subsequent encounter for closed fracture with routine healing: Secondary | ICD-10-CM | POA: Diagnosis not present

## 2014-12-29 DIAGNOSIS — G43719 Chronic migraine without aura, intractable, without status migrainosus: Secondary | ICD-10-CM | POA: Diagnosis not present

## 2014-12-29 DIAGNOSIS — G43019 Migraine without aura, intractable, without status migrainosus: Secondary | ICD-10-CM | POA: Diagnosis not present

## 2014-12-30 DIAGNOSIS — S82842D Displaced bimalleolar fracture of left lower leg, subsequent encounter for closed fracture with routine healing: Secondary | ICD-10-CM | POA: Diagnosis not present

## 2015-01-06 DIAGNOSIS — E119 Type 2 diabetes mellitus without complications: Secondary | ICD-10-CM | POA: Diagnosis not present

## 2015-01-06 DIAGNOSIS — Z961 Presence of intraocular lens: Secondary | ICD-10-CM | POA: Diagnosis not present

## 2015-01-27 DIAGNOSIS — Z5181 Encounter for therapeutic drug level monitoring: Secondary | ICD-10-CM | POA: Diagnosis not present

## 2015-02-09 DIAGNOSIS — M542 Cervicalgia: Secondary | ICD-10-CM | POA: Diagnosis not present

## 2015-02-09 DIAGNOSIS — G43719 Chronic migraine without aura, intractable, without status migrainosus: Secondary | ICD-10-CM | POA: Diagnosis not present

## 2015-02-09 DIAGNOSIS — G518 Other disorders of facial nerve: Secondary | ICD-10-CM | POA: Diagnosis not present

## 2015-02-09 DIAGNOSIS — R51 Headache: Secondary | ICD-10-CM | POA: Diagnosis not present

## 2015-02-09 DIAGNOSIS — M791 Myalgia: Secondary | ICD-10-CM | POA: Diagnosis not present

## 2015-02-09 DIAGNOSIS — G43019 Migraine without aura, intractable, without status migrainosus: Secondary | ICD-10-CM | POA: Diagnosis not present

## 2015-02-27 ENCOUNTER — Ambulatory Visit (INDEPENDENT_AMBULATORY_CARE_PROVIDER_SITE_OTHER): Payer: Medicare Other | Admitting: Internal Medicine

## 2015-02-27 ENCOUNTER — Encounter: Payer: Self-pay | Admitting: Internal Medicine

## 2015-02-27 ENCOUNTER — Other Ambulatory Visit: Payer: Self-pay | Admitting: *Deleted

## 2015-02-27 VITALS — BP 116/80 | HR 80 | Temp 97.9°F | Resp 16 | Ht 66.0 in | Wt 160.2 lb

## 2015-02-27 DIAGNOSIS — Z79899 Other long term (current) drug therapy: Secondary | ICD-10-CM | POA: Diagnosis not present

## 2015-02-27 DIAGNOSIS — E559 Vitamin D deficiency, unspecified: Secondary | ICD-10-CM

## 2015-02-27 DIAGNOSIS — Z1389 Encounter for screening for other disorder: Secondary | ICD-10-CM | POA: Diagnosis not present

## 2015-02-27 DIAGNOSIS — K589 Irritable bowel syndrome without diarrhea: Secondary | ICD-10-CM | POA: Diagnosis not present

## 2015-02-27 DIAGNOSIS — K219 Gastro-esophageal reflux disease without esophagitis: Secondary | ICD-10-CM

## 2015-02-27 DIAGNOSIS — E785 Hyperlipidemia, unspecified: Secondary | ICD-10-CM | POA: Diagnosis not present

## 2015-02-27 DIAGNOSIS — G4700 Insomnia, unspecified: Secondary | ICD-10-CM

## 2015-02-27 DIAGNOSIS — Z23 Encounter for immunization: Secondary | ICD-10-CM

## 2015-02-27 DIAGNOSIS — E119 Type 2 diabetes mellitus without complications: Secondary | ICD-10-CM | POA: Diagnosis not present

## 2015-02-27 DIAGNOSIS — I10 Essential (primary) hypertension: Secondary | ICD-10-CM

## 2015-02-27 DIAGNOSIS — Z789 Other specified health status: Secondary | ICD-10-CM

## 2015-02-27 DIAGNOSIS — Z9181 History of falling: Secondary | ICD-10-CM

## 2015-02-27 DIAGNOSIS — Z1331 Encounter for screening for depression: Secondary | ICD-10-CM

## 2015-02-27 DIAGNOSIS — Z1212 Encounter for screening for malignant neoplasm of rectum: Secondary | ICD-10-CM

## 2015-02-27 LAB — BASIC METABOLIC PANEL WITH GFR
BUN: 19 mg/dL (ref 7–25)
CO2: 31 mmol/L (ref 20–31)
Calcium: 9.2 mg/dL (ref 8.6–10.4)
Chloride: 101 mmol/L (ref 98–110)
Creat: 0.71 mg/dL (ref 0.60–0.93)
GFR, Est African American: >89 mL/min (ref >=60)
GFR, Est Non African American: 83 mL/min (ref >=60)
Glucose, Bld: 100 mg/dL — ABNORMAL HIGH (ref 65–99)
Potassium: 4.9 mmol/L (ref 3.5–5.3)
Sodium: 139 mmol/L (ref 135–146)

## 2015-02-27 LAB — HEPATIC FUNCTION PANEL
ALT: 22 U/L (ref 6–29)
AST: 22 U/L (ref 10–35)
Albumin: 4 g/dL (ref 3.6–5.1)
Alkaline Phosphatase: 71 U/L (ref 33–130)
Bilirubin, Direct: 0.2 mg/dL (ref <=0.2)
Indirect Bilirubin: 0.5 mg/dL (ref 0.2–1.2)
Total Bilirubin: 0.7 mg/dL (ref 0.2–1.2)
Total Protein: 6.5 g/dL (ref 6.1–8.1)

## 2015-02-27 LAB — LIPID PANEL
Cholesterol: 159 mg/dL (ref 125–200)
HDL: 46 mg/dL (ref >=46)
LDL Cholesterol: 86 mg/dL (ref <130)
Total CHOL/HDL Ratio: 3.5 Ratio (ref <=5.0)
Triglycerides: 134 mg/dL (ref <150)
VLDL: 27 mg/dL (ref <30)

## 2015-02-27 LAB — CBC WITH DIFFERENTIAL/PLATELET
Basophils Absolute: 0.1 10*3/uL (ref 0.0–0.1)
Basophils Relative: 1 % (ref 0–1)
Eosinophils Absolute: 0.4 10*3/uL (ref 0.0–0.7)
Eosinophils Relative: 6 % — ABNORMAL HIGH (ref 0–5)
HCT: 33 % — ABNORMAL LOW (ref 36.0–46.0)
Hemoglobin: 10.4 g/dL — ABNORMAL LOW (ref 12.0–15.0)
Lymphocytes Relative: 24 % (ref 12–46)
Lymphs Abs: 1.6 10*3/uL (ref 0.7–4.0)
MCH: 26.1 pg (ref 26.0–34.0)
MCHC: 31.5 g/dL (ref 30.0–36.0)
MCV: 82.7 fL (ref 78.0–100.0)
MPV: 8.6 fL (ref 8.6–12.4)
Monocytes Absolute: 0.8 10*3/uL (ref 0.1–1.0)
Monocytes Relative: 12 % (ref 3–12)
Neutro Abs: 3.8 10*3/uL (ref 1.7–7.7)
Neutrophils Relative %: 57 % (ref 43–77)
Platelets: 296 10*3/uL (ref 150–400)
RBC: 3.99 MIL/uL (ref 3.87–5.11)
RDW: 15.7 % — ABNORMAL HIGH (ref 11.5–15.5)
WBC: 6.6 10*3/uL (ref 4.0–10.5)

## 2015-02-27 LAB — MAGNESIUM: Magnesium: 2 mg/dL (ref 1.5–2.5)

## 2015-02-27 MED ORDER — NORTRIPTYLINE HCL 50 MG PO CAPS: ORAL_CAPSULE | ORAL | Status: DC

## 2015-02-27 MED ORDER — ALPRAZOLAM 1 MG PO TABS: 1.0000 mg | ORAL_TABLET | Freq: Two times a day (BID) | ORAL | Status: DC | PRN

## 2015-02-27 NOTE — Patient Instructions (Signed)
Recommend Adult Low dose Aspirin or   coated  Aspirin 81 mg daily   To reduce risk of Colon Cancer 20 %,   Skin Cancer 26 % ,   Melanoma 46%   and   Pancreatic cancer 60%  ++++++++++++++++++  Vitamin D goal   is between 70-100.   Please make sure that you are taking your Vitamin D as directed.   It is very important as a natural anti-inflammatory   helping hair, skin, and nails, as well as reducing stroke and heart attack risk.   It helps your bones and helps with mood.  It also decreases numerous cancer risks so please take it as directed.   Low Vit D is associated with a 200-300% higher risk for CANCER   and 200-300% higher risk for HEART   ATTACK  &  STROKE.   ......................................  It is also associated with higher death rate at younger ages,   autoimmune diseases like Rheumatoid arthritis, Lupus, Multiple Sclerosis.     Also many other serious conditions, like depression, Alzheimer's  Dementia, infertility, muscle aches, fatigue, fibromyalgia - just to name a few.  +++++++++++++++++++  Recommend the book "The END of DIETING" by Dr Joel Fuhrman   & the book "The END of DIABETES " by Dr Joel Fuhrman  At [employer] - get book & Audio CD's     Being diabetic has a  300% increased risk for heart attack, stroke, cancer, and alzheimer- type vascular dementia. It is very important that you work harder with diet by avoiding all foods that are white. Avoid white rice (brown & wild rice is OK), white potatoes (sweetpotatoes in moderation is OK), White bread or wheat bread or anything made out of white flour like bagels, donuts, rolls, buns, biscuits, cakes, pastries, cookies, pizza crust, and pasta (made from white flour & egg whites) - vegetarian pasta or spinach or wheat pasta is OK. Multigrain breads like Arnold's or [employer], or multigrain sandwich thins or flatbreads.  Diet, exercise and weight loss can reverse and cure diabetes in the early  stages.  Diet, exercise and weight loss is very important in the control and prevention of complications of diabetes which affects every system in your body, ie. Brain - dementia/stroke, eyes - glaucoma/blindness, heart - heart attack/heart failure, kidneys - dialysis, stomach - gastric paralysis, intestines - malabsorption, nerves - severe painful neuritis, circulation - gangrene & loss of a leg(s), and finally cancer and Alzheimers.    I recommend avoid fried & greasy foods,  sweets/candy, white rice (brown or wild rice or Quinoa is OK), white potatoes (sweet potatoes are OK) - anything made from white flour - bagels, doughnuts, rolls, buns, biscuits,white and wheat breads, pizza crust and traditional pasta made of white flour & egg white(vegetarian pasta or spinach or wheat pasta is OK).  Multi-grain bread is OK - like multi-grain flat bread or sandwich thins. Avoid alcohol in excess. Exercise is also important.    Eat all the vegetables you want - avoid meat, especially red meat and dairy - especially cheese.  Cheese is the most concentrated form of trans-fats which is the worst thing to clog up our arteries. Veggie cheese is OK which can be found in the fresh produce section at [employer] or [employer] or [employer]  ++++++++++++++++++++++++++   Preventive Care for Adults  A healthy lifestyle and preventive care can promote health and wellness. Preventive health guidelines for women include the following key practices.  A routine   to check with your health care provider about your health and preventive screening. It is a chance to share any concerns and updates on your health and to receive a thorough exam.  Visit your dentist for a routine exam and preventive care every 6 months. Brush your teeth twice a day and floss once a day. Good oral hygiene prevents tooth decay and gum disease.  The frequency of eye exams is based on your age, health, family medical history, use  of contact lenses, and other factors. Follow your health care provider's recommendations for frequency of eye exams.  Eat a healthy diet. Foods like vegetables, fruits, whole grains, low-fat dairy products, and lean protein foods contain the nutrients you need without too many calories. Decrease your intake of foods high in solid fats, added sugars, and salt. Eat the right amount of calories for you.Get information about a proper diet from your health care provider, if necessary.  Regular physical exercise is one of the most important things you can do for your health. Most adults should get at least 150 minutes of moderate-intensity exercise (any activity that increases your heart rate and causes you to sweat) each week. In addition, most adults need muscle-strengthening exercises on 2 or more days a week.  Maintain a healthy weight. The body mass index (BMI) is a screening tool to identify possible weight problems. It provides an estimate of body fat based on height and weight. Your health care provider can find your BMI and can help you achieve or maintain a healthy weight.For adults 20 years and older:  A BMI below 18.5 is considered underweight.  A BMI of 18.5 to 24.9 is normal.  A BMI of 25 to 29.9 is considered overweight.  A BMI of 30 and above is considered obese.  Maintain normal blood lipids and cholesterol levels by exercising and minimizing your intake of saturated fat. Eat a balanced diet with plenty of fruit and vegetables. If your lipid or cholesterol levels are high, you are over 50, or you are at high risk for heart disease, you may need your cholesterol levels checked more frequently.Ongoing high lipid and cholesterol levels should be treated with medicines if diet and exercise are not working.  If you smoke, find out from your health care provider how to quit. If you do not use tobacco, do not start.  Lung cancer screening is recommended for adults aged 55-80 years who are  at high risk for developing lung cancer because of a history of smoking. A yearly low-dose CT scan of the lungs is recommended for people who have at least a 30-pack-year history of smoking and are a current smoker or have quit within the past 15 years. A pack year of smoking is smoking an average of 1 pack of cigarettes a day for 1 year (for example: 1 pack a day for 30 years or 2 packs a day for 15 years). Yearly screening should continue until the smoker has stopped smoking for at least 15 years. Yearly screening should be stopped for people who develop a health problem that would prevent them from having lung cancer treatment.  Avoid use of street drugs. Do not share needles with anyone. Ask for help if you need support or instructions about stopping the use of drugs.  High blood pressure causes heart disease and increases the risk of stroke.  Ongoing high blood pressure should be treated with medicines if weight loss and exercise do not work.  If you are 55-79   years old, ask your health care provider if you should take aspirin to prevent strokes.  Diabetes screening involves taking a blood sample to check your fasting blood sugar level. This should be done once every 3 years, after age 45, if you are within normal weight and without risk factors for diabetes. Testing should be considered at a younger age or be carried out more frequently if you are overweight and have at least 1 risk factor for diabetes.  Breast cancer screening is essential preventive care for women. You should practice "breast self-awareness." This means understanding the normal appearance and feel of your breasts and may include breast self-examination. Any changes detected, no matter how small, should be reported to a health care provider. Women in their 20s and 30s should have a clinical breast exam (CBE) by a health care provider as part of a regular health exam every 1 to 3 years. After age 40, women should have a CBE every  year. Starting at age 40, women should consider having a mammogram (breast X-ray test) every year. Women who have a family history of breast cancer should talk to their health care provider about genetic screening. Women at a high risk of breast cancer should talk to their health care providers about having an MRI and a mammogram every year.  Breast cancer gene (BRCA)-related cancer risk assessment is recommended for women who have family members with BRCA-related cancers. BRCA-related cancers include breast, ovarian, tubal, and peritoneal cancers. Having family members with these cancers may be associated with an increased risk for harmful changes (mutations) in the breast cancer genes BRCA1 and BRCA2. Results of the assessment will determine the need for genetic counseling and BRCA1 and BRCA2 testing.  Routine pelvic exams to screen for cancer are no longer recommended for nonpregnant women who are considered low risk for cancer of the pelvic organs (ovaries, uterus, and vagina) and who do not have symptoms. Ask your health care provider if a screening pelvic exam is right for you.  If you have had past treatment for cervical cancer or a condition that could lead to cancer, you need Pap tests and screening for cancer for at least 20 years after your treatment. If Pap tests have been discontinued, your risk factors (such as having a new sexual partner) need to be reassessed to determine if screening should be resumed. Some women have medical problems that increase the chance of getting cervical cancer. In these cases, your health care provider may recommend more frequent screening and Pap tests.    Colorectal cancer can be detected and often prevented. Most routine colorectal cancer screening begins at the age of 50 years and continues through age 75 years. However, your health care provider may recommend screening at an earlier age if you have risk factors for colon cancer. On a yearly basis, your health  care provider may provide home test kits to check for hidden blood in the stool. Use of a small camera at the end of a tube, to directly examine the colon (sigmoidoscopy or colonoscopy), can detect the earliest forms of colorectal cancer. Talk to your health care provider about this at age 50, when routine screening begins. Direct exam of the colon should be repeated every 5-10 years through age 75 years, unless early forms of pre-cancerous polyps or small growths are found.  Osteoporosis is a disease in which the bones lose minerals and strength with aging. This can result in serious bone fractures or breaks. The risk of osteoporosis   can be identified using a bone density scan. Women ages 65 years and over and women at risk for fractures or osteoporosis should discuss screening with their health care providers. Ask your health care provider whether you should take a calcium supplement or vitamin D to reduce the rate of osteoporosis.  Menopause can be associated with physical symptoms and risks. Hormone replacement therapy is available to decrease symptoms and risks. You should talk to your health care provider about whether hormone replacement therapy is right for you.  Use sunscreen. Apply sunscreen liberally and repeatedly throughout the day. You should seek shade when your shadow is shorter than you. Protect yourself by wearing long sleeves, pants, a wide-brimmed hat, and sunglasses year round, whenever you are outdoors.  Once a month, do a whole body skin exam, using a mirror to look at the skin on your back. Tell your health care provider of new moles, moles that have irregular borders, moles that are larger than a pencil eraser, or moles that have changed in shape or color.  Stay current with required vaccines (immunizations).  Influenza vaccine. All adults should be immunized every year.  Tetanus, diphtheria, and acellular pertussis (Td, Tdap) vaccine. Pregnant women should receive 1 dose of  Tdap vaccine during each pregnancy. The dose should be obtained regardless of the length of time since the last dose. Immunization is preferred during the 27th-36th week of gestation. An adult who has not previously received Tdap or who does not know her vaccine status should receive 1 dose of Tdap. This initial dose should be followed by tetanus and diphtheria toxoids (Td) booster doses every 10 years. Adults with an unknown or incomplete history of completing a 3-dose immunization series with Td-containing vaccines should begin or complete a primary immunization series including a Tdap dose. Adults should receive a Td booster every 10 years.    Zoster vaccine. One dose is recommended for adults aged 60 years or older unless certain conditions are present.    Pneumococcal 13-valent conjugate (PCV13) vaccine. When indicated, a person who is uncertain of her immunization history and has no record of immunization should receive the PCV13 vaccine. An adult aged 19 years or older who has certain medical conditions and has not been previously immunized should receive 1 dose of PCV13 vaccine. This PCV13 should be followed with a dose of pneumococcal polysaccharide (PPSV23) vaccine. The PPSV23 vaccine dose should be obtained at least 8 weeks after the dose of PCV13 vaccine. An adult aged 19 years or older who has certain medical conditions and previously received 1 or more doses of PPSV23 vaccine should receive 1 dose of PCV13. The PCV13 vaccine dose should be obtained 1 or more years after the last PPSV23 vaccine dose.    Pneumococcal polysaccharide (PPSV23) vaccine. When PCV13 is also indicated, PCV13 should be obtained first. All adults aged 65 years and older should be immunized. An adult younger than age 65 years who has certain medical conditions should be immunized. Any person who resides in a nursing home or long-term care facility should be immunized. An adult smoker should be immunized. People with an  immunocompromised condition and certain other conditions should receive both PCV13 and PPSV23 vaccines. People with human immunodeficiency virus (HIV) infection should be immunized as soon as possible after diagnosis. Immunization during chemotherapy or radiation therapy should be avoided. Routine use of PPSV23 vaccine is not recommended for American Indians, [place] Natives, or people younger than 65 years unless there are medical conditions that require   PPSV23 vaccine. When indicated, people who have unknown immunization and have no record of immunization should receive PPSV23 vaccine. One-time revaccination 5 years after the first dose of PPSV23 is recommended for people aged 19-64 years who have chronic kidney failure, nephrotic syndrome, asplenia, or immunocompromised conditions. People who received 1-2 doses of PPSV23 before age 65 years should receive another dose of PPSV23 vaccine at age 65 years or later if at least 5 years have passed since the previous dose. Doses of PPSV23 are not needed for people immunized with PPSV23 at or after age 65 years.   Preventive Services / Frequency  Ages 65 years and over  Blood pressure check.  Lipid and cholesterol check.  Lung cancer screening. / Every year if you are aged 55-80 years and have a 30-pack-year history of smoking and currently smoke or have quit within the past 15 years. Yearly screening is stopped once you have quit smoking for at least 15 years or develop a health problem that would prevent you from having lung cancer treatment.  Clinical breast exam.** / Every year after age 40 years.  BRCA-related cancer risk assessment.** / For women who have family members with a BRCA-related cancer (breast, ovarian, tubal, or peritoneal cancers).  Mammogram.** / Every year beginning at age 40 years and continuing for as long as you are in good health. Consult with your health care provider.  Pap test.** / Every 3 years starting at age 30 years  through age 65 or 70 years with 3 consecutive normal Pap tests. Testing can be stopped between 65 and 70 years with 3 consecutive normal Pap tests and no abnormal Pap or HPV tests in the past 10 years.  Fecal occult blood test (FOBT) of stool. / Every year beginning at age 50 years and continuing until age 75 years. You may not need to do this test if you get a colonoscopy every 10 years.  Flexible sigmoidoscopy or colonoscopy.** / Every 5 years for a flexible sigmoidoscopy or every 10 years for a colonoscopy beginning at age 50 years and continuing until age 75 years.  Hepatitis C blood test.** / For all people born from 1945 through 1965 and any individual with known risks for hepatitis C.  Osteoporosis screening.** / A one-time screening for women ages 65 years and over and women at risk for fractures or osteoporosis.  Skin self-exam. / Monthly.  Influenza vaccine. / Every year.  Tetanus, diphtheria, and acellular pertussis (Tdap/Td) vaccine.** / 1 dose of Td every 10 years.  Zoster vaccine.** / 1 dose for adults aged 60 years or older.  Pneumococcal 13-valent conjugate (PCV13) vaccine.** / Consult your health care provider.  Pneumococcal polysaccharide (PPSV23) vaccine.** / 1 dose for all adults aged 65 years and older. Screening for abdominal aortic aneurysm (AAA)  by ultrasound is recommended for people who have history of high blood pressure or who are current or former smokers. 

## 2015-02-28 ENCOUNTER — Other Ambulatory Visit: Payer: Self-pay | Admitting: Internal Medicine

## 2015-02-28 DIAGNOSIS — N3090 Cystitis, unspecified without hematuria: Secondary | ICD-10-CM

## 2015-02-28 LAB — URINALYSIS, ROUTINE W REFLEX MICROSCOPIC
Bilirubin Urine: NEGATIVE
Glucose, UA: NEGATIVE
Hgb urine dipstick: NEGATIVE
Ketones, ur: NEGATIVE
Nitrite: NEGATIVE
Protein, ur: NEGATIVE
Specific Gravity, Urine: 1.018 (ref 1.001–1.035)
pH: 6 (ref 5.0–8.0)

## 2015-02-28 LAB — HEMOGLOBIN A1C
Hgb A1c MFr Bld: 6.4 % — ABNORMAL HIGH (ref <5.7)
Mean Plasma Glucose: 137 mg/dL — ABNORMAL HIGH (ref <117)

## 2015-02-28 LAB — URINALYSIS, MICROSCOPIC ONLY
Bacteria, UA: NONE SEEN [HPF]
Casts: NONE SEEN [LPF]
Crystals: NONE SEEN [HPF]
Yeast: NONE SEEN [HPF]

## 2015-02-28 LAB — VITAMIN D 25 HYDROXY (VIT D DEFICIENCY, FRACTURES): Vit D, 25-Hydroxy: 50 ng/mL (ref 30–100)

## 2015-02-28 LAB — INSULIN, RANDOM: Insulin: 22.3 u[IU]/mL — ABNORMAL HIGH (ref 2.0–19.6)

## 2015-02-28 LAB — TSH: TSH: 3.022 u[IU]/mL (ref 0.350–4.500)

## 2015-02-28 MED ORDER — CIPROFLOXACIN HCL 250 MG PO TABS: ORAL_TABLET | ORAL | Status: DC

## 2015-03-01 ENCOUNTER — Encounter: Payer: Self-pay | Admitting: Internal Medicine

## 2015-03-01 NOTE — Progress Notes (Signed)
Patient ID: Debra Short, female   DOB: February 02, 1941, 74 y.o.   MRN: [id number]   Comprehensive Examination  This very nice 74 y.o. [facility] presents for comprehensive examination, evaluation and examination of all of her co-morbid medical problems.  Patient has been followed for HTN, T2_NIDDM  , Hyperlipidemia, and Vitamin D Deficiency. Also patient has mixed vascular & tension HA's and is seeing Dr Orie Rout for trigger point injections in her neck. Other problems include GERD which is controlled with diet & meds.     Her HTN predates since 2012. Patient's BP has been controlled at home and patient denies any cardiac symptoms as chest pain, palpitations, shortness of breath, dizziness or ankle swelling. Today's BP: 116/80 mmHg    Patient's hyperlipidemia is controlled with diet and medications. Patient denies myalgias or other medication SE's. Today's lipids are at goal with Cholesterol 159; HDL 46; LDL Cholesterol 86;and Triglycerides 134.   Patient has T2_NIDDM predating since 2009 with A1c 5.9%. Patient was initiated on Metformin in April this year  and patient denies reactive hypoglycemic symptoms, visual blurring, diabetic polys, or paresthesias.  Today's  A1c is 6.4%.   Finally, patient has history of Vitamin D Deficiency of "25" in 2009 and last Vitamin D was 50 on 02/27/2015.  Medication Sig  . aspirin 81 MG chewable tablet Chew by mouth daily.  Marland Kitchen atorvastatin 80 MG tablet TAKE 1/4 TAB DAILY   . bisoprolol-hctz [facility]) 5-6.25 Take 1 tablet by mouth daily.  Marland Kitchen VITAMIN D 5,000 units daily on T TH S S and 10,000 units on M W F   . esomeprazole  20 MG capsule Take 20 mg by mouth 2 (two) times daily before a meal.   . GLUCERNA SHAKE Take 237 mLs by mouth 3 (three) times daily between meals.  Marland Kitchen FLUoxetine  40 MG capsule Take 1 capsule (40 mg total) by mouth daily.  Marland Kitchen gabapentin 300 MG capsule Take  2  times daily as needed   . NORCO 10-325 MG  Take 1 tab every 4  hrs as needed   . metFORMIN   XR 500 MG 24 hr tablet Take 1 tablet (500 mg total) by mouth daily with breakfast.   . montelukast  10 MG tablet TAKE 1 TABLET EVERY DAY AS NEEDED FOR ALLERGIES  . Multiple Vitamin  Take 1 tablet by mouth daily.  . rizatriptan (MAXALT) 10 MG tablet Take 10 mg by mouth daily as needed for migraine.   Marland Kitchen NASACORT nasal inhaler Place 2 sprays into the nose daily as needed (for seasonal allergies).   . zolpidem (AMBIEN) 10 MG tablet Take 0.5 tablets (5 mg total) by mouth at bedtime.  . ALPRAZolam (XANAX) 1 MG tablet Take 1 tab 2  times daily as needed for anxiety.  . celecoxib (CELEBREX) 200 MG cap Take daily as needed for mild pain.   Allergies  Allergen Reactions  . Effexor [Venlafaxine] Other (See Comments)    REACTION:  unknown  . Nortriptyline Other (See Comments)    REACTION:  unknown  . Paxil [Paroxetine Hcl] Other (See Comments)    REACTION:  unknown  . Robaxin [Methocarbamol] Itching  . Tizanidine Itching  . Zoloft [Sertraline Hcl] Other (See Comments)    REACTION: unknown   Past Medical History  Diagnosis Date  . Hyperlipidemia   . Hypertension   . GERD (gastroesophageal reflux disease)   . Vitamin D deficiency   . IBS (irritable bowel syndrome)   . Large cell lymphoma   .  Fibromyalgia    Health Maintenance  Topic Date Due  . OPHTHALMOLOGY EXAM  09/19/1948  . DEXA SCAN  09/20/2003  . URINE MICROALBUMIN  10/10/2014  . HEMOGLOBIN A1C  08/27/2015  . INFLUENZA VACCINE  01/12/2016  . FOOT EXAM  02/27/2016  . COLONOSCOPY  08/07/2021  . TETANUS/TDAP  09/25/2024  . ZOSTAVAX  Completed  . PNA vac Low Risk Adult  Completed   Immunization History  Administered Date(s) Administered  . Influenza Split 04/10/2013  . Influenza, High Dose Seasonal PF 03/24/2014, 02/27/2015  . Pneumococcal Conjugate-13 03/24/2014  . Pneumococcal Polysaccharide-23 06/13/2008  . Td 09/24/2012  . Tdap 09/26/2014  . Zoster 02/23/2011   Past Surgical History  Procedure Laterality Date  . Orif  ankle fracture Left 09/26/2014  . Abdominal hysterectomy    . Tonsillectomy    . Orif ankle fracture Left 09/26/2014    Procedure: OPEN REDUCTION INTERNAL FIXATION (ORIF) ANKLE FRACTURE;  Surgeon: [name], MD;  Location: [place];  Service: Orthopedics;  Laterality: Left;   Family History  Problem Relation Age of Onset  . Cancer Mother   . Cancer Father   . Cancer Brother   . Cancer Maternal Grandfather    Social History  Substance Use Topics  . Smoking status: Never Smoker   . Smokeless tobacco: Never Used  . Alcohol Use: No    ROS Constitutional: Denies fever, chills, weight loss/gain, headaches, insomnia,  night sweats, and change in appetite. Does c/o fatigue. Eyes: Denies redness, blurred vision, diplopia, discharge, itchy, watery eyes.  ENT: Denies discharge, congestion, post nasal drip, epistaxis, sore throat, earache, hearing loss, dental pain, Tinnitus, Vertigo, Sinus pain, snoring.  Cardio: Denies chest pain, palpitations, irregular heartbeat, syncope, dyspnea, diaphoresis, orthopnea, PND, claudication, edema Respiratory: denies cough, dyspnea, DOE, pleurisy, hoarseness, laryngitis, wheezing.  Gastrointestinal: Denies dysphagia, heartburn, reflux, water brash, pain, cramps, nausea, vomiting, bloating, diarrhea, constipation, hematemesis, melena, hematochezia, jaundice, hemorrhoids Genitourinary: Denies dysuria, frequency, urgency, nocturia, hesitancy, discharge, hematuria, flank pain Breast: Breast lumps, nipple discharge, bleeding.  Musculoskeletal: Denies arthralgia, myalgia, stiffness, Jt. Swelling, pain, limp, and strain/sprain. Denies falls. Skin: Denies puritis, rash, hives, warts, acne, eczema, changing in skin lesion Neuro: No weakness, tremor, incoordination, spasms, paresthesia, pain Psychiatric: Denies confusion, memory loss, sensory loss. Denies Depression. Does endorse ongoing problems with insomnia and desires to retry some meds for this. Endocrine: Denies  change in weight, skin, hair change, nocturia, and paresthesia, diabetic polys, visual blurring, hyper / hypo glycemic episodes.  Heme/Lymph: No excessive bleeding, bruising, enlarged lymph nodes.  Physical Exam  BP 116/80 mmHg  Pulse 80  Temp(Src) 97.9 F (36.6 C)  Resp 16  Ht 5\' 6"  (1.676 m)  Wt 160 lb 3.2 oz (72.666 kg)  BMI 25.87 kg/m2  General Appearance: Well nourished and in no apparent distress. Eyes: PERRLA, EOMs, conjunctiva no swelling or erythema, normal fundi and vessels. Sinuses: No frontal/maxillary tenderness ENT/Mouth: EACs patent / TMs  nl. Nares clear without erythema, swelling, mucoid exudates. Oral hygiene is good. No erythema, swelling, or exudate. Tongue normal, non-obstructing. Tonsils not swollen or erythematous. Hearing normal.  Neck: Supple, thyroid normal. No bruits, nodes or JVD. Respiratory: Respiratory effort normal.  BS equal and clear bilateral without rales, rhonci, wheezing or stridor. Cardio: Heart sounds are normal with regular rate and rhythm and no murmurs, rubs or gallops. Peripheral pulses are normal and equal bilaterally without edema. No aortic or femoral bruits. Chest: symmetric with normal excursions and percussion. Breasts: Symmetric, without lumps, nipple discharge, retractions, or  fibrocystic changes.  Abdomen: Flat, soft, with bowel sounds. Nontender, no guarding, rebound, hernias, masses, or organomegaly.  Lymphatics: Non tender without lymphadenopathy.  Genitourinary:  Musculoskeletal: Full ROM all peripheral extremities, joint stability, 5/5 strength, and normal gait. Skin: Warm and dry without rashes, lesions, cyanosis, clubbing or  ecchymosis.  Neuro: Cranial nerves intact, reflexes equal bilaterally. Normal muscle tone, no cerebellar symptoms. Sensation intact.  Pysch: Awake and oriented X 3, normal affect, Insight and Judgment appropriate.   Assessment and Plan  1. Essential hypertension  - EKG 12-Lead - [place], RETROPERITNL ABD,   LTD - TSH  2. Hyperlipidemia  - Lipid panel  3. Diabetes mellitus type 2, controlled  - HM DIABETES FOOT EXAM - LOW EXTREMITY NEUR EXAM DOCUM - Hemoglobin A1c - Insulin, random  4. Vitamin D deficiency  - Vit D  25 hydroxy   5. Gastroesophageal reflux disease   6. IBS (irritable bowel syndrome)   7. Screening for rectal cancer  - POC Hemoccult Bld/Stl  8. Depression screen   9. At low risk for fall   10. Encounter for long-term (current) use of medications  - Urinalysis, Routine w reflex microscopic  - CBC with Differential/Platelet - BASIC METABOLIC PANEL WITH GFR - Hepatic function panel - Magnesium  11. Insomnia  - nortriptyline (PAMELOR) 50 MG capsule; Take 1 to 2 capsules 1 hour before sleep  Dispense: 60 capsule; Refill: 5  12. Need for prophylactic vaccination and inoculation against influenza  - Flu vaccine HIGH DOSE PF (Fluzone High dose)   Continue prudent diet as discussed, weight control, BP monitoring, regular exercise, and medications. Discussed med's effects and SE's. Screening labs and tests as requested with regular follow-up as recommended.  Over 40 minutes of exam, counseling, chart review was performed.

## 2015-03-21 ENCOUNTER — Encounter: Payer: Self-pay | Admitting: *Deleted

## 2015-04-01 ENCOUNTER — Ambulatory Visit (INDEPENDENT_AMBULATORY_CARE_PROVIDER_SITE_OTHER): Payer: Medicare Other | Admitting: *Deleted

## 2015-04-01 DIAGNOSIS — N3090 Cystitis, unspecified without hematuria: Secondary | ICD-10-CM

## 2015-04-01 NOTE — Progress Notes (Signed)
Patient ID: Debra Short, female   DOB: 1941/02/14, 74 y.o.   MRN: [id number] Patient presents for recheck UA, C&S.

## 2015-04-02 LAB — URINALYSIS, MICROSCOPIC ONLY
Bacteria, UA: NONE SEEN [HPF]
Casts: NONE SEEN [LPF]
Crystals: NONE SEEN [HPF]
Yeast: NONE SEEN [HPF]

## 2015-04-02 LAB — URINALYSIS, ROUTINE W REFLEX MICROSCOPIC
Bilirubin Urine: NEGATIVE
Glucose, UA: NEGATIVE
Hgb urine dipstick: NEGATIVE
Ketones, ur: NEGATIVE
Nitrite: NEGATIVE
Protein, ur: NEGATIVE
Specific Gravity, Urine: 1.022 (ref 1.001–1.035)
pH: 5.5 (ref 5.0–8.0)

## 2015-04-02 LAB — URINE CULTURE
Colony Count: NO GROWTH
Organism ID, Bacteria: NO GROWTH

## 2015-05-06 ENCOUNTER — Other Ambulatory Visit: Payer: Self-pay | Admitting: Internal Medicine

## 2015-05-08 ENCOUNTER — Emergency Department (HOSPITAL_COMMUNITY)
Admission: EM | Admit: 2015-05-08 | Discharge: 2015-05-08 | Disposition: A | Discharge status: Home / Self Care | Payer: Medicare Other | Attending: Emergency Medicine | Admitting: Emergency Medicine

## 2015-05-08 ENCOUNTER — Encounter (HOSPITAL_COMMUNITY): Payer: Self-pay | Admitting: *Deleted

## 2015-05-08 ENCOUNTER — Emergency Department (HOSPITAL_COMMUNITY): Payer: Medicare Other

## 2015-05-08 DIAGNOSIS — S0990XA Unspecified injury of head, initial encounter: Secondary | ICD-10-CM | POA: Diagnosis not present

## 2015-05-08 DIAGNOSIS — R112 Nausea with vomiting, unspecified: Secondary | ICD-10-CM | POA: Diagnosis not present

## 2015-05-08 DIAGNOSIS — Y929 Unspecified place or not applicable: Secondary | ICD-10-CM | POA: Insufficient documentation

## 2015-05-08 DIAGNOSIS — E785 Hyperlipidemia, unspecified: Secondary | ICD-10-CM | POA: Diagnosis not present

## 2015-05-08 DIAGNOSIS — M797 Fibromyalgia: Secondary | ICD-10-CM | POA: Diagnosis not present

## 2015-05-08 DIAGNOSIS — N390 Urinary tract infection, site not specified: Secondary | ICD-10-CM | POA: Diagnosis not present

## 2015-05-08 DIAGNOSIS — Z79899 Other long term (current) drug therapy: Secondary | ICD-10-CM | POA: Diagnosis not present

## 2015-05-08 DIAGNOSIS — E871 Hypo-osmolality and hyponatremia: Secondary | ICD-10-CM | POA: Diagnosis not present

## 2015-05-08 DIAGNOSIS — R05 Cough: Secondary | ICD-10-CM | POA: Diagnosis not present

## 2015-05-08 DIAGNOSIS — E559 Vitamin D deficiency, unspecified: Secondary | ICD-10-CM | POA: Insufficient documentation

## 2015-05-08 DIAGNOSIS — Z8572 Personal history of non-Hodgkin lymphomas: Secondary | ICD-10-CM | POA: Insufficient documentation

## 2015-05-08 DIAGNOSIS — R1110 Vomiting, unspecified: Secondary | ICD-10-CM | POA: Diagnosis not present

## 2015-05-08 DIAGNOSIS — S0003XA Contusion of scalp, initial encounter: Secondary | ICD-10-CM | POA: Diagnosis not present

## 2015-05-08 DIAGNOSIS — K219 Gastro-esophageal reflux disease without esophagitis: Secondary | ICD-10-CM | POA: Diagnosis not present

## 2015-05-08 DIAGNOSIS — Y939 Activity, unspecified: Secondary | ICD-10-CM | POA: Insufficient documentation

## 2015-05-08 DIAGNOSIS — W19XXXA Unspecified fall, initial encounter: Secondary | ICD-10-CM | POA: Insufficient documentation

## 2015-05-08 DIAGNOSIS — Y999 Unspecified external cause status: Secondary | ICD-10-CM | POA: Insufficient documentation

## 2015-05-08 DIAGNOSIS — Z7982 Long term (current) use of aspirin: Secondary | ICD-10-CM | POA: Diagnosis not present

## 2015-05-08 DIAGNOSIS — I10 Essential (primary) hypertension: Secondary | ICD-10-CM | POA: Insufficient documentation

## 2015-05-08 DIAGNOSIS — K2970 Gastritis, unspecified, without bleeding: Secondary | ICD-10-CM | POA: Diagnosis not present

## 2015-05-08 DIAGNOSIS — R509 Fever, unspecified: Secondary | ICD-10-CM | POA: Diagnosis not present

## 2015-05-08 DIAGNOSIS — R1032 Left lower quadrant pain: Secondary | ICD-10-CM | POA: Diagnosis not present

## 2015-05-08 LAB — CBC WITH DIFFERENTIAL/PLATELET
Basophils Absolute: 0.1 10*3/uL (ref 0.0–0.1)
Basophils Relative: 0 %
Eosinophils Absolute: 0.1 10*3/uL (ref 0.0–0.7)
Eosinophils Relative: 1 %
HCT: 32.2 % — ABNORMAL LOW (ref 36.0–46.0)
Hemoglobin: 10 g/dL — ABNORMAL LOW (ref 12.0–15.0)
Lymphocytes Relative: 6 %
Lymphs Abs: 1.4 10*3/uL (ref 0.7–4.0)
MCH: 27 pg (ref 26.0–34.0)
MCHC: 31.1 g/dL (ref 30.0–36.0)
MCV: 86.8 fL (ref 78.0–100.0)
Monocytes Absolute: 1.7 10*3/uL — ABNORMAL HIGH (ref 0.1–1.0)
Monocytes Relative: 8 %
Neutro Abs: 19.4 10*3/uL — ABNORMAL HIGH (ref 1.7–7.7)
Neutrophils Relative %: 85 %
Platelets: 291 10*3/uL (ref 150–400)
RBC: 3.71 MIL/uL — ABNORMAL LOW (ref 3.87–5.11)
RDW: 15.8 % — ABNORMAL HIGH (ref 11.5–15.5)
WBC: 22.7 10*3/uL — ABNORMAL HIGH (ref 4.0–10.5)

## 2015-05-08 LAB — COMPREHENSIVE METABOLIC PANEL
ALT: 46 U/L (ref 14–54)
AST: 35 U/L (ref 15–41)
Albumin: 3.3 g/dL — ABNORMAL LOW (ref 3.5–5.0)
Alkaline Phosphatase: 61 U/L (ref 38–126)
Anion gap: 9 (ref 5–15)
BUN: 23 mg/dL — ABNORMAL HIGH (ref 6–20)
CO2: 25 mmol/L (ref 22–32)
Calcium: 8.4 mg/dL — ABNORMAL LOW (ref 8.9–10.3)
Chloride: 94 mmol/L — ABNORMAL LOW (ref 101–111)
Creatinine, Ser: 0.91 mg/dL (ref 0.44–1.00)
GFR calc Af Amer: >60 mL/min (ref >60)
GFR calc non Af Amer: 60 mL/min — ABNORMAL LOW (ref >60)
Glucose, Bld: 115 mg/dL — ABNORMAL HIGH (ref 65–99)
Potassium: 4.3 mmol/L (ref 3.5–5.1)
Sodium: 128 mmol/L — ABNORMAL LOW (ref 135–145)
Total Bilirubin: 0.7 mg/dL (ref 0.3–1.2)
Total Protein: 6 g/dL — ABNORMAL LOW (ref 6.5–8.1)

## 2015-05-08 LAB — URINALYSIS, ROUTINE W REFLEX MICROSCOPIC
Bilirubin Urine: NEGATIVE
Glucose, UA: NEGATIVE mg/dL
Hgb urine dipstick: NEGATIVE
Ketones, ur: NEGATIVE mg/dL
Nitrite: NEGATIVE
Protein, ur: NEGATIVE mg/dL
Specific Gravity, Urine: 1.012 (ref 1.005–1.030)
pH: 6.5 (ref 5.0–8.0)

## 2015-05-08 LAB — CBG MONITORING, ED: Glucose-Capillary: 118 mg/dL — ABNORMAL HIGH (ref 65–99)

## 2015-05-08 LAB — I-STAT TROPONIN, ED: Troponin i, poc: 0 ng/mL (ref 0.00–0.08)

## 2015-05-08 LAB — URINE MICROSCOPIC-ADD ON

## 2015-05-08 LAB — LIPASE, BLOOD: Lipase: 18 U/L (ref 11–51)

## 2015-05-08 MED ORDER — ONDANSETRON 4 MG PO TBDP: 4.0000 mg | ORAL_TABLET | Freq: Three times a day (TID) | ORAL | Status: DC | PRN

## 2015-05-08 MED ORDER — IOHEXOL 300 MG/ML  SOLN
100.0000 mL | Freq: Once | INTRAMUSCULAR | Status: AC | PRN
  Administered 2015-05-08: 100 mL via INTRAVENOUS

## 2015-05-08 MED ORDER — NITROGLYCERIN 0.4 MG SL SUBL
0.4000 mg | SUBLINGUAL_TABLET | SUBLINGUAL | Status: DC | PRN
  Administered 2015-05-08: 0.4 mg via SUBLINGUAL
  Filled 2015-05-08: qty 1

## 2015-05-08 MED ORDER — ONDANSETRON HCL 4 MG/2ML IJ SOLN
4.0000 mg | Freq: Once | INTRAMUSCULAR | Status: AC
  Administered 2015-05-08: 4 mg via INTRAVENOUS
  Filled 2015-05-08: qty 2

## 2015-05-08 MED ORDER — SODIUM CHLORIDE 0.9 % IV BOLUS (SEPSIS)
1000.0000 mL | Freq: Once | INTRAVENOUS | Status: AC
  Administered 2015-05-08: 1000 mL via INTRAVENOUS

## 2015-05-08 MED ORDER — CEPHALEXIN 500 MG PO CAPS: 500.0000 mg | ORAL_CAPSULE | Freq: Two times a day (BID) | ORAL | Status: DC

## 2015-05-08 MED ORDER — DEXTROSE 5 % IV SOLN
1.0000 g | Freq: Once | INTRAVENOUS | Status: AC
  Administered 2015-05-08: 1 g via INTRAVENOUS
  Filled 2015-05-08: qty 10

## 2015-05-08 MED ORDER — ACETAMINOPHEN 325 MG PO TABS
650.0000 mg | ORAL_TABLET | Freq: Once | ORAL | Status: AC
  Administered 2015-05-08: 650 mg via ORAL
  Filled 2015-05-08: qty 2

## 2015-05-08 MED ORDER — CEFTRIAXONE SODIUM 1 G IJ SOLR: 1.0000 g | Freq: Once | INTRAMUSCULAR | Status: DC

## 2015-05-08 NOTE — ED Provider Notes (Signed)
CSN: [id number]     Arrival date & time 05/08/15  1603 History   First MD Initiated Contact with Patient 05/08/15 1606     Chief Complaint  Patient presents with  . Nausea  . Emesis   Patient is a 74 y.o. female presenting with vomiting. The history is provided by the patient.  Emesis Severity:  Moderate Duration:  2 days Timing:  Constant Progression:  Unchanged Chronicity:  New Relieved by:  Nothing Associated symptoms: no abdominal pain, no diarrhea, no headaches and no sore throat   Risk factors: no sick contacts and no suspect food intake     Past Medical History  Diagnosis Date  . Hyperlipidemia   . Hypertension   . GERD (gastroesophageal reflux disease)   . Vitamin D deficiency   . IBS (irritable bowel syndrome)   . Large cell lymphoma ([place])   . Fibromyalgia    Past Surgical History  Procedure Laterality Date  . Orif ankle fracture Left 09/26/2014  . Abdominal hysterectomy    . Tonsillectomy    . Orif ankle fracture Left 09/26/2014    Procedure: OPEN REDUCTION INTERNAL FIXATION (ORIF) ANKLE FRACTURE;  Surgeon: [name], MD;  Location: [place];  Service: Orthopedics;  Laterality: Left;   Family History  Problem Relation Age of Onset  . Cancer Mother   . Cancer Father   . Cancer Brother   . Cancer Maternal Grandfather    Social History  Substance Use Topics  . Smoking status: Never Smoker   . Smokeless tobacco: Never Used  . Alcohol Use: No   OB History    No data available     Review of Systems  Constitutional: Positive for fever.  HENT: Negative for rhinorrhea and sore throat.   Eyes: Negative for visual disturbance.  Respiratory: Negative for chest tightness and shortness of breath.   Cardiovascular: Negative for chest pain and palpitations.  Gastrointestinal: Positive for nausea and vomiting. Negative for abdominal pain, diarrhea and constipation.  Genitourinary: Negative for dysuria and hematuria.  Musculoskeletal: Negative for back pain and  neck pain.  Skin: Negative for rash.  Neurological: Negative for dizziness and headaches.  Psychiatric/Behavioral: Negative for confusion.  All other systems reviewed and are negative.  Allergies  Effexor; Nortriptyline; Paxil; Robaxin; Tizanidine; and Zoloft  Home Medications   Prior to Admission medications   Medication Sig Start Date End Date Taking? Authorizing Provider  ALPRAZolam Duanne Moron) 1 MG tablet Take 1 tablet (1 mg total) by mouth 2 (two) times daily as needed for anxiety. 02/27/15  Yes Unk [name], MD  aspirin 81 MG chewable tablet Chew by mouth daily.   Yes Historical Provider, MD  atorvastatin (LIPITOR) 80 MG tablet TAKE 1/3 TABLET BY MOUTH DAILY FOR CHOLESTEROL. DISCONTINUE PRAVASTATIN 11/27/14  Yes Courtney Forcucci, PA-C  bisoprolol-hydrochlorothiazide (ZIAC) 5-6.25 MG tablet TAKE 1 TABLET BY MOUTH DAILY. 05/06/15  Yes [name], PA-C  Cholecalciferol (VITAMIN D PO) Take 5,000-10,000 Units by mouth daily. Takes 5,000 units daily, except takes 10,000 units on Mondays, Wednesdays, and Fridays.   Yes Historical Provider, MD  esomeprazole (NEXIUM) 20 MG capsule Take 20 mg by mouth 2 (two) times daily before a meal.    Yes Historical Provider, MD  FLUoxetine (PROZAC) 40 MG capsule Take 1 capsule (40 mg total) by mouth daily. 11/27/14  Yes Courtney Forcucci, PA-C  gabapentin (NEURONTIN) 300 MG capsule Take 300 mg by mouth 2 (two) times daily as needed (pain).  03/12/14  Yes Historical Provider, MD  HYDROcodone-acetaminophen (  NORCO) 10-325 MG per tablet Take 1 tablet by mouth every 4 (four) hours as needed for moderate pain.    Yes Historical Provider, MD  metFORMIN (GLUCOPHAGE XR) 500 MG 24 hr tablet Take 1 tablet (500 mg total) by mouth daily with breakfast. Metformin being held for 48 hours due to contrast you received on 10/31/2014. May start taking it on 11/02/2014 11/27/14  Yes Courtney Forcucci, PA-C  montelukast (SINGULAIR) 10 MG tablet TAKE 1 TABLET EVERY DAY AS NEEDED FOR  ALLERGIES 11/27/14  Yes Courtney Forcucci, PA-C  Multiple Vitamin (MULTIVITAMIN) tablet Take 1 tablet by mouth daily.   Yes Historical Provider, MD  nortriptyline (PAMELOR) 50 MG capsule Take 1 to 2 capsules 1 hour before sleep Patient taking differently: Take 50 mg by mouth at bedtime. Take 1 to 2 capsules 1 hour before sleep 02/27/15 09/27/15 Yes Unk [name], MD  rizatriptan (MAXALT) 10 MG tablet Take 10 mg by mouth daily as needed for migraine.  04/01/14  Yes Historical Provider, MD  triamcinolone (NASACORT) 55 MCG/ACT AERO nasal inhaler Place 2 sprays into the nose daily as needed (for seasonal allergies).    Yes Historical Provider, MD  zolpidem (AMBIEN) 10 MG tablet Take 0.5 tablets (5 mg total) by mouth at bedtime. 11/27/14  Yes Courtney Forcucci, PA-C  cephALEXin (KEFLEX) 500 MG capsule Take 1 capsule (500 mg total) by mouth 2 (two) times daily. 05/08/15   Gustavus Bryant, MD  ciprofloxacin (CIPRO) 250 MG tablet Take 1 tablet 2 x day with food for cystitis Patient not taking: Reported on 05/08/2015 02/28/15   Unk [name], MD  feeding supplement, GLUCERNA SHAKE, (GLUCERNA SHAKE) LIQD Take 237 mLs by mouth 3 (three) times daily between meals. Patient not taking: Reported on 05/08/2015 11/01/14   Modena Jansky, MD  ondansetron (ZOFRAN ODT) 4 MG disintegrating tablet Take 1 tablet (4 mg total) by mouth every 8 (eight) hours as needed for nausea or vomiting. 05/08/15   Gustavus Bryant, MD   BP 108/62 mmHg  Pulse 95  Temp(Src) 100.4 F (38 C) (Oral)  Resp 18  SpO2 95% Physical Exam  Constitutional: She is oriented to person, place, and time. She appears well-developed and well-nourished. No distress.  HENT:  Head: Normocephalic and atraumatic.  Mouth/Throat: Oropharynx is clear and moist.  Eyes: EOM are normal. Pupils are equal, round, and reactive to light.  Neck: Neck supple. No JVD present.  Cardiovascular: Normal rate, regular rhythm, normal heart sounds and intact distal pulses.   Exam reveals no gallop.   No murmur heard. Pulmonary/Chest: Effort normal and breath sounds normal. She has no wheezes. She has no rales.  Abdominal: Soft. She exhibits no distension. There is tenderness (mild LLQ). There is no rebound and no guarding.  Musculoskeletal: Normal range of motion. She exhibits no tenderness.  Neurological: She is alert and oriented to person, place, and time. No cranial nerve deficit or sensory deficit. She exhibits normal muscle tone. Coordination normal. GCS eye subscore is 4. GCS verbal subscore is 5. GCS motor subscore is 6.  Reflex Scores:      Bicep reflexes are 2+ on the right side and 2+ on the left side.      Brachioradialis reflexes are 2+ on the right side and 2+ on the left side.      Patellar reflexes are 2+ on the right side and 2+ on the left side. Left parietal contusion  Skin: Skin is warm and dry. No rash noted.  Psychiatric: Her behavior is normal.  ED Course  Procedures  Neon   Labs Review Labs Reviewed  CBC WITH DIFFERENTIAL/PLATELET - Abnormal; Notable for the following:    WBC 22.7 (*)    RBC 3.71 (*)    Hemoglobin 10.0 (*)    HCT 32.2 (*)    RDW 15.8 (*)    Neutro Abs 19.4 (*)    Monocytes Absolute 1.7 (*)    All other components within normal limits  COMPREHENSIVE METABOLIC PANEL - Abnormal; Notable for the following:    Sodium 128 (*)    Chloride 94 (*)    Glucose, Bld 115 (*)    BUN 23 (*)    Calcium 8.4 (*)    Total Protein 6.0 (*)    Albumin 3.3 (*)    GFR calc non Af Amer 60 (*)    All other components within normal limits  URINALYSIS, ROUTINE W REFLEX MICROSCOPIC (NOT AT [facility]) - Abnormal; Notable for the following:    Leukocytes, UA MODERATE (*)    All other components within normal limits  URINE MICROSCOPIC-ADD ON - Abnormal; Notable for the following:    Squamous Epithelial / LPF 0-5 (*)    Bacteria, UA FEW (*)    All other components within normal limits  CBG MONITORING, ED - Abnormal; Notable for the  following:    Glucose-Capillary 118 (*)    All other components within normal limits  CULTURE, BLOOD (ROUTINE X 2)  CULTURE, BLOOD (ROUTINE X 2)  LIPASE, BLOOD  I-STAT TROPOININ, ED    Imaging Review Dg Chest 2 View  05/08/2015  CLINICAL DATA:  74 year old female with productive cough and fever EXAM: CHEST  2 VIEW COMPARISON:  Prior chest x-ray 09/26/2014 FINDINGS: Stable cardiac and mediastinal contours with borderline cardiomegaly. Atherosclerotic calcifications again present in the transverse aorta. Mild patchy airspace opacities in both lung bases on the frontal view are not well seen on the lateral view and are favored to reflect superimposition of overlying soft tissues. There is no focal airspace consolidation or pleural effusion. No pulmonary edema or pneumothorax. Minimal bronchitic change. No acute osseous abnormality. IMPRESSION: Stable mild cardiomegaly. No acute cardiopulmonary disease. Electronically Signed   By: Jacqulynn Cadet M.D.   On: 05/08/2015 16:59   Ct Head Wo Contrast  05/08/2015  CLINICAL DATA:  Fall several times this week with injury to left occipital region. EXAM: CT HEAD WITHOUT CONTRAST TECHNIQUE: Contiguous axial images were obtained from the base of the skull through the vertex without intravenous contrast. COMPARISON:  MRI brain 08/08/2008 FINDINGS: Ventricles, cisterns and other CSF spaces are within normal. There is no mass, mass effect, shift of midline structures or acute hemorrhage. There is no evidence of acute infarction. Very minimal chronic ischemic microvascular disease. Likely old right nasal bone fracture. Remaining bony structures are within normal. IMPRESSION: No acute intracranial findings. Minimal chronic ischemic microvascular disease. Electronically Signed   By: Marin Olp M.D.   On: 05/08/2015 19:42   Ct Abdomen Pelvis W Contrast  05/08/2015  CLINICAL DATA:  Left lower quadrant pain radiating into the chest. Left lower quadrant pain.  History of diverticulosis and colitis. EXAM: CT ABDOMEN AND PELVIS WITH CONTRAST TECHNIQUE: Multidetector CT imaging of the abdomen and pelvis was performed using the standard protocol following bolus administration of intravenous contrast. CONTRAST:  166mL OMNIPAQUE IOHEXOL 300 MG/ML  SOLN COMPARISON:  10/31/2014 FINDINGS: There is atelectasis or infiltration in both lung bases. Diffuse fatty infiltration of the liver. The gallbladder, spleen, pancreas, adrenal glands, kidneys, inferior  vena cava, retroperitoneal lymph nodes, are unremarkable. Calcification of the aorta. Stomach and small bowel are decompressed. Diffusely stool-filled colon without distention or wall thickening. No free air or free fluid in the abdomen. Minimal umbilical hernia containing fat. Pelvis: Appendix is not identified. Diverticulosis of the sigmoid colon. No inflammatory changes to suggest diverticulitis. Bladder wall is not thickened. Uterus is surgically absent. No pelvic mass or lymphadenopathy. No free or loculated pelvic fluid collections. Degenerative changes in the lumbar spine with scoliosis convex towards the left. No destructive bone lesions. IMPRESSION: Atelectasis or infiltration in both lung bases. Diffuse fatty infiltration of the liver. Diverticulosis of the sigmoid colon without evidence of diverticulitis. No bowel obstruction or inflammation. Electronically Signed   By: Lucienne Capers M.D.   On: 05/08/2015 19:40   I have personally reviewed and evaluated these images and lab results as part of my medical decision-making.   EKG Interpretation   Date/Time:  Friday May 08 2015 16:04:17 EST Ventricular Rate:  100 PR Interval:  165 QRS Duration: 86 QT Interval:  338 QTC Calculation: 436 R Axis:   -10 Text Interpretation:  Sinus tachycardia Probable left atrial enlargement  Low voltage, precordial leads Consider anterior infarct improved ST in V1  & 2 from previous Confirmed by [facility] MD, Corene Cornea [phone]) on  05/08/2015  4:10:05 PM      MDM   Final diagnoses:  UTI (lower urinary tract infection)  Hyponatremia    Patient is a 74 year old Caucasian female. History of hypertension, hyperlipidemia and non-Hodgkin lymphoma is not currently receiving any chemotherapy or radiation presents today with 2 days of nausea. Patient also has a temp of 100.4 with rate of 100. She feels warm to touch. She is normotensive and clinically appears well. She states that this morning she was having some substernal chest pressure and felt nauseated and had emesis. She denies abdominal pain, constipation, diarrhea, urinary symptoms. Cardiac and pulmonary exams are unremarkable except for tachycardia. Tylenol given for fever. We'll obtain labs, chest x-ray, urine studies.  I independently reviewed the EKG. Sinus tachycardia, rate 100, no ectopy, no ST elevation or depression, left axis deviation, no wellens or brugada, no BBB, intervals maintained.   WBC 22k. UA concerning for UTI. Ceftriaxone given. Mild LLQ TTP on exam with h/o diverticulosis. Given fever and WBC, CT obtained to rule out complicated diverticulitis. CT without evidence of acute intraabdominal infection. CT head obtained as patient remembered she had fallen 2 days ago and has a left parietal contusion. CT unremarkable.  Patient's symptoms improving. Clinically she appears well. Hyponatremic, but after 2L NS bolus feel this is improving. Counseled on aggressive PO hydration. Instructed close PCP f/u. Patient agreeable with plan. Will d/c home with keflex and zofran.   Discussed with Dr. Dayna Barker.   Gustavus Bryant, MD 05/09/15 [id number]  Merrily Pew, MD 05/09/15 [phone]

## 2015-05-08 NOTE — ED Notes (Signed)
Per EMS: pt coming from home with c/o n/v x 2 days, productive green sputum cough, febrile of 100.4 oral. Pt A&Ox4, respirations equal and unlabored, skin warm and dry

## 2015-05-08 NOTE — ED Provider Notes (Signed)
I saw and evaluated the patient, reviewed the resident's note and I agree with the findings and plan.  74 year old female here with a couple days of vomiting up stomach contents. Also with a week of productive cough. Febrile and tachycardic prior to arrival on my examination patient is still febrile has a heart rate around 100 and is breathing around 20 times a minute. Abdomen is soft and nontender. Lungs with diminished breath sounds but no focal findings. Neuro exam grossly normal. Suspect possible pneumonia versus UTI and will start there and her infectious workup. Fluids will be administered as well.   EKG Interpretation   Date/Time:  Friday May 08 2015 16:04:17 EST Ventricular Rate:  100 PR Interval:  165 QRS Duration: 86 QT Interval:  338 QTC Calculation: 436 R Axis:   -10 Text Interpretation:  Sinus tachycardia Probable left atrial enlargement  Low voltage, precordial leads Consider anterior infarct improved ST in V1  & 2 from previous Confirmed by [facility] MD, Olegario Emberson [phone]) on 05/08/2015  4:10:05 PM        Merrily Pew, MD 05/09/15 0104

## 2015-05-08 NOTE — ED Notes (Signed)
Pt not in her room

## 2015-05-08 NOTE — ED Notes (Signed)
Patient transported to CT 

## 2015-05-08 NOTE — ED Notes (Signed)
The pt just arrived back from xray

## 2015-05-11 ENCOUNTER — Ambulatory Visit: Payer: Self-pay | Admitting: Internal Medicine

## 2015-05-12 ENCOUNTER — Ambulatory Visit (INDEPENDENT_AMBULATORY_CARE_PROVIDER_SITE_OTHER): Payer: Medicare Other | Admitting: Internal Medicine

## 2015-05-12 ENCOUNTER — Encounter: Payer: Self-pay | Admitting: Internal Medicine

## 2015-05-12 VITALS — BP 108/76 | HR 76 | Temp 97.5°F | Resp 16 | Ht 66.0 in | Wt 169.6 lb

## 2015-05-12 DIAGNOSIS — J45901 Unspecified asthma with (acute) exacerbation: Secondary | ICD-10-CM | POA: Diagnosis not present

## 2015-05-12 DIAGNOSIS — Z6827 Body mass index (BMI) 27.0-27.9, adult: Secondary | ICD-10-CM

## 2015-05-12 DIAGNOSIS — E1121 Type 2 diabetes mellitus with diabetic nephropathy: Secondary | ICD-10-CM

## 2015-05-12 MED ORDER — LEVOFLOXACIN 500 MG PO TABS: 500.0000 mg | ORAL_TABLET | Freq: Every day | ORAL | Status: DC

## 2015-05-12 MED ORDER — PROMETHAZINE-DM 6.25-15 MG/5ML PO SYRP: ORAL_SOLUTION | ORAL | Status: DC

## 2015-05-12 MED ORDER — PREDNISONE 20 MG PO TABS: ORAL_TABLET | ORAL | Status: DC

## 2015-05-12 NOTE — Progress Notes (Signed)
Subjective:    Patient ID: Debra Short, female    DOB: 1940/07/18, 74 y.o.   MRN: [id number]  HPI  This very nice 74 yo WWF as  Treated at the ER 5 days ago and had Neg CT scan of Head & Abd and Neg CXR. WBC was elevated and urine and blood cultures have returned negative. She was given iv Rocephin and empirically begun on Keflex and persists with a productive cough.   Medication Sig  . ALPRAZolam 1 MG tablet Take 1 tablet (1 mg total) by mouth 2 (two) times daily as needed for anxiety.  Marland Kitchen aspirin 81 MG  Chew by mouth daily.  Marland Kitchen atorvastatin 80 MG tablet TAKE 1/3 TABLET BY MOUTH DAILY FOR CHOLESTEROL. DISCONTINUE PRAVASTATIN  . bisoprolol-hctz / Ziac 5-6.25  TAKE 1 TABLET BY MOUTH DAILY.  Marland Kitchen VITAMIN D  Takes 5,000 units daily, except t10,000 units on MWF  . esomeprazole  20 MG capsule Take 20 mg by mouth 2 (two) times daily before a meal.   . FLUoxetine  40 MG capsule Take 1 capsule (40 mg total) by mouth daily.  Marland Kitchen gabapentin 300 MG capsule Take 300 mg by mouth 2 (two) times daily as needed (pain).   Lebron Quam 10-325  Take 1 tablet by mouth every 4 (four) hours as needed for moderate pain.   . metFORMIN-XR 500 MG  Take 1 tablet (500 mg total) by mouth daily with breakfast.   . montelukast  10 MG tablet TAKE 1 TABLET EVERY DAY AS NEEDED FOR ALLERGIES  . Multiple Vitamin  Take 1 tablet by mouth daily.  . ondansetron  ODT 4 MG  Take 1 tablet (4 mg total) by mouth every 8 (eight) hours as needed for nausea or vomiting.  . rizatriptan  10 MG tablet Take 10 mg by mouth daily as needed for migraine.   Marland Kitchen NASACORT nasal inhaler Place 2 sprays into the nose daily as needed (for seasonal allergies).   . cephALEXin500 MG cap Take 1 capsule (500 mg total) by mouth 2 (two) times daily.   Allergies  Allergen Reactions  . Effexor [Venlafaxine] Other (See Comments)    REACTION:  unknown  . Nortriptyline Other (See Comments)    REACTION:  unknown  . Paxil [Paroxetine Hcl] Other (See Comments)   REACTION:  unknown  . Robaxin [Methocarbamol] Itching  . Tizanidine Itching  . Zoloft [Sertraline Hcl] Other (See Comments)    REACTION: unknown   Past Medical History  Diagnosis Date  . Hyperlipidemia   . Hypertension   . GERD (gastroesophageal reflux disease)   . Vitamin D deficiency   . IBS (irritable bowel syndrome)   . 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 Fibromyalgia    Past Surgical History  Procedure Laterality Date  . Orif ankle fracture Left 09/26/2014  . Abdominal hysterectomy    . Tonsillectomy    . Orif ankle fracture Left 09/26/2014    Procedure: OPEN REDUCTION INTERNAL FIXATION (ORIF) ANKLE FRACTURE;  Surgeon: [name], MD;  Location: [facility];  Service: Orthopedics;  Laterality: Left;   Review of Systems  10 point systems review negative except as above.    Objective:   Physical Exam  BP 108/76 mmHg  Pulse 76  Temp(Src) 97.5 F (36.4 C)  Resp 16  Ht 5\' 6"  (1.676 m)  Wt 169 lb 9.6 oz (76.93 kg)  BMI 27.39 kg/m2  HEENT - Eac's patent. TM's Nl. EOM's full. PERRLA. NasoOroPharynx clear. Neck - supple.  Nl Thyroid. Carotids 2+ & No bruits, nodes, JVD Chest - Post tussive forced exp coasre rhonchi & wheezes and few scattered ins rales. No stridor. O2 sat 94%.  Cor - Nl HS. RRR w/o sig MGR. PP 1(+). No edema. Abd - No palpable organomegaly, masses or tenderness. BS nl. MS- FROM w/o deformities. Muscle power, tone and bulk Nl. Gait Nl. Neuro - No obvious Cr N abnormalities. Sensory, motor and Cerebellar functions appear Nl w/o focal abnormalities. Psyche - Mental status normal & appropriate.  No delusions, ideations or obvious mood abnormalities.    Assessment & Plan:   1. Asthmatic bronchitis with acute exacerbation  - predniSONE (DELTASONE) 20 MG tablet; 1 tab 3 x day for 3 days, then 1 tab 2 x day for 3 days, then 1 tab 1 x day for 5 days  Dispense: 20 tablet; Refill: 0 - levofloxacin (LEVAQUIN) 500 MG tablet; Take 1 tablet (500 mg total) by mouth daily.   Dispense: 10 tablet; Refill: 0 - promethazine-dextromethorphan (PROMETHAZINE-DM) 6.25-15 MG/5ML syrup; Take 1 to 2 tsp enery 4 hours if needed for cough  Dispense: 360 mL; Refill: 1  - Discussed meds/SE's - Recc Mucus Relief 400 mg x 2 tabs 3 x day w/meals

## 2015-05-12 NOTE — Patient Instructions (Signed)
Stop the Keflex (Cephalexin)   --------------------------------------------  Buy Mucus Relief 300 mg and   Take 2 tablets 3 x day with meals  ------------------------------------------------  Asthmatic  Bronchitis Asthmatic bronchitis is a lasting inflammation of the bronchial tubes, which are the tubes that carry air into your lungs. This is inflammation that occurs:                                                                                                                                                 When the bronchial tubes are inflamed, they start to produce mucus. The inflammation and buildup of mucus make it more difficult to breathe.  CAUSES  Bronchitis most often occurs in people who have:  Asthma.  A history of smoking. Asthma and who also smoke.  SIGNS AND SYMPTOMS  Asthmatic bronchitis may cause the following:   A cough that brings up mucus (productive cough).  Shortness of breath.  Early morning headache.  Wheezing.  Chest discomfort.   Recurring respiratory infections.   DIAGNOSIS   Your health care provider may confirm the diagnosis by:  Taking your medical history.  Performing a physical exam.  Taking a chest X-ray.    TREATMENT   Treatment involves controlling symptoms with medicines or making lifestyle changes, such as exercising and eating a healthy, well-balanced diet. Medicines could include:  Inhalers to improve air flow in and out of your lungs.  Antibiotics to treat bacterial infections, such as pneumonia, sinus infections, and acute bronchitis. As a preventative measure, your health care provider may recommend routine vaccinations for influenza and pneumonia. This is to prevent infection and hospitalization since you may be more at risk for these types of infections.   HOME CARE INSTRUCTIONS   Take medicines only as directed by your health care provider.   If you smoke cigarettes, chew tobacco, or use electronic  cigarettes, quit. If you need help quitting, ask your health care provider.  Avoid pollen, dust, animal dander, molds, smoke, and other things that cause shortness of breath or wheezing attacks.  Talk to your health care provider about possible exercise routines. Regular exercise is very important to help you feel better.  If you are prescribed oxygen use at home follow these guidelines:  Never smoke while using oxygen. Oxygen does not burn or explode, but flammable materials will burn faster in the presence of oxygen.  Keep a Data processing manager close by. Let your fire department know that you have oxygen in your home.  Warn visitors not to smoke near you when you are using oxygen. Put up "no smoking" signs in your home where you most often use the oxygen.  Regularly test your smoke detectors at home to make sure they work. If you receive care in your home from a nurse or other health care provider, he or she may also check to  make sure your smoke detectors work.  Ask your health care provider whether you would benefit from a pulmonary rehabilitation program.  Do not wait to get medical care if you have any concerning symptoms. Delays could cause permanent injury and may be life threatening.   SEEK MEDICAL CARE IF:  You have increased coughing or shortness of breath or both.  You have muscle aches.  You have chest pain.  Your mucus gets thicker.  Your mucus changes from clear or white to yellow, green, gray, or bloody. SEEK IMMEDIATE MEDICAL CARE IF:  Your usual medicines do not stop your wheezing.   You have increased difficulty breathing.   You have any problems with the medicine you are taking, such as a rash, itching, swelling, or trouble breathing. MAKE SURE YOU:   Understand these instructions.  Will watch your condition.  Will get help right away if you are not doing well or get worse.

## 2015-05-13 LAB — CULTURE, BLOOD (ROUTINE X 2)
Culture: NO GROWTH
Culture: NO GROWTH

## 2015-05-14 DIAGNOSIS — G43719 Chronic migraine without aura, intractable, without status migrainosus: Secondary | ICD-10-CM | POA: Diagnosis not present

## 2015-05-14 DIAGNOSIS — G43019 Migraine without aura, intractable, without status migrainosus: Secondary | ICD-10-CM | POA: Diagnosis not present

## 2015-05-21 ENCOUNTER — Other Ambulatory Visit: Payer: Self-pay | Admitting: Internal Medicine

## 2015-06-03 ENCOUNTER — Encounter: Payer: Self-pay | Admitting: Internal Medicine

## 2015-06-12 ENCOUNTER — Encounter: Payer: Self-pay | Admitting: *Deleted

## 2015-07-02 DIAGNOSIS — G518 Other disorders of facial nerve: Secondary | ICD-10-CM | POA: Diagnosis not present

## 2015-07-02 DIAGNOSIS — M542 Cervicalgia: Secondary | ICD-10-CM | POA: Diagnosis not present

## 2015-07-02 DIAGNOSIS — G43719 Chronic migraine without aura, intractable, without status migrainosus: Secondary | ICD-10-CM | POA: Diagnosis not present

## 2015-07-02 DIAGNOSIS — R51 Headache: Secondary | ICD-10-CM | POA: Diagnosis not present

## 2015-07-02 DIAGNOSIS — M791 Myalgia: Secondary | ICD-10-CM | POA: Diagnosis not present

## 2015-07-02 DIAGNOSIS — G43019 Migraine without aura, intractable, without status migrainosus: Secondary | ICD-10-CM | POA: Diagnosis not present

## 2015-07-30 DIAGNOSIS — R5381 Other malaise: Secondary | ICD-10-CM | POA: Diagnosis not present

## 2015-07-30 DIAGNOSIS — M542 Cervicalgia: Secondary | ICD-10-CM | POA: Diagnosis not present

## 2015-07-30 DIAGNOSIS — G4709 Other insomnia: Secondary | ICD-10-CM | POA: Diagnosis not present

## 2015-07-30 DIAGNOSIS — M797 Fibromyalgia: Secondary | ICD-10-CM | POA: Diagnosis not present

## 2015-08-06 ENCOUNTER — Other Ambulatory Visit: Payer: Self-pay | Admitting: Internal Medicine

## 2015-08-12 ENCOUNTER — Other Ambulatory Visit: Payer: Self-pay | Admitting: Internal Medicine

## 2015-08-24 ENCOUNTER — Encounter: Payer: Self-pay | Admitting: *Deleted

## 2015-09-08 ENCOUNTER — Encounter: Payer: Self-pay | Admitting: Internal Medicine

## 2015-09-08 ENCOUNTER — Ambulatory Visit (INDEPENDENT_AMBULATORY_CARE_PROVIDER_SITE_OTHER): Payer: Medicare Other | Admitting: Internal Medicine

## 2015-09-08 VITALS — BP 108/70 | HR 72 | Temp 97.5°F | Resp 16 | Ht 65.0 in | Wt 171.0 lb

## 2015-09-08 DIAGNOSIS — K219 Gastro-esophageal reflux disease without esophagitis: Secondary | ICD-10-CM | POA: Diagnosis not present

## 2015-09-08 DIAGNOSIS — N182 Chronic kidney disease, stage 2 (mild): Secondary | ICD-10-CM

## 2015-09-08 DIAGNOSIS — E1129 Type 2 diabetes mellitus with other diabetic kidney complication: Secondary | ICD-10-CM | POA: Diagnosis not present

## 2015-09-08 DIAGNOSIS — E785 Hyperlipidemia, unspecified: Secondary | ICD-10-CM

## 2015-09-08 DIAGNOSIS — E1122 Type 2 diabetes mellitus with diabetic chronic kidney disease: Secondary | ICD-10-CM

## 2015-09-08 DIAGNOSIS — I10 Essential (primary) hypertension: Secondary | ICD-10-CM | POA: Diagnosis not present

## 2015-09-08 DIAGNOSIS — F411 Generalized anxiety disorder: Secondary | ICD-10-CM

## 2015-09-08 DIAGNOSIS — G894 Chronic pain syndrome: Secondary | ICD-10-CM | POA: Diagnosis not present

## 2015-09-08 DIAGNOSIS — E559 Vitamin D deficiency, unspecified: Secondary | ICD-10-CM

## 2015-09-08 DIAGNOSIS — Z79899 Other long term (current) drug therapy: Secondary | ICD-10-CM

## 2015-09-08 LAB — LIPID PANEL
Cholesterol: 206 mg/dL — ABNORMAL HIGH (ref 125–200)
HDL: 47 mg/dL (ref >=46)
LDL Cholesterol: 128 mg/dL (ref <130)
Total CHOL/HDL Ratio: 4.4 Ratio (ref <=5.0)
Triglycerides: 154 mg/dL — ABNORMAL HIGH (ref <150)
VLDL: 31 mg/dL — ABNORMAL HIGH (ref <30)

## 2015-09-08 LAB — CBC WITH DIFFERENTIAL/PLATELET
Basophils Absolute: 0.1 10*3/uL (ref 0.0–0.1)
Basophils Relative: 1 % (ref 0–1)
Eosinophils Absolute: 0.2 10*3/uL (ref 0.0–0.7)
Eosinophils Relative: 3 % (ref 0–5)
HCT: 34.6 % — ABNORMAL LOW (ref 36.0–46.0)
Hemoglobin: 10.7 g/dL — ABNORMAL LOW (ref 12.0–15.0)
Lymphocytes Relative: 33 % (ref 12–46)
Lymphs Abs: 2 10*3/uL (ref 0.7–4.0)
MCH: 26 pg (ref 26.0–34.0)
MCHC: 30.9 g/dL (ref 30.0–36.0)
MCV: 84 fL (ref 78.0–100.0)
MPV: 9.2 fL (ref 8.6–12.4)
Monocytes Absolute: 0.7 10*3/uL (ref 0.1–1.0)
Monocytes Relative: 12 % (ref 3–12)
Neutro Abs: 3.1 10*3/uL (ref 1.7–7.7)
Neutrophils Relative %: 51 % (ref 43–77)
Platelets: 320 10*3/uL (ref 150–400)
RBC: 4.12 MIL/uL (ref 3.87–5.11)
RDW: 16.4 % — ABNORMAL HIGH (ref 11.5–15.5)
WBC: 6.1 10*3/uL (ref 4.0–10.5)

## 2015-09-08 LAB — BASIC METABOLIC PANEL WITH GFR
BUN: 18 mg/dL (ref 7–25)
CO2: 33 mmol/L — ABNORMAL HIGH (ref 20–31)
Calcium: 9.2 mg/dL (ref 8.6–10.4)
Chloride: 100 mmol/L (ref 98–110)
Creat: 0.87 mg/dL (ref 0.60–0.93)
GFR, Est African American: 75 mL/min (ref >=60)
GFR, Est Non African American: 65 mL/min (ref >=60)
Glucose, Bld: 96 mg/dL (ref 65–99)
Potassium: 4.9 mmol/L (ref 3.5–5.3)
Sodium: 142 mmol/L (ref 135–146)

## 2015-09-08 LAB — MAGNESIUM: Magnesium: 2.4 mg/dL (ref 1.5–2.5)

## 2015-09-08 LAB — HEPATIC FUNCTION PANEL
ALT: 16 U/L (ref 6–29)
AST: 16 U/L (ref 10–35)
Albumin: 3.6 g/dL (ref 3.6–5.1)
Alkaline Phosphatase: 58 U/L (ref 33–130)
Bilirubin, Direct: 0.1 mg/dL (ref <=0.2)
Indirect Bilirubin: 0.5 mg/dL (ref 0.2–1.2)
Total Bilirubin: 0.6 mg/dL (ref 0.2–1.2)
Total Protein: 6.7 g/dL (ref 6.1–8.1)

## 2015-09-08 LAB — TSH: TSH: 3.4 mIU/L

## 2015-09-08 MED ORDER — GABAPENTIN 300 MG PO CAPS: ORAL_CAPSULE | ORAL | Status: DC

## 2015-09-08 MED ORDER — ALPRAZOLAM 1 MG PO TABS: ORAL_TABLET | ORAL | Status: DC

## 2015-09-08 NOTE — Progress Notes (Signed)
Patient ID: Debra Short, female   DOB: 1940-08-29, 75 y.o.   MRN: [id number]   This very nice 75 y.o. [facility] presents for 3 month follow up with Hypertension, Hyperlipidemia, T2_NIDDM and Vitamin D Deficiency.    Patient is treated for HTN circa 2012 & BP has been controlled at home. Today's BP: 108/70 mmHg. Patient had a Negative Cardiolite scan in 2008.  Patient has had no complaints of any cardiac type chest pain, palpitations, dyspnea/orthopnea/PND, dizziness, claudication, or dependent edema.   Hyperlipidemia is controlled with diet & meds. Patient denies myalgias or other med SE's. Last Lipids were at goal with  Cholesterol 159; HDL 46; LDL 86; Triglycerides 134 on 02/27/2015.   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 She has had no symptoms of reactive hypoglycemia, diabetic polys, paresthesias or visual blurring.  Last A1c was 02/27/2015: Hgb A1c MFr Bld 6.4% on     Further, the patient also has history of Vitamin D Deficiency of "25" in 2009 and supplements vitamin D without any suspected side-effects. Last vitamin D was 50 on 02/27/2015.    Medication Sig  . ALPRAZolam (XANAX) 1 MG TAKE 1 TABLET TWICE A DAY AS NEEDED ANXIETY  . aspirin 81 MG chewable  Chew by mouth daily.  . bisoprolol-hctz(ZIAC) 5-6.25 MG TAKE 1 TABLET BY MOUTH DAILY.  Marland Kitchen VITAMIN D  TTakes 5,000 units daily, except takes 10,000 units on MWF  . esomeprazole20 MG  Take 20 mg by mouth 2 (two) times daily before a meal.   . FLUoxetine  40 MG  Take 1 capsule (40 mg total) by mouth daily.  Marland Kitchen gabapentin  300 MG ca Take 300 mg by mouth 2 (two) times daily as needed (pain).   . metFORMIN- XR 500 MG 24 h Take 1 tablet (500 mg total) by mouth daily with breakfast.   . montelukast (SINGULAIR) 10 MG  TAKE 1 TABLET EVERY DAY AS NEEDED FOR ALLERGIES  . Multiple Vitamin  Take 1 tablet by mouth daily.  . rizatriptan (MAXALT) 10 MG  Take 10 mg by  mouth daily as needed for migraine.   Marland Kitchen NASACORT nasal inhaler Place 2 sprays into the nose daily as needed (for seasonal allergies).   . NORCO 10-325 MG Take 1 tablet by mouth every 4 (four) hours as needed for moderate pain.   Marland Kitchen atorvastatin  80 MG  TAKE 1/3 TABLET BY MOUTH DAILY FOR CHOLESTEROL.  Marland Kitchen bisoprolol-hctz  (ZIAC) 5-6.25  TAKE 1 TABLET BY MOUTH DAILY.  . nortriptyline  50 MG  Take 1 to 2 capsules 1 hour before sleep   . ondansetron ODT 4 MG  Take 1 tablet (4 mg total) by mouth every 8 (eight) hours as needed for nausea or vomiting.  Marland Kitchen zolpidem (AMBIEN) 10 MG t Take 0.5 tablets (5 mg total) by mouth at bedtime.   Allergies  Allergen Reactions  . Effexor [Venlafaxine] Other (See Comments)    REACTION:  unknown  . Nortriptyline Other (See Comments)    REACTION:  unknown  . Paxil [Paroxetine Hcl] Other (See Comments)    REACTION:  unknown  . Robaxin [Methocarbamol] Itching  . Tizanidine Itching  . Zoloft [Sertraline Hcl] Other (See Comments)    REACTION: unknown   PMHx:   Past Medical History  Diagnosis Date  . Hyperlipidemia   . Hypertension   . GERD (gastroesophageal reflux disease)   .  Vitamin D deficiency   . IBS (irritable bowel syndrome)   . 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 Fibromyalgia    Immunization History  Administered Date(s) Administered  . Influenza Split 04/10/2013  . Influenza, High Dose Seasonal PF 03/24/2014, 02/27/2015  . Pneumococcal Conjugate-13 03/24/2014  . Pneumococcal Polysaccharide-23 06/13/2008  . Td 09/24/2012  . Tdap 09/26/2014  . Zoster 02/23/2011   Past Surgical History  Procedure Laterality Date  . Orif ankle fracture Left 09/26/2014  . Abdominal hysterectomy    . Tonsillectomy    . Orif ankle fracture Left 09/26/2014    Procedure: OPEN REDUCTION INTERNAL FIXATION (ORIF) ANKLE FRACTURE;  Surgeon: [name], MD;  Location: [facility];  Service: Orthopedics;  Laterality: Left;   FHx:    Reviewed / unchanged  SHx:    Reviewed /  unchanged  Systems Review:  Constitutional: Denies fever, chills, wt changes, headaches, insomnia, fatigue, night sweats, change in appetite. Eyes: Denies redness, blurred vision, diplopia, discharge, itchy, watery eyes.  ENT: Denies discharge, congestion, post nasal drip, epistaxis, sore throat, earache, hearing loss, dental pain, tinnitus, vertigo, sinus pain, snoring.  CV: Denies chest pain, palpitations, irregular heartbeat, syncope, dyspnea, diaphoresis, orthopnea, PND, claudication or edema. Respiratory: denies cough, dyspnea, DOE, pleurisy, hoarseness, laryngitis, wheezing.  Gastrointestinal: Denies dysphagia, odynophagia, heartburn, reflux, water brash, abdominal pain or cramps, nausea, vomiting, bloating, diarrhea, constipation, hematemesis, melena, hematochezia  or hemorrhoids. Genitourinary: Denies dysuria, frequency, urgency, nocturia, hesitancy, discharge, hematuria or flank pain. Musculoskeletal: Denies arthralgias, myalgias, stiffness, jt. swelling, pain, limping or strain/sprain.  Skin: Denies pruritus, rash, hives, warts, acne, eczema or change in skin lesion(s). Neuro: No weakness, tremor, incoordination, spasms, paresthesia or pain. Psychiatric: Denies confusion, memory loss or sensory loss. Endo: Denies change in weight, skin or hair change.  Heme/Lymph: No excessive bleeding, bruising or enlarged lymph nodes.  Physical Exam  BP 108/70 mmHg  Pulse 72  Temp(Src) 97.5 F (36.4 C)  Resp 16  Ht 5\' 5"  (1.651 m)  Wt 171 lb (77.565 kg)  BMI 28.46 kg/m2  Appears well nourished and in no distress. Eyes: PERRLA, EOMs, conjunctiva no swelling or erythema. Sinuses: No frontal/maxillary tenderness ENT/Mouth: EAC's clear, TM's nl w/o erythema, bulging. Nares clear w/o erythema, swelling, exudates. Oropharynx clear without erythema or exudates. Oral hygiene is good. Tongue normal, non obstructing. Hearing intact.  Neck: Supple. Thyroid nl. Car 2+/2+ without bruits, nodes or  JVD. Chest: Respirations nl with BS clear & equal w/o rales, rhonchi, wheezing or stridor.  Cor: Heart sounds normal w/ regular rate and rhythm without sig. murmurs, gallops, clicks, or rubs. Peripheral pulses normal and equal  without edema.  Abdomen: Soft & bowel sounds normal. Non-tender w/o guarding, rebound, hernias, masses, or organomegaly.  Lymphatics: Unremarkable.  Musculoskeletal: Full ROM all peripheral extremities, joint stability, 5/5 strength, and normal gait.  Skin: Warm, dry without exposed rashes, lesions or ecchymosis apparent.  Neuro: Cranial nerves intact, reflexes equal bilaterally. Sensory-motor testing grossly intact. Tendon reflexes grossly intact.  Pysch: Alert & oriented x 3.  Insight and judgement nl & appropriate. No ideations.  Assessment and Plan:  1. Essential hypertension  - TSH  2. Hyperlipidemia  - Lipid panel - TSH  3. Controlled type 2 diabetes mellitus with stage 2 chronic kidney disease, without long-term current use of insulin (HCC)  - Hemoglobin A1c - Insulin, random  4. Vitamin D deficiency  - VITAMIN D 25 Hydroxy  5. Gastroesophageal reflux disease   6. Encounter for long-term (current) use of medications  -  CBC with Differential/Platelet - BASIC METABOLIC PANEL WITH GFR - Hepatic function panel - Magnesium    Recommended regular exercise, BP monitoring, weight control, and discussed med and SE's. Recommended labs to assess and monitor clinical status. Further disposition pending results of labs. Over 30 minutes of exam, counseling, chart review was performed

## 2015-09-08 NOTE — Patient Instructions (Signed)

## 2015-09-09 LAB — HEMOGLOBIN A1C
Hgb A1c MFr Bld: 6.2 % — ABNORMAL HIGH (ref <5.7)
Mean Plasma Glucose: 131 mg/dL

## 2015-09-09 LAB — INSULIN, RANDOM: Insulin: 17.4 u[IU]/mL (ref 2.0–19.6)

## 2015-09-09 LAB — VITAMIN D 25 HYDROXY (VIT D DEFICIENCY, FRACTURES): Vit D, 25-Hydroxy: 54 ng/mL (ref 30–100)

## 2015-10-03 ENCOUNTER — Encounter (HOSPITAL_COMMUNITY): Payer: Self-pay | Admitting: Vascular Surgery

## 2015-10-03 ENCOUNTER — Inpatient Hospital Stay (HOSPITAL_COMMUNITY)
Admission: EM | Admit: 2015-10-03 | Discharge: 2015-10-06 | DRG: 690 | Disposition: A | Discharge status: Home Health | Payer: Medicare Other | Attending: Internal Medicine | Admitting: Internal Medicine

## 2015-10-03 ENCOUNTER — Emergency Department (HOSPITAL_COMMUNITY): Payer: Medicare Other

## 2015-10-03 DIAGNOSIS — G894 Chronic pain syndrome: Secondary | ICD-10-CM

## 2015-10-03 DIAGNOSIS — Z7982 Long term (current) use of aspirin: Secondary | ICD-10-CM

## 2015-10-03 DIAGNOSIS — M797 Fibromyalgia: Secondary | ICD-10-CM | POA: Diagnosis present

## 2015-10-03 DIAGNOSIS — E860 Dehydration: Secondary | ICD-10-CM | POA: Diagnosis present

## 2015-10-03 DIAGNOSIS — N182 Chronic kidney disease, stage 2 (mild): Secondary | ICD-10-CM | POA: Diagnosis present

## 2015-10-03 DIAGNOSIS — R531 Weakness: Secondary | ICD-10-CM | POA: Diagnosis not present

## 2015-10-03 DIAGNOSIS — S199XXA Unspecified injury of neck, initial encounter: Secondary | ICD-10-CM | POA: Diagnosis not present

## 2015-10-03 DIAGNOSIS — S8002XA Contusion of left knee, initial encounter: Secondary | ICD-10-CM | POA: Diagnosis not present

## 2015-10-03 DIAGNOSIS — T1490 Injury, unspecified: Secondary | ICD-10-CM | POA: Diagnosis not present

## 2015-10-03 DIAGNOSIS — Z66 Do not resuscitate: Secondary | ICD-10-CM | POA: Diagnosis present

## 2015-10-03 DIAGNOSIS — Z8572 Personal history of non-Hodgkin lymphomas: Secondary | ICD-10-CM

## 2015-10-03 DIAGNOSIS — R55 Syncope and collapse: Secondary | ICD-10-CM | POA: Diagnosis not present

## 2015-10-03 DIAGNOSIS — E559 Vitamin D deficiency, unspecified: Secondary | ICD-10-CM | POA: Diagnosis present

## 2015-10-03 DIAGNOSIS — I129 Hypertensive chronic kidney disease with stage 1 through stage 4 chronic kidney disease, or unspecified chronic kidney disease: Secondary | ICD-10-CM | POA: Diagnosis present

## 2015-10-03 DIAGNOSIS — D72829 Elevated white blood cell count, unspecified: Secondary | ICD-10-CM

## 2015-10-03 DIAGNOSIS — F411 Generalized anxiety disorder: Secondary | ICD-10-CM | POA: Diagnosis present

## 2015-10-03 DIAGNOSIS — R1111 Vomiting without nausea: Secondary | ICD-10-CM | POA: Diagnosis not present

## 2015-10-03 DIAGNOSIS — I10 Essential (primary) hypertension: Secondary | ICD-10-CM | POA: Diagnosis not present

## 2015-10-03 DIAGNOSIS — E1122 Type 2 diabetes mellitus with diabetic chronic kidney disease: Secondary | ICD-10-CM | POA: Diagnosis not present

## 2015-10-03 DIAGNOSIS — E119 Type 2 diabetes mellitus without complications: Secondary | ICD-10-CM

## 2015-10-03 DIAGNOSIS — S0990XA Unspecified injury of head, initial encounter: Secondary | ICD-10-CM | POA: Diagnosis not present

## 2015-10-03 DIAGNOSIS — F329 Major depressive disorder, single episode, unspecified: Secondary | ICD-10-CM | POA: Diagnosis present

## 2015-10-03 DIAGNOSIS — T148 Other injury of unspecified body region: Secondary | ICD-10-CM | POA: Diagnosis not present

## 2015-10-03 DIAGNOSIS — N390 Urinary tract infection, site not specified: Secondary | ICD-10-CM | POA: Diagnosis not present

## 2015-10-03 DIAGNOSIS — Z7984 Long term (current) use of oral hypoglycemic drugs: Secondary | ICD-10-CM

## 2015-10-03 DIAGNOSIS — E785 Hyperlipidemia, unspecified: Secondary | ICD-10-CM | POA: Diagnosis present

## 2015-10-03 DIAGNOSIS — K589 Irritable bowel syndrome without diarrhea: Secondary | ICD-10-CM | POA: Diagnosis present

## 2015-10-03 DIAGNOSIS — K219 Gastro-esophageal reflux disease without esophagitis: Secondary | ICD-10-CM | POA: Diagnosis present

## 2015-10-03 LAB — URINALYSIS, ROUTINE W REFLEX MICROSCOPIC
Bilirubin Urine: NEGATIVE
Glucose, UA: 250 mg/dL — AB
Ketones, ur: NEGATIVE mg/dL
Nitrite: NEGATIVE
Protein, ur: NEGATIVE mg/dL
Specific Gravity, Urine: 1.022 (ref 1.005–1.030)
pH: 6 (ref 5.0–8.0)

## 2015-10-03 LAB — CBC WITH DIFFERENTIAL/PLATELET
Basophils Absolute: 0 10*3/uL (ref 0.0–0.1)
Basophils Relative: 0 %
Eosinophils Absolute: 0 10*3/uL (ref 0.0–0.7)
Eosinophils Relative: 0 %
HCT: 38.8 % (ref 36.0–46.0)
Hemoglobin: 11.6 g/dL — ABNORMAL LOW (ref 12.0–15.0)
Lymphocytes Relative: 7 %
Lymphs Abs: 1.6 10*3/uL (ref 0.7–4.0)
MCH: 26.1 pg (ref 26.0–34.0)
MCHC: 29.9 g/dL — ABNORMAL LOW (ref 30.0–36.0)
MCV: 87.4 fL (ref 78.0–100.0)
Monocytes Absolute: 1.7 10*3/uL — ABNORMAL HIGH (ref 0.1–1.0)
Monocytes Relative: 7 %
Neutro Abs: 20.4 10*3/uL — ABNORMAL HIGH (ref 1.7–7.7)
Neutrophils Relative %: 86 %
Platelets: 277 10*3/uL (ref 150–400)
RBC: 4.44 MIL/uL (ref 3.87–5.11)
RDW: 16.2 % — ABNORMAL HIGH (ref 11.5–15.5)
WBC: 23.7 10*3/uL — ABNORMAL HIGH (ref 4.0–10.5)

## 2015-10-03 LAB — CK: Total CK: 50 U/L (ref 38–234)

## 2015-10-03 LAB — COMPREHENSIVE METABOLIC PANEL
ALT: 31 U/L (ref 14–54)
AST: 39 U/L (ref 15–41)
Albumin: 3.3 g/dL — ABNORMAL LOW (ref 3.5–5.0)
Alkaline Phosphatase: 55 U/L (ref 38–126)
Anion gap: 14 (ref 5–15)
BUN: 18 mg/dL (ref 6–20)
CO2: 28 mmol/L (ref 22–32)
Calcium: 9.4 mg/dL (ref 8.9–10.3)
Chloride: 99 mmol/L — ABNORMAL LOW (ref 101–111)
Creatinine, Ser: 0.86 mg/dL (ref 0.44–1.00)
GFR calc Af Amer: >60 mL/min (ref >60)
GFR calc non Af Amer: >60 mL/min (ref >60)
Glucose, Bld: 141 mg/dL — ABNORMAL HIGH (ref 65–99)
Potassium: 3.6 mmol/L (ref 3.5–5.1)
Sodium: 141 mmol/L (ref 135–145)
Total Bilirubin: 0.7 mg/dL (ref 0.3–1.2)
Total Protein: 6.9 g/dL (ref 6.5–8.1)

## 2015-10-03 LAB — URINE MICROSCOPIC-ADD ON

## 2015-10-03 LAB — I-STAT TROPONIN, ED: Troponin i, poc: 0.01 ng/mL (ref 0.00–0.08)

## 2015-10-03 LAB — I-STAT CG4 LACTIC ACID, ED: Lactic Acid, Venous: 1.72 mmol/L (ref 0.5–2.0)

## 2015-10-03 MED ORDER — CEFTRIAXONE SODIUM 1 G IJ SOLR
1.0000 g | Freq: Once | INTRAMUSCULAR | Status: AC
  Administered 2015-10-03: 1 g via INTRAVENOUS
  Filled 2015-10-03: qty 10

## 2015-10-03 MED ORDER — SODIUM CHLORIDE 0.9 % IV BOLUS (SEPSIS)
500.0000 mL | Freq: Once | INTRAVENOUS | Status: AC
  Administered 2015-10-03: 500 mL via INTRAVENOUS

## 2015-10-03 NOTE — ED Notes (Signed)
Pt reports to the ED following a fall. Per EMS pt got up at approximately midnight last night to use the bathroom and fell to the floor. She reports possible LOC. Pt laid on the floor on the left side until her neighbor found her at approx 17:00. She had 2 episodes of emesis. CBG 95 mg/dl. Denies pain at rest but states her left leg and shoulder are sore. Pt A&Ox4, resp e/u, and skin warm and dry.

## 2015-10-03 NOTE — ED Provider Notes (Signed)
CSN: [id number]     Arrival date & time 10/03/15  82 History   First MD Initiated Contact with Patient 10/03/15 1745     Chief Complaint  Patient presents with  . Fall     (Consider location/radiation/quality/duration/timing/severity/associated sxs/prior Treatment) HPI Patient states that she became nauseated and vomited 3 around midnight. She then slumped to the floor. She states she is unable to get up off the floor. She laid in the floor until she was found by a neighbor at 5:00 this afternoon. At any diarrhea. She complains of posterior headache and neck pain. No known head or neck trauma. She also complains of left knee soreness. Denies any visual changes. Denies any nausea. Denies abdominal pain or chest pain. No focal weakness or numbness. Past Medical History  Diagnosis Date  . Hyperlipidemia   . Hypertension   . GERD (gastroesophageal reflux disease)   . Vitamin D deficiency   . IBS (irritable bowel syndrome)   . 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 Fibromyalgia    Past Surgical History  Procedure Laterality Date  . Orif ankle fracture Left 09/26/2014  . Abdominal hysterectomy    . Tonsillectomy    . Orif ankle fracture Left 09/26/2014    Procedure: OPEN REDUCTION INTERNAL FIXATION (ORIF) ANKLE FRACTURE;  Surgeon: [name], MD;  Location: [place];  Service: Orthopedics;  Laterality: Left;   Family History  Problem Relation Age of Onset  . Cancer Mother   . Cancer Father   . Cancer Brother   . Cancer Maternal Grandfather    Social History  Substance Use Topics  . Smoking status: Never Smoker   . Smokeless tobacco: Never Used  . Alcohol Use: No   OB History    No data available     Review of Systems  Constitutional: Negative for fever and chills.  HENT: Negative for facial swelling.   Eyes: Negative for visual disturbance.  Respiratory: Negative for shortness of breath.   Cardiovascular: Negative for chest pain, palpitations and leg swelling.  Gastrointestinal:  Positive for nausea and vomiting. Negative for abdominal pain, diarrhea and constipation.  Genitourinary: Negative for dysuria, frequency and flank pain.  Musculoskeletal: Positive for myalgias, back pain, arthralgias and neck pain. Negative for neck stiffness.  Skin: Negative for rash and wound.  Neurological: Positive for headaches. Negative for dizziness, weakness, light-headedness and numbness.  All other systems reviewed and are negative.     Allergies  Effexor; Nortriptyline; Paxil; Robaxin; Tizanidine; and Zoloft  Home Medications   Prior to Admission medications   Medication Sig Start Date End Date Taking? Authorizing Provider  ALPRAZolam Duanne Moron) 1 MG tablet Take 1/2 to 1 tablet 2 to 3 x/daily as needed for anxiety 09/08/15   Unk [name], MD  aspirin 81 MG chewable tablet Chew by mouth daily.    Historical Provider, MD  atorvastatin (LIPITOR) 80 MG tablet TAKE 1/3 TABLET BY MOUTH DAILY FOR CHOLESTEROL. DISCONTINUE PRAVASTATIN Patient not taking: Reported on 09/08/2015 05/21/15   Unk [name], MD  bisoprolol-hydrochlorothiazide [facility]) 5-6.25 MG tablet TAKE 1 TABLET BY MOUTH DAILY. 08/06/15   Unk [name], MD  Cholecalciferol (VITAMIN D PO) Take 5,000-10,000 Units by mouth daily. Takes 5,000 units daily, except takes 10,000 units on Mondays, Wednesdays, and Fridays.    Historical Provider, MD  esomeprazole (NEXIUM) 20 MG capsule Take 20 mg by mouth 2 (two) times daily before a meal.     Historical Provider, MD  FLUoxetine (PROZAC) 40 MG capsule Take 1 capsule (40  mg total) by mouth daily. 11/27/14   Courtney Forcucci, PA-C  gabapentin (NEURONTIN) 300 MG capsule Take 1 capsule 4 x daily for pain 09/08/15 04/09/16  Unk [name], MD  HYDROcodone-acetaminophen (NORCO/VICODIN) 5-325 MG tablet  07/31/15   Historical Provider, MD  metFORMIN (GLUCOPHAGE XR) 500 MG 24 hr tablet Take 1 tablet (500 mg total) by mouth daily with breakfast. Metformin being held for 48 hours due to contrast  you received on 10/31/2014. May start taking it on 11/02/2014 11/27/14   Courtney Forcucci, PA-C  montelukast (SINGULAIR) 10 MG tablet TAKE 1 TABLET EVERY DAY AS NEEDED FOR ALLERGIES 05/21/15   Unk [name], MD  Multiple Vitamin (MULTIVITAMIN) tablet Take 1 tablet by mouth daily.    Historical Provider, MD  promethazine-dextromethorphan (PROMETHAZINE-DM) 6.25-15 MG/5ML syrup Take 1 to 2 tsp enery 4 hours if needed for cough 05/12/15   Unk [name], MD  rizatriptan (MAXALT) 10 MG tablet Take 10 mg by mouth daily as needed for migraine.  04/01/14   Historical Provider, MD  triamcinolone (NASACORT) 55 MCG/ACT AERO nasal inhaler Place 2 sprays into the nose daily as needed (for seasonal allergies).     Historical Provider, MD   BP 113/50 mmHg  Pulse 84  Temp(Src) 99 F (37.2 C) (Oral)  Resp 13  SpO2 95% Physical Exam  Constitutional: She is oriented to person, place, and time. She appears well-developed and well-nourished. No distress.  HENT:  Head: Normocephalic and atraumatic.  Mouth/Throat: Oropharynx is clear and moist.  Midface is stable. No periorbital or postauricular ecchymosis. Mild occipital scalp tenderness without obvious hematoma.  Eyes: EOM are normal. Pupils are equal, round, and reactive to light.  Neck: Normal range of motion. Neck supple. No JVD present.  Patient with mild diffuse midline cervical tenderness. No obvious injury or trauma.  Cardiovascular: Normal rate and regular rhythm.  Exam reveals no gallop and no friction rub.   No murmur heard. Pulmonary/Chest: Effort normal and breath sounds normal. No respiratory distress. She has no wheezes. She has no rales. She exhibits no tenderness.  Abdominal: Soft. Bowel sounds are normal. She exhibits no distension and no mass. There is no tenderness. There is no rebound and no guarding.  Musculoskeletal: Normal range of motion. She exhibits tenderness. She exhibits no edema.  Inferior thoracic midline tenderness. No midline  lumbar tenderness. No step-offs or deformity. Patient has small contusion over the anterior surface of the left patella. She has full range of motion of the left knee without ligamentous instability. Distal pulses are equal and intact. No lower extremity asymmetry, tenderness or swelling.  Neurological: She is alert and oriented to person, place, and time.  Patient is alert and oriented x3 with clear, goal oriented speech. Patient has 5/5 motor in all extremities. Sensation is intact to light touch. Patient has a normal gait and walks without assistance.  Skin: Skin is warm and dry. No rash noted. No erythema.  Psychiatric: She has a normal mood and affect. Her behavior is normal.  Nursing note and vitals reviewed.   ED Course  Procedures (including critical care time) Labs Review Labs Reviewed  CBC WITH DIFFERENTIAL/PLATELET - Abnormal; Notable for the following:    WBC 23.7 (*)    Hemoglobin 11.6 (*)    MCHC 29.9 (*)    RDW 16.2 (*)    Neutro Abs 20.4 (*)    Monocytes Absolute 1.7 (*)    All other components within normal limits  COMPREHENSIVE METABOLIC PANEL - Abnormal; Notable for the following:  Chloride 99 (*)    Glucose, Bld 141 (*)    Albumin 3.3 (*)    All other components within normal limits  URINALYSIS, ROUTINE W REFLEX MICROSCOPIC (NOT AT [facility]) - Abnormal; Notable for the following:    APPearance CLOUDY (*)    Glucose, UA 250 (*)    Hgb urine dipstick TRACE (*)    Leukocytes, UA SMALL (*)    All other components within normal limits  URINE MICROSCOPIC-ADD ON - Abnormal; Notable for the following:    Squamous Epithelial / LPF 6-30 (*)    Bacteria, UA MANY (*)    Casts HYALINE CASTS (*)    All other components within normal limits  CK  I-STAT TROPOININ, ED  I-STAT CG4 LACTIC ACID, ED    Imaging Review Dg Chest 2 View  10/03/2015  CLINICAL DATA:  Weakness and dizziness for 3 days EXAM: CHEST  2 VIEW COMPARISON:  05/08/2015 FINDINGS: Cardiac shadow is at the  upper limits of normal in size. The lungs are well aerated bilaterally. No focal infiltrate or sizable effusion is seen. No acute bony abnormality is noted. IMPRESSION: No active cardiopulmonary disease. Electronically Signed   By: Inez Catalina M.D.   On: 10/03/2015 19:53   Ct Head Wo Contrast  10/03/2015  CLINICAL DATA:  Fall at home.  Head and neck injury. EXAM: CT HEAD WITHOUT CONTRAST CT CERVICAL SPINE WITHOUT CONTRAST TECHNIQUE: Multidetector CT imaging of the head and cervical spine was performed following the standard protocol without intravenous contrast. Multiplanar CT image reconstructions of the cervical spine were also generated. COMPARISON:  Head CT 05/08/2015 FINDINGS: CT HEAD FINDINGS No intracranial hemorrhage, mass effect, or midline shift. No hydrocephalus. The basilar cisterns are patent. No evidence of territorial infarct. No intracranial fluid collection. Mild chronic small vessel ischemia. Atherosclerosis of skullbase vasculature. Calvarium is intact. Included paranasal sinuses and mastoid air cells are well aerated. Remote right nasal bone fracture. CT CERVICAL SPINE FINDINGS No acute fracture or subluxation. The dens is intact. There are no jumped or perched facets. There is diffuse disc space narrowing and endplate spurring, most prominent at C5-C6 and C6-C7. Multilevel facet arthropathy. Degenerative type anterolisthesis of C3 on C4 and C5 on C6. There is multilevel neural foraminal narrowing. No prevertebral soft tissue edema. IMPRESSION: 1.  No acute intracranial abnormality. 2. No acute fracture or subluxation of the cervical spine. There is multilevel degenerative disc disease and facet arthropathy with degenerative type anterolisthesis at C3-C4 and C5-C6. Electronically Signed   By: Jeb Levering M.D.   On: 10/03/2015 19:41   Ct Cervical Spine Wo Contrast  10/03/2015  CLINICAL DATA:  Fall at home.  Head and neck injury. EXAM: CT HEAD WITHOUT CONTRAST CT CERVICAL SPINE WITHOUT  CONTRAST TECHNIQUE: Multidetector CT imaging of the head and cervical spine was performed following the standard protocol without intravenous contrast. Multiplanar CT image reconstructions of the cervical spine were also generated. COMPARISON:  Head CT 05/08/2015 FINDINGS: CT HEAD FINDINGS No intracranial hemorrhage, mass effect, or midline shift. No hydrocephalus. The basilar cisterns are patent. No evidence of territorial infarct. No intracranial fluid collection. Mild chronic small vessel ischemia. Atherosclerosis of skullbase vasculature. Calvarium is intact. Included paranasal sinuses and mastoid air cells are well aerated. Remote right nasal bone fracture. CT CERVICAL SPINE FINDINGS No acute fracture or subluxation. The dens is intact. There are no jumped or perched facets. There is diffuse disc space narrowing and endplate spurring, most prominent at C5-C6 and C6-C7. Multilevel facet arthropathy.  Degenerative type anterolisthesis of C3 on C4 and C5 on C6. There is multilevel neural foraminal narrowing. No prevertebral soft tissue edema. IMPRESSION: 1.  No acute intracranial abnormality. 2. No acute fracture or subluxation of the cervical spine. There is multilevel degenerative disc disease and facet arthropathy with degenerative type anterolisthesis at C3-C4 and C5-C6. Electronically Signed   By: Jeb Levering M.D.   On: 10/03/2015 19:41   I have personally reviewed and evaluated these images and lab results as part of my medical decision-making.   EKG Interpretation   Date/Time:  Saturday October 03 2015 17:52:08 EDT Ventricular Rate:  82 PR Interval:  169 QRS Duration: 84 QT Interval:  415 QTC Calculation: 485 R Axis:   8 Text Interpretation:  Sinus rhythm Consider left atrial enlargement  Confirmed by Alyzah Pelly  MD, Natividad Halls ([id number]) on 10/03/2015 10:25:01 PM      MDM   Final diagnoses:  UTI (lower urinary tract infection)  Leukocytosis  Generalized weakness   CT head and cervical  spine without any acute abnormality. Patient has UTI on UA. Also prominent cytosis. IV Rocephin initiated in the emergency department. We'll also give IV fluids. Discussed with [facility] hospitalist to observation MedSurg bed.     Julianne Rice, MD 10/03/15 2225

## 2015-10-03 NOTE — H&P (Signed)
Debra Short [id number] DOB: 1940-09-24 DOA: 10/03/2015   Referring MD Lita Mains PCP: [name], MD   Outpatient Specialists:  Edrick Kins [facility], [name] Patient coming from: home  Chief Complaint: fAll unable to get up  HPI: Debra Short is a 75 y.o. female with medical history significant of fibromyalgia, irritable bowel syndrome hypertension, hyperlipidemia,  diabetes, chronic kidney disease stage 2 and a remote history of large cell lymphoma    Presented with fall that occurred last night patient reports she was snacking at night and developed nausea started to vomit while patient was getting up to use the bathroom and clean up she slid on the floor. She felt so weak she could not get a back up. NO sick contacts.  No diarrhea She laid on the floor until her neighbor found her at 5 PM. This was associated with nausea and vomiting at least 2 on arrival of EMS blood sugar was 95. Was associated headache and neck pain as patient was laying down on the Foor in a comfortable position she denies any head or neck trauma. Patient does not endorse any chest pain or shortness of breath no focal neurological deficits. She is unsure if had  loss of consciousness or not.    Regarding pertinent Chronic problems: History of hypertension usually well-controlled on bisoprolol hydrochlorothiazide followed by her primary care provider negative Cardiolite scan was done in 2008. Since 2009 patient has been diagnosed with prediabetes but that proceeded to become full-blown diabetes with complications of chronic kidney disease stage II hemoglobin A1c in November was 6.4 In May 2016 patient was admitted for colitis colonoscopy was in 2013 and supposedly was negative.   IN ER: UA was worrisome for urinary tract infection, white blood cell count elevated 23.7, slightly neutrophils Glucose 141 albumin 3.3. Hemoglobin 11.6 lactic acid 1.72 Chest x-ray show no  active cardiopulmonary disease CT of the head and neck non-acute  Hospitalist was called for admission for urinary tract infection  Review of Systems:    Pertinent positives include: nausea, vomiting, , chills, fatigue,  Constitutional:  No weight loss, night sweats, Fevers weight loss  HEENT:  No headaches, Difficulty swallowing,Tooth/dental problems,Sore throat,  No sneezing, itching, ear ache, nasal congestion, post nasal drip,  Cardio-vascular:  No chest pain, Orthopnea, PND, anasarca, dizziness, palpitations.no Bilateral lower extremity swelling  GI:  No heartburn, indigestion, abdominal pain, diarrhea, change in bowel habits, loss of appetite, melena, blood in stool, hematemesis Resp:  no shortness of breath at rest. No dyspnea on exertion, No excess mucus, no productive cough, No non-productive cough, No coughing up of blood.No change in color of mucus.No wheezing. Skin:  no rash or lesions. No jaundice GU:  no dysuria, change in color of urine, no urgency or frequency. No straining to urinate.  No flank pain.  Musculoskeletal:  No joint pain or no joint swelling. No decreased range of motion. No back pain.  Psych:  No change in mood or affect. No depression or anxiety. No memory loss.  Neuro: no localizing neurological complaints, no tingling, no weakness, no double vision, no gait abnormality, no slurred speech, no confusion  As per HPI otherwise 10 point review of systems negative.   Past Medical History: Past Medical History  Diagnosis Date  . Hyperlipidemia   . Hypertension   . GERD (gastroesophageal reflux disease)   . Vitamin D deficiency   . IBS (irritable bowel syndrome)   . Large cell lymphoma ([place])   .  Fibromyalgia    Past Surgical History  Procedure Laterality Date  . Orif ankle fracture Left 09/26/2014  . Abdominal hysterectomy    . Tonsillectomy    . Orif ankle fracture Left 09/26/2014    Procedure: OPEN REDUCTION INTERNAL FIXATION (ORIF) ANKLE  FRACTURE;  Surgeon: [name], MD;  Location: [facility];  Service: Orthopedics;  Laterality: Left;     Social History:  Ambulatory   independently   Lives at home alone,     reports that she has never smoked. She has never used smokeless tobacco. She reports that she does not drink alcohol or use illicit drugs.  Allergies:   Allergies  Allergen Reactions  . Effexor [Venlafaxine] Other (See Comments)    unknown  . Nortriptyline Other (See Comments)    REACTION:  unknown  . Paxil [Paroxetine Hcl] Other (See Comments)    REACTION:  unknown  . Robaxin [Methocarbamol] Itching  . Tizanidine Itching  . Zoloft [Sertraline Hcl] Other (See Comments)    REACTION: unknown       Family History:  Family History  Problem Relation Age of Onset  . Cancer Mother   . Cancer Father   . Cancer Brother   . Cancer Maternal Grandfather     Medications: Prior to Admission medications   Medication Sig Start Date End Date Taking? Authorizing Provider  ALPRAZolam Duanne Moron) 1 MG tablet Take 1/2 to 1 tablet 2 to 3 x/daily as needed for anxiety 09/08/15   Unk [name], MD  aspirin 81 MG chewable tablet Chew by mouth daily.    Historical Provider, MD  atorvastatin (LIPITOR) 80 MG tablet TAKE 1/3 TABLET BY MOUTH DAILY FOR CHOLESTEROL. DISCONTINUE PRAVASTATIN Patient not taking: Reported on 09/08/2015 05/21/15   Unk [name], MD  bisoprolol-hydrochlorothiazide [facility]) 5-6.25 MG tablet TAKE 1 TABLET BY MOUTH DAILY. 08/06/15   Unk [name], MD  Cholecalciferol (VITAMIN D PO) Take 5,000-10,000 Units by mouth daily. Takes 5,000 units daily, except takes 10,000 units on Mondays, Wednesdays, and Fridays.    Historical Provider, MD  esomeprazole (NEXIUM) 20 MG capsule Take 20 mg by mouth 2 (two) times daily before a meal.     Historical Provider, MD  FLUoxetine (PROZAC) 40 MG capsule Take 1 capsule (40 mg total) by mouth daily. 11/27/14   Courtney Forcucci, PA-C  gabapentin (NEURONTIN) 300 MG capsule Take 1  capsule 4 x daily for pain 09/08/15 04/09/16  Unk [name], MD  HYDROcodone-acetaminophen (NORCO/VICODIN) 5-325 MG tablet  07/31/15   Historical Provider, MD  metFORMIN (GLUCOPHAGE XR) 500 MG 24 hr tablet Take 1 tablet (500 mg total) by mouth daily with breakfast. Metformin being held for 48 hours due to contrast you received on 10/31/2014. May start taking it on 11/02/2014 11/27/14   Courtney Forcucci, PA-C  montelukast (SINGULAIR) 10 MG tablet TAKE 1 TABLET EVERY DAY AS NEEDED FOR ALLERGIES 05/21/15   Unk [name], MD  Multiple Vitamin (MULTIVITAMIN) tablet Take 1 tablet by mouth daily.    Historical Provider, MD  promethazine-dextromethorphan (PROMETHAZINE-DM) 6.25-15 MG/5ML syrup Take 1 to 2 tsp enery 4 hours if needed for cough 05/12/15   Unk [name], MD  rizatriptan (MAXALT) 10 MG tablet Take 10 mg by mouth daily as needed for migraine.  04/01/14   Historical Provider, MD  triamcinolone (NASACORT) 55 MCG/ACT AERO nasal inhaler Place 2 sprays into the nose daily as needed (for seasonal allergies).     Historical Provider, MD    Physical Exam: Patient Vitals for the past 24 hrs:  BP  Temp Temp src Pulse Resp SpO2  10/03/15 2245 (!) 100/52 mmHg - - 86 10 99 %  10/03/15 2215 (!) 113/50 mmHg - - 84 13 95 %  10/03/15 2200 (!) 106/49 mmHg - - 83 14 97 %  10/03/15 2145 (!) 105/43 mmHg - - 85 13 95 %  10/03/15 2115 (!) 107/52 mmHg - - 88 13 97 %  10/03/15 2100 (!) 108/50 mmHg - - 86 14 98 %  10/03/15 2030 (!) 102/52 mmHg - - 84 12 95 %  10/03/15 1845 (!) 110/52 mmHg - - 89 13 100 %  10/03/15 1830 (!) 116/51 mmHg - - 91 14 (!) 82 %  10/03/15 1815 (!) 105/42 mmHg - - 83 13 95 %  10/03/15 1800 (!) 117/45 mmHg - - 86 16 95 %  10/03/15 1754 (!) 107/45 mmHg 99 F (37.2 C) Oral 84 22 96 %    1. General:  in No Acute distress 2. Psychological: Alert and  Oriented 3. Head/ENT:     Dry Mucous Membranes                          Head Non traumatic, neck supple                          Normal   Dentition 4. SKIN:  decreased Skin turgor,  Skin clean Dry and intact no rash 5. Heart: Regular rate and rhythm no Murmur, Rub or gallop 6. Lungs:  Clear to auscultation bilaterally, no wheezes or crackles   7. Abdomen: Soft, non-tender, Non distended 8. Lower extremities: no clubbing, cyanosis, or edema 9. Neurologically Grossly intact, moving all 4 extremities equally 10. MSK: Normal range of motion   body mass index is unknown because there is no weight on file.  Labs on Admission:   Labs on Admission: I have personally reviewed following labs and imaging studies  CBC:  Recent Labs Lab 10/03/15 1800  WBC 23.7*  NEUTROABS 20.4*  HGB 11.6*  HCT 38.8  MCV 87.4  PLT 99991111   Basic Metabolic Panel:  Recent Labs Lab 10/03/15 1800  NA 141  K 3.6  CL 99*  CO2 28  GLUCOSE 141*  BUN 18  CREATININE 0.86  CALCIUM 9.4   GFR: CrCl cannot be calculated (Unknown ideal weight.). Liver Function Tests:  Recent Labs Lab 10/03/15 1800  AST 39  ALT 31  ALKPHOS 55  BILITOT 0.7  PROT 6.9  ALBUMIN 3.3*   No results for input(s): LIPASE, AMYLASE in the last 168 hours. No results for input(s): AMMONIA in the last 168 hours. Coagulation Profile: No results for input(s): INR, PROTIME in the last 168 hours. Cardiac Enzymes:  Recent Labs Lab 10/03/15 1800  CKTOTAL 50   BNP (last 3 results) No results for input(s): PROBNP in the last 8760 hours. HbA1C: No results for input(s): HGBA1C in the last 72 hours. CBG: No results for input(s): GLUCAP in the last 168 hours. Lipid Profile: No results for input(s): CHOL, HDL, LDLCALC, TRIG, CHOLHDL, LDLDIRECT in the last 72 hours. Thyroid Function Tests: No results for input(s): TSH, T4TOTAL, FREET4, T3FREE, THYROIDAB in the last 72 hours. Anemia Panel: No results for input(s): VITAMINB12, FOLATE, FERRITIN, TIBC, IRON, RETICCTPCT in the last 72 hours. Urine analysis:    Component Value Date/Time   COLORURINE YELLOW 10/03/2015  2039   APPEARANCEUR CLOUDY* 10/03/2015 2039   LABSPEC 1.022 10/03/2015 2039  PHURINE 6.0 10/03/2015 2039   GLUCOSEU 250* 10/03/2015 2039   HGBUR TRACE* 10/03/2015 2039   BILIRUBINUR NEGATIVE 10/03/2015 2039   KETONESUR NEGATIVE 10/03/2015 2039   PROTEINUR NEGATIVE 10/03/2015 2039   UROBILINOGEN 1.0 10/31/2014 0238   NITRITE NEGATIVE 10/03/2015 2039   LEUKOCYTESUR SMALL* 10/03/2015 2039   Sepsis Labs: @LABRCNTIP (procalcitonin:4,lacticidven:4) )No results found for this or any previous visit (from the past 240 hour(s)).    UA   evidence of UTI  Lab Results  Component Value Date   HGBA1C 6.2* 09/08/2015    CrCl cannot be calculated (Unknown ideal weight.).  BNP (last 3 results) No results for input(s): PROBNP in the last 8760 hours.   ECG REPORT  Independently reviewed Rate:80  Rhythm: Sinus rhythm ST&T Change: No acute ischemic changes  QTC 462  There were no vitals filed for this visit.   Cultures:    Component Value Date/Time   SDES BLOOD RIGHT HAND 05/08/2015 1825   SPECREQUEST BOTTLES DRAWN AEROBIC AND ANAEROBIC 5CC 05/08/2015 1825   CULT NO GROWTH 5 DAYS 05/08/2015 1825   REPTSTATUS 05/13/2015 FINAL 05/08/2015 1825     Radiological Exams on Admission: Dg Chest 2 View  10/03/2015  CLINICAL DATA:  Weakness and dizziness for 3 days EXAM: CHEST  2 VIEW COMPARISON:  05/08/2015 FINDINGS: Cardiac shadow is at the upper limits of normal in size. The lungs are well aerated bilaterally. No focal infiltrate or sizable effusion is seen. No acute bony abnormality is noted. IMPRESSION: No active cardiopulmonary disease. Electronically Signed   By: Inez Catalina M.D.   On: 10/03/2015 19:53   Ct Head Wo Contrast  10/03/2015  CLINICAL DATA:  Fall at home.  Head and neck injury. EXAM: CT HEAD WITHOUT CONTRAST CT CERVICAL SPINE WITHOUT CONTRAST TECHNIQUE: Multidetector CT imaging of the head and cervical spine was performed following the standard protocol without intravenous  contrast. Multiplanar CT image reconstructions of the cervical spine were also generated. COMPARISON:  Head CT 05/08/2015 FINDINGS: CT HEAD FINDINGS No intracranial hemorrhage, mass effect, or midline shift. No hydrocephalus. The basilar cisterns are patent. No evidence of territorial infarct. No intracranial fluid collection. Mild chronic small vessel ischemia. Atherosclerosis of skullbase vasculature. Calvarium is intact. Included paranasal sinuses and mastoid air cells are well aerated. Remote right nasal bone fracture. CT CERVICAL SPINE FINDINGS No acute fracture or subluxation. The dens is intact. There are no jumped or perched facets. There is diffuse disc space narrowing and endplate spurring, most prominent at C5-C6 and C6-C7. Multilevel facet arthropathy. Degenerative type anterolisthesis of C3 on C4 and C5 on C6. There is multilevel neural foraminal narrowing. No prevertebral soft tissue edema. IMPRESSION: 1.  No acute intracranial abnormality. 2. No acute fracture or subluxation of the cervical spine. There is multilevel degenerative disc disease and facet arthropathy with degenerative type anterolisthesis at C3-C4 and C5-C6. Electronically Signed   By: Jeb Levering M.D.   On: 10/03/2015 19:41   Ct Cervical Spine Wo Contrast  10/03/2015  CLINICAL DATA:  Fall at home.  Head and neck injury. EXAM: CT HEAD WITHOUT CONTRAST CT CERVICAL SPINE WITHOUT CONTRAST TECHNIQUE: Multidetector CT imaging of the head and cervical spine was performed following the standard protocol without intravenous contrast. Multiplanar CT image reconstructions of the cervical spine were also generated. COMPARISON:  Head CT 05/08/2015 FINDINGS: CT HEAD FINDINGS No intracranial hemorrhage, mass effect, or midline shift. No hydrocephalus. The basilar cisterns are patent. No evidence of territorial infarct. No intracranial fluid collection. Mild chronic small  vessel ischemia. Atherosclerosis of skullbase vasculature. Calvarium is  intact. Included paranasal sinuses and mastoid air cells are well aerated. Remote right nasal bone fracture. CT CERVICAL SPINE FINDINGS No acute fracture or subluxation. The dens is intact. There are no jumped or perched facets. There is diffuse disc space narrowing and endplate spurring, most prominent at C5-C6 and C6-C7. Multilevel facet arthropathy. Degenerative type anterolisthesis of C3 on C4 and C5 on C6. There is multilevel neural foraminal narrowing. No prevertebral soft tissue edema. IMPRESSION: 1.  No acute intracranial abnormality. 2. No acute fracture or subluxation of the cervical spine. There is multilevel degenerative disc disease and facet arthropathy with degenerative type anterolisthesis at C3-C4 and C5-C6. Electronically Signed   By: Jeb Levering M.D.   On: 10/03/2015 19:41    Chart has been reviewed    Assessment/Plan  75 year old female history of diabetes hypertension presents with fall possible syncope in the setting of UTI and nausea vomiting  Present on Admission:  Questionable syncope - check orthostatics, give IVF, cycle CE . Hypertension - hold given somewhat soft blood pressures initially were restarted as able  . UTI (lower urinary tract infection) continue Rocephin await results of urine culture  . 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 Dehydration -  administration of fluids check orthostatics  Other plan as per orders.  DVT prophylaxis:   Lovenox     Code Status:    DNR/DNI as per patient    Family Communication:   Family not at  Bedside    Disposition Plan:   To home once workup is complete and patient is stable   Consults called: none   Admission status:  obs    Level of care     medical floor      I have spent a total of 57 min on this admission   Andres Bantz 10/04/2015, 2:56 AM    [facility]  Pager [phone]   after 2 AM please page floor coverage PA If 7AM-7PM, please contact  the day team taking care of the patient  [email or web address]  Password [email or web address]

## 2015-10-04 ENCOUNTER — Encounter (HOSPITAL_COMMUNITY): Payer: Self-pay | Admitting: Internal Medicine

## 2015-10-04 DIAGNOSIS — E860 Dehydration: Secondary | ICD-10-CM | POA: Diagnosis present

## 2015-10-04 DIAGNOSIS — W19XXXA Unspecified fall, initial encounter: Secondary | ICD-10-CM

## 2015-10-04 DIAGNOSIS — I129 Hypertensive chronic kidney disease with stage 1 through stage 4 chronic kidney disease, or unspecified chronic kidney disease: Secondary | ICD-10-CM | POA: Diagnosis present

## 2015-10-04 DIAGNOSIS — K219 Gastro-esophageal reflux disease without esophagitis: Secondary | ICD-10-CM | POA: Diagnosis present

## 2015-10-04 DIAGNOSIS — N390 Urinary tract infection, site not specified: Secondary | ICD-10-CM | POA: Diagnosis not present

## 2015-10-04 DIAGNOSIS — I10 Essential (primary) hypertension: Secondary | ICD-10-CM

## 2015-10-04 DIAGNOSIS — K589 Irritable bowel syndrome without diarrhea: Secondary | ICD-10-CM | POA: Diagnosis present

## 2015-10-04 DIAGNOSIS — F411 Generalized anxiety disorder: Secondary | ICD-10-CM | POA: Diagnosis present

## 2015-10-04 DIAGNOSIS — E559 Vitamin D deficiency, unspecified: Secondary | ICD-10-CM | POA: Diagnosis present

## 2015-10-04 DIAGNOSIS — N182 Chronic kidney disease, stage 2 (mild): Secondary | ICD-10-CM

## 2015-10-04 DIAGNOSIS — R55 Syncope and collapse: Secondary | ICD-10-CM | POA: Diagnosis present

## 2015-10-04 DIAGNOSIS — Z7982 Long term (current) use of aspirin: Secondary | ICD-10-CM | POA: Diagnosis not present

## 2015-10-04 DIAGNOSIS — M797 Fibromyalgia: Secondary | ICD-10-CM | POA: Diagnosis present

## 2015-10-04 DIAGNOSIS — F329 Major depressive disorder, single episode, unspecified: Secondary | ICD-10-CM | POA: Diagnosis present

## 2015-10-04 DIAGNOSIS — Z8572 Personal history of non-Hodgkin lymphomas: Secondary | ICD-10-CM | POA: Diagnosis not present

## 2015-10-04 DIAGNOSIS — Z66 Do not resuscitate: Secondary | ICD-10-CM | POA: Diagnosis present

## 2015-10-04 DIAGNOSIS — Z7984 Long term (current) use of oral hypoglycemic drugs: Secondary | ICD-10-CM | POA: Diagnosis not present

## 2015-10-04 DIAGNOSIS — E1122 Type 2 diabetes mellitus with diabetic chronic kidney disease: Secondary | ICD-10-CM | POA: Diagnosis not present

## 2015-10-04 DIAGNOSIS — E785 Hyperlipidemia, unspecified: Secondary | ICD-10-CM | POA: Diagnosis present

## 2015-10-04 LAB — TROPONIN I
Troponin I: <0.03 ng/mL (ref <0.031)
Troponin I: 0.07 ng/mL — ABNORMAL HIGH (ref <0.031)
Troponin I: 0.04 ng/mL — ABNORMAL HIGH (ref <0.031)

## 2015-10-04 LAB — GLUCOSE, CAPILLARY
Glucose-Capillary: 93 mg/dL (ref 65–99)
Glucose-Capillary: 79 mg/dL (ref 65–99)
Glucose-Capillary: 119 mg/dL — ABNORMAL HIGH (ref 65–99)
Glucose-Capillary: 107 mg/dL — ABNORMAL HIGH (ref 65–99)
Glucose-Capillary: 130 mg/dL — ABNORMAL HIGH (ref 65–99)

## 2015-10-04 LAB — CBC
HCT: 31 % — ABNORMAL LOW (ref 36.0–46.0)
Hemoglobin: 9.7 g/dL — ABNORMAL LOW (ref 12.0–15.0)
MCH: 27.5 pg (ref 26.0–34.0)
MCHC: 31.3 g/dL (ref 30.0–36.0)
MCV: 87.8 fL (ref 78.0–100.0)
Platelets: 246 10*3/uL (ref 150–400)
RBC: 3.53 MIL/uL — ABNORMAL LOW (ref 3.87–5.11)
RDW: 16.8 % — ABNORMAL HIGH (ref 11.5–15.5)
WBC: 20 10*3/uL — ABNORMAL HIGH (ref 4.0–10.5)

## 2015-10-04 LAB — COMPREHENSIVE METABOLIC PANEL
ALT: 26 U/L (ref 14–54)
AST: 24 U/L (ref 15–41)
Albumin: 2.7 g/dL — ABNORMAL LOW (ref 3.5–5.0)
Alkaline Phosphatase: 42 U/L (ref 38–126)
Anion gap: 10 (ref 5–15)
BUN: 16 mg/dL (ref 6–20)
CO2: 27 mmol/L (ref 22–32)
Calcium: 8.4 mg/dL — ABNORMAL LOW (ref 8.9–10.3)
Chloride: 103 mmol/L (ref 101–111)
Creatinine, Ser: 0.66 mg/dL (ref 0.44–1.00)
GFR calc Af Amer: >60 mL/min (ref >60)
GFR calc non Af Amer: >60 mL/min (ref >60)
Glucose, Bld: 106 mg/dL — ABNORMAL HIGH (ref 65–99)
Potassium: 3.7 mmol/L (ref 3.5–5.1)
Sodium: 140 mmol/L (ref 135–145)
Total Bilirubin: 0.8 mg/dL (ref 0.3–1.2)
Total Protein: 5.9 g/dL — ABNORMAL LOW (ref 6.5–8.1)

## 2015-10-04 LAB — MAGNESIUM: Magnesium: 2 mg/dL (ref 1.7–2.4)

## 2015-10-04 LAB — TSH: TSH: 0.924 u[IU]/mL (ref 0.350–4.500)

## 2015-10-04 LAB — PHOSPHORUS: Phosphorus: 2.8 mg/dL (ref 2.5–4.6)

## 2015-10-04 MED ORDER — GABAPENTIN 300 MG PO CAPS
300.0000 mg | ORAL_CAPSULE | Freq: Three times a day (TID) | ORAL | Status: DC
  Administered 2015-10-04 – 2015-10-06 (×8): 300 mg via ORAL
  Filled 2015-10-04 (×8): qty 1

## 2015-10-04 MED ORDER — INSULIN ASPART 100 UNIT/ML ~~LOC~~ SOLN: 0.0000 [IU] | Freq: Every day | SUBCUTANEOUS | Status: DC

## 2015-10-04 MED ORDER — ACETAMINOPHEN 650 MG RE SUPP: 650.0000 mg | Freq: Four times a day (QID) | RECTAL | Status: DC | PRN

## 2015-10-04 MED ORDER — SODIUM CHLORIDE 0.9% FLUSH
3.0000 mL | Freq: Two times a day (BID) | INTRAVENOUS | Status: DC
  Administered 2015-10-04 – 2015-10-06 (×5): 3 mL via INTRAVENOUS

## 2015-10-04 MED ORDER — MONTELUKAST SODIUM 10 MG PO TABS
10.0000 mg | ORAL_TABLET | Freq: Every day | ORAL | Status: DC
  Administered 2015-10-04 – 2015-10-05 (×3): 10 mg via ORAL
  Filled 2015-10-04 (×3): qty 1

## 2015-10-04 MED ORDER — ENOXAPARIN SODIUM 40 MG/0.4ML ~~LOC~~ SOLN
40.0000 mg | SUBCUTANEOUS | Status: DC
  Administered 2015-10-04 – 2015-10-06 (×3): 40 mg via SUBCUTANEOUS
  Filled 2015-10-04 (×3): qty 0.4

## 2015-10-04 MED ORDER — ATORVASTATIN CALCIUM 40 MG PO TABS
40.0000 mg | ORAL_TABLET | Freq: Every day | ORAL | Status: DC
  Administered 2015-10-05: 40 mg via ORAL
  Filled 2015-10-04 (×2): qty 1

## 2015-10-04 MED ORDER — SODIUM CHLORIDE 0.9 % IV SOLN
INTRAVENOUS | Status: AC
  Administered 2015-10-04: 01:00:00 via INTRAVENOUS

## 2015-10-04 MED ORDER — CETYLPYRIDINIUM CHLORIDE 0.05 % MT LIQD
7.0000 mL | Freq: Two times a day (BID) | OROMUCOSAL | Status: DC
  Administered 2015-10-04 – 2015-10-06 (×6): 7 mL via OROMUCOSAL

## 2015-10-04 MED ORDER — ONDANSETRON HCL 4 MG/2ML IJ SOLN: 4.0000 mg | Freq: Four times a day (QID) | INTRAMUSCULAR | Status: DC | PRN

## 2015-10-04 MED ORDER — ALPRAZOLAM 0.5 MG PO TABS
0.5000 mg | ORAL_TABLET | Freq: Three times a day (TID) | ORAL | Status: DC | PRN
  Administered 2015-10-04 (×2): 0.5 mg via ORAL
  Filled 2015-10-04 (×3): qty 1

## 2015-10-04 MED ORDER — ASPIRIN 81 MG PO CHEW
81.0000 mg | CHEWABLE_TABLET | Freq: Every day | ORAL | Status: DC
  Administered 2015-10-04 – 2015-10-06 (×3): 81 mg via ORAL
  Filled 2015-10-04 (×3): qty 1

## 2015-10-04 MED ORDER — PANTOPRAZOLE SODIUM 40 MG PO TBEC
40.0000 mg | DELAYED_RELEASE_TABLET | Freq: Every day | ORAL | Status: DC
  Administered 2015-10-04 – 2015-10-06 (×3): 40 mg via ORAL
  Filled 2015-10-04 (×3): qty 1

## 2015-10-04 MED ORDER — ONDANSETRON HCL 4 MG PO TABS: 4.0000 mg | ORAL_TABLET | Freq: Four times a day (QID) | ORAL | Status: DC | PRN

## 2015-10-04 MED ORDER — FLUOXETINE HCL 20 MG PO CAPS
40.0000 mg | ORAL_CAPSULE | Freq: Every day | ORAL | Status: DC
  Administered 2015-10-04 – 2015-10-06 (×3): 40 mg via ORAL
  Filled 2015-10-04 (×3): qty 2

## 2015-10-04 MED ORDER — CEFTRIAXONE SODIUM 1 G IJ SOLR
1.0000 g | INTRAMUSCULAR | Status: DC
  Administered 2015-10-04 – 2015-10-05 (×2): 1 g via INTRAVENOUS
  Filled 2015-10-04 (×3): qty 10

## 2015-10-04 MED ORDER — ACETAMINOPHEN 325 MG PO TABS
650.0000 mg | ORAL_TABLET | Freq: Four times a day (QID) | ORAL | Status: DC | PRN
  Administered 2015-10-05 – 2015-10-06 (×2): 650 mg via ORAL
  Filled 2015-10-04 (×2): qty 2

## 2015-10-04 MED ORDER — INSULIN ASPART 100 UNIT/ML ~~LOC~~ SOLN: 0.0000 [IU] | Freq: Three times a day (TID) | SUBCUTANEOUS | Status: DC

## 2015-10-04 NOTE — Progress Notes (Signed)
PROGRESS NOTE        PATIENT DETAILS Name: Debra Short Age: 75 y.o. Sex: female Date of Birth: [birth year] Admit Date: 10/03/2015 Admitting Physician Toy Baker, MD [id number] DAVID, MD Outpatient Specialists:  Brief Narrative: Patient is a 75 y.o. female fibromyalgia, irritable bowel syndrome hypertension, hyperlipidemia, diabetes, chronic kidney disease stage 2 and a remote history of large cell lymphoma admitted with a fall following nausea and vomiting. Not clear if patient had a syncopal episode, following the fall-patient was very weak and was unable to get up. She was found later in the day by a neighbor and brought to the emergency room. Subsequently found to have white count of 20,000 and admitted for further evaluation and treatment  Subjective: Feels better. Does acknowledge dysuria and frequency of urination over the past few days.  Assessment/Plan: Active Problems: Fall: After nausea with vomiting-not sure if she syncopized-very poor historian. Could've had orthostatic mechanisms as well as-however orthostatic vital signs were negative this morning. No major arrhythmias on telemetry. PT evaluation Follow closely.  Leukocytosis: Afebrile-chest x-ray negative-UA somewhat equivocal for UTI-but patient does acknowledge dysuria and frequency of urination. Continue Rocephin and follow cultures.  Type 2 diabetes: CBG stable with SSI, resume metformin on discharge  Hypertension: Controlled-bisoprolol/HCTZ currently on hold. Resume in the next few days.  Dyslipidemia: Continue statin  Depression/anxiety: Continue Prozac and Xanax  History of fibromyalgia: Supportive care  DVT Prophylaxis: Prophylactic Lovenox   Code Status: Full code   Family Communication: Son at bedside  Disposition Plan: Remain inpatient- Home health vs SNF on discharge-await PT eval  Antimicrobial agents: IV Rocephin  4/23>>  Procedures: None  CONSULTS:  None  Time spent: 25 minutes-Greater than 50% of this time was spent in counseling, explanation of diagnosis, planning of further management, and coordination of care.  MEDICATIONS: Anti-infectives    Start     Dose/Rate Route Frequency Ordered Stop   10/04/15 2200  cefTRIAXone (ROCEPHIN) 1 g in dextrose 5 % 50 mL IVPB     1 g 100 mL/hr over 30 Minutes Intravenous Every 24 hours 10/04/15 0014     10/03/15 2130  cefTRIAXone (ROCEPHIN) 1 g in dextrose 5 % 50 mL IVPB     1 g 100 mL/hr over 30 Minutes Intravenous  Once 10/03/15 2119 10/03/15 2223      Scheduled Meds: . antiseptic oral rinse  7 mL Mouth Rinse BID  . aspirin  81 mg Oral Daily  . cefTRIAXone (ROCEPHIN) IVPB 1 gram/50 mL D5W  1 g Intravenous Q24H  . enoxaparin (LOVENOX) injection  40 mg Subcutaneous Q24H  . FLUoxetine  40 mg Oral Daily  . gabapentin  300 mg Oral TID  . insulin aspart  0-5 Units Subcutaneous QHS  . insulin aspart  0-9 Units Subcutaneous TID WC  . montelukast  10 mg Oral QHS  . pantoprazole  40 mg Oral Daily  . sodium chloride flush  3 mL Intravenous Q12H   Continuous Infusions:  PRN Meds:.acetaminophen **OR** acetaminophen, ALPRAZolam, ondansetron **OR** ondansetron (ZOFRAN) IV   PHYSICAL EXAM: Vital signs: Filed Vitals:   10/03/15 2215 10/03/15 2245 10/03/15 2337 10/04/15 0705  BP: 113/50 100/52  115/41  Pulse: 84 86  80  Temp:    98.9 F (37.2 C)  TempSrc:    Oral  Resp: 13 10  16   Height:   5'  6" (1.676 m)   Weight:   81.511 kg (179 lb 11.2 oz)   SpO2: 95% 99%  96%   Filed Weights   10/03/15 2337  Weight: 81.511 kg (179 lb 11.2 oz)   Body mass index is 29.02 kg/(m^2).   Gen Exam: Awake and alert with clear speech. Not in any distress  Neck: Supple, No JVD.   Chest: B/L Clear.   CVS: S1 S2 Regular, no murmurs.  Abdomen: soft, BS +, non tender, non distended.  Extremities: no edema, lower extremities warm to touch. Neurologic: Non Focal.    Skin: No Rash or lesions   Wounds: N/A.   LABORATORY DATA: CBC:  Recent Labs Lab 10/03/15 1800 10/04/15 0352  WBC 23.7* 20.0*  NEUTROABS 20.4*  --   HGB 11.6* 9.7*  HCT 38.8 31.0*  MCV 87.4 87.8  PLT 277 0000000    Basic Metabolic Panel:  Recent Labs Lab 10/03/15 1800 10/04/15 0352  NA 141 140  K 3.6 3.7  CL 99* 103  CO2 28 27  GLUCOSE 141* 106*  BUN 18 16  CREATININE 0.86 0.66  CALCIUM 9.4 8.4*  MG  --  2.0  PHOS  --  2.8    GFR: Estimated Creatinine Clearance: 63.4 mL/min (by C-G formula based on Cr of 0.66).  Liver Function Tests:  Recent Labs Lab 10/03/15 1800 10/04/15 0352  AST 39 24  ALT 31 26  ALKPHOS 55 42  BILITOT 0.7 0.8  PROT 6.9 5.9*  ALBUMIN 3.3* 2.7*   No results for input(s): LIPASE, AMYLASE in the last 168 hours. No results for input(s): AMMONIA in the last 168 hours.  Coagulation Profile: No results for input(s): INR, PROTIME in the last 168 hours.  Cardiac Enzymes:  Recent Labs Lab 10/03/15 1800 10/04/15 0352 10/04/15 1042  CKTOTAL 50  --   --   TROPONINI  --  <0.03 0.07*    BNP (last 3 results) No results for input(s): PROBNP in the last 8760 hours.  HbA1C: No results for input(s): HGBA1C in the last 72 hours.  CBG:  Recent Labs Lab 10/04/15 0106 10/04/15 0812 10/04/15 1224  GLUCAP 93 79 119*    Lipid Profile: No results for input(s): CHOL, HDL, LDLCALC, TRIG, CHOLHDL, LDLDIRECT in the last 72 hours.  Thyroid Function Tests:  Recent Labs  10/04/15 0352  TSH 0.924    Anemia Panel: No results for input(s): VITAMINB12, FOLATE, FERRITIN, TIBC, IRON, RETICCTPCT in the last 72 hours.  Urine analysis:    Component Value Date/Time   COLORURINE YELLOW 10/03/2015 2039   APPEARANCEUR CLOUDY* 10/03/2015 2039   LABSPEC 1.022 10/03/2015 2039   PHURINE 6.0 10/03/2015 2039   GLUCOSEU 250* 10/03/2015 2039   HGBUR TRACE* 10/03/2015 2039   BILIRUBINUR NEGATIVE 10/03/2015 2039   KETONESUR NEGATIVE 10/03/2015  2039   PROTEINUR NEGATIVE 10/03/2015 2039   UROBILINOGEN 1.0 10/31/2014 0238   NITRITE NEGATIVE 10/03/2015 2039   LEUKOCYTESUR SMALL* 10/03/2015 2039    Sepsis Labs: Lactic Acid, Venous    Component Value Date/Time   LATICACIDVEN 1.72 10/03/2015 2241    MICROBIOLOGY: No results found for this or any previous visit (from the past 240 hour(s)).  RADIOLOGY STUDIES/RESULTS: Dg Chest 2 View  10/03/2015  CLINICAL DATA:  Weakness and dizziness for 3 days EXAM: CHEST  2 VIEW COMPARISON:  05/08/2015 FINDINGS: Cardiac shadow is at the upper limits of normal in size. The lungs are well aerated bilaterally. No focal infiltrate or sizable effusion is seen. No acute  bony abnormality is noted. IMPRESSION: No active cardiopulmonary disease. Electronically Signed   By: Inez Catalina M.D.   On: 10/03/2015 19:53   Ct Head Wo Contrast  10/03/2015  CLINICAL DATA:  Fall at home.  Head and neck injury. EXAM: CT HEAD WITHOUT CONTRAST CT CERVICAL SPINE WITHOUT CONTRAST TECHNIQUE: Multidetector CT imaging of the head and cervical spine was performed following the standard protocol without intravenous contrast. Multiplanar CT image reconstructions of the cervical spine were also generated. COMPARISON:  Head CT 05/08/2015 FINDINGS: CT HEAD FINDINGS No intracranial hemorrhage, mass effect, or midline shift. No hydrocephalus. The basilar cisterns are patent. No evidence of territorial infarct. No intracranial fluid collection. Mild chronic small vessel ischemia. Atherosclerosis of skullbase vasculature. Calvarium is intact. Included paranasal sinuses and mastoid air cells are well aerated. Remote right nasal bone fracture. CT CERVICAL SPINE FINDINGS No acute fracture or subluxation. The dens is intact. There are no jumped or perched facets. There is diffuse disc space narrowing and endplate spurring, most prominent at C5-C6 and C6-C7. Multilevel facet arthropathy. Degenerative type anterolisthesis of C3 on C4 and C5 on C6.  There is multilevel neural foraminal narrowing. No prevertebral soft tissue edema. IMPRESSION: 1.  No acute intracranial abnormality. 2. No acute fracture or subluxation of the cervical spine. There is multilevel degenerative disc disease and facet arthropathy with degenerative type anterolisthesis at C3-C4 and C5-C6. Electronically Signed   By: Jeb Levering M.D.   On: 10/03/2015 19:41   Ct Cervical Spine Wo Contrast  10/03/2015  CLINICAL DATA:  Fall at home.  Head and neck injury. EXAM: CT HEAD WITHOUT CONTRAST CT CERVICAL SPINE WITHOUT CONTRAST TECHNIQUE: Multidetector CT imaging of the head and cervical spine was performed following the standard protocol without intravenous contrast. Multiplanar CT image reconstructions of the cervical spine were also generated. COMPARISON:  Head CT 05/08/2015 FINDINGS: CT HEAD FINDINGS No intracranial hemorrhage, mass effect, or midline shift. No hydrocephalus. The basilar cisterns are patent. No evidence of territorial infarct. No intracranial fluid collection. Mild chronic small vessel ischemia. Atherosclerosis of skullbase vasculature. Calvarium is intact. Included paranasal sinuses and mastoid air cells are well aerated. Remote right nasal bone fracture. CT CERVICAL SPINE FINDINGS No acute fracture or subluxation. The dens is intact. There are no jumped or perched facets. There is diffuse disc space narrowing and endplate spurring, most prominent at C5-C6 and C6-C7. Multilevel facet arthropathy. Degenerative type anterolisthesis of C3 on C4 and C5 on C6. There is multilevel neural foraminal narrowing. No prevertebral soft tissue edema. IMPRESSION: 1.  No acute intracranial abnormality. 2. No acute fracture or subluxation of the cervical spine. There is multilevel degenerative disc disease and facet arthropathy with degenerative type anterolisthesis at C3-C4 and C5-C6. Electronically Signed   By: Jeb Levering M.D.   On: 10/03/2015 19:41       Oren Binet,  MD  [facility] Pager:[phone]  If 7PM-7AM, please contact night-coverage [email or web address] Password [email or web address] 10/04/2015, 2:05 PM

## 2015-10-04 NOTE — Evaluation (Signed)
Physical Therapy Evaluation Patient Details Name: Debra Short MRN: [id number] DOB: [birth year] Today's Date: 10/04/2015   History of Present Illness  75 y.o. female admitted following an episode of N&V and a fall. She laid on the floor for several hours before being found by a neighbor. PMH consists of fibromyalgia, irritable bowel syndrome hypertension, hyperlipidemia, diabetes, chronic kidney disease stage 2, a remote history of large cell lymphoma, L ankle ORIF 09/2014.   Clinical Impression  Pt is independent with all functional mobility. Supervision provided for ambulation on eval for safety only. Spoke with pt about returning to participation in the silver sneakers program for exercise. No further PT intervention indicated. PT signing off.    Follow Up Recommendations No PT follow up;Supervision - Intermittent (Pt would benefit from lifeline or the like.)    Equipment Recommendations  None recommended by PT    Recommendations for Other Services       Precautions / Restrictions        Mobility  Bed Mobility Overal bed mobility: Independent                Transfers Overall transfer level: Independent Equipment used: None                Ambulation/Gait Ambulation/Gait assistance: Supervision Ambulation Distance (Feet): 1000 Feet Assistive device: None Gait Pattern/deviations: WFL(Within Functional Limits)   Gait velocity interpretation: at or above normal speed for age/gender General Gait Details: steady gait, supervision for safety only  Stairs            Wheelchair Mobility    Modified Rankin (Stroke Patients Only)       Balance Overall balance assessment: History of Falls Sitting-balance support: No upper extremity supported;Feet supported Sitting balance-Leahy Scale: Normal     Standing balance support: No upper extremity supported;During functional activity Standing balance-Leahy Scale: Good                                Pertinent Vitals/Pain Pain Assessment: Faces Pain Location: "i'm just a little sore and achy all over from being on the floor for a while." Pain Intervention(s): Monitored during session    Home Living Family/patient expects to be discharged to:: Private residence Living Arrangements: Alone Available Help at Discharge: Family;Friend(s);Available PRN/intermittently Type of Home: House Home Access: Stairs to enter   Entrance Stairs-Number of Steps: 2 Home Layout: One level Home Equipment: Walker - 2 wheels;Wheelchair - manual;Bedside commode      Prior Function Level of Independence: Independent         Comments: Pt lives alone and maintains her house independently, except for yard work. She still drives.     Hand Dominance        Extremity/Trunk Assessment   Upper Extremity Assessment: Overall WFL for tasks assessed           Lower Extremity Assessment: Overall WFL for tasks assessed      Cervical / Trunk Assessment: Normal  Communication   Communication: No difficulties  Cognition Arousal/Alertness: Awake/alert Behavior During Therapy: WFL for tasks assessed/performed Overall Cognitive Status: Within Functional Limits for tasks assessed                      General Comments      Exercises        Assessment/Plan    PT Assessment Patent does not need any further PT services  PT Diagnosis Generalized weakness  PT Problem List    PT Treatment Interventions     PT Goals (Current goals can be found in the Care Plan section) Acute Rehab PT Goals PT Goal Formulation: All assessment and education complete, DC therapy    Frequency     Barriers to discharge        Co-evaluation               End of Session Equipment Utilized During Treatment: Gait belt Activity Tolerance: Patient tolerated treatment well Patient left: in bed;with call bell/phone within reach Nurse Communication: Mobility status    Functional Assessment  Tool Used: clinical judgement Functional Limitation: Mobility: Walking and moving around Mobility: Walking and Moving Around Current Status [id number]): At least 1 percent but less than 20 percent impaired, limited or restricted Mobility: Walking and Moving Around Goal Status [phone]): At least 1 percent but less than 20 percent impaired, limited or restricted Mobility: Walking and Moving Around Discharge Status [phone]): At least 1 percent but less than 20 percent impaired, limited or restricted    Time: 1436-1458 PT Time Calculation (min) (ACUTE ONLY): 22 min   Charges:   PT Evaluation $PT Eval Moderate Complexity: 1 Procedure     PT G Codes:   PT G-Codes **NOT FOR INPATIENT CLASS** Functional Assessment Tool Used: clinical judgement Functional Limitation: Mobility: Walking and moving around Mobility: Walking and Moving Around Current Status [id number]): At least 1 percent but less than 20 percent impaired, limited or restricted Mobility: Walking and Moving Around Goal Status [phone]): At least 1 percent but less than 20 percent impaired, limited or restricted Mobility: Walking and Moving Around Discharge Status [phone]): At least 1 percent but less than 20 percent impaired, limited or restricted    Lorriane Shire 10/04/2015, 4:26 PM

## 2015-10-04 NOTE — Progress Notes (Signed)
OT Cancellation Note  Patient Details Name: Debra Short MRN: [id number] DOB: 23-Nov-1940   Cancelled Treatment:    Reason Eval/Treat Not Completed: OT screened, no needs identified, will sign off. Pt reports she feels as if she has returned to baseline; no concerns about managing ADLs or functional mobility upon return home. Politely declining to work with OT at this time; states she walked in the hall with PT less than 45 minutes ago. Per PT; pt currently independent with mobility. No acute OT needs identified; signing off at this time. Please re-consult if needs change. Thank you for this referral.   Binnie Kand M.S., OTR/L Pager: [phone]  10/04/2015, 4:19 PM

## 2015-10-05 ENCOUNTER — Other Ambulatory Visit (HOSPITAL_COMMUNITY): Payer: Self-pay

## 2015-10-05 DIAGNOSIS — R55 Syncope and collapse: Secondary | ICD-10-CM

## 2015-10-05 LAB — GLUCOSE, CAPILLARY
Glucose-Capillary: 95 mg/dL (ref 65–99)
Glucose-Capillary: 107 mg/dL — ABNORMAL HIGH (ref 65–99)
Glucose-Capillary: 95 mg/dL (ref 65–99)

## 2015-10-05 LAB — CBC
HCT: 35.1 % — ABNORMAL LOW (ref 36.0–46.0)
Hemoglobin: 10.6 g/dL — ABNORMAL LOW (ref 12.0–15.0)
MCH: 26.5 pg (ref 26.0–34.0)
MCHC: 30.2 g/dL (ref 30.0–36.0)
MCV: 87.8 fL (ref 78.0–100.0)
Platelets: 232 10*3/uL (ref 150–400)
RBC: 4 MIL/uL (ref 3.87–5.11)
RDW: 16.4 % — ABNORMAL HIGH (ref 11.5–15.5)
WBC: 13.2 10*3/uL — ABNORMAL HIGH (ref 4.0–10.5)

## 2015-10-05 LAB — HEMOGLOBIN A1C
Hgb A1c MFr Bld: 6.1 % — ABNORMAL HIGH (ref 4.8–5.6)
Mean Plasma Glucose: 128 mg/dL

## 2015-10-05 LAB — URINE CULTURE: Culture: NO GROWTH

## 2015-10-05 MED ORDER — ALPRAZOLAM 0.5 MG PO TABS: 0.5000 mg | ORAL_TABLET | Freq: Two times a day (BID) | ORAL | Status: DC | PRN

## 2015-10-05 MED ORDER — ALPRAZOLAM 0.5 MG PO TABS
0.5000 mg | ORAL_TABLET | Freq: Every evening | ORAL | Status: DC | PRN
  Administered 2015-10-06: 1 mg via ORAL
  Filled 2015-10-05: qty 1

## 2015-10-05 MED ORDER — BISOPROLOL FUMARATE 5 MG PO TABS
5.0000 mg | ORAL_TABLET | Freq: Every day | ORAL | Status: DC
  Administered 2015-10-05 – 2015-10-06 (×2): 5 mg via ORAL
  Filled 2015-10-05 (×2): qty 1

## 2015-10-05 NOTE — Progress Notes (Signed)
PROGRESS NOTE        PATIENT DETAILS Name: Debra Short Age: 75 y.o. Sex: female Date of Birth: July 04, 1940 Admit Date: 10/03/2015 Admitting Physician Toy Baker, MD [id number] DAVID, MD Outpatient Specialists:  Brief Narrative: Patient is a 75 y.o. female fibromyalgia, irritable bowel syndrome hypertension, hyperlipidemia, diabetes, chronic kidney disease stage 2 and a remote history of large cell lymphoma admitted with a fall following nausea and vomiting. Not clear if patient had a syncopal episode (remembers most of the episode) causing the fall-patient was very weak and was unable to get up. She was found later in the day by a neighbor and brought to the emergency room. Subsequently found to have white count of 20,000 and admitted for further evaluation and treatment  Subjective: Feels better. Less dysuria and frequency of urination than on admission  Assessment/Plan: Active Problems: Fall: After nausea with vomiting-not sure if she syncopized-very poor historian. Orthostatic vital signs were negative on 4/23. I suspect vomiting was from UTI, which caused patient to be weak and was unable to get up because of this. Although she could have syncopized-I doubt this to be the case-as patient remembers most of the episode. No seizure-like activity, no tongue bite. No major arrhythmias on telemetry-awaiting 2-D echocardiogram. Appreciate PT evaluation, monitor for one additional day before considering discharge tomorrow.  Suspected UTI: Symptomatic with dysuria and frequency of urination-did have significant leukocytosis on admission. Symptoms are resolving with IV Rocephin, urine cultures are pending. Leukocytosis has almost resolved. Follow cultures.  Type 2 diabetes: CBG stable with SSI, resume metformin on discharge  Hypertension: Blood pressure slowly creeping back up-will continue to hold HCTZ, but we will resume bisoprolol.  Dyslipidemia:  Continue statin  Depression/anxiety: Continue Prozac and Xanax  History of fibromyalgia: Supportive care  DVT Prophylaxis: Prophylactic Lovenox   Code Status: Full code   Family Communication: Son at bedside  Disposition Plan: Remain inpatient- Home health likely tomorrow  Antimicrobial agents: IV Rocephin 4/23>>  Procedures: None  CONSULTS:  None  Time spent: 25 minutes-Greater than 50% of this time was spent in counseling, explanation of diagnosis, planning of further management, and coordination of care.  MEDICATIONS: Anti-infectives    Start     Dose/Rate Route Frequency Ordered Stop   10/04/15 2200  cefTRIAXone (ROCEPHIN) 1 g in dextrose 5 % 50 mL IVPB     1 g 100 mL/hr over 30 Minutes Intravenous Every 24 hours 10/04/15 0014     10/03/15 2130  cefTRIAXone (ROCEPHIN) 1 g in dextrose 5 % 50 mL IVPB     1 g 100 mL/hr over 30 Minutes Intravenous  Once 10/03/15 2119 10/03/15 2223      Scheduled Meds: . antiseptic oral rinse  7 mL Mouth Rinse BID  . aspirin  81 mg Oral Daily  . atorvastatin  40 mg Oral q1800  . cefTRIAXone (ROCEPHIN) IVPB 1 gram/50 mL D5W  1 g Intravenous Q24H  . enoxaparin (LOVENOX) injection  40 mg Subcutaneous Q24H  . FLUoxetine  40 mg Oral Daily  . gabapentin  300 mg Oral TID  . insulin aspart  0-5 Units Subcutaneous QHS  . insulin aspart  0-9 Units Subcutaneous TID WC  . montelukast  10 mg Oral QHS  . pantoprazole  40 mg Oral Daily  . sodium chloride flush  3 mL Intravenous Q12H   Continuous Infusions:  PRN Meds:.acetaminophen **OR** acetaminophen, ALPRAZolam, ondansetron **OR** ondansetron (ZOFRAN) IV   PHYSICAL EXAM: Vital signs: Filed Vitals:   10/04/15 0705 10/04/15 1437 10/04/15 2100 10/05/15 0532  BP: 115/41 157/67 159/57 157/53  Pulse: 80 91 96 88  Temp: 98.9 F (37.2 C) 98.5 F (36.9 C) 100.7 F (38.2 C) 99.4 F (37.4 C)  TempSrc: Oral Oral Axillary   Resp: 16 20 16 20   Height:      Weight:      SpO2: 96% 96% 95%  94%   Filed Weights   10/03/15 2337  Weight: 81.511 kg (179 lb 11.2 oz)   Body mass index is 29.02 kg/(m^2).   Gen Exam: Awake and alert with clear speech. Not in any distress  Neck: Supple, No JVD.   Chest: B/L Clear.  No rales or rhonchi CVS: S1 S2 Regular, no murmurs.  Abdomen: soft, BS +, non tender, non distended.  Extremities: no edema, lower extremities warm to touch. Neurologic: Non Focal.   Skin: No Rash or lesions   Wounds: N/A.   LABORATORY DATA: CBC:  Recent Labs Lab 10/03/15 1800 10/04/15 0352 10/05/15 0542  WBC 23.7* 20.0* 13.2*  NEUTROABS 20.4*  --   --   HGB 11.6* 9.7* 10.6*  HCT 38.8 31.0* 35.1*  MCV 87.4 87.8 87.8  PLT 277 246 [id number]    Basic Metabolic Panel:  Recent Labs Lab 10/03/15 1800 10/04/15 0352  NA 141 140  K 3.6 3.7  CL 99* 103  CO2 28 27  GLUCOSE 141* 106*  BUN 18 16  CREATININE 0.86 0.66  CALCIUM 9.4 8.4*  MG  --  2.0  PHOS  --  2.8    GFR: Estimated Creatinine Clearance: 63.4 mL/min (by C-G formula based on Cr of 0.66).  Liver Function Tests:  Recent Labs Lab 10/03/15 1800 10/04/15 0352  AST 39 24  ALT 31 26  ALKPHOS 55 42  BILITOT 0.7 0.8  PROT 6.9 5.9*  ALBUMIN 3.3* 2.7*   No results for input(s): LIPASE, AMYLASE in the last 168 hours. No results for input(s): AMMONIA in the last 168 hours.  Coagulation Profile: No results for input(s): INR, PROTIME in the last 168 hours.  Cardiac Enzymes:  Recent Labs Lab 10/03/15 1800 10/04/15 0352 10/04/15 1042 10/04/15 1627  CKTOTAL 50  --   --   --   TROPONINI  --  <0.03 0.07* 0.04*    BNP (last 3 results) No results for input(s): PROBNP in the last 8760 hours.  HbA1C: No results for input(s): HGBA1C in the last 72 hours.  CBG:  Recent Labs Lab 10/04/15 0812 10/04/15 1224 10/04/15 1659 10/04/15 2056 10/05/15 0750  GLUCAP 79 119* 107* 130* 95    Lipid Profile: No results for input(s): CHOL, HDL, LDLCALC, TRIG, CHOLHDL, LDLDIRECT in the last 72  hours.  Thyroid Function Tests:  Recent Labs  10/04/15 0352  TSH 0.924    Anemia Panel: No results for input(s): VITAMINB12, FOLATE, FERRITIN, TIBC, IRON, RETICCTPCT in the last 72 hours.  Urine analysis:    Component Value Date/Time   COLORURINE YELLOW 10/03/2015 2039   APPEARANCEUR CLOUDY* 10/03/2015 2039   LABSPEC 1.022 10/03/2015 2039   PHURINE 6.0 10/03/2015 2039   GLUCOSEU 250* 10/03/2015 2039   HGBUR TRACE* 10/03/2015 2039   BILIRUBINUR NEGATIVE 10/03/2015 2039   KETONESUR NEGATIVE 10/03/2015 2039   PROTEINUR NEGATIVE 10/03/2015 2039   UROBILINOGEN 1.0 10/31/2014 0238   NITRITE NEGATIVE 10/03/2015 2039   LEUKOCYTESUR SMALL* 10/03/2015 2039  Sepsis Labs: Lactic Acid, Venous    Component Value Date/Time   LATICACIDVEN 1.72 10/03/2015 2241    MICROBIOLOGY: No results found for this or any previous visit (from the past 240 hour(s)).  RADIOLOGY STUDIES/RESULTS: Dg Chest 2 View  10/03/2015  CLINICAL DATA:  Weakness and dizziness for 3 days EXAM: CHEST  2 VIEW COMPARISON:  05/08/2015 FINDINGS: Cardiac shadow is at the upper limits of normal in size. The lungs are well aerated bilaterally. No focal infiltrate or sizable effusion is seen. No acute bony abnormality is noted. IMPRESSION: No active cardiopulmonary disease. Electronically Signed   By: Inez Catalina M.D.   On: 10/03/2015 19:53   Ct Head Wo Contrast  10/03/2015  CLINICAL DATA:  Fall at home.  Head and neck injury. EXAM: CT HEAD WITHOUT CONTRAST CT CERVICAL SPINE WITHOUT CONTRAST TECHNIQUE: Multidetector CT imaging of the head and cervical spine was performed following the standard protocol without intravenous contrast. Multiplanar CT image reconstructions of the cervical spine were also generated. COMPARISON:  Head CT 05/08/2015 FINDINGS: CT HEAD FINDINGS No intracranial hemorrhage, mass effect, or midline shift. No hydrocephalus. The basilar cisterns are patent. No evidence of territorial infarct. No  intracranial fluid collection. Mild chronic small vessel ischemia. Atherosclerosis of skullbase vasculature. Calvarium is intact. Included paranasal sinuses and mastoid air cells are well aerated. Remote right nasal bone fracture. CT CERVICAL SPINE FINDINGS No acute fracture or subluxation. The dens is intact. There are no jumped or perched facets. There is diffuse disc space narrowing and endplate spurring, most prominent at C5-C6 and C6-C7. Multilevel facet arthropathy. Degenerative type anterolisthesis of C3 on C4 and C5 on C6. There is multilevel neural foraminal narrowing. No prevertebral soft tissue edema. IMPRESSION: 1.  No acute intracranial abnormality. 2. No acute fracture or subluxation of the cervical spine. There is multilevel degenerative disc disease and facet arthropathy with degenerative type anterolisthesis at C3-C4 and C5-C6. Electronically Signed   By: Jeb Levering M.D.   On: 10/03/2015 19:41   Ct Cervical Spine Wo Contrast  10/03/2015  CLINICAL DATA:  Fall at home.  Head and neck injury. EXAM: CT HEAD WITHOUT CONTRAST CT CERVICAL SPINE WITHOUT CONTRAST TECHNIQUE: Multidetector CT imaging of the head and cervical spine was performed following the standard protocol without intravenous contrast. Multiplanar CT image reconstructions of the cervical spine were also generated. COMPARISON:  Head CT 05/08/2015 FINDINGS: CT HEAD FINDINGS No intracranial hemorrhage, mass effect, or midline shift. No hydrocephalus. The basilar cisterns are patent. No evidence of territorial infarct. No intracranial fluid collection. Mild chronic small vessel ischemia. Atherosclerosis of skullbase vasculature. Calvarium is intact. Included paranasal sinuses and mastoid air cells are well aerated. Remote right nasal bone fracture. CT CERVICAL SPINE FINDINGS No acute fracture or subluxation. The dens is intact. There are no jumped or perched facets. There is diffuse disc space narrowing and endplate spurring, most  prominent at C5-C6 and C6-C7. Multilevel facet arthropathy. Degenerative type anterolisthesis of C3 on C4 and C5 on C6. There is multilevel neural foraminal narrowing. No prevertebral soft tissue edema. IMPRESSION: 1.  No acute intracranial abnormality. 2. No acute fracture or subluxation of the cervical spine. There is multilevel degenerative disc disease and facet arthropathy with degenerative type anterolisthesis at C3-C4 and C5-C6. Electronically Signed   By: Jeb Levering M.D.   On: 10/03/2015 19:41     LOS: 1 day   Oren Binet, MD  [facility] Pager:[phone]  If 7PM-7AM, please contact night-coverage [email or web address] Password [facility] 10/05/2015, 9:55 AM

## 2015-10-06 ENCOUNTER — Inpatient Hospital Stay (HOSPITAL_COMMUNITY): Payer: Medicare Other

## 2015-10-06 DIAGNOSIS — R55 Syncope and collapse: Secondary | ICD-10-CM

## 2015-10-06 LAB — GLUCOSE, CAPILLARY
Glucose-Capillary: 101 mg/dL — ABNORMAL HIGH (ref 65–99)
Glucose-Capillary: 102 mg/dL — ABNORMAL HIGH (ref 65–99)
Glucose-Capillary: 117 mg/dL — ABNORMAL HIGH (ref 65–99)

## 2015-10-06 LAB — ECHOCARDIOGRAM COMPLETE
Height: 66 in
Weight: 2875.2 oz

## 2015-10-06 MED ORDER — BISOPROLOL FUMARATE 5 MG PO TABS: 5.0000 mg | ORAL_TABLET | Freq: Every day | ORAL | Status: DC

## 2015-10-06 MED ORDER — GABAPENTIN 300 MG PO CAPS: 600.0000 mg | ORAL_CAPSULE | Freq: Every day | ORAL | Status: DC

## 2015-10-06 MED ORDER — CEPHALEXIN 500 MG PO CAPS: 500.0000 mg | ORAL_CAPSULE | Freq: Three times a day (TID) | ORAL | Status: DC

## 2015-10-06 NOTE — Progress Notes (Signed)
  Echocardiogram 2D Echocardiogram has been performed.  Darlina Sicilian M 10/06/2015, 8:47 AM

## 2015-10-06 NOTE — Discharge Summary (Signed)
PATIENT DETAILS Name: Debra Short Age: 75 y.o. Sex: female Date of Birth: 09-28-1940 MRN: [id number]. Admitting Physician: Toy Baker, MD [id number] DAVID, MD  Admit Date: 10/03/2015 Discharge date: 10/06/2015  Recommendations for Outpatient Follow-up:  1. 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 Hyperlipidemia   . Hypertension   . GERD (gastroesophageal reflux disease)   . Vitamin D deficiency   . IBS (irritable bowel syndrome)   . 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 Fibromyalgia     DISCHARGE MEDICATIONS: Current Discharge Medication List    START taking these medications   Details  bisoprolol (ZEBETA) 5 MG tablet Take 1 tablet (5 mg total) by mouth daily. Qty: 30 tablet, Refills: 0    cephALEXin (KEFLEX) 500 MG capsule Take 1 capsule (500 mg total) by mouth 3 (three) times daily. Qty: 5 capsule, Refills: 0      CONTINUE these medications which have CHANGED   Details  gabapentin (NEURONTIN) 300 MG capsule Take 2 capsules (600 mg total) by mouth at bedtime.   Associated Diagnoses: Chronic pain syndrome      CONTINUE these medications which have NOT CHANGED   Details  acetaminophen (TYLENOL) 650 MG CR tablet Take 650 mg by mouth every 8 (eight) hours as needed for pain.    ALPRAZolam (XANAX) 1 MG tablet Take 1/2 to 1 tablet 2 to 3 x/daily as needed for anxiety Qty: 270 tablet, Refills: 1   Associated Diagnoses: Anxiety state    aspirin EC 81 MG tablet Take 81 mg by mouth daily.    atorvastatin (LIPITOR) 80 MG tablet TAKE 1/3 TABLET BY MOUTH DAILY FOR CHOLESTEROL. DISCONTINUE PRAVASTATIN Qty: 30 tablet, Refills: 2    Cholecalciferol (VITAMIN D3) 5000 units TABS Take  10,000 Units by mouth daily.    Esomeprazole Magnesium (NEXIUM PO) Take 22.3 mg by mouth See admin instructions. Take 1 capsule (22.3 mg OTC) by mouth every morning before breakfast, may also take 1 capsule in the evening as needed for acid reflux    FLUoxetine (PROZAC) 40 MG capsule Take 1 capsule (40 mg total) by mouth daily. Qty: 90 capsule, Refills: 3    metFORMIN (GLUCOPHAGE XR) 500 MG 24 hr tablet Take 1 tablet (500 mg total) by mouth daily with breakfast. Metformin being held for 48 hours due to contrast you received on 10/31/2014. May start taking it on 11/02/2014 Qty: 90 tablet, Refills: 3   Associated Diagnoses: Prediabetes    montelukast (SINGULAIR) 10 MG tablet TAKE 1 TABLET EVERY DAY AS NEEDED FOR ALLERGIES Qty: 90 tablet, Refills: 2    Multiple Vitamin (MULTIVITAMIN WITH MINERALS) TABS tablet Take 1 tablet by mouth daily.    rizatriptan (MAXALT) 10 MG tablet Take 10 mg by mouth daily as needed for migraine.  Refills: 1    triamcinolone (NASACORT) 55 MCG/ACT AERO nasal inhaler Place 2 sprays into both nostrils daily as needed (for seasonal allergies).       STOP taking these medications     bisoprolol-hydrochlorothiazide (ZIAC) 5-6.25 MG tablet      HYDROcodone-acetaminophen (NORCO/VICODIN) 5-325 MG tablet      promethazine-dextromethorphan (PROMETHAZINE-DM) 6.25-15 MG/5ML syrup      aspirin  81 MG chewable tablet         ALLERGIES:   Allergies  Allergen Reactions  . Effexor [Venlafaxine] Other (See Comments)    Not effective  . Nortriptyline Other (See Comments)    Not effective  . Paxil [Paroxetine Hcl] Other (See Comments)    Not effective  . Robaxin [Methocarbamol] Itching  . Tizanidine Itching  . Zoloft [Sertraline Hcl] Other (See Comments)    Not effective    BRIEF HPI:  See H&P, Labs, Consult and Test reports for all details in brief, Patient is a 75 y.o. female fibromyalgia, irritable bowel syndrome hypertension, hyperlipidemia, diabetes, chronic  kidney disease stage 2 and a remote history of large cell lymphoma admitted with a fall following nausea and vomiting. Not clear if patient had a syncopal episode (remembers most of the episode) causing the fall-patient was very weak and was unable to get up. She was found later in the day by a neighbor and brought to the emergency room. Subsequently found to have white count of 20,000 and admitted for further evaluation and treatment  CONSULTATIONS:   None  PERTINENT RADIOLOGIC STUDIES: Dg Chest 2 View  10/03/2015  CLINICAL DATA:  Weakness and dizziness for 3 days EXAM: CHEST  2 VIEW COMPARISON:  05/08/2015 FINDINGS: Cardiac shadow is at the upper limits of normal in size. The lungs are well aerated bilaterally. No focal infiltrate or sizable effusion is seen. No acute bony abnormality is noted. IMPRESSION: No active cardiopulmonary disease. Electronically Signed   By: Inez Catalina M.D.   On: 10/03/2015 19:53   Ct Head Wo Contrast  10/03/2015  CLINICAL DATA:  Fall at home.  Head and neck injury. EXAM: CT HEAD WITHOUT CONTRAST CT CERVICAL SPINE WITHOUT CONTRAST TECHNIQUE: Multidetector CT imaging of the head and cervical spine was performed following the standard protocol without intravenous contrast. Multiplanar CT image reconstructions of the cervical spine were also generated. COMPARISON:  Head CT 05/08/2015 FINDINGS: CT HEAD FINDINGS No intracranial hemorrhage, mass effect, or midline shift. No hydrocephalus. The basilar cisterns are patent. No evidence of territorial infarct. No intracranial fluid collection. Mild chronic small vessel ischemia. Atherosclerosis of skullbase vasculature. Calvarium is intact. Included paranasal sinuses and mastoid air cells are well aerated. Remote right nasal bone fracture. CT CERVICAL SPINE FINDINGS No acute fracture or subluxation. The dens is intact. There are no jumped or perched facets. There is diffuse disc space narrowing and endplate spurring, most prominent at  C5-C6 and C6-C7. Multilevel facet arthropathy. Degenerative type anterolisthesis of C3 on C4 and C5 on C6. There is multilevel neural foraminal narrowing. No prevertebral soft tissue edema. IMPRESSION: 1.  No acute intracranial abnormality. 2. No acute fracture or subluxation of the cervical spine. There is multilevel degenerative disc disease and facet arthropathy with degenerative type anterolisthesis at C3-C4 and C5-C6. Electronically Signed   By: Jeb Levering M.D.   On: 10/03/2015 19:41   Ct Cervical Spine Wo Contrast  10/03/2015  CLINICAL DATA:  Fall at home.  Head and neck injury. EXAM: CT HEAD WITHOUT CONTRAST CT CERVICAL SPINE WITHOUT CONTRAST TECHNIQUE: Multidetector CT imaging of the head and cervical spine was performed following the standard protocol without intravenous contrast. Multiplanar CT image reconstructions of the cervical spine were also generated. COMPARISON:  Head CT 05/08/2015 FINDINGS: CT HEAD FINDINGS No intracranial hemorrhage, mass effect, or midline shift. No hydrocephalus. The basilar cisterns are patent. No evidence of territorial infarct. No intracranial fluid collection. Mild chronic small vessel ischemia. Atherosclerosis of  skullbase vasculature. Calvarium is intact. Included paranasal sinuses and mastoid air cells are well aerated. Remote right nasal bone fracture. CT CERVICAL SPINE FINDINGS No acute fracture or subluxation. The dens is intact. There are no jumped or perched facets. There is diffuse disc space narrowing and endplate spurring, most prominent at C5-C6 and C6-C7. Multilevel facet arthropathy. Degenerative type anterolisthesis of C3 on C4 and C5 on C6. There is multilevel neural foraminal narrowing. No prevertebral soft tissue edema. IMPRESSION: 1.  No acute intracranial abnormality. 2. No acute fracture or subluxation of the cervical spine. There is multilevel degenerative disc disease and facet arthropathy with degenerative type anterolisthesis at C3-C4 and  C5-C6. Electronically Signed   By: Jeb Levering M.D.   On: 10/03/2015 19:41     PERTINENT LAB RESULTS: CBC:  Recent Labs  10/04/15 0352 10/05/15 0542  WBC 20.0* 13.2*  HGB 9.7* 10.6*  HCT 31.0* 35.1*  PLT 246 232   CMET CMP     Component Value Date/Time   NA 140 10/04/2015 0352   NA 138 10/09/2014   K 3.7 10/04/2015 0352   CL 103 10/04/2015 0352   CO2 27 10/04/2015 0352   GLUCOSE 106* 10/04/2015 0352   BUN 16 10/04/2015 0352   BUN 14 10/09/2014   CREATININE 0.66 10/04/2015 0352   CREATININE 0.87 09/08/2015 1118   CREATININE 0.8 10/09/2014   CALCIUM 8.4* 10/04/2015 0352   PROT 5.9* 10/04/2015 0352   ALBUMIN 2.7* 10/04/2015 0352   AST 24 10/04/2015 0352   ALT 26 10/04/2015 0352   ALKPHOS 42 10/04/2015 0352   BILITOT 0.8 10/04/2015 0352   GFRNONAA >60 10/04/2015 0352   GFRNONAA 65 09/08/2015 1118   GFRAA >60 10/04/2015 0352   GFRAA 75 09/08/2015 1118    GFR Estimated Creatinine Clearance: 63.4 mL/min (by C-G formula based on Cr of 0.66). No results for input(s): LIPASE, AMYLASE in the last 72 hours.  Recent Labs  10/03/15 1800 10/04/15 0352 10/04/15 1042 10/04/15 1627  CKTOTAL 50  --   --   --   TROPONINI  --  <0.03 0.07* 0.04*   Invalid input(s): POCBNP No results for input(s): DDIMER in the last 72 hours.  Recent Labs  10/04/15 0352  HGBA1C 6.1*   No results for input(s): CHOL, HDL, LDLCALC, TRIG, CHOLHDL, LDLDIRECT in the last 72 hours.  Recent Labs  10/04/15 0352  TSH 0.924   No results for input(s): VITAMINB12, FOLATE, FERRITIN, TIBC, IRON, RETICCTPCT in the last 72 hours. Coags: No results for input(s): INR in the last 72 hours.  Invalid input(s): PT Microbiology: Recent Results (from the past 240 hour(s))  Urine culture     Status: None   Collection Time: 10/04/15  8:50 AM  Result Value Ref Range Status   Specimen Description URINE, CLEAN CATCH  Final   Special Requests NONE  Final   Culture NO GROWTH 1 DAY  Final   Report  Status 10/05/2015 FINAL  Final     BRIEF HOSPITAL COURSE:  Fall: After nausea with vomiting-not sure if she syncopized-very poor historian. Orthostatic vital signs were negative on 4/23. I suspect vomiting was from UTI, which caused patient to be weak and was unable to get up because of this. Although she could have syncopized-I doubt this to be the case-as patient remembers most of the episode. No seizure-like activity, no tongue bite. No major arrhythmias on telemetry-2-D echocardiogram without major abnormalities. Patient requesting discharge, seen by physical therapy not felt to require home health  services. Have asked patient to see if she can get a life alert,  she will have multiple family members getting in touch with her numerous times a day.   Suspected UTI: Symptomatic with dysuria and frequency of urination-did have significant leukocytosis on admission. Symptoms have resolved with IV Rocephin, leukocytosis has most resolved.she remains afebrile. Surprisingly urine cultures are negative, we will transition to Keflex and complete a total of 5 days of empiric antibiotics.  Type 2 diabetes: CBG stable with SSI while inpatient , resume metformin on discharge  Hypertension: Blood pressure slowly creeping back up-will continue to hold HCTZ, but we will resume bisoprolol.  Dyslipidemia: Continue statin  Depression/anxiety: Continue Prozac and Xanax  History of fibromyalgia: Supportive care  TODAY-DAY OF DISCHARGE:  Subjective:   Micahla Cabo today has no headache,no chest abdominal pain,no new weakness tingling or numbness, feels much better wants to go home today.   Objective:   Blood pressure 126/51, pulse 78, temperature 99.4 F (37.4 C), temperature source Oral, resp. rate 20, height 5\' 6"  (1.676 m), weight 81.511 kg (179 lb 11.2 oz), SpO2 97 %.  Intake/Output Summary (Last 24 hours) at 10/06/15 1003 Last data filed at 10/05/15 1455  Gross per 24 hour  Intake    240 ml    Output      0 ml  Net    240 ml   Filed Weights   10/03/15 2337  Weight: 81.511 kg (179 lb 11.2 oz)    Exam Awake Alert, Oriented *3, No new F.N deficits, Normal affect [place].AT,PERRAL Supple Neck,No JVD, No cervical lymphadenopathy appriciated.  Symmetrical Chest wall movement, Good air movement bilaterally, CTAB RRR,No Gallops,Rubs or new Murmurs, No Parasternal Heave +ve B.Sounds, Abd Soft, Non tender, No organomegaly appriciated, No rebound -guarding or rigidity. No Cyanosis, Clubbing or edema, No new Rash or bruise  DISCHARGE CONDITION: Stable  DISPOSITION: Home  DISCHARGE INSTRUCTIONS:    Activity:  As tolerated with Full fall precautions use walker/cane & assistance as needed  Get Medicines reviewed and adjusted: Please take all your medications with you for your next visit with your Primary MD  Please request your Primary MD to go over all hospital tests and procedure/radiological results at the follow up, please ask your Primary MD to get all Hospital records sent to his/her office.  If you experience worsening of your admission symptoms, develop shortness of breath, life threatening emergency, suicidal or homicidal thoughts you must seek medical attention immediately by calling 911 or calling your MD immediately  if symptoms less severe.  You must read complete instructions/literature along with all the possible adverse reactions/side effects for all the Medicines you take and that have been prescribed to you. Take any new Medicines after you have completely understood and accpet all the possible adverse reactions/side effects.   Do not drive when taking Pain medications.   Do not take more than prescribed Pain, Sleep and Anxiety Medications  Special Instructions: If you have smoked or chewed Tobacco  in the last 2 yrs please stop smoking, stop any regular Alcohol  and or any Recreational drug use.  Wear Seat belts while driving.  Please note  You were cared for  by a hospitalist during your hospital stay. Once you are discharged, your primary care physician will handle any further medical issues. Please note that NO REFILLS for any discharge medications will be authorized once you are discharged, as it is imperative that you return to your primary care physician (or establish a relationship  with a primary care physician if you do not have one) for your aftercare needs so that they can reassess your need for medications and monitor your lab values.   Diet recommendation: Diabetic Diet Heart Healthy diet  Discharge Instructions    Diet - low sodium heart healthy    Complete by:  As directed      Increase activity slowly    Complete by:  As directed            Follow-up Information    Follow up with [name], MD. Schedule an appointment as soon as possible for a visit in 1 week.   Specialty:  Internal Medicine   Why:  Hospital follow up   Contact information:   [address] [place] [address] [phone]      Total Time spent on discharge equals  45 minutes.  SignedOren Binet 10/06/2015 10:03 AM

## 2015-10-06 NOTE — Care Management Note (Signed)
Case Management Note  Patient Details  Name: Debra Short MRN: [id number] Date of Birth: 18-Oct-1940  Subjective/Objective:                 Spoke with patient at the bedside, She is admitted with DM complications. She states her niece is a Therapist, sports and can help her after DC. Patient agreed to [facility] RN only. well. Patient lives alone has a BSC and a shower seat, she uses a walker, she denies any further DME needs. She states that she has used [facility] in the past for [facility], and would like them again.   Action/Plan:  DC to home, referral made for [facility] RN to [place]. Expected Discharge Date:                  Expected Discharge Plan:  [place]  In-House Referral:     Discharge planning Services  CM Consult  Post Acute Care Choice:  Home Health Choice offered to:  Patient  DME Arranged:    DME Agency:     HH Arranged:  RN [place] Agency:  [place]  Status of Service:  Completed, signed off  Medicare Important Message Given:    Date Medicare IM Given:    Medicare IM give by:    Date Additional Medicare IM Given:    Additional Medicare Important Message give by:     If discussed at [place] of Stay Meetings, dates discussed:    Additional Comments:  Carles Collet, RN 10/06/2015, 10:48 AM

## 2015-10-06 NOTE — Progress Notes (Signed)
Ach[place] Dunk to be D/C'd Home per MD order.  Discussed with the patient and all questions fully answered.  VSS, Skin clean, dry and intact without evidence of skin break down, no evidence of skin tears noted. IV catheter discontinued intact. Site without signs and symptoms of complications. Dressing and pressure applied.  An After Visit Summary was printed and given to the patient. Patient received prescription.  D/c education completed with patient/family including follow up instructions, medication list, d/c activities limitations if indicated, with other d/c instructions as indicated by MD - patient able to verbalize understanding, all questions fully answered.   Patient instructed to return to ED, call 911, or call MD for any changes in condition.   Patient escorted via [place], and D/C home via private auto.  L'ESPERANCE, Shaliyah Taite C 10/06/2015 2:17 PM

## 2015-10-10 DIAGNOSIS — N182 Chronic kidney disease, stage 2 (mild): Secondary | ICD-10-CM | POA: Diagnosis not present

## 2015-10-10 DIAGNOSIS — Z7984 Long term (current) use of oral hypoglycemic drugs: Secondary | ICD-10-CM | POA: Diagnosis not present

## 2015-10-10 DIAGNOSIS — I129 Hypertensive chronic kidney disease with stage 1 through stage 4 chronic kidney disease, or unspecified chronic kidney disease: Secondary | ICD-10-CM | POA: Diagnosis not present

## 2015-10-10 DIAGNOSIS — E1122 Type 2 diabetes mellitus with diabetic chronic kidney disease: Secondary | ICD-10-CM | POA: Diagnosis not present

## 2015-10-10 DIAGNOSIS — G894 Chronic pain syndrome: Secondary | ICD-10-CM | POA: Diagnosis not present

## 2015-10-10 DIAGNOSIS — Z9181 History of falling: Secondary | ICD-10-CM | POA: Diagnosis not present

## 2015-10-10 DIAGNOSIS — F418 Other specified anxiety disorders: Secondary | ICD-10-CM | POA: Diagnosis not present

## 2015-10-10 DIAGNOSIS — C8580 Other specified types of non-Hodgkin lymphoma, unspecified site: Secondary | ICD-10-CM | POA: Diagnosis not present

## 2015-10-10 DIAGNOSIS — N390 Urinary tract infection, site not specified: Secondary | ICD-10-CM | POA: Diagnosis not present

## 2015-10-14 ENCOUNTER — Encounter: Payer: Self-pay | Admitting: Internal Medicine

## 2015-10-14 ENCOUNTER — Ambulatory Visit (INDEPENDENT_AMBULATORY_CARE_PROVIDER_SITE_OTHER): Payer: Medicare Other | Admitting: Internal Medicine

## 2015-10-14 VITALS — BP 112/76 | HR 72 | Temp 97.3°F | Resp 16 | Ht 65.0 in | Wt 170.6 lb

## 2015-10-14 DIAGNOSIS — E860 Dehydration: Secondary | ICD-10-CM

## 2015-10-14 DIAGNOSIS — N390 Urinary tract infection, site not specified: Secondary | ICD-10-CM

## 2015-10-14 DIAGNOSIS — Z79899 Other long term (current) drug therapy: Secondary | ICD-10-CM

## 2015-10-14 DIAGNOSIS — I10 Essential (primary) hypertension: Secondary | ICD-10-CM

## 2015-10-14 MED ORDER — BISOPROLOL FUMARATE 5 MG PO TABS: ORAL_TABLET | ORAL | Status: DC

## 2015-10-14 NOTE — Patient Instructions (Signed)
Urosepsis Urosepsis is an infection that has spread to the bloodstream from the urinary tract. It is a severe illness. If it is not treated immediately, urosepsis can be life threatening. CAUSES  The cause of urosepsis is not known.  RISK FACTORS  Advanced age.  Having diabetes mellitus.  Having a weakened immune system.  Having kidney stones. SIGNS AND SYMPTOMS   Fever or low body temperature (hypothermia).  Rapid breathing (hyperventilation).  Chills.  Rapid heartbeat (tachycardia). When sepsis is severe, low blood pressure and decreased oxygen flow to your vital organs may also occur. This is called shock. DIAGNOSIS  Your health care provider may suspect urosepsis based on your medical history and a physical exam. Urine and blood tests may be done to confirm a diagnosis of urosepsis. Other tests, such as X-ray exams, ultrasound exams, or a CT scan, may be done to determine the severity of your condition.  TREATMENT  Urosepsis requires prompt treatment with medicines. You will receive fluids through an IV tube to support your blood pressure. You may also receive medicines to increase your blood pressure and medicines to control pain or nausea.  HOME CARE INSTRUCTIONS   Take medicines only as directed by your health care provider.  Drink enough fluids to keep your urine clear or pale yellow. In addition to water, cranberry juice is recommended.  Avoid caffeine, tea, and carbonated beverages. They can irritate the bladder.  Avoid alcohol. It may irritate the prostate, if this applies.  Get plenty of rest. Increase your activity as tolerated.  Keep all follow-up visits as directed by your health care provider.  Empty your bladder often. Avoid holding urine for long periods of time.  Empty your bladder before and after sexual intercourse.  After a bowel movement, women should wipe from front to back using each tissue only once. SEEK MEDICAL CARE IF:   You develop back  pain.  You develop nausea or vomiting.  Your symptoms are not better after 3 days.  You develop a fever or chills. SEEK IMMEDIATE MEDICAL CARE IF:   You feel light-headed or develop shortness of breath.  You are getting worse, not better. MAKE SURE YOU:  Understand these instructions.  Will watch your condition.  Will get help right away if you are not doing well or get worse.   This information is not intended to replace advice given to you by your health care provider. Make sure you discuss any questions you have with your health care provider.   Document Released: 05/30/2005 Document Revised: 06/20/2014 Document Reviewed: 02/04/2013 Elsevier Interactive Patient Education [employer].

## 2015-10-14 NOTE — Progress Notes (Signed)
Subjective:    Patient ID: Debra Short, female    DOB: 1941-05-06, 75 y.o.   MRN: [id number]  HPI  Patient is a very nice WWF w/HTN, HLD, T2_DM, GERD, IBS who was recently hospitalized 4/22-25 for Urosepsis and Dehydration and responded promptly to IVF rescue & IV Abx's & she was d/c'd on Keflex which she did not fill. In the 1 week interim, she denies any fever, chills, sweats, respiratory, GI or GU Sx's.   Medication Sig  . acetaminophen  650 MG CR tablet Take 650 mg by mouth every 8 (eight) hours as needed for pain.  Marland Kitchen ALPRAZolam  1 MG tablet Take 1/2 to 1 tablet 2 to 3 x/daily as needed for anxiety   . aspirin EC 81 MG Take 81 mg by mouth daily.  Marland Kitchen atorvastatin  80 MG  TAKE 1/3 TABLET BY MOUTH DAILY FOR CHOLESTEROL. DISCONTINUE PRAVASTATIN  . VITAMIN D3 5000 units  Take 10,000 Units by mouth daily.  Marland Kitchen NEXIUM 22.3 mg Take 1 cap every morning before breakfast, may also take 1 capsule in the evening as needed for acid reflux  . FLUoxetine  40 MG  Take 1 capsule (40 mg total) by mouth daily.  Marland Kitchen gabapentin  300 MG  Take 2 capsules (600 mg total) by mouth at bedtime.  . metFORMIN-XR 500 MG  Patient taking differently: Take 500 mg by mouth daily with breakfast  . montelukast  10 MG  TAKE 1 TABLET EVERY DAY AS NEEDED FOR ALLERGIES   . MULTIVITAMIN WITH MINERALS Take 1 tablet by mouth daily.  . rizatriptan  10 MG  Take 10 mg by mouth daily as needed for migraine.   Marland Kitchen NASACORT nasal inhaler Place 2 sprays into both nostrils daily as needed (for seasonal allergies).   Marland Kitchen KEFLEX 500 MG capsule Patient not taking: Reported on 10/14/2015  . bisoprolol  5 MG Patient not taking: Reported on 10/14/2015   Allergies  Allergen Reactions  . Effexor [Venlafaxine] Other (See Comments)    Not effective  . Nortriptyline Other (See Comments)    Not effective  . Paxil [Paroxetine Hcl] Other (See Comments)    Not effective  . Robaxin [Methocarbamol] Itching  . Tizanidine Itching  . Zoloft [Sertraline Hcl]  Other (See Comments)    Not effective   Past Medical History  Diagnosis Date  . Hyperlipidemia   . Hypertension   . GERD (gastroesophageal reflux disease)   . Vitamin D deficiency   . IBS (irritable bowel syndrome)   . 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 Fibromyalgia    Review of Systems  10 point systems review negative except as above.    Objective:   Physical Exam  BP 112/76 mmHg  Pulse 72  Temp(Src) 97.3 F (36.3 C)  Resp 16  Ht 5\' 5"  (1.651 m)  Wt 170 lb 9.6 oz (77.384 kg)  BMI 28.39 kg/m2  HEENT - Eac's patent. TM's Nl. EOM's full. PERRLA. NasoOroPharynx clear. Neck - supple. Nl Thyroid. Carotids 2+ & No bruits, nodes, JVD Chest - Clear equal BS w/o Rales, rhonchi, wheezes. Cor - Nl HS. RRR w/o sig MGR. PP 1(+). No edema. Abd - No palpable organomegaly, masses or tenderness. BS nl. MS- FROM w/o deformities. Muscle power, tone and bulk Nl. Gait Nl. Neuro - No obvious Cr N abnormalities. Sensory, motor and Cerebellar functions appear Nl w/o focal abnormalities.    Assessment & Plan:   1. Essential hypertension  - D/c Ziac 5/6.25 to  avoid diuretic. - bisoprolol  5 MG tablet; Take 1 tablet every morning for BP   2. Urinary tract infection, site not specified  - Urinalysis, Routine w reflex microscopic - Urine culture  3. Dehydration   4. Medication management  - BASIC METABOLIC PANEL WITH GFR - Hepatic function panel

## 2015-10-15 LAB — BASIC METABOLIC PANEL WITH GFR
BUN: 17 mg/dL (ref 7–25)
CO2: 32 mmol/L — ABNORMAL HIGH (ref 20–31)
Calcium: 9.7 mg/dL (ref 8.6–10.4)
Chloride: 95 mmol/L — ABNORMAL LOW (ref 98–110)
Creat: 0.93 mg/dL (ref 0.60–0.93)
GFR, Est African American: 69 mL/min (ref >=60)
GFR, Est Non African American: 59 mL/min — ABNORMAL LOW (ref >=60)
Glucose, Bld: 96 mg/dL (ref 65–99)
Potassium: 5 mmol/L (ref 3.5–5.3)
Sodium: 139 mmol/L (ref 135–146)

## 2015-10-15 LAB — URINALYSIS, ROUTINE W REFLEX MICROSCOPIC
Bilirubin Urine: NEGATIVE
Glucose, UA: NEGATIVE
Hgb urine dipstick: NEGATIVE
Ketones, ur: NEGATIVE
Nitrite: NEGATIVE
Protein, ur: NEGATIVE
Specific Gravity, Urine: 1.013 (ref 1.001–1.035)
pH: 6 (ref 5.0–8.0)

## 2015-10-15 LAB — URINALYSIS, MICROSCOPIC ONLY
Bacteria, UA: NONE SEEN [HPF]
Casts: NONE SEEN [LPF]
Crystals: NONE SEEN [HPF]
RBC / HPF: NONE SEEN RBC/HPF (ref <=2)
Yeast: NONE SEEN [HPF]

## 2015-10-15 LAB — HEPATIC FUNCTION PANEL
ALT: 17 U/L (ref 6–29)
AST: 22 U/L (ref 10–35)
Albumin: 4.3 g/dL (ref 3.6–5.1)
Alkaline Phosphatase: 56 U/L (ref 33–130)
Bilirubin, Direct: 0.1 mg/dL (ref <=0.2)
Indirect Bilirubin: 0.4 mg/dL (ref 0.2–1.2)
Total Bilirubin: 0.5 mg/dL (ref 0.2–1.2)
Total Protein: 7.3 g/dL (ref 6.1–8.1)

## 2015-10-16 LAB — URINE CULTURE: Colony Count: 40000

## 2015-10-29 ENCOUNTER — Encounter: Payer: Self-pay | Admitting: Internal Medicine

## 2015-11-07 ENCOUNTER — Other Ambulatory Visit: Payer: Self-pay | Admitting: Internal Medicine

## 2015-12-10 ENCOUNTER — Ambulatory Visit (INDEPENDENT_AMBULATORY_CARE_PROVIDER_SITE_OTHER): Payer: Medicare Other | Admitting: Internal Medicine

## 2015-12-10 ENCOUNTER — Encounter: Payer: Self-pay | Admitting: Internal Medicine

## 2015-12-10 VITALS — BP 126/60 | HR 72 | Temp 98.2°F | Resp 18 | Ht 65.0 in | Wt 165.0 lb

## 2015-12-10 DIAGNOSIS — K589 Irritable bowel syndrome without diarrhea: Secondary | ICD-10-CM

## 2015-12-10 DIAGNOSIS — C8580 Other specified types of non-Hodgkin lymphoma, unspecified site: Secondary | ICD-10-CM

## 2015-12-10 DIAGNOSIS — Z0001 Encounter for general adult medical examination with abnormal findings: Secondary | ICD-10-CM | POA: Diagnosis not present

## 2015-12-10 DIAGNOSIS — K219 Gastro-esophageal reflux disease without esophagitis: Secondary | ICD-10-CM

## 2015-12-10 DIAGNOSIS — R6889 Other general symptoms and signs: Secondary | ICD-10-CM | POA: Diagnosis not present

## 2015-12-10 DIAGNOSIS — E1122 Type 2 diabetes mellitus with diabetic chronic kidney disease: Secondary | ICD-10-CM

## 2015-12-10 DIAGNOSIS — G894 Chronic pain syndrome: Secondary | ICD-10-CM | POA: Diagnosis not present

## 2015-12-10 DIAGNOSIS — Z0000 Encounter for general adult medical examination without abnormal findings: Secondary | ICD-10-CM

## 2015-12-10 DIAGNOSIS — Z6827 Body mass index (BMI) 27.0-27.9, adult: Secondary | ICD-10-CM

## 2015-12-10 DIAGNOSIS — I10 Essential (primary) hypertension: Secondary | ICD-10-CM

## 2015-12-10 DIAGNOSIS — G4700 Insomnia, unspecified: Secondary | ICD-10-CM

## 2015-12-10 DIAGNOSIS — E785 Hyperlipidemia, unspecified: Secondary | ICD-10-CM | POA: Diagnosis not present

## 2015-12-10 DIAGNOSIS — N182 Chronic kidney disease, stage 2 (mild): Secondary | ICD-10-CM

## 2015-12-10 DIAGNOSIS — F418 Other specified anxiety disorders: Secondary | ICD-10-CM

## 2015-12-10 DIAGNOSIS — Z79899 Other long term (current) drug therapy: Secondary | ICD-10-CM | POA: Diagnosis not present

## 2015-12-10 DIAGNOSIS — E559 Vitamin D deficiency, unspecified: Secondary | ICD-10-CM

## 2015-12-10 LAB — CBC WITH DIFFERENTIAL/PLATELET
Basophils Absolute: 0 cells/uL (ref 0–200)
Basophils Relative: 0 %
Eosinophils Absolute: 69 cells/uL (ref 15–500)
Eosinophils Relative: 1 %
HCT: 39.5 % (ref 35.0–45.0)
Hemoglobin: 12.6 g/dL (ref 11.7–15.5)
Lymphocytes Relative: 23 %
Lymphs Abs: 1587 cells/uL (ref 850–3900)
MCH: 28.1 pg (ref 27.0–33.0)
MCHC: 31.9 g/dL — ABNORMAL LOW (ref 32.0–36.0)
MCV: 88.2 fL (ref 80.0–100.0)
MPV: 9 fL (ref 7.5–12.5)
Monocytes Absolute: 828 cells/uL (ref 200–950)
Monocytes Relative: 12 %
Neutro Abs: 4416 cells/uL (ref 1500–7800)
Neutrophils Relative %: 64 %
Platelets: 305 10*3/uL (ref 140–400)
RBC: 4.48 MIL/uL (ref 3.80–5.10)
RDW: 16.3 % — ABNORMAL HIGH (ref 11.0–15.0)
WBC: 6.9 10*3/uL (ref 3.8–10.8)

## 2015-12-10 MED ORDER — GABAPENTIN 300 MG PO CAPS: 600.0000 mg | ORAL_CAPSULE | Freq: Every day | ORAL | Status: DC

## 2015-12-10 NOTE — Progress Notes (Signed)
MEDICARE ANNUAL WELLNESS VISIT AND FOLLOW UP  Assessment:    1. Chronic pain syndrome -medications refilled -encouraged exercise and stretching as much as possible - gabapentin (NEURONTIN) 300 MG capsule; Take 2 capsules (600 mg total) by mouth at bedtime.  Dispense: 180 capsule; Refill: 2  2. Essential hypertension -well controlled -cont meds -dash diet -exercise as tolerated -monitor at home - TSH  3. Controlled type 2 diabetes mellitus with stage 2 chronic kidney disease, without long-term current use of insulin (HCC) -A1C has been well controlled Cont meds -diet and exercise - Hemoglobin A1c  4. Hyperlipidemia -cont diet and exercise -do recommend medication which patient refuses currently - Lipid panel  5. Vitamin D deficiency -cont supplement  6. Medication management  - CBC with Differential/Platelet - BASIC METABOLIC PANEL WITH GFR - Hepatic function panel  7. Gastroesophageal reflux disease, esophagitis presence not specified -cont meds prn -avoid trigger foods   8. IBS (irritable bowel syndrome) -avoid trigger foods -cont adequate treatment of depression and anxiety  9. 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 Depression with anxiety -cont meds -well controlled on current therapy  12. Insomnia -try adding in more exercise -use xanax sparingly for bedtime  13. BMI 27.0-27.9,adult -cont diet and exercise     Over 30 minutes of exam, counseling, chart review, and critical decision making was performed  Future Appointments Date Time Provider [place]  06/16/2016 11:15 AM Unk [name], MD [facility] None    Plan:   During the course of the visit the patient was educated and counseled about appropriate screening and preventive services including:    Pneumococcal  vaccine   Influenza vaccine  Td vaccine  Prevnar 13  Screening electrocardiogram  Screening mammography  Bone densitometry screening  Colorectal cancer screening  Diabetes screening  Glaucoma screening  Nutrition counseling   Advanced directives: given info/requested copies   Subjective:   Debra Short is a 75 y.o. female who presents for Medicare Annual Wellness Visit and 3 month follow up on hypertension, prediabetes, hyperlipidemia, vitamin D def.   Her blood pressure has been controlled at home, today their BP is BP: 126/60 mmHg She does not workout. She denies chest pain, shortness of breath, dizziness. She will occasionally get some walking in around the house or do stretches but is not formally exercising.    She is not on cholesterol medication and denies myalgias. Her cholesterol is not at goal. The cholesterol last visit was:  Lab Results  Component Value Date   CHOL 206* 09/08/2015   HDL 47 09/08/2015   LDLCALC 128 09/08/2015   TRIG 154* 09/08/2015   CHOLHDL 4.4 09/08/2015   She has been working on diet.  She reports eating plenty of fruits and veggies.  She does not check her sugars ever.    Lab Results  Component Value Date   HGBA1C 6.1* 10/04/2015   Last GFR Lab Results  Component Value Date   GFRNONAA 59* 10/14/2015   Lab Results  Component Value Date   GFRAA 69 10/14/2015   Patient is on Vitamin D supplement. Lab Results  Component Value Date   VD25OH 90 09/08/2015     She reports that she is down 14 lbs.  She has cut back on starches and carbs and has replaced it  with protein. She is not on cholesterol  medications.  She does not want to take any.  She otherwise has no complaints today.  Her allergies are doing well.  She does have joint pains but expects this is due to her age.  Her depression and anxiety is well controlled.   She is really only taking xanax to help her sleep.   Medication Review Current Outpatient Prescriptions on  File Prior to Visit  Medication Sig Dispense Refill  . acetaminophen (TYLENOL) 650 MG CR tablet Take 650 mg by mouth every 8 (eight) hours as needed for pain.    Marland Kitchen ALPRAZolam (XANAX) 1 MG tablet Take 1/2 to 1 tablet 2 to 3 x/daily as needed for anxiety (Patient taking differently: Take 0.5-1 mg by mouth See admin instructions. Take 1 tablet (1 mg) by mouth daily at bedtime, may also take 1/2 - 1 tablet (0.5 mg-1 mg) twice daily as needed for anxiety) 270 tablet 1  . aspirin EC 81 MG tablet Take 81 mg by mouth daily.    . bisoprolol (ZEBETA) 5 MG tablet Take 1 tablet every morning for BP 90 tablet 1  . Cholecalciferol (VITAMIN D3) 5000 units TABS Take 10,000 Units by mouth daily.    . Esomeprazole Magnesium (NEXIUM PO) Take 22.3 mg by mouth See admin instructions. Take 1 capsule (22.3 mg OTC) by mouth every morning before breakfast, may also take 1 capsule in the evening as needed for acid reflux    . FLUoxetine (PROZAC) 40 MG capsule TAKE 1 CAPSULE (40 MG TOTAL) BY MOUTH DAILY. 90 capsule 1  . gabapentin (NEURONTIN) 300 MG capsule Take 2 capsules (600 mg total) by mouth at bedtime.    . metFORMIN (GLUCOPHAGE-XR) 500 MG 24 hr tablet TAKE 1 TABLET EVERY DAY WITH BREAKFAST 90 tablet 1  . montelukast (SINGULAIR) 10 MG tablet TAKE 1 TABLET EVERY DAY AS NEEDED FOR ALLERGIES (Patient taking differently: TAKE 1 TABLET BY MOUTH AT BEDTIME AS NEEDED FOR SEASONAL ALLERGIES) 90 tablet 2  . Multiple Vitamin (MULTIVITAMIN WITH MINERALS) TABS tablet Take 1 tablet by mouth daily.    . rizatriptan (MAXALT) 10 MG tablet Take 10 mg by mouth daily as needed for migraine.   1  . triamcinolone (NASACORT) 55 MCG/ACT AERO nasal inhaler Place 2 sprays into both nostrils daily as needed (for seasonal allergies).      No current facility-administered medications on file prior to visit.    Allergies: Allergies  Allergen Reactions  . Effexor [Venlafaxine] Other (See Comments)    Not effective  . Nortriptyline Other (See  Comments)    Not effective  . Paxil [Paroxetine Hcl] Other (See Comments)    Not effective  . Robaxin [Methocarbamol] Itching  . Tizanidine Itching  . 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  Screening Tests Immunization History  Administered Date(s) Administered  . Influenza Split 04/10/2013  . Influenza, High Dose Seasonal PF 03/24/2014, 02/27/2015  . Pneumococcal Conjugate-13 03/24/2014  . Pneumococcal Polysaccharide-23 06/13/2008  . Td 09/24/2012  . Tdap 09/26/2014  . Zoster 02/23/2011    Preventative care: Last colonoscopy: 2013 Last mammogram:  09/2014  Prior vaccinations: TD or Tdap: 2016  Influenza: 2016  Pneumococcal: 2010 Prevnar13: 2015 Shingles/Zostavax: 2012  Names of Other Physician/Practitioners you currently use: 1. [facility]- here for primary care 2. [facility], eye doctor, last visit scheduled next month 3. Dr. Orion Crook, dentist, last visit q20months Patient Care Team: Unk [name], MD as PCP - General (Internal Medicine) Bo Merino, MD as Consulting Physician (Rheumatology) [name], MD as Consulting Physician (Gastroenterology) Allyn Kenner, MD (Dermatology)  Surgical: She  has past surgical history that includes ORIF ankle fracture (Left, 09/26/2014); Abdominal hysterectomy; Tonsillectomy; and ORIF ankle fracture (Left, 09/26/2014). Family Her family history includes Cancer in her brother, father,  maternal grandfather, and mother. Social history  She reports that she has never smoked. She has never used smokeless tobacco. She reports that she does not drink alcohol or use illicit drugs.  MEDICARE WELLNESS OBJECTIVES: Physical activity:   Cardiac risk factors:   Depression/mood screen:   Depression screen [facility] 2/9 10/14/2015  Decreased Interest 0  Down, Depressed, Hopeless 0  PHQ - 2 Score 0    ADLs:  In your present state of health, do you have any difficulty performing the following activities: 10/14/2015 10/03/2015  Hearing? Tempie Donning  Vision? N N  Difficulty concentrating or making decisions? N N  Walking or climbing stairs? N N  Dressing or bathing? N N  Doing errands, shopping? N N     Cognitive Testing  Alert? Yes  Normal Appearance?Yes  Oriented to person? Yes  Place? Yes   Time? Yes  Recall of three objects?  Yes  Can perform simple calculations? Yes  Displays appropriate judgment?Yes  Can read the correct time from a watch face?Yes  EOL planning:     Objective:   Today's Vitals   12/10/15 1413  BP: 126/60  Pulse: 72  Temp: 98.2 F (36.8 C)  TempSrc: Temporal  Resp: 18  Height: 5\' 5"  (1.651 m)  Weight: 165 lb (74.844 kg)   Body mass index is 27.46 kg/(m^2). Wt Readings from Last 3 Encounters:  12/10/15 165 lb (74.844 kg)  10/14/15 170 lb 9.6 oz (77.384 kg)  10/03/15 179 lb 11.2 oz (81.511 kg)    General appearance: alert, no distress, WD/WN,  female HEENT: normocephalic, sclerae anicteric, TMs pearly, nares patent, no discharge or erythema, pharynx normal Oral cavity: MMM, no lesions Neck: supple, no lymphadenopathy, no thyromegaly, no masses Heart: RRR, normal S1, S2, no murmurs Lungs: CTA bilaterally, no wheezes, rhonchi, or rales Abdomen: +bs, soft, non tender, non distended, no masses, no hepatomegaly, no splenomegaly Musculoskeletal: nontender, no swelling, no obvious deformity Extremities: no edema, no cyanosis, no clubbing Pulses: 2+ symmetric,  upper and lower extremities, normal cap refill Neurological: alert, oriented x 3, CN2-12 intact, strength normal upper extremities and lower extremities, sensation normal throughout, DTRs 2+ throughout, no cerebellar signs, gait normal Psychiatric: normal affect, behavior normal, pleasant  Breast: defer Gyn: defer Rectal: defer   Medicare Attestation I have personally reviewed: The patient's medical and social history Their use of alcohol, tobacco or illicit drugs Their current medications and supplements The patient's functional ability including ADLs,fall risks, home safety risks, cognitive, and hearing and visual impairment Diet and physical activities Evidence for depression or mood disorders  The patient's weight, height, BMI, and visual acuity have been recorded in the chart.  I have made referrals, counseling, and provided education to the patient based on review of the above and I have provided the patient with a written  personalized care plan for preventive services.     Starlyn Skeans, PA-C   12/10/2015

## 2015-12-10 NOTE — Patient Instructions (Signed)
Please try using tumeric with curcumin(this is the scientific name for black pepper extract.)  Take it twice daily with food to  Help with arthritis pain.  You can also try using melatonin 5-10 mg 30 minutes prior to bedtime to help you fall asleep and stay asleep.  You can also take 25-50 mg of benadryl to help you fall asleep.    Keep up the great work with your diet.  We are very proud of you!!!!!

## 2015-12-11 LAB — HEPATIC FUNCTION PANEL
ALT: 17 U/L (ref 6–29)
AST: 17 U/L (ref 10–35)
Albumin: 4.4 g/dL (ref 3.6–5.1)
Alkaline Phosphatase: 71 U/L (ref 33–130)
Bilirubin, Direct: 0.1 mg/dL (ref <=0.2)
Indirect Bilirubin: 0.5 mg/dL (ref 0.2–1.2)
Total Bilirubin: 0.6 mg/dL (ref 0.2–1.2)
Total Protein: 7.1 g/dL (ref 6.1–8.1)

## 2015-12-11 LAB — BASIC METABOLIC PANEL WITH GFR
BUN: 16 mg/dL (ref 7–25)
CO2: 30 mmol/L (ref 20–31)
Calcium: 9.8 mg/dL (ref 8.6–10.4)
Chloride: 98 mmol/L (ref 98–110)
Creat: 0.99 mg/dL — ABNORMAL HIGH (ref 0.60–0.93)
GFR, Est African American: 64 mL/min (ref >=60)
GFR, Est Non African American: 55 mL/min — ABNORMAL LOW (ref >=60)
Glucose, Bld: 95 mg/dL (ref 65–99)
Potassium: 5 mmol/L (ref 3.5–5.3)
Sodium: 137 mmol/L (ref 135–146)

## 2015-12-11 LAB — LIPID PANEL
Cholesterol: 235 mg/dL — ABNORMAL HIGH (ref 125–200)
HDL: 42 mg/dL — ABNORMAL LOW (ref >=46)
LDL Cholesterol: 147 mg/dL — ABNORMAL HIGH (ref <130)
Total CHOL/HDL Ratio: 5.6 Ratio — ABNORMAL HIGH (ref <=5.0)
Triglycerides: 229 mg/dL — ABNORMAL HIGH (ref <150)
VLDL: 46 mg/dL — ABNORMAL HIGH (ref <30)

## 2015-12-11 LAB — HEMOGLOBIN A1C
Hgb A1c MFr Bld: 5.8 % — ABNORMAL HIGH (ref <5.7)
Mean Plasma Glucose: 120 mg/dL

## 2015-12-11 LAB — TSH: TSH: 2.05 mIU/L

## 2015-12-17 DIAGNOSIS — Z5181 Encounter for therapeutic drug level monitoring: Secondary | ICD-10-CM | POA: Diagnosis not present

## 2016-01-27 DIAGNOSIS — G4709 Other insomnia: Secondary | ICD-10-CM | POA: Diagnosis not present

## 2016-01-27 DIAGNOSIS — M174 Other bilateral secondary osteoarthritis of knee: Secondary | ICD-10-CM | POA: Diagnosis not present

## 2016-01-27 DIAGNOSIS — M542 Cervicalgia: Secondary | ICD-10-CM | POA: Diagnosis not present

## 2016-01-27 DIAGNOSIS — M19241 Secondary osteoarthritis, right hand: Secondary | ICD-10-CM | POA: Diagnosis not present

## 2016-01-27 DIAGNOSIS — M797 Fibromyalgia: Secondary | ICD-10-CM | POA: Diagnosis not present

## 2016-02-14 ENCOUNTER — Other Ambulatory Visit: Payer: Self-pay | Admitting: Internal Medicine

## 2016-02-29 ENCOUNTER — Encounter: Payer: Self-pay | Admitting: Internal Medicine

## 2016-03-23 ENCOUNTER — Encounter: Payer: Self-pay | Admitting: Internal Medicine

## 2016-03-23 ENCOUNTER — Other Ambulatory Visit: Payer: Self-pay | Admitting: Internal Medicine

## 2016-03-23 DIAGNOSIS — Z23 Encounter for immunization: Secondary | ICD-10-CM | POA: Diagnosis not present

## 2016-03-23 DIAGNOSIS — F411 Generalized anxiety disorder: Secondary | ICD-10-CM

## 2016-03-31 DIAGNOSIS — E119 Type 2 diabetes mellitus without complications: Secondary | ICD-10-CM | POA: Diagnosis not present

## 2016-04-07 ENCOUNTER — Other Ambulatory Visit: Payer: Self-pay | Admitting: Internal Medicine

## 2016-04-07 DIAGNOSIS — I10 Essential (primary) hypertension: Secondary | ICD-10-CM

## 2016-04-12 DIAGNOSIS — H5340 Unspecified visual field defects: Secondary | ICD-10-CM | POA: Diagnosis not present

## 2016-04-14 ENCOUNTER — Ambulatory Visit (INDEPENDENT_AMBULATORY_CARE_PROVIDER_SITE_OTHER): Payer: Medicare Other | Admitting: Internal Medicine

## 2016-04-14 ENCOUNTER — Encounter: Payer: Self-pay | Admitting: Internal Medicine

## 2016-04-14 VITALS — BP 138/92 | HR 68 | Temp 98.0°F | Resp 16 | Ht 65.0 in | Wt 173.0 lb

## 2016-04-14 DIAGNOSIS — C8580 Other specified types of non-Hodgkin lymphoma, unspecified site: Secondary | ICD-10-CM | POA: Diagnosis not present

## 2016-04-14 DIAGNOSIS — E559 Vitamin D deficiency, unspecified: Secondary | ICD-10-CM | POA: Diagnosis not present

## 2016-04-14 DIAGNOSIS — I10 Essential (primary) hypertension: Secondary | ICD-10-CM

## 2016-04-14 DIAGNOSIS — Z79899 Other long term (current) drug therapy: Secondary | ICD-10-CM

## 2016-04-14 DIAGNOSIS — E782 Mixed hyperlipidemia: Secondary | ICD-10-CM | POA: Diagnosis not present

## 2016-04-14 DIAGNOSIS — N182 Chronic kidney disease, stage 2 (mild): Secondary | ICD-10-CM

## 2016-04-14 DIAGNOSIS — E1122 Type 2 diabetes mellitus with diabetic chronic kidney disease: Secondary | ICD-10-CM

## 2016-04-14 LAB — CBC WITH DIFFERENTIAL/PLATELET
Basophils Absolute: 73 cells/uL (ref 0–200)
Basophils Relative: 1 %
Eosinophils Absolute: 146 cells/uL (ref 15–500)
Eosinophils Relative: 2 %
HCT: 39.8 % (ref 35.0–45.0)
Hemoglobin: 12.8 g/dL (ref 11.7–15.5)
Lymphocytes Relative: 27 %
Lymphs Abs: 1971 cells/uL (ref 850–3900)
MCH: 30.8 pg (ref 27.0–33.0)
MCHC: 32.2 g/dL (ref 32.0–36.0)
MCV: 95.9 fL (ref 80.0–100.0)
MPV: 9.1 fL (ref 7.5–12.5)
Monocytes Absolute: 1022 cells/uL — ABNORMAL HIGH (ref 200–950)
Monocytes Relative: 14 %
Neutro Abs: 4088 cells/uL (ref 1500–7800)
Neutrophils Relative %: 56 %
Platelets: 280 10*3/uL (ref 140–400)
RBC: 4.15 MIL/uL (ref 3.80–5.10)
RDW: 14.2 % (ref 11.0–15.0)
WBC: 7.3 10*3/uL (ref 3.8–10.8)

## 2016-04-14 NOTE — Patient Instructions (Addendum)
[facility] [address] [place] [address] [phone]
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
## 2016-04-15 LAB — HEPATIC FUNCTION PANEL
ALT: 16 U/L (ref 6–29)
AST: 16 U/L (ref 10–35)
Albumin: 4.2 g/dL (ref 3.6–5.1)
Alkaline Phosphatase: 64 U/L (ref 33–130)
Bilirubin, Direct: 0.2 mg/dL (ref <=0.2)
Indirect Bilirubin: 0.6 mg/dL (ref 0.2–1.2)
Total Bilirubin: 0.8 mg/dL (ref 0.2–1.2)
Total Protein: 6.9 g/dL (ref 6.1–8.1)

## 2016-04-15 LAB — BASIC METABOLIC PANEL WITH GFR
BUN: 13 mg/dL (ref 7–25)
CO2: 33 mmol/L — ABNORMAL HIGH (ref 20–31)
Calcium: 9.5 mg/dL (ref 8.6–10.4)
Chloride: 98 mmol/L (ref 98–110)
Creat: 0.85 mg/dL (ref 0.60–0.93)
GFR, Est African American: 76 mL/min (ref >=60)
GFR, Est Non African American: 66 mL/min (ref >=60)
Glucose, Bld: 81 mg/dL (ref 65–99)
Potassium: 4.7 mmol/L (ref 3.5–5.3)
Sodium: 139 mmol/L (ref 135–146)

## 2016-04-15 LAB — LIPID PANEL
Cholesterol: 133 mg/dL (ref 125–200)
HDL: 45 mg/dL — ABNORMAL LOW (ref >=46)
LDL Cholesterol: 62 mg/dL (ref <130)
Total CHOL/HDL Ratio: 3 Ratio (ref <=5.0)
Triglycerides: 131 mg/dL (ref <150)
VLDL: 26 mg/dL (ref <30)

## 2016-04-27 DIAGNOSIS — H26492 Other secondary cataract, left eye: Secondary | ICD-10-CM | POA: Diagnosis not present

## 2016-05-03 ENCOUNTER — Other Ambulatory Visit: Payer: Self-pay | Admitting: Internal Medicine

## 2016-05-05 ENCOUNTER — Other Ambulatory Visit: Payer: Self-pay | Admitting: Internal Medicine

## 2016-05-12 ENCOUNTER — Other Ambulatory Visit: Payer: Self-pay | Admitting: Internal Medicine

## 2016-05-14 ENCOUNTER — Encounter: Payer: Self-pay | Admitting: *Deleted

## 2016-05-17 ENCOUNTER — Telehealth: Payer: Self-pay | Admitting: Rheumatology

## 2016-05-17 ENCOUNTER — Other Ambulatory Visit: Payer: Self-pay | Admitting: Rheumatology

## 2016-05-17 MED ORDER — HYDROCODONE-ACETAMINOPHEN 5-325 MG PO TABS: 1.0000 | ORAL_TABLET | Freq: Two times a day (BID) | ORAL | 0 refills | Status: DC | PRN

## 2016-05-17 NOTE — Telephone Encounter (Signed)
Patient left message requesting a hydrocodone refill.

## 2016-05-17 NOTE — Telephone Encounter (Signed)
ok 

## 2016-05-17 NOTE — Telephone Encounter (Signed)
LMOM for patient to call back to schedule appointment.  

## 2016-05-17 NOTE — Telephone Encounter (Signed)
-----   Message from Carole Binning, LPN sent at [id number] 11:07 AM EST ----- Regarding: Please schedule follow up appointment Please schedule follow up appointment. Patient is due for appointment in February 2018. Thanks!!

## 2016-05-17 NOTE — Telephone Encounter (Signed)
Last Visit: 01/27/16 Next Visit due February 2018. Message sent to the front to schedule patient. UDS: 12/21/15 Narc Agreement: 01/28/16  Okay to refill Hydrocodone?

## 2016-05-17 NOTE — Telephone Encounter (Signed)
Left message to advise prescription ready for pick up

## 2016-06-16 ENCOUNTER — Ambulatory Visit: Payer: Self-pay | Admitting: Internal Medicine

## 2016-07-07 ENCOUNTER — Ambulatory Visit (INDEPENDENT_AMBULATORY_CARE_PROVIDER_SITE_OTHER): Payer: Medicare Other | Admitting: Internal Medicine

## 2016-07-07 ENCOUNTER — Encounter: Payer: Self-pay | Admitting: Internal Medicine

## 2016-07-07 VITALS — BP 108/60 | HR 64 | Temp 97.5°F | Resp 16 | Ht 65.0 in | Wt 165.4 lb

## 2016-07-07 DIAGNOSIS — Z79899 Other long term (current) drug therapy: Secondary | ICD-10-CM | POA: Diagnosis not present

## 2016-07-07 DIAGNOSIS — Z1212 Encounter for screening for malignant neoplasm of rectum: Secondary | ICD-10-CM

## 2016-07-07 DIAGNOSIS — N182 Chronic kidney disease, stage 2 (mild): Secondary | ICD-10-CM | POA: Diagnosis not present

## 2016-07-07 DIAGNOSIS — Z136 Encounter for screening for cardiovascular disorders: Secondary | ICD-10-CM

## 2016-07-07 DIAGNOSIS — K219 Gastro-esophageal reflux disease without esophagitis: Secondary | ICD-10-CM

## 2016-07-07 DIAGNOSIS — E782 Mixed hyperlipidemia: Secondary | ICD-10-CM | POA: Diagnosis not present

## 2016-07-07 DIAGNOSIS — E1122 Type 2 diabetes mellitus with diabetic chronic kidney disease: Secondary | ICD-10-CM | POA: Diagnosis not present

## 2016-07-07 DIAGNOSIS — I10 Essential (primary) hypertension: Secondary | ICD-10-CM

## 2016-07-07 DIAGNOSIS — E559 Vitamin D deficiency, unspecified: Secondary | ICD-10-CM | POA: Diagnosis not present

## 2016-07-07 LAB — CBC WITH DIFFERENTIAL/PLATELET
Basophils Absolute: 58 cells/uL (ref 0–200)
Basophils Relative: 1 %
Eosinophils Absolute: 174 cells/uL (ref 15–500)
Eosinophils Relative: 3 %
HCT: 39.7 % (ref 35.0–45.0)
Hemoglobin: 12.9 g/dL (ref 11.7–15.5)
Lymphocytes Relative: 28 %
Lymphs Abs: 1624 cells/uL (ref 850–3900)
MCH: 31.2 pg (ref 27.0–33.0)
MCHC: 32.5 g/dL (ref 32.0–36.0)
MCV: 96.1 fL (ref 80.0–100.0)
MPV: 9.6 fL (ref 7.5–12.5)
Monocytes Absolute: 696 cells/uL (ref 200–950)
Monocytes Relative: 12 %
Neutro Abs: 3248 cells/uL (ref 1500–7800)
Neutrophils Relative %: 56 %
Platelets: 300 10*3/uL (ref 140–400)
RBC: 4.13 MIL/uL (ref 3.80–5.10)
RDW: 13.4 % (ref 11.0–15.0)
WBC: 5.8 10*3/uL (ref 3.8–10.8)

## 2016-07-07 LAB — TSH: TSH: 2.75 mIU/L

## 2016-07-07 NOTE — Progress Notes (Signed)
[facility]   Unk [name], M.D.    Uvaldo Bristle. Silverio Lay, P.A.-C      Starlyn Skeans, P.A.-C  [facility]                [address] [place] [id number] Telephone [phone] Telefax [phone] Comprehensive Evaluation & Examination     This very nice 76 y.o. [facility] presents for a comprehensive evaluation and management of multiple medical co-morbidities.  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     HTN predates since 2012. Patient's BP has been controlled at home. Cardiolite testing in 2008 was Negative.  Today's BP is at goal - 108/60. Patient denies any cardiac symptoms as chest pain, palpitations, shortness of breath, dizziness or ankle swelling.     Patient's hyperlipidemia is controlled with diet and medications. Patient denies myalgias or other medication SE's. 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  She does not monitor CBG's. She denies reactive hypoglycemic symptoms, visual blurring, diabetic polys or paresthesias. Last A1c was near goal: Lab Results  Component Value Date   HGBA1C 5.8 (H) 12/10/2015       Finally, patient has history of Vitamin D Deficiency in 2009 of "25"  and last vitamin D was near goal (70-100): Lab Results  Component Value Date   VD25OH 54 09/08/2015   Current Outpatient Prescriptions on File Prior to Visit  Medication Sig  . Acetaminophen 650 MG CR  Take 650 mg  every 8  hours as needed for pain.  Marland Kitchen ALPRAZolam  1 MG tablet TAKE 1/2-1 TAB 2 TO 3 TIMES DAILY AS NEEDED   . aspirin EC 81 MG  Take  daily.  Marland Kitchen atorvastatin  80 MG  TAKE 1/3 TAB  .  bisoprolol  5 MG tablet TAKE 1 TABLET EVERY MORNING FSURE  . VITAMIN D 5000 units  Take 10,000 Units  daily.  Marland Kitchen NEXIUM  22.3 mg  Take 1 cap every morning before breakfast, may also take 1 cap in the evening as needed for acid reflux  . FML 0.1 % opth susp   . FLUoxetine 40 MG capsule TAKE 1 CAP DAILY.  Marland Kitchen gabapentin 300 MG capsule Take 2 caps at bedtime.  . [id number] MG tablet Take 1 tab 2  times daily as needed for moderate pain.  . metFORMIN-XR 500 MG  TAKE 1 TAB EVERY DAY WITH BREAKFAST  . montelukast  10 MG tablet TAKE 1 TAB EVERY DAY AS NEEDED FOR ALLERGIES  . Multi-Vit/Min Take 1 tab daily.  . rizatriptan (MAXALT) 10 MG  Take 1 daily as needed for migraine.   Marland Kitchen NASACORT 2 sprays into both nostrils daily as needed   Allergies  Allergen Reactions  . Effexor [Venlafaxine] Other (See Comments)    Not effective  . Nortriptyline Other (See Comments)    Not effective  . Paxil [Paroxetine Hcl] Other (See Comments)    Not effective  . Robaxin [Methocarbamol] Itching  . Tizanidine Itching  . Zoloft [Sertraline Hcl] Other (See  Comments)    Not effective   Past Medical History:  Diagnosis Date  . Fibromyalgia   . GERD (gastroesophageal reflux disease)   . Hyperlipidemia   . Hypertension   . IBS (irritable bowel syndrome)   . 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 Vitamin D deficiency    Health Maintenance  Topic Date Due  . URINE MICROALBUMIN  10/10/2014  . FOOT EXAM  02/27/2016  . HEMOGLOBIN A1C  06/10/2016  . DEXA SCAN  01/11/2017 (Originally 09/20/2003)  . OPHTHALMOLOGY EXAM  04/12/2017  . TETANUS/TDAP  09/25/2024  . INFLUENZA VACCINE  Addressed  . ZOSTAVAX  Completed  . PNA vac Low Risk Adult  Completed   Immunization History  Administered Date(s) Administered  . Influenza Split 04/10/2013, 03/23/2016  . Influenza, High Dose Seasonal PF 03/24/2014, 02/27/2015  . Pneumococcal Conjugate-13 03/24/2014  . Pneumococcal Polysaccharide-23 06/13/2008  . Td 09/24/2012  . Tdap  09/26/2014  . Zoster 02/23/2011   Past Surgical History:  Procedure Laterality Date  . ABDOMINAL HYSTERECTOMY    . ORIF ANKLE FRACTURE Left 09/26/2014  . ORIF ANKLE FRACTURE Left 09/26/2014   Procedure: OPEN REDUCTION INTERNAL FIXATION (ORIF) ANKLE FRACTURE;  Surgeon: [name], MD;  Location: [place];  Service: Orthopedics;  Laterality: Left;  . TONSILLECTOMY     Family History  Problem Relation Age of Onset  . Cancer Mother   . Cancer Father   . Cancer Brother   . Cancer Maternal Grandfather    Social History   Social History  . Marital status: Widowed    Spouse name: N/A  . Number of children: N/A  . Years of education: N/A   Occupational History  . Retired   Social History Main Topics  . Smoking status: Never Smoker  . Smokeless tobacco: Never Used  . Alcohol use No  . Drug use: No  . Sexual activity: No    ROS Constitutional: Denies fever, chills, weight loss/gain, headaches, insomnia,  night sweats or change in appetite. Does c/o fatigue. Eyes: Denies redness, blurred vision, diplopia, discharge, itchy or watery eyes.  ENT: Denies discharge, congestion, post nasal drip, epistaxis, sore throat, earache, hearing loss, dental pain, Tinnitus, Vertigo, Sinus pain or snoring.  Cardio: Denies chest pain, palpitations, irregular heartbeat, syncope, dyspnea, diaphoresis, orthopnea, PND, claudication or edema Respiratory: denies cough, dyspnea, DOE, pleurisy, hoarseness, laryngitis or wheezing.  Gastrointestinal: Denies dysphagia, heartburn, reflux, water brash, pain, cramps, nausea, vomiting, bloating, diarrhea, constipation, hematemesis, melena, hematochezia, jaundice or hemorrhoids Genitourinary: Denies dysuria, frequency, urgency, nocturia, hesitancy, discharge, hematuria or flank pain Musculoskeletal: Denies arthralgia, myalgia, stiffness, Jt. Swelling, pain, limp or strain/sprain. Denies Falls. Skin: Denies puritis, rash, hives, warts, acne, eczema or change in skin  lesion Neuro: No weakness, tremor, incoordination, spasms, paresthesia or pain Psychiatric: Denies confusion, memory loss or sensory loss. Denies Depression. Endocrine: Denies change in weight, skin, hair change, nocturia, and paresthesia, diabetic polys, visual blurring or hyper / hypo glycemic episodes.  Heme/Lymph: No excessive bleeding, bruising or enlarged lymph nodes.  Physical Exam  BP 108/60   Pulse 64   Temp 97.5 F (36.4 C)   Resp 16   Ht 5\' 5"  (1.651 m)   Wt 165 lb 6.4 oz (75 kg)   BMI 27.52 kg/m   General Appearance: Well nourished, in no apparent distress.  Eyes: PERRLA, EOMs, conjunctiva no swelling or erythema, normal fundi and vessels. Sinuses: No frontal/maxillary tenderness ENT/Mouth: EACs patent / TMs  nl. Nares clear without erythema, swelling, mucoid exudates. Oral  hygiene is good. No erythema, swelling, or exudate. Tongue normal, non-obstructing. Tonsils not swollen or erythematous. Hearing normal.  Neck: Supple, thyroid normal. No bruits, nodes or JVD. Respiratory: Respiratory effort normal.  BS equal and clear bilateral without rales, rhonci, wheezing or stridor. Cardio: Heart sounds are normal with regular rate and rhythm and no murmurs, rubs or gallops. Peripheral pulses are normal and equal bilaterally without edema. No aortic or femoral bruits. Chest: symmetric with normal excursions and percussion.  Breasts - No masses Abdomen: Soft, with Nl bowel sounds. Nontender, no guarding, rebound, hernias, masses, or organomegaly.  Lymphatics: Non tender without lymphadenopathy. Musculoskeletal: Full ROM all peripheral extremities, joint stability, 5/5 strength, and normal gait. Skin: Warm and dry without rashes, lesions, cyanosis, clubbing or  ecchymosis.  Neuro: Cranial nerves intact, reflexes equal bilaterally. Normal muscle tone, no cerebellar symptoms. Sensation intact to touch , vibratory and Monofilament testing to the toes bilaterally.  Pysch: Alert and  oriented X 3 with normal affect, insight and judgment appropriate.   Assessment and Plan  1. Essential hypertension  - Microalbumin / creatinine urine ratio - EKG 12-Lead - Urinalysis, Routine w reflex microscopic - BASIC METABOLIC PANEL WITH GFR - CBC with Differential/Platelet - TSH  2. Mixed hyperlipidemia  - EKG 12-Lead - Hepatic function panel - Lipid panel - TSH  3. T2_NIDDM w/CKD2  ([place])  - Microalbumin / creatinine urine ratio - EKG 12-Lead - HM DIABETES FOOT EXAM - LOW EXTREMITY NEUR EXAM DOCUM - Hemoglobin A1c - Insulin, random  4. Vitamin D deficiency  - VITAMIN D 25 Hydroxy   5. Gastroesophageal reflux disease   6. Screening for rectal cancer  - POC Hemoccult Bld/Stl   7. Screening for ischemic heart disease  - EKG 12-Lead  8. Medication management  - Urinalysis, Routine w reflex microscopic - BASIC METABOLIC PANEL WITH GFR - CBC with Differential/Platelet - Hepatic function panel - Magnesium - TSH - Hemoglobin A1c - Insulin, random - VITAMIN D 25 Hydroxy        Continue prudent diet as discussed, weight control, BP monitoring, regular exercise, and medications as discussed.  Discussed med effects and SE's. Routine screening labs and tests as requested with regular follow-up as recommended. Over 40 minutes of exam, counseling, chart review and high complex critical decision making was performed

## 2016-07-07 NOTE — Patient Instructions (Signed)

## 2016-07-08 LAB — HEPATIC FUNCTION PANEL
ALT: 15 U/L (ref 6–29)
AST: 16 U/L (ref 10–35)
Albumin: 3.9 g/dL (ref 3.6–5.1)
Alkaline Phosphatase: 71 U/L (ref 33–130)
Bilirubin, Direct: 0.1 mg/dL (ref <=0.2)
Indirect Bilirubin: 0.5 mg/dL (ref 0.2–1.2)
Total Bilirubin: 0.6 mg/dL (ref 0.2–1.2)
Total Protein: 6.7 g/dL (ref 6.1–8.1)

## 2016-07-08 LAB — URINALYSIS, ROUTINE W REFLEX MICROSCOPIC
Bilirubin Urine: NEGATIVE
Glucose, UA: NEGATIVE
Hgb urine dipstick: NEGATIVE
Ketones, ur: NEGATIVE
Nitrite: NEGATIVE
Protein, ur: NEGATIVE
Specific Gravity, Urine: 1.017 (ref 1.001–1.035)
pH: 6 (ref 5.0–8.0)

## 2016-07-08 LAB — MAGNESIUM: Magnesium: 2.1 mg/dL (ref 1.5–2.5)

## 2016-07-08 LAB — BASIC METABOLIC PANEL WITH GFR
BUN: 15 mg/dL (ref 7–25)
CO2: 29 mmol/L (ref 20–31)
Calcium: 9.5 mg/dL (ref 8.6–10.4)
Chloride: 103 mmol/L (ref 98–110)
Creat: 0.78 mg/dL (ref 0.60–0.93)
GFR, Est African American: 85 mL/min (ref >=60)
GFR, Est Non African American: 74 mL/min (ref >=60)
Glucose, Bld: 99 mg/dL (ref 65–99)
Potassium: 5 mmol/L (ref 3.5–5.3)
Sodium: 142 mmol/L (ref 135–146)

## 2016-07-08 LAB — MICROALBUMIN / CREATININE URINE RATIO
Creatinine, Urine: 162 mg/dL (ref 20–320)
Microalb Creat Ratio: 4 mcg/mg creat (ref <30)
Microalb, Ur: 0.7 mg/dL

## 2016-07-08 LAB — HEMOGLOBIN A1C
Hgb A1c MFr Bld: 5.5 % (ref <5.7)
Mean Plasma Glucose: 111 mg/dL

## 2016-07-08 LAB — LIPID PANEL
Cholesterol: 120 mg/dL (ref <200)
HDL: 37 mg/dL — ABNORMAL LOW (ref >50)
LDL Cholesterol: 49 mg/dL (ref <100)
Total CHOL/HDL Ratio: 3.2 Ratio (ref <5.0)
Triglycerides: 172 mg/dL — ABNORMAL HIGH (ref <150)
VLDL: 34 mg/dL — ABNORMAL HIGH (ref <30)

## 2016-07-08 LAB — URINALYSIS, MICROSCOPIC ONLY
Casts: NONE SEEN [LPF]
Crystals: NONE SEEN [HPF]
RBC / HPF: NONE SEEN RBC/HPF (ref <=2)
Yeast: NONE SEEN [HPF]

## 2016-07-08 LAB — VITAMIN D 25 HYDROXY (VIT D DEFICIENCY, FRACTURES): Vit D, 25-Hydroxy: 74 ng/mL (ref 30–100)

## 2016-07-08 LAB — INSULIN, RANDOM: Insulin: 20.5 u[IU]/mL — ABNORMAL HIGH (ref 2.0–19.6)

## 2016-07-20 ENCOUNTER — Ambulatory Visit: Payer: Self-pay | Admitting: Rheumatology

## 2016-07-20 DIAGNOSIS — M51369 Other intervertebral disc degeneration, lumbar region without mention of lumbar back pain or lower extremity pain: Secondary | ICD-10-CM | POA: Insufficient documentation

## 2016-07-20 DIAGNOSIS — M170 Bilateral primary osteoarthritis of knee: Secondary | ICD-10-CM | POA: Insufficient documentation

## 2016-07-20 DIAGNOSIS — M797 Fibromyalgia: Secondary | ICD-10-CM | POA: Insufficient documentation

## 2016-07-20 DIAGNOSIS — M5136 Other intervertebral disc degeneration, lumbar region: Secondary | ICD-10-CM | POA: Insufficient documentation

## 2016-07-20 DIAGNOSIS — M19042 Primary osteoarthritis, left hand: Secondary | ICD-10-CM

## 2016-07-20 DIAGNOSIS — M19041 Primary osteoarthritis, right hand: Secondary | ICD-10-CM | POA: Insufficient documentation

## 2016-08-02 ENCOUNTER — Other Ambulatory Visit: Payer: Self-pay | Admitting: Rheumatology

## 2016-08-02 MED ORDER — HYDROCODONE-ACETAMINOPHEN 5-325 MG PO TABS: 1.0000 | ORAL_TABLET | Freq: Two times a day (BID) | ORAL | 0 refills | Status: DC | PRN

## 2016-08-02 NOTE — Addendum Note (Signed)
Addended by: Carole Binning on: 08/02/2016 03:47 PM   Modules accepted: Orders

## 2016-08-02 NOTE — Telephone Encounter (Signed)
Patient called and made appointment with Mr. Carlyon Shadow on March 16 @ 10:45.  She stated that she also needs 30 hydrocodone to hold her over until that appointment.  She only has 3 left as of now.  Cb#[phone]

## 2016-08-02 NOTE — Telephone Encounter (Signed)
Last Visit: 01/27/16 Next Visit: 08/26/16 UDS: 12/21/15 Narc Agreement: 01/28/16  Okay to refill Hydrocodone?

## 2016-08-24 ENCOUNTER — Encounter: Payer: Self-pay | Admitting: Internal Medicine

## 2016-08-24 ENCOUNTER — Ambulatory Visit (INDEPENDENT_AMBULATORY_CARE_PROVIDER_SITE_OTHER): Payer: Medicare Other | Admitting: Internal Medicine

## 2016-08-24 VITALS — BP 124/76 | HR 72 | Temp 97.8°F | Resp 18 | Ht 65.0 in | Wt 162.0 lb

## 2016-08-24 DIAGNOSIS — R112 Nausea with vomiting, unspecified: Secondary | ICD-10-CM

## 2016-08-24 DIAGNOSIS — R350 Frequency of micturition: Secondary | ICD-10-CM

## 2016-08-24 LAB — BASIC METABOLIC PANEL WITH GFR
BUN: 17 mg/dL (ref 7–25)
CO2: 34 mmol/L — ABNORMAL HIGH (ref 20–31)
Calcium: 9.7 mg/dL (ref 8.6–10.4)
Chloride: 98 mmol/L (ref 98–110)
Creat: 0.77 mg/dL (ref 0.60–0.93)
GFR, Est African American: 86 mL/min (ref >=60)
GFR, Est Non African American: 75 mL/min (ref >=60)
Glucose, Bld: 96 mg/dL (ref 65–99)
Potassium: 4.6 mmol/L (ref 3.5–5.3)
Sodium: 139 mmol/L (ref 135–146)

## 2016-08-24 LAB — CBC WITH DIFFERENTIAL/PLATELET
Basophils Absolute: 82 cells/uL (ref 0–200)
Basophils Relative: 1 %
Eosinophils Absolute: 246 cells/uL (ref 15–500)
Eosinophils Relative: 3 %
HCT: 40.1 % (ref 35.0–45.0)
Hemoglobin: 13.1 g/dL (ref 11.7–15.5)
Lymphocytes Relative: 21 %
Lymphs Abs: 1722 cells/uL (ref 850–3900)
MCH: 30.9 pg (ref 27.0–33.0)
MCHC: 32.7 g/dL (ref 32.0–36.0)
MCV: 94.6 fL (ref 80.0–100.0)
MPV: 9.3 fL (ref 7.5–12.5)
Monocytes Absolute: 738 cells/uL (ref 200–950)
Monocytes Relative: 9 %
Neutro Abs: 5412 cells/uL (ref 1500–7800)
Neutrophils Relative %: 66 %
Platelets: 306 10*3/uL (ref 140–400)
RBC: 4.24 MIL/uL (ref 3.80–5.10)
RDW: 13.6 % (ref 11.0–15.0)
WBC: 8.2 10*3/uL (ref 3.8–10.8)

## 2016-08-24 LAB — HEPATIC FUNCTION PANEL
ALT: 16 U/L (ref 6–29)
AST: 21 U/L (ref 10–35)
Albumin: 4 g/dL (ref 3.6–5.1)
Alkaline Phosphatase: 64 U/L (ref 33–130)
Bilirubin, Direct: 0.3 mg/dL — ABNORMAL HIGH (ref <=0.2)
Indirect Bilirubin: 1.1 mg/dL (ref 0.2–1.2)
Total Bilirubin: 1.4 mg/dL — ABNORMAL HIGH (ref 0.2–1.2)
Total Protein: 6.9 g/dL (ref 6.1–8.1)

## 2016-08-24 MED ORDER — ONDANSETRON HCL 4 MG PO TABS: 4.0000 mg | ORAL_TABLET | Freq: Three times a day (TID) | ORAL | 1 refills | Status: AC | PRN

## 2016-08-24 MED ORDER — PROMETHAZINE-DM 6.25-15 MG/5ML PO SYRP: ORAL_SOLUTION | ORAL | 1 refills | Status: DC

## 2016-08-24 MED ORDER — RANITIDINE HCL 300 MG PO TABS: 300.0000 mg | ORAL_TABLET | Freq: Every day | ORAL | 1 refills | Status: DC

## 2016-08-24 MED ORDER — LEVOFLOXACIN 750 MG PO TABS: 750.0000 mg | ORAL_TABLET | Freq: Every day | ORAL | 0 refills | Status: DC

## 2016-08-24 MED ORDER — FLUTICASONE PROPIONATE 50 MCG/ACT NA SUSP: 2.0000 | Freq: Every day | NASAL | 0 refills | Status: DC

## 2016-08-24 NOTE — Patient Instructions (Addendum)
Please take levaquin once daily for 7 days with food.  Please take phenergan dm up to 3 times daily to help with cough and congestion.  Please use 2 sprays of flonase per nostril right before bedtime.  Please use zofran every 6-8 hours as needed for nausea.  Don't wait until you are about to vomit.  Please take zantac twice daily to help with the queasy feeling in your stomach.   Please slowly advance your diet to bland foods like saltine crackers, bread, rice, plain toast.  Push fluids as much as possible.  Drink gatorade or pedialyte to help with your hydration level.

## 2016-08-24 NOTE — Progress Notes (Signed)
HPI  Patient presents to the office for evaluation of cough and nausea.  She reports that it started as sore throat.  She is having some nausea and queasiness. She reports that she was able to get some mucous up.  She did vomit for the last 2-3 days.  She reports that she was so sicked she could not drive to get here.  She reports that she couldn't call a cab.  She reports that she has also been having some diarrhea.  She reports that she is generally having 1 -2 stools.  She reports that she has been urinating a lot as well.  She reports that she had to wear pullups at home.  She was not having pain when urinating.  She reports that she feels like her bladder is out of place.  She has been having a dry cough.  She brought some stuff up in the shower that was clear mucous.  She has no nasal congestion.  She has some mild ear pressure.  She reports that she is fatigued and tired.  She has not had a fever at all but she did have hot and cold chills.     Review of Systems  Constitutional: Positive for chills and malaise/fatigue. Negative for diaphoresis, fever and weight loss.  HENT: Positive for congestion and sore throat. Negative for ear pain.   Respiratory: Positive for cough and sputum production. Negative for shortness of breath and wheezing.   Cardiovascular: Negative for chest pain, palpitations and leg swelling.  Gastrointestinal: Positive for abdominal pain, nausea and vomiting. Negative for blood in stool, constipation, diarrhea, heartburn and melena.  Genitourinary: Positive for frequency and urgency. Negative for dysuria, flank pain and hematuria.  Skin: Negative.   Neurological: Positive for dizziness and weakness.    PE:  Vitals:   08/24/16 1431  BP: 124/76  Pulse: 72  Resp: 18  Temp: 97.8 F (36.6 C)    General:  Alert and non-toxic, WDWN, NAD HEENT: NCAT, PERLA, EOM normal, no occular discharge or erythema.  Nasal mucosal edema with mild sinus tenderness to palpation.   Oropharynx clear with minimal oropharyngeal edema and erythema.  Mucous membranes mildly dry and pink. Neck:  No Cervical adenopathy Chest:  RRR no MRGs.  Lungs clear to auscultation A&P with no wheezes or rales.  Mild rhonchi which clear on coughing.     Abdomen: +BS x 4 quadrants, soft, mild suprapubic tenderness to palpation, no guarding, rigidity, or rebound.  No CVA tenderness bilaterally Skin: warm and dry no rash Neuro: A&Ox4, CN II-XII grossly intact, mildly unsteady gait.    Assessment and Plan:  Patient non-toxic appearing here in the office.  She is mildly dehydrated on examination.  VS stable.  She has no CVA tenderness to palpation.  There is mild suprapubic tenderness to palpation.  She does have some mild rhonchi which clear with coughing.  Ddx is broad as there seem to be several different systems involved in this illness.  May be viral syndrome vs. UTI vs. Bronchitis vs. PNA vs. Gastroenteritis.  Will start patient on levaquin for coverage of both bacterial lung vs. Bladder infection.  Will also give symptomatic treatment for nausea including zofran.  Patient also recommended to take ranitidine 300 mg BID for gastritis secondary to nausea and vomiting.  Will symptomatically treat URI symptoms with phenergan dm, nasacort, daily antihsitamine, and nasal saline.  Patient to return to office if worsening of symptoms.  Patient to go to the ER if dizziness,  BP readings of lower than 100/60, severe SOB, wheezing, or CP.  She reports understanding.  .    1. Non-intractable vomiting with nausea, unspecified vomiting type  - CBC with Differential/Platelet - BASIC METABOLIC PANEL WITH GFR - Hepatic function panel  2. Urinary frequency  - Urinalysis, Routine w reflex microscopic - Urine culture

## 2016-08-25 ENCOUNTER — Other Ambulatory Visit: Payer: Self-pay | Admitting: Internal Medicine

## 2016-08-25 DIAGNOSIS — Z8669 Personal history of other diseases of the nervous system and sense organs: Secondary | ICD-10-CM

## 2016-08-25 HISTORY — DX: Personal history of other diseases of the nervous system and sense organs: Z86.69

## 2016-08-25 LAB — URINALYSIS, MICROSCOPIC ONLY
Bacteria, UA: NONE SEEN [HPF]
Casts: NONE SEEN [LPF]
Crystals: NONE SEEN [HPF]
RBC / HPF: NONE SEEN RBC/HPF (ref <=2)
Yeast: NONE SEEN [HPF]

## 2016-08-25 LAB — URINALYSIS, ROUTINE W REFLEX MICROSCOPIC
Bilirubin Urine: NEGATIVE
Glucose, UA: NEGATIVE
Hgb urine dipstick: NEGATIVE
Ketones, ur: NEGATIVE
Nitrite: NEGATIVE
Protein, ur: NEGATIVE
Specific Gravity, Urine: 1.011 (ref 1.001–1.035)
pH: 7 (ref 5.0–8.0)

## 2016-08-25 MED ORDER — DOXYCYCLINE HYCLATE 100 MG PO CAPS: 100.0000 mg | ORAL_CAPSULE | Freq: Two times a day (BID) | ORAL | 0 refills | Status: DC

## 2016-08-25 NOTE — Progress Notes (Signed)
Office Visit Note  Patient: Debra Short             Date of Birth: 1940/11/08           MRN: [id number]             PCP: [name], MD Referring: Unk [name], MD Visit Date: 08/26/2016 Occupation: '@[email or web address]' @    Subjective:  Follow-up   History of Present Illness: Debra Short is a 76 y.o. female  Patient had a "rough winter". It was associated with joint pain especially to the hands and feet. Patient is getting inadequate sleep also and would like to have that addressed if possible. Otherwise patient has similar complaints as in the previous visit that includes fibromyalgia flares.  She has a history of severe osteoarthritis of the hands She has a history of chronic knee joint pain secondary to osteoarthritis.  At the last visit of 01/27/2016, Dr. Estanislado Pandy gave her cortisone injection in the right trapezius muscle.     Activities of Daily Living:  Patient reports morning stiffness for 30 minutes.   Patient Reports nocturnal pain.  Difficulty dressing/grooming: Reports Difficulty climbing stairs: Reports Difficulty getting out of chair: Reports Difficulty using hands for taps, buttons, cutlery, and/or writing: Reports   No Rheumatology ROS completed.   PMFS History:  Patient Active Problem List   Diagnosis Date Noted  . Trapezius muscle spasm 08/25/2016  . History of migraine 08/25/2016  . History of depression 08/25/2016  . History of high cholesterol 08/25/2016  . Primary osteoarthritis of both hands 07/20/2016  . Degenerative disc disease, lumbar 07/20/2016  . Primary osteoarthritis of both knees 07/20/2016  . Fibromyalgia syndrome 07/20/2016  . 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 BMI 27.0-27.9,adult 05/12/2015  . Depression with anxiety 10/06/2014  . Insomnia 10/06/2014  . Medication management 10/09/2013  . Hyperlipidemia   . Hypertension   . GERD (gastroesophageal reflux disease)   . Vitamin D deficiency   . IBS  (irritable bowel syndrome)   . Diabetes mellitus type 2, controlled ([place])   . Non Hodgkin's lymphoma ([place])     Past Medical History:  Diagnosis Date  . Fibromyalgia   . GERD (gastroesophageal reflux disease)   . Hyperlipidemia   . Hypertension   . IBS (irritable bowel syndrome)   . 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 Vitamin D deficiency     Family History  Problem Relation Age of Onset  . Cancer Mother   . Cancer Father   . Cancer Brother   . Cancer Maternal Grandfather    Past Surgical History:  Procedure Laterality Date  . ABDOMINAL HYSTERECTOMY    . ORIF ANKLE FRACTURE Left 09/26/2014  . ORIF ANKLE FRACTURE Left 09/26/2014   Procedure: OPEN REDUCTION INTERNAL FIXATION (ORIF) ANKLE FRACTURE;  Surgeon: [name], MD;  Location: [place];  Service: Orthopedics;  Laterality: Left;  . TONSILLECTOMY     Social History   Social History Narrative  . No narrative on file     Objective: Vital Signs: BP 122/64   Pulse 78   Resp 14   Ht '5\' 5"'  (1.651 m)   Wt 162 lb (73.5 kg)   BMI 26.96 kg/m    Physical Exam   Musculoskeletal Exam:  Full range of motion of all joints  grip strength is equal and strong bilaterally Fiber myalgia tender points are 18 out of 18 positive with especially bilateral trapezius muscle pain  CDAI Exam: No  CDAI exam completed.  No synovitis on examination  Investigation: Findings:  July 2017:  Urine drug screen was positive for benzodiazepines and opiates.  Office Visit on 08/24/2016  Component Date Value Ref Range Status  . Color, Urine 08/24/2016 YELLOW  YELLOW Final  . APPearance 08/24/2016 CLEAR  CLEAR Final  . Specific Gravity, Urine 08/24/2016 1.011  1.001 - 1.035 Final  . pH 08/24/2016 7.0  5.0 - 8.0 Final  . Glucose, UA 08/24/2016 NEGATIVE  NEGATIVE Final  . Bilirubin Urine 08/24/2016 NEGATIVE  NEGATIVE Final  . Ketones, ur 08/24/2016 NEGATIVE  NEGATIVE Final  . Hgb urine dipstick 08/24/2016 NEGATIVE  NEGATIVE Final  . Protein, ur  08/24/2016 NEGATIVE  NEGATIVE Final  . Nitrite 08/24/2016 NEGATIVE  NEGATIVE Final  . Leukocytes, UA 08/24/2016 2+* NEGATIVE Final  . Colony Count 08/24/2016 10,000-50,000 CFU/mL   Preliminary  . Preliminary Report 08/24/2016 ESCHERICHIA COLI   Preliminary  . WBC 08/24/2016 8.2  3.8 - 10.8 K/uL Final  . RBC 08/24/2016 4.24  3.80 - 5.10 MIL/uL Final  . Hemoglobin 08/24/2016 13.1  11.7 - 15.5 g/dL Final  . HCT 08/24/2016 40.1  35.0 - 45.0 % Final  . MCV 08/24/2016 94.6  80.0 - 100.0 fL Final  . MCH 08/24/2016 30.9  27.0 - 33.0 pg Final  . MCHC 08/24/2016 32.7  32.0 - 36.0 g/dL Final  . RDW 08/24/2016 13.6  11.0 - 15.0 % Final  . Platelets 08/24/2016 306  140 - 400 K/uL Final  . MPV 08/24/2016 9.3  7.5 - 12.5 fL Final  . Neutro Abs 08/24/2016 5412  1,500 - 7,800 cells/uL Final  . Lymphs Abs 08/24/2016 1722  850 - 3,900 cells/uL Final  . Monocytes Absolute 08/24/2016 738  200 - 950 cells/uL Final  . Eosinophils Absolute 08/24/2016 246  15 - 500 cells/uL Final  . Basophils Absolute 08/24/2016 82  0 - 200 cells/uL Final  . Neutrophils Relative % 08/24/2016 66  % Final  . Lymphocytes Relative 08/24/2016 21  % Final  . Monocytes Relative 08/24/2016 9  % Final  . Eosinophils Relative 08/24/2016 3  % Final  . Basophils Relative 08/24/2016 1  % Final  . Smear Review 08/24/2016 Criteria for review not met   Final  . Sodium 08/24/2016 139  135 - 146 mmol/L Final  . Potassium 08/24/2016 4.6  3.5 - 5.3 mmol/L Final  . Chloride 08/24/2016 98  98 - 110 mmol/L Final  . CO2 08/24/2016 34* 20 - 31 mmol/L Final  . Glucose, Bld 08/24/2016 96  65 - 99 mg/dL Final  . BUN 08/24/2016 17  7 - 25 mg/dL Final  . Creat 08/24/2016 0.77  0.60 - 0.93 mg/dL Final   Comment:   For patients > or = 78 years of age: The upper reference limit for Creatinine is approximately 13% higher for people identified as African-American.     . Calcium 08/24/2016 9.7  8.6 - 10.4 mg/dL Final  . GFR, Est African American  08/24/2016 86  >=60 mL/min Final  . GFR, Est Non African American 08/24/2016 75  >=60 mL/min Final   Comment:   The estimated GFR is a calculation valid for adults (>=27 years old) that uses the CKD-EPI algorithm to adjust for age and sex. It is   not to be used for children, pregnant women, hospitalized patients,    patients on dialysis, or with rapidly changing kidney function. According to the NKDEP, eGFR >89 is normal, 60-89 shows mild impairment, 30-59  shows moderate impairment, 15-29 shows severe impairment and <15 is ESRD.     Marland Kitchen Total Bilirubin 08/24/2016 1.4* 0.2 - 1.2 mg/dL Final  . Bilirubin, Direct 08/24/2016 0.3* <=0.2 mg/dL Final  . Indirect Bilirubin 08/24/2016 1.1  0.2 - 1.2 mg/dL Final  . Alkaline Phosphatase 08/24/2016 64  33 - 130 U/L Final  . AST 08/24/2016 21  10 - 35 U/L Final  . ALT 08/24/2016 16  6 - 29 U/L Final  . Total Protein 08/24/2016 6.9  6.1 - 8.1 g/dL Final  . Albumin 08/24/2016 4.0  3.6 - 5.1 g/dL Final  . WBC, UA 08/24/2016 6-10* <=5 WBC/HPF Final  . RBC / HPF 08/24/2016 NONE SEEN  <=2 RBC/HPF Final  . Squamous Epithelial / LPF 08/24/2016 0-5  <=5 HPF Final  . Bacteria, UA 08/24/2016 NONE SEEN  NONE SEEN HPF Final  . Crystals 08/24/2016 NONE SEEN  NONE SEEN HPF Final  . Casts 08/24/2016 NONE SEEN  NONE SEEN LPF Final  . Yeast 08/24/2016 NONE SEEN  NONE SEEN HPF Final  Office Visit on 07/07/2016  Component Date Value Ref Range Status  . Creatinine, Urine 07/07/2016 162  20 - 320 mg/dL Final  . Microalb, Ur 07/07/2016 0.7  Not estab mg/dL Final  . Microalb Creat Ratio 07/07/2016 4  <30 mcg/mg creat Final   Comment: The ADA has defined abnormalities in albumin excretion as follows:           Category           Result                            (mcg/mg creatinine)                 Normal:    <30       Microalbuminuria:    30 - 299   Clinical albuminuria:    > or = 300   The ADA recommends that at least two of three specimens collected within a 3 -  6 month period be abnormal before considering a patient to be within a diagnostic category.     . Color, Urine 07/07/2016 YELLOW  YELLOW Final  . APPearance 07/07/2016 CLEAR  CLEAR Final  . Specific Gravity, Urine 07/07/2016 1.017  1.001 - 1.035 Final  . pH 07/07/2016 6.0  5.0 - 8.0 Final  . Glucose, UA 07/07/2016 NEGATIVE  NEGATIVE Final  . Bilirubin Urine 07/07/2016 NEGATIVE  NEGATIVE Final  . Ketones, ur 07/07/2016 NEGATIVE  NEGATIVE Final  . Hgb urine dipstick 07/07/2016 NEGATIVE  NEGATIVE Final  . Protein, ur 07/07/2016 NEGATIVE  NEGATIVE Final  . Nitrite 07/07/2016 NEGATIVE  NEGATIVE Final  . Leukocytes, UA 07/07/2016 2+* NEGATIVE Final  . Sodium 07/07/2016 142  135 - 146 mmol/L Final  . Potassium 07/07/2016 5.0  3.5 - 5.3 mmol/L Final  . Chloride 07/07/2016 103  98 - 110 mmol/L Final  . CO2 07/07/2016 29  20 - 31 mmol/L Final  . Glucose, Bld 07/07/2016 99  65 - 99 mg/dL Final  . BUN 07/07/2016 15  7 - 25 mg/dL Final  . Creat 07/07/2016 0.78  0.60 - 0.93 mg/dL Final   Comment:   For patients > or = 78 years of age: The upper reference limit for Creatinine is approximately 13% higher for people identified as African-American.     . Calcium 07/07/2016 9.5  8.6 - 10.4 mg/dL Final  . GFR, Est African  American 07/07/2016 85  >=60 mL/min Final  . GFR, Est Non African American 07/07/2016 74  >=60 mL/min Final   Comment:   The estimated GFR is a calculation valid for adults (>=54 years old) that uses the CKD-EPI algorithm to adjust for age and sex. It is   not to be used for children, pregnant women, hospitalized patients,    patients on dialysis, or with rapidly changing kidney function. According to the NKDEP, eGFR >89 is normal, 60-89 shows mild impairment, 30-59 shows moderate impairment, 15-29 shows severe impairment and <15 is ESRD.     . WBC 07/07/2016 5.8  3.8 - 10.8 K/uL Final  . RBC 07/07/2016 4.13  3.80 - 5.10 MIL/uL Final  . Hemoglobin 07/07/2016 12.9  11.7 - 15.5  g/dL Final  . HCT 07/07/2016 39.7  35.0 - 45.0 % Final  . MCV 07/07/2016 96.1  80.0 - 100.0 fL Final  . MCH 07/07/2016 31.2  27.0 - 33.0 pg Final  . MCHC 07/07/2016 32.5  32.0 - 36.0 g/dL Final  . RDW 07/07/2016 13.4  11.0 - 15.0 % Final  . Platelets 07/07/2016 300  140 - 400 K/uL Final  . MPV 07/07/2016 9.6  7.5 - 12.5 fL Final  . Neutro Abs 07/07/2016 3248  1,500 - 7,800 cells/uL Final  . Lymphs Abs 07/07/2016 1624  850 - 3,900 cells/uL Final  . Monocytes Absolute 07/07/2016 696  200 - 950 cells/uL Final  . Eosinophils Absolute 07/07/2016 174  15 - 500 cells/uL Final  . Basophils Absolute 07/07/2016 58  0 - 200 cells/uL Final  . Neutrophils Relative % 07/07/2016 56  % Final  . Lymphocytes Relative 07/07/2016 28  % Final  . Monocytes Relative 07/07/2016 12  % Final  . Eosinophils Relative 07/07/2016 3  % Final  . Basophils Relative 07/07/2016 1  % Final  . Smear Review 07/07/2016 Criteria for review not met   Final  . Total Bilirubin 07/07/2016 0.6  0.2 - 1.2 mg/dL Final  . Bilirubin, Direct 07/07/2016 0.1  <=0.2 mg/dL Final  . Indirect Bilirubin 07/07/2016 0.5  0.2 - 1.2 mg/dL Final  . Alkaline Phosphatase 07/07/2016 71  33 - 130 U/L Final  . AST 07/07/2016 16  10 - 35 U/L Final  . ALT 07/07/2016 15  6 - 29 U/L Final  . Total Protein 07/07/2016 6.7  6.1 - 8.1 g/dL Final  . Albumin 07/07/2016 3.9  3.6 - 5.1 g/dL Final  . Magnesium 07/07/2016 2.1  1.5 - 2.5 mg/dL Final  . Cholesterol 07/07/2016 120  <200 mg/dL Final  . Triglycerides 07/07/2016 172* <150 mg/dL Final  . HDL 07/07/2016 37* >50 mg/dL Final  . Total CHOL/HDL Ratio 07/07/2016 3.2  <5.0 Ratio Final  . VLDL 07/07/2016 34* <30 mg/dL Final  . LDL Cholesterol 07/07/2016 49  <100 mg/dL Final  . TSH 07/07/2016 2.75  mIU/L Final   Comment:   Reference Range   > or = 20 Years  0.40-4.50   Pregnancy Range First trimester  0.26-2.66 Second trimester 0.55-2.73 Third trimester  0.43-2.91     . Hgb A1c MFr Bld 07/07/2016  5.5  <5.7 % Final   Comment:   For the purpose of screening for the presence of diabetes:   <5.7%       Consistent with the absence of diabetes 5.7-6.4 %   Consistent with increased risk for diabetes (prediabetes) >=6.5 %     Consistent with diabetes   This assay result is consistent with  a decreased risk of diabetes.   Currently, no consensus exists regarding use of hemoglobin A1c for diagnosis of diabetes in children.   According to American Diabetes Association (ADA) guidelines, hemoglobin A1c <7.0% represents optimal control in non-pregnant diabetic patients. Different metrics may apply to specific patient populations. Standards of Medical Care in Diabetes (ADA).     . Mean Plasma Glucose 07/07/2016 111  mg/dL Final  . Insulin 07/07/2016 20.5* 2.0 - 19.6 uIU/mL Final   Comment: This insulin assay shows strong cross-reactivity for some insulin analogs (lispro, aspart, and glargine) and much lower cross-reactivity with others (detemir, glulisine).   . Vit D, 25-Hydroxy 07/07/2016 74  30 - 100 ng/mL Final   Comment: Vitamin D Status           25-OH Vitamin D        Deficiency                <20 ng/mL        Insufficiency         20 - 29 ng/mL        Optimal             > or = 30 ng/mL   For 25-OH Vitamin D testing on patients on D2-supplementation and patients for whom quantitation of D2 and D3 fractions is required, the QuestAssureD 25-OH VIT D, (D2,D3), LC/MS/MS is recommended: order code [phone] (patients > 2 yrs).   . WBC, UA 07/07/2016 6-10* <=5 WBC/HPF Final  . RBC / HPF 07/07/2016 NONE SEEN  <=2 RBC/HPF Final  . Squamous Epithelial / LPF 07/07/2016 10-20* <=5 HPF Final  . Bacteria, UA 07/07/2016 FEW* NONE SEEN HPF Final  . Crystals 07/07/2016 NONE SEEN  NONE SEEN HPF Final  . Casts 07/07/2016 NONE SEEN  NONE SEEN LPF Final  . Yeast 07/07/2016 NONE SEEN  NONE SEEN HPF Final  Office Visit on 04/14/2016  Component Date Value Ref Range Status  . WBC 04/14/2016 7.3  3.8  - 10.8 K/uL Final  . RBC 04/14/2016 4.15  3.80 - 5.10 MIL/uL Final  . Hemoglobin 04/14/2016 12.8  11.7 - 15.5 g/dL Final  . HCT 04/14/2016 39.8  35.0 - 45.0 % Final  . MCV 04/14/2016 95.9  80.0 - 100.0 fL Final  . MCH 04/14/2016 30.8  27.0 - 33.0 pg Final  . MCHC 04/14/2016 32.2  32.0 - 36.0 g/dL Final  . RDW 04/14/2016 14.2  11.0 - 15.0 % Final  . Platelets 04/14/2016 280  140 - 400 K/uL Final  . MPV 04/14/2016 9.1  7.5 - 12.5 fL Final  . Neutro Abs 04/14/2016 4088  1,500 - 7,800 cells/uL Final  . Lymphs Abs 04/14/2016 1971  850 - 3,900 cells/uL Final  . Monocytes Absolute 04/14/2016 1022* 200 - 950 cells/uL Final  . Eosinophils Absolute 04/14/2016 146  15 - 500 cells/uL Final  . Basophils Absolute 04/14/2016 73  0 - 200 cells/uL Final  . Neutrophils Relative % 04/14/2016 56  % Final  . Lymphocytes Relative 04/14/2016 27  % Final  . Monocytes Relative 04/14/2016 14  % Final  . Eosinophils Relative 04/14/2016 2  % Final  . Basophils Relative 04/14/2016 1  % Final  . Smear Review 04/14/2016 Criteria for review not met   Final  . Sodium 04/14/2016 139  135 - 146 mmol/L Final  . Potassium 04/14/2016 4.7  3.5 - 5.3 mmol/L Final  . Chloride 04/14/2016 98  98 - 110 mmol/L Final  . CO2 04/14/2016  33* 20 - 31 mmol/L Final  . Glucose, Bld 04/14/2016 81  65 - 99 mg/dL Final  . BUN 04/14/2016 13  7 - 25 mg/dL Final  . Creat 04/14/2016 0.85  0.60 - 0.93 mg/dL Final   Comment:   For patients > or = 78 years of age: The upper reference limit for Creatinine is approximately 13% higher for people identified as African-American.     . Calcium 04/14/2016 9.5  8.6 - 10.4 mg/dL Final  . GFR, Est African American 04/14/2016 76  >=60 mL/min Final  . GFR, Est Non African American 04/14/2016 66  >=60 mL/min Final   Comment:   The estimated GFR is a calculation valid for adults (>=71 years old) that uses the CKD-EPI algorithm to adjust for age and sex. It is   not to be used for children, pregnant  women, hospitalized patients,    patients on dialysis, or with rapidly changing kidney function. According to the NKDEP, eGFR >89 is normal, 60-89 shows mild impairment, 30-59 shows moderate impairment, 15-29 shows severe impairment and <15 is ESRD.     Marland Kitchen Total Bilirubin 04/14/2016 0.8  0.2 - 1.2 mg/dL Final  . Bilirubin, Direct 04/14/2016 0.2  <=0.2 mg/dL Final  . Indirect Bilirubin 04/14/2016 0.6  0.2 - 1.2 mg/dL Final  . Alkaline Phosphatase 04/14/2016 64  33 - 130 U/L Final  . AST 04/14/2016 16  10 - 35 U/L Final  . ALT 04/14/2016 16  6 - 29 U/L Final  . Total Protein 04/14/2016 6.9  6.1 - 8.1 g/dL Final  . Albumin 04/14/2016 4.2  3.6 - 5.1 g/dL Final  . Cholesterol 04/14/2016 133  125 - 200 mg/dL Final  . Triglycerides 04/14/2016 131  <150 mg/dL Final  . HDL 04/14/2016 45* >=46 mg/dL Final  . Total CHOL/HDL Ratio 04/14/2016 3.0  <=5.0 Ratio Final  . VLDL 04/14/2016 26  <30 mg/dL Final  . LDL Cholesterol 04/14/2016 62  <130 mg/dL Final   Comment:   Total Cholesterol/HDL Ratio:CHD Risk                        Coronary Heart Disease Risk Table                                        Men       Women          1/2 Average Risk              3.4        3.3              Average Risk              5.0        4.4           2X Average Risk              9.6        7.1           3X Average Risk             23.4       11.0 Use the calculated Patient Ratio above and the CHD Risk table  to determine the patient's CHD Risk.       Imaging: No results found.  Speciality Comments: No specialty comments available.  Procedures:  Trigger Point Inj Date/Time: 08/26/2016 12:56 PM Performed by: Eliezer Lofts Authorized by: Eliezer Lofts   Consent Given by:  Patient Site marked: the procedure site was marked   Timeout: prior to procedure the correct patient, procedure, and site was verified   Indications:  Muscle spasm and pain Total # of Trigger Points:  2 Location: neck   Needle  Size:  27 G Approach:  Dorsal Medications #1:  0.3 mL lidocaine 1 %; 10 mg triamcinolone acetonide 40 MG/ML Medications #2:  0.3 mL lidocaine 1 %; 10 mg triamcinolone acetonide 40 MG/ML Patient tolerance:  Patient tolerated the procedure well with no immediate complications Comments: Bilateral trapezius muscles were injected with 0.3 miles a 1% lidocaine mixed with 10 mg of Kenalog. Patient tolerated procedure well. There are no complications   Allergies: Effexor [venlafaxine]; Nortriptyline; Paxil [paroxetine hcl]; Robaxin [methocarbamol]; Tizanidine; and Zoloft [sertraline hcl]   Assessment / Plan:     Visit Diagnoses: Fibromyalgia syndrome  Degenerative disc disease, lumbar  Primary osteoarthritis of both hands  Primary osteoarthritis of both knees  Trapezius muscle spasm - Right trapezius area  History of migraine  History of depression  History of high cholesterol   Plan: #1: Fibromyalgia syndrome. No significant change from the last visit.  #2: Ongoing fatigue and insomnia.  #3: OA of bilateral hands and bilateral knees.  #4: Bilateral trapezius muscle spasms. Please see procedure note for full details. We will give cortisone injection to bilateral trapezius muscles  #5: Return to clinic in 6 months  #6: Patient requests refill on her hydrocodone. I'm agreeable since it's due in about a week and that will help the patient does not have to drive back to our office. Note that she lives alone  Orders: Orders Placed This Encounter  Procedures  . Trigger Point Injection   Meds ordered this encounter  Medications  . HYDROcodone-acetaminophen (NORCO/VICODIN) 5-325 MG tablet    Sig: Take 1 tablet by mouth 2 (two) times daily as needed for moderate pain.    Dispense:  30 tablet    Refill:  0    Order Specific Question:   Supervising Provider    Answer:   Bo Merino [phone]    Face-to-face time spent with patient was 30 minutes. 50% of time was spent in  counseling and coordination of care.  Follow-Up Instructions: No Follow-up on file.   Eliezer Lofts, PA-C  Note - This record has been created using [employer].  Chart creation errors have been sought, but may not always  have been located. Such creation errors do not reflect on  the standard of medical care.

## 2016-08-26 ENCOUNTER — Encounter: Payer: Self-pay | Admitting: Rheumatology

## 2016-08-26 ENCOUNTER — Ambulatory Visit (INDEPENDENT_AMBULATORY_CARE_PROVIDER_SITE_OTHER): Payer: Medicare Other | Admitting: Rheumatology

## 2016-08-26 VITALS — BP 122/64 | HR 78 | Resp 14 | Ht 65.0 in | Wt 162.0 lb

## 2016-08-26 DIAGNOSIS — M19042 Primary osteoarthritis, left hand: Secondary | ICD-10-CM

## 2016-08-26 DIAGNOSIS — Z8639 Personal history of other endocrine, nutritional and metabolic disease: Secondary | ICD-10-CM

## 2016-08-26 DIAGNOSIS — M797 Fibromyalgia: Secondary | ICD-10-CM

## 2016-08-26 DIAGNOSIS — M51369 Other intervertebral disc degeneration, lumbar region without mention of lumbar back pain or lower extremity pain: Secondary | ICD-10-CM

## 2016-08-26 DIAGNOSIS — M5136 Other intervertebral disc degeneration, lumbar region: Secondary | ICD-10-CM | POA: Diagnosis not present

## 2016-08-26 DIAGNOSIS — Z8659 Personal history of other mental and behavioral disorders: Secondary | ICD-10-CM | POA: Diagnosis not present

## 2016-08-26 DIAGNOSIS — M170 Bilateral primary osteoarthritis of knee: Secondary | ICD-10-CM | POA: Diagnosis not present

## 2016-08-26 DIAGNOSIS — M62838 Other muscle spasm: Secondary | ICD-10-CM

## 2016-08-26 DIAGNOSIS — M19041 Primary osteoarthritis, right hand: Secondary | ICD-10-CM

## 2016-08-26 DIAGNOSIS — Z8669 Personal history of other diseases of the nervous system and sense organs: Secondary | ICD-10-CM

## 2016-08-26 MED ORDER — LIDOCAINE HCL 1 % IJ SOLN
0.3000 mL | INTRAMUSCULAR | Status: AC | PRN
  Administered 2016-08-26: .3 mL

## 2016-08-26 MED ORDER — TRIAMCINOLONE ACETONIDE 40 MG/ML IJ SUSP
10.0000 mg | INTRAMUSCULAR | Status: AC | PRN
Start: 2016-08-26 — End: 2016-08-26
  Administered 2016-08-26: 10 mg via INTRAMUSCULAR

## 2016-08-26 MED ORDER — HYDROCODONE-ACETAMINOPHEN 5-325 MG PO TABS: 1.0000 | ORAL_TABLET | Freq: Two times a day (BID) | ORAL | 0 refills | Status: DC | PRN

## 2016-08-26 MED ORDER — TRIAMCINOLONE ACETONIDE 40 MG/ML IJ SUSP
10.0000 mg | INTRAMUSCULAR | Status: AC | PRN
  Administered 2016-08-26: 10 mg via INTRAMUSCULAR

## 2016-08-27 LAB — URINE CULTURE

## 2016-09-07 ENCOUNTER — Other Ambulatory Visit: Payer: Self-pay | Admitting: Internal Medicine

## 2016-09-13 ENCOUNTER — Other Ambulatory Visit: Payer: Medicare Other

## 2016-09-13 DIAGNOSIS — R350 Frequency of micturition: Secondary | ICD-10-CM

## 2016-09-13 LAB — URINALYSIS, ROUTINE W REFLEX MICROSCOPIC
Bilirubin Urine: NEGATIVE
Glucose, UA: NEGATIVE
Hgb urine dipstick: NEGATIVE
Ketones, ur: NEGATIVE
Nitrite: NEGATIVE
Protein, ur: NEGATIVE
Specific Gravity, Urine: 1.017 (ref 1.001–1.035)
pH: 7 (ref 5.0–8.0)

## 2016-09-14 LAB — URINALYSIS, MICROSCOPIC ONLY
Bacteria, UA: NONE SEEN [HPF]
Casts: NONE SEEN [LPF]
Crystals: NONE SEEN [HPF]
RBC / HPF: NONE SEEN RBC/HPF (ref <=2)
WBC, UA: NONE SEEN WBC/HPF (ref <=5)
Yeast: NONE SEEN [HPF]

## 2016-09-14 LAB — URINE CULTURE: Organism ID, Bacteria: NO GROWTH

## 2016-09-15 ENCOUNTER — Other Ambulatory Visit: Payer: Self-pay | Admitting: Internal Medicine

## 2016-09-15 DIAGNOSIS — F411 Generalized anxiety disorder: Secondary | ICD-10-CM

## 2016-09-15 NOTE — Telephone Encounter (Signed)
Please call Alprazolam 

## 2016-09-16 ENCOUNTER — Other Ambulatory Visit: Payer: Self-pay | Admitting: Internal Medicine

## 2016-09-16 DIAGNOSIS — R350 Frequency of micturition: Secondary | ICD-10-CM

## 2016-09-19 ENCOUNTER — Other Ambulatory Visit: Payer: Self-pay | Admitting: Internal Medicine

## 2016-10-11 ENCOUNTER — Ambulatory Visit: Payer: Self-pay | Admitting: Physician Assistant

## 2016-10-11 ENCOUNTER — Ambulatory Visit: Payer: Self-pay | Admitting: Internal Medicine

## 2016-10-17 ENCOUNTER — Other Ambulatory Visit: Payer: Self-pay | Admitting: *Deleted

## 2016-10-17 MED ORDER — RANITIDINE HCL 300 MG PO TABS: 300.0000 mg | ORAL_TABLET | Freq: Every day | ORAL | 1 refills | Status: DC

## 2016-10-20 ENCOUNTER — Other Ambulatory Visit: Payer: Self-pay | Admitting: Rheumatology

## 2016-10-20 DIAGNOSIS — Z5181 Encounter for therapeutic drug level monitoring: Secondary | ICD-10-CM

## 2016-10-20 MED ORDER — HYDROCODONE-ACETAMINOPHEN 5-325 MG PO TABS: 1.0000 | ORAL_TABLET | Freq: Two times a day (BID) | ORAL | 0 refills | Status: DC | PRN

## 2016-10-20 NOTE — Telephone Encounter (Signed)
Patient request rf on her Hydrocodone. Please call patient when ready to pick up. Patient states can leave a detailed message on answering machine if she does not answer.

## 2016-10-20 NOTE — Telephone Encounter (Signed)
Last Visit: 01/27/16 Next Visit: 08/26/16 UDS: 12/21/15 Narc Agreement: 01/28/16  Patient will come to office to update UDS.   Okay to refill Hydrocodone?

## 2016-10-24 ENCOUNTER — Other Ambulatory Visit: Payer: Self-pay

## 2016-10-24 DIAGNOSIS — Z5181 Encounter for therapeutic drug level monitoring: Secondary | ICD-10-CM | POA: Diagnosis not present

## 2016-10-27 LAB — PAIN MGMT, PROFILE 5 W/CONF, U
Alphahydroxyalprazolam: 318 ng/mL — ABNORMAL HIGH (ref <25)
Alphahydroxymidazolam: NEGATIVE ng/mL (ref <50)
Alphahydroxytriazolam: NEGATIVE ng/mL (ref <50)
Aminoclonazepam: NEGATIVE ng/mL (ref <25)
Amphetamines: NEGATIVE ng/mL (ref <500)
Barbiturates: NEGATIVE ng/mL (ref <300)
Benzodiazepines: POSITIVE ng/mL — AB (ref <100)
Cocaine Metabolite: NEGATIVE ng/mL (ref <150)
Codeine: NEGATIVE ng/mL (ref <50)
Creatinine: 85.3 mg/dL (ref >=20.0)
Hydrocodone: 208 ng/mL — ABNORMAL HIGH (ref <50)
Hydromorphone: NEGATIVE ng/mL (ref <50)
Hydroxyethylflurazepam: NEGATIVE ng/mL (ref <50)
Lorazepam: NEGATIVE ng/mL (ref <50)
Marijuana Metabolite: NEGATIVE ng/mL (ref <20)
Methadone Metabolite: NEGATIVE ng/mL (ref <100)
Morphine: NEGATIVE ng/mL (ref <50)
Nordiazepam: NEGATIVE ng/mL (ref <50)
Norhydrocodone: 642 ng/mL — ABNORMAL HIGH (ref <50)
Opiates: POSITIVE ng/mL — AB (ref <100)
Oxazepam: NEGATIVE ng/mL (ref <50)
Oxidant: NEGATIVE ug/mL (ref <200)
Oxycodone: NEGATIVE ng/mL (ref <100)
Temazepam: NEGATIVE ng/mL (ref <50)
pH: 7.65 (ref 4.5–9.0)

## 2016-11-03 ENCOUNTER — Other Ambulatory Visit: Payer: Self-pay | Admitting: Internal Medicine

## 2016-11-03 ENCOUNTER — Other Ambulatory Visit: Payer: Self-pay | Admitting: Physician Assistant

## 2016-11-30 ENCOUNTER — Other Ambulatory Visit: Payer: Self-pay | Admitting: Rheumatology

## 2016-11-30 ENCOUNTER — Telehealth: Payer: Self-pay | Admitting: *Deleted

## 2016-11-30 MED ORDER — HYDROCODONE-ACETAMINOPHEN 5-325 MG PO TABS: 1.0000 | ORAL_TABLET | Freq: Two times a day (BID) | ORAL | 0 refills | Status: DC | PRN

## 2016-11-30 NOTE — Telephone Encounter (Signed)
Patient needs a refill on her Hydrocodone. Please call when ready to pick up.

## 2016-11-30 NOTE — Telephone Encounter (Signed)
Last Visit: 08/26/16 Next Visit: 02/27/17 UDS: 10/24/16  Narc agreement: 01/18/16   Okay to refill Hydrocodone?  

## 2016-11-30 NOTE — Telephone Encounter (Signed)
ok 

## 2016-11-30 NOTE — Telephone Encounter (Signed)
Patient called and states she was bitten by her dog,who has had all his shots, on Monday.  She was told by her vet she would have to be treated with antibiotics, by law.  T he [facility] department was called and thet had not heard of that law and only treat with antibiotics if the area gets infected.  Dr Melford Aase agrees with this and the patient was told to keep the area clean and call if the area becomes red or infected.

## 2016-12-09 DIAGNOSIS — N3944 Nocturnal enuresis: Secondary | ICD-10-CM | POA: Diagnosis not present

## 2016-12-09 DIAGNOSIS — N3941 Urge incontinence: Secondary | ICD-10-CM | POA: Diagnosis not present

## 2016-12-09 DIAGNOSIS — R350 Frequency of micturition: Secondary | ICD-10-CM | POA: Diagnosis not present

## 2017-01-04 ENCOUNTER — Telehealth: Payer: Self-pay | Admitting: Rheumatology

## 2017-01-04 NOTE — Telephone Encounter (Signed)
Patient requesting an RX for hydrocodone. Please call when ready pick up.

## 2017-01-04 NOTE — Telephone Encounter (Signed)
Last Visit: 08/26/16 Next Visit: 02/27/17 UDS: 10/24/16  Narc agreement: 01/18/16  Last Fill: 11/30/16  Okay to refill Hydrocodone?

## 2017-01-04 NOTE — Telephone Encounter (Signed)
ok 

## 2017-01-05 MED ORDER — HYDROCODONE-ACETAMINOPHEN 5-325 MG PO TABS: 1.0000 | ORAL_TABLET | Freq: Two times a day (BID) | ORAL | 0 refills | Status: DC | PRN

## 2017-01-05 NOTE — Telephone Encounter (Signed)
Left message to advise patient prescription is ready for pick up

## 2017-01-07 ENCOUNTER — Other Ambulatory Visit: Payer: Self-pay | Admitting: Internal Medicine

## 2017-01-07 DIAGNOSIS — I10 Essential (primary) hypertension: Secondary | ICD-10-CM

## 2017-01-11 ENCOUNTER — Other Ambulatory Visit: Payer: Self-pay | Admitting: *Deleted

## 2017-01-11 DIAGNOSIS — G894 Chronic pain syndrome: Secondary | ICD-10-CM

## 2017-01-11 MED ORDER — GABAPENTIN 300 MG PO CAPS: 600.0000 mg | ORAL_CAPSULE | Freq: Two times a day (BID) | ORAL | 0 refills | Status: DC

## 2017-01-12 ENCOUNTER — Ambulatory Visit (INDEPENDENT_AMBULATORY_CARE_PROVIDER_SITE_OTHER): Payer: Medicare Other | Admitting: Internal Medicine

## 2017-01-12 ENCOUNTER — Encounter: Payer: Self-pay | Admitting: Internal Medicine

## 2017-01-12 VITALS — BP 106/76 | HR 68 | Temp 97.5°F | Resp 16 | Ht 65.0 in | Wt 160.2 lb

## 2017-01-12 DIAGNOSIS — N182 Chronic kidney disease, stage 2 (mild): Secondary | ICD-10-CM

## 2017-01-12 DIAGNOSIS — E782 Mixed hyperlipidemia: Secondary | ICD-10-CM | POA: Diagnosis not present

## 2017-01-12 DIAGNOSIS — I10 Essential (primary) hypertension: Secondary | ICD-10-CM

## 2017-01-12 DIAGNOSIS — E1122 Type 2 diabetes mellitus with diabetic chronic kidney disease: Secondary | ICD-10-CM

## 2017-01-12 DIAGNOSIS — E559 Vitamin D deficiency, unspecified: Secondary | ICD-10-CM

## 2017-01-12 DIAGNOSIS — Z79899 Other long term (current) drug therapy: Secondary | ICD-10-CM | POA: Diagnosis not present

## 2017-01-12 LAB — CBC WITH DIFFERENTIAL/PLATELET
Basophils Absolute: 73 cells/uL (ref 0–200)
Basophils Relative: 1 %
Eosinophils Absolute: 365 cells/uL (ref 15–500)
Eosinophils Relative: 5 %
HCT: 41.3 % (ref 35.0–45.0)
Hemoglobin: 13.5 g/dL (ref 11.7–15.5)
Lymphocytes Relative: 30 %
Lymphs Abs: 2190 cells/uL (ref 850–3900)
MCH: 31 pg (ref 27.0–33.0)
MCHC: 32.7 g/dL (ref 32.0–36.0)
MCV: 94.7 fL (ref 80.0–100.0)
MPV: 9.2 fL (ref 7.5–12.5)
Monocytes Absolute: 730 cells/uL (ref 200–950)
Monocytes Relative: 10 %
Neutro Abs: 3942 cells/uL (ref 1500–7800)
Neutrophils Relative %: 54 %
Platelets: 310 10*3/uL (ref 140–400)
RBC: 4.36 MIL/uL (ref 3.80–5.10)
RDW: 13.7 % (ref 11.0–15.0)
WBC: 7.3 10*3/uL (ref 3.8–10.8)

## 2017-01-12 LAB — HEPATIC FUNCTION PANEL
ALT: 13 U/L (ref 6–29)
AST: 13 U/L (ref 10–35)
Albumin: 4 g/dL (ref 3.6–5.1)
Alkaline Phosphatase: 65 U/L (ref 33–130)
Bilirubin, Direct: 0.1 mg/dL (ref <=0.2)
Indirect Bilirubin: 0.6 mg/dL (ref 0.2–1.2)
Total Bilirubin: 0.7 mg/dL (ref 0.2–1.2)
Total Protein: 6.7 g/dL (ref 6.1–8.1)

## 2017-01-12 LAB — BASIC METABOLIC PANEL WITH GFR
BUN: 15 mg/dL (ref 7–25)
CO2: 29 mmol/L (ref 20–31)
Calcium: 9.8 mg/dL (ref 8.6–10.4)
Chloride: 99 mmol/L (ref 98–110)
Creat: 0.93 mg/dL (ref 0.60–0.93)
GFR, Est African American: 68 mL/min (ref >=60)
GFR, Est Non African American: 59 mL/min — ABNORMAL LOW (ref >=60)
Glucose, Bld: 105 mg/dL — ABNORMAL HIGH (ref 65–99)
Potassium: 4.6 mmol/L (ref 3.5–5.3)
Sodium: 140 mmol/L (ref 135–146)

## 2017-01-12 LAB — LIPID PANEL
Cholesterol: 144 mg/dL (ref <200)
HDL: 42 mg/dL — ABNORMAL LOW (ref >50)
LDL Cholesterol: 55 mg/dL (ref <100)
Total CHOL/HDL Ratio: 3.4 Ratio (ref <5.0)
Triglycerides: 235 mg/dL — ABNORMAL HIGH (ref <150)
VLDL: 47 mg/dL — ABNORMAL HIGH (ref <30)

## 2017-01-12 LAB — TSH: TSH: 3.3 mIU/L

## 2017-01-12 NOTE — Progress Notes (Signed)
This very nice 76 y.o. WWF presents for 6 month follow up with Hypertension, Hyperlipidemia, Pre-Diabetes and Vitamin D Deficiency.      Patient is treated for HTN & BP has been controlled at home. Today's BP is at goal -106/76. Patient has had no complaints of any cardiac type chest pain, palpitations, dyspnea/orthopnea/PND, dizziness, claudication, or dependent edema.     Hyperlipidemia is controlled with diet & meds. Patient denies myalgias or other med SE's. Last Lipids were at goal albeit sl elevated Trig's: Lab Results  Component Value Date   CHOL 120 07/07/2016   HDL 37 (L) 07/07/2016   LDLCALC 49 07/07/2016   TRIG 172 (H) 07/07/2016   CHOLHDL 3.2 07/07/2016      Also, the patient has history of T2_NIDDM  (2016) with CKD 2 and has had no symptoms of reactive hypoglycemia, diabetic polys, paresthesias or visual blurring.  Last A1c was at goal: Lab Results  Component Value Date   HGBA1C 5.5 07/07/2016      Further, the patient also has history of Vitamin D Deficiency ("25" in 2009) and supplements vitamin D without any suspected side-effects. Last vitamin D was at goal:  Lab Results  Component Value Date   VD25OH 74 07/07/2016   Current Outpatient Prescriptions on File Prior to Visit  Medication Sig  . acetaminophen (TYLENOL) 650 MG CR tablet Take 650 mg by mouth every 8 (eight) hours as needed for pain.  Marland Kitchen ALPRAZolam (XANAX) 1 MG tablet TAKE 1/2 TO 1 TABLET BY MOUTH TWO TO THREE TIMES DAILY AS NEEDED FOR ANXIETY  . aspirin EC 81 MG tablet Take 81 mg by mouth daily.  Marland Kitchen atorvastatin (LIPITOR) 80 MG tablet TAKE 1/3 TABLET BY MOUTH DAILY FOR CHOLESTEROL. DISCONTINUE PRAVASTATIN  . bisoprolol (ZEBETA) 5 MG tablet TAKE 1 TABLET EVERY MORNING FOR BLOOD PRESSURE  . Cholecalciferol (VITAMIN D3) 5000 units TABS Take 10,000 Units by mouth daily.  . Esomeprazole Magnesium (NEXIUM PO) Take 22.3 mg by mouth See admin instructions. Take 1 capsule (22.3 mg OTC) by mouth every morning  before breakfast, may also take 1 capsule in the evening as needed for acid reflux  . FLUoxetine (PROZAC) 40 MG capsule TAKE 1 CAPSULE (40 MG TOTAL) BY MOUTH DAILY.  . fluticasone (FLONASE) 50 MCG/ACT nasal spray PLACE 2 SPRAYS INTO BOTH NOSTRILS DAILY.  Marland Kitchen gabapentin (NEURONTIN) 300 MG capsule Take 2 capsules (600 mg total) by mouth 2 (two) times daily.  Marland Kitchen HYDROcodone-acetaminophen (NORCO/VICODIN) 5-325 MG tablet Take 1 tablet by mouth 2 (two) times daily as needed for moderate pain.  . metFORMIN (GLUCOPHAGE-XR) 500 MG 24 hr tablet TAKE 1 TABLET EVERY DAY WITH BREAKFAST  . montelukast (SINGULAIR) 10 MG tablet TAKE 1 TABLET EVERY DAY AS NEEDED FOR ALLERGIES  . Multiple Vitamin (MULTIVITAMIN WITH MINERALS) TABS tablet Take 1 tablet by mouth daily.  . ondansetron (ZOFRAN) 4 MG tablet Take 1 tablet (4 mg total) by mouth every 8 (eight) hours as needed for nausea or vomiting.  . triamcinolone (NASACORT) 55 MCG/ACT AERO nasal inhaler Place 2 sprays into both nostrils daily as needed (for seasonal allergies).    No current facility-administered medications on file prior to visit.    Allergies  Allergen Reactions  . Effexor [Venlafaxine] Other (See Comments)    Not effective  . Nortriptyline Other (See Comments)    Not effective  . Paxil [Paroxetine Hcl] Other (See Comments)    Not effective  . Robaxin [Methocarbamol] Itching  . Tizanidine  Itching  . Zoloft [Sertraline Hcl] Other (See Comments)    Not effective   PMHx:   Past Medical History:  Diagnosis Date  . Fibromyalgia   . GERD (gastroesophageal reflux disease)   . Hyperlipidemia   . Hypertension   . IBS (irritable bowel syndrome)   . 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 Vitamin D deficiency    Immunization History  Administered Date(s) Administered  . Influenza Split 04/10/2013, 03/23/2016  . Influenza, High Dose Seasonal PF 03/24/2014, 02/27/2015  . Pneumococcal Conjugate-13 03/24/2014  . Pneumococcal Polysaccharide-23 06/13/2008    . Td 09/24/2012  . Tdap 09/26/2014  . Zoster 02/23/2011   Past Surgical History:  Procedure Laterality Date  . ABDOMINAL HYSTERECTOMY    . ORIF ANKLE FRACTURE Left 09/26/2014  . ORIF ANKLE FRACTURE Left 09/26/2014   Procedure: OPEN REDUCTION INTERNAL FIXATION (ORIF) ANKLE FRACTURE;  Surgeon: [name], MD;  Location: [facility];  Service: Orthopedics;  Laterality: Left;  . TONSILLECTOMY     FHx:    Reviewed / unchanged  SHx:    Reviewed / unchanged  Systems Review:  Constitutional: Denies fever, chills, wt changes, headaches, insomnia, fatigue, night sweats, change in appetite. Eyes: Denies redness, blurred vision, diplopia, discharge, itchy, watery eyes.  ENT: Denies discharge, congestion, post nasal drip, epistaxis, sore throat, earache, hearing loss, dental pain, tinnitus, vertigo, sinus pain, snoring.  CV: Denies chest pain, palpitations, irregular heartbeat, syncope, dyspnea, diaphoresis, orthopnea, PND, claudication or edema. Respiratory: denies cough, dyspnea, DOE, pleurisy, hoarseness, laryngitis, wheezing.  Gastrointestinal: Denies dysphagia, odynophagia, heartburn, reflux, water brash, abdominal pain or cramps, nausea, vomiting, bloating, diarrhea, constipation, hematemesis, melena, hematochezia  or hemorrhoids. Genitourinary: Denies dysuria, frequency, urgency, nocturia, hesitancy, discharge, hematuria or flank pain. Musculoskeletal: Denies arthralgias, myalgias, stiffness, jt. swelling, pain, limping or strain/sprain.  Skin: Denies pruritus, rash, hives, warts, acne, eczema or change in skin lesion(s). Neuro: No weakness, tremor, incoordination, spasms, paresthesia or pain. Psychiatric: Denies confusion, memory loss or sensory loss. Endo: Denies change in weight, skin or hair change.  Heme/Lymph: No excessive bleeding, bruising or enlarged lymph nodes.  Physical Exam  BP 106/76   Pulse 68   Temp (!) 97.5 F (36.4 C)   Resp 16   Ht 5\' 5"  (1.651 m)   Wt 160 lb 3.2 oz  (72.7 kg)   BMI 26.66 kg/m   Appears well nourished, well groomed  and in no distress.  Eyes: PERRLA, EOMs, conjunctiva no swelling or erythema. Sinuses: No frontal/maxillary tenderness ENT/Mouth: EAC's clear, TM's nl w/o erythema, bulging. Nares clear w/o erythema, swelling, exudates. Oropharynx clear without erythema or exudates. Oral hygiene is good. Tongue normal, non obstructing. Hearing intact.  Neck: Supple. Thyroid nl. Car 2+/2+ without bruits, nodes or JVD. Chest: Respirations nl with BS clear & equal w/o rales, rhonchi, wheezing or stridor.  Cor: Heart sounds normal w/ regular rate and rhythm without sig. murmurs, gallops, clicks or rubs. Peripheral pulses normal and equal  without edema.  Abdomen: Soft & bowel sounds normal. Non-tender w/o guarding, rebound, hernias, masses or organomegaly.  Lymphatics: Unremarkable.  Musculoskeletal: Full ROM all peripheral extremities, joint stability, 5/5 strength and normal gait.  Skin: Warm, dry without exposed rashes, lesions or ecchymosis apparent.  Neuro: Cranial nerves intact, reflexes equal bilaterally. Sensory-motor testing grossly intact. Tendon reflexes grossly intact.  Pysch: Alert & oriented x 3.  Insight and judgement nl & appropriate. No ideations.  Assessment and Plan:  1. Essential hypertension  - Continue medication, monitor blood pressure at home.  -  Continue DASH diet. Reminder to go to the ER if any CP,  SOB, nausea, dizziness, severe HA, changes vision/speech.  - CBC with Differential/Platelet - BASIC METABOLIC PANEL WITH GFR - Magnesium - TSH  2. Hyperlipidemia, mixed  - Continue diet/meds, exercise,& lifestyle modifications.  - Continue monitor periodic cholesterol/liver & renal functions   - Hepatic function panel - Lipid panel - TSH  3. Controlled type 2 diabetes mellitus with stage 2 chronic kidney disease, without long-term current use of insulin (HCC)  - Continue diet, exercise, lifestyle  modifications.  - Monitor appropriate labs. - Hemoglobin A1c - Insulin, random  4. Vitamin D deficiency  - Continue supplementation.  - VITAMIN D 25 Hydroxy  5. Medication management  - CBC with Differential/Platelet - BASIC METABOLIC PANEL WITH GFR - Hepatic function panel - Magnesium - Lipid panel - TSH - Hemoglobin A1c - Insulin, random - VITAMIN D 25 Hydroxy       Discussed  regular exercise, BP monitoring, weight control to achieve/maintain BMI less than 25 and discussed med and SE's. Recommended labs to assess and monitor clinical status with further disposition pending results of labs. Over 30 minutes of exam, counseling, chart review was performed.

## 2017-01-12 NOTE — Patient Instructions (Signed)

## 2017-01-13 LAB — VITAMIN D 25 HYDROXY (VIT D DEFICIENCY, FRACTURES): Vit D, 25-Hydroxy: 72 ng/mL (ref 30–100)

## 2017-01-13 LAB — HEMOGLOBIN A1C
Hgb A1c MFr Bld: 5.8 % — ABNORMAL HIGH (ref <5.7)
Mean Plasma Glucose: 120 mg/dL

## 2017-01-13 LAB — INSULIN, RANDOM: Insulin: 55.6 u[IU]/mL — ABNORMAL HIGH (ref 2.0–19.6)

## 2017-01-13 LAB — MAGNESIUM: Magnesium: 2 mg/dL (ref 1.5–2.5)

## 2017-02-14 ENCOUNTER — Other Ambulatory Visit: Payer: Self-pay | Admitting: Rheumatology

## 2017-02-14 MED ORDER — HYDROCODONE-ACETAMINOPHEN 5-325 MG PO TABS: 1.0000 | ORAL_TABLET | Freq: Two times a day (BID) | ORAL | 0 refills | Status: DC | PRN

## 2017-02-14 NOTE — Telephone Encounter (Signed)
Ok. Please ask to try tapering it to QHS.

## 2017-02-14 NOTE — Telephone Encounter (Signed)
Patient is requesting refill of hydrocodone.

## 2017-02-14 NOTE — Telephone Encounter (Signed)
Last Visit: 08/26/16 Next Visit: 02/27/17 UDS: 10/24/16  Narc agreement: 01/18/16   Okay to refill Hydrocodone?

## 2017-02-14 NOTE — Telephone Encounter (Signed)
Attempted to contact the patient and left message for patient to call the office.  

## 2017-02-20 NOTE — Progress Notes (Deleted)
Office Visit Note  Patient: Debra Short             Date of Birth: 07-15-1940           MRN: [id number]             PCP: Unk [name], MD Referring: Unk [name], MD Visit Date: 02/27/2017 Occupation: @GUAROCC @    Subjective:  No chief complaint on file.   History of Present Illness: Debra Short is a 76 y.o. female ***   Activities of Daily Living:  Patient reports morning stiffness for *** {minute/hour:19697}.   Patient {ACTIONS;DENIES/REPORTS:21021675::"Denies"} nocturnal pain.  Difficulty dressing/grooming: {ACTIONS;DENIES/REPORTS:21021675::"Denies"} Difficulty climbing stairs: {ACTIONS;DENIES/REPORTS:21021675::"Denies"} Difficulty getting out of chair: {ACTIONS;DENIES/REPORTS:21021675::"Denies"} Difficulty using hands for taps, buttons, cutlery, and/or writing: {ACTIONS;DENIES/REPORTS:21021675::"Denies"}   No Rheumatology ROS completed.   PMFS History:  Patient Active Problem List   Diagnosis Date Noted  . Trapezius muscle spasm 08/25/2016  . History of migraine 08/25/2016  . History of depression 08/25/2016  . History of high cholesterol 08/25/2016  . Primary osteoarthritis of both hands 07/20/2016  . Degenerative disc disease, lumbar 07/20/2016  . Primary osteoarthritis of both knees 07/20/2016  . Fibromyalgia syndrome 07/20/2016  . DM type 2 causing CKD stage 2 ([place]) 10/04/2015  . BMI 27.0-27.9,adult 05/12/2015  . Depression with anxiety 10/06/2014  . Insomnia 10/06/2014  . Medication management 10/09/2013  . Hyperlipidemia   . Hypertension   . GERD (gastroesophageal reflux disease)   . Vitamin D deficiency   . IBS (irritable bowel syndrome)   . Diabetes mellitus type 2, controlled ([place])   . Non Hodgkin's lymphoma ([place])     Past Medical History:  Diagnosis Date  . Fibromyalgia   . GERD (gastroesophageal reflux disease)   . Hyperlipidemia   . Hypertension   . IBS (irritable bowel syndrome)   . 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 Vitamin D  deficiency     Family History  Problem Relation Age of Onset  . Cancer Mother   . Cancer Father   . Cancer Brother   . Cancer Maternal Grandfather    Past Surgical History:  Procedure Laterality Date  . ABDOMINAL HYSTERECTOMY    . ORIF ANKLE FRACTURE Left 09/26/2014  . ORIF ANKLE FRACTURE Left 09/26/2014   Procedure: OPEN REDUCTION INTERNAL FIXATION (ORIF) ANKLE FRACTURE;  Surgeon: [name], MD;  Location: [facility];  Service: Orthopedics;  Laterality: Left;  . TONSILLECTOMY     Social History   Social History Narrative  . No narrative on file     Objective: Vital Signs: There were no vitals taken for this visit.   Physical Exam   Musculoskeletal Exam: ***  CDAI Exam: No CDAI exam completed.    Investigation: No additional findings. CBC Latest Ref Rng & Units 01/12/2017 08/24/2016 07/07/2016  WBC 3.8 - 10.8 K/uL 7.3 8.2 5.8  Hemoglobin 11.7 - 15.5 g/dL 13.5 13.1 12.9  Hematocrit 35.0 - 45.0 % 41.3 40.1 39.7  Platelets 140 - 400 K/uL 310 306 300   CMP Latest Ref Rng & Units 01/12/2017 08/24/2016 07/07/2016  Glucose 65 - 99 mg/dL 105(H) 96 99  BUN 7 - 25 mg/dL 15 17 15   Creatinine 0.60 - 0.93 mg/dL 0.93 0.77 0.78  Sodium 135 - 146 mmol/L 140 139 142  Potassium 3.5 - 5.3 mmol/L 4.6 4.6 5.0  Chloride 98 - 110 mmol/L 99 98 103  CO2 20 - 31 mmol/L 29 34(H) 29  Calcium 8.6 - 10.4 mg/dL 9.8 9.7 9.5  Total Protein 6.1 - 8.1 g/dL 6.7 6.9 6.7  Total Bilirubin 0.2 - 1.2 mg/dL 0.7 1.4(H) 0.6  Alkaline Phos 33 - 130 U/L 65 64 71  AST 10 - 35 U/L 13 21 16   ALT 6 - 29 U/L 13 16 15     Imaging: No results found.  Speciality Comments: No specialty comments available.    Procedures:  No procedures performed Allergies: Effexor [venlafaxine]; Nortriptyline; Paxil [paroxetine hcl]; Robaxin [methocarbamol]; Tizanidine; and Zoloft [sertraline hcl]   Assessment / Plan:     Visit Diagnoses: Fibromyalgia syndrome  Primary osteoarthritis of both hands  Primary osteoarthritis of  both knees  Degenerative disc disease, lumbar  Vitamin D deficiency  History of depression  History of high cholesterol  History of migraine  History of hypertension  History of diabetes mellitus  History of gastroesophageal reflux (GERD)  History of lymphoma ([facility])    Orders: No orders of the defined types were placed in this encounter.  No orders of the defined types were placed in this encounter.   Face-to-face time spent with patient was *** minutes. 50% of time was spent in counseling and coordination of care.  Follow-Up Instructions: No Follow-up on file.   Amy Littrell, RT  Note - This record has been created using [employer].  Chart creation errors have been sought, but may not always  have been located. Such creation errors do not reflect on  the standard of medical care.

## 2017-02-21 NOTE — Progress Notes (Signed)
Office Visit Note  Patient: Debra Short             Date of Birth: 12/22/1940           MRN: [id number]             PCP: Unk [name], MD Referring: Unk [name], MD Visit Date: 02/22/2017 Occupation: @GUAROCC @    Subjective:  Left knee pain.   History of Present Illness: Debra Short is a 76 y.o. female with history of fibromyalgia syndrome and osteoarthritis. She states she's been having pain and discomfort in her left knee which feels warm. She also has discomfort in her right shoulder and she has difficulty combing her hair. She continues to keep a lot of discomfort. She states she does need hydrocodone to function on regular basis. She describes her pain on scale of 0-10 about 8 without hydrocodone and about 4 with hydrocodone.  Activities of Daily Living:  Patient reports morning stiffness for 2 hours.   Patient Reports nocturnal pain.  Difficulty dressing/grooming: Reports Difficulty climbing stairs: Reports Difficulty getting out of chair: Reports Difficulty using hands for taps, buttons, cutlery, and/or writing: Reports   Review of Systems  Constitutional: Positive for fatigue. Negative for night sweats, weight gain, weight loss and weakness.  HENT: Positive for mouth dryness. Negative for mouth sores, trouble swallowing, trouble swallowing and nose dryness.   Eyes: Positive for dryness. Negative for pain, redness and visual disturbance.  Respiratory: Negative.  Negative for cough, shortness of breath and difficulty breathing.   Cardiovascular: Negative.  Negative for chest pain, palpitations, hypertension, irregular heartbeat and swelling in legs/feet.  Gastrointestinal: Negative.  Negative for blood in stool, constipation and diarrhea.  Endocrine: Negative for increased urination.  Genitourinary: Negative for vaginal dryness.  Musculoskeletal: Positive for arthralgias, joint pain, joint swelling and morning stiffness. Negative for myalgias, muscle  weakness, muscle tenderness and myalgias.  Skin: Negative for color change, rash, hair loss, skin tightness, ulcers and sensitivity to sunlight.  Allergic/Immunologic: Negative for susceptible to infections.  Neurological: Positive for numbness. Negative for dizziness, headaches, memory loss and night sweats.  Hematological: Negative for swollen glands.  Psychiatric/Behavioral: Positive for depressed mood. Negative for sleep disturbance. The patient is not nervous/anxious.     PMFS History:  Patient Active Problem List   Diagnosis Date Noted  . Trapezius muscle spasm 08/25/2016  . History of migraine 08/25/2016  . History of depression 08/25/2016  . History of high cholesterol 08/25/2016  . Primary osteoarthritis of both hands 07/20/2016  . Degenerative disc disease, lumbar 07/20/2016  . Primary osteoarthritis of both knees 07/20/2016  . Fibromyalgia syndrome 07/20/2016  . DM type 2 causing CKD stage 2 ([place]) 10/04/2015  . BMI 27.0-27.9,adult 05/12/2015  . Depression with anxiety 10/06/2014  . Insomnia 10/06/2014  . Medication management 10/09/2013  . Hyperlipidemia   . Hypertension   . GERD (gastroesophageal reflux disease)   . Vitamin D deficiency   . IBS (irritable bowel syndrome)   . Diabetes mellitus type 2, controlled ([place])   . Non Hodgkin's lymphoma ([place])     Past Medical History:  Diagnosis Date  . Fibromyalgia   . GERD (gastroesophageal reflux disease)   . Hyperlipidemia   . Hypertension   . IBS (irritable bowel syndrome)   . 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 Vitamin D deficiency     Family History  Problem Relation Age of Onset  . Cancer Mother   . Cancer Father   . Cancer  Brother   . Cancer Maternal Grandfather    Past Surgical History:  Procedure Laterality Date  . ABDOMINAL HYSTERECTOMY    . ORIF ANKLE FRACTURE Left 09/26/2014  . ORIF ANKLE FRACTURE Left 09/26/2014   Procedure: OPEN REDUCTION INTERNAL FIXATION (ORIF) ANKLE FRACTURE;  Surgeon: [name],  MD;  Location: [facility];  Service: Orthopedics;  Laterality: Left;  . TONSILLECTOMY     Social History   Social History Narrative  . No narrative on file     Objective: Vital Signs: BP (!) 98/52 (BP Location: Right Arm, Patient Position: Sitting, Cuff Size: Normal)   Pulse 70   Ht 5\' 5"  (1.651 m)   Wt 161 lb (73 kg)   BMI 26.79 kg/m    Physical Exam  Constitutional: She is oriented to person, place, and time. She appears well-developed and well-nourished.  HENT:  Head: Normocephalic and atraumatic.  Eyes: Conjunctivae and EOM are normal.  Neck: Normal range of motion.  Cardiovascular: Normal rate, regular rhythm, normal heart sounds and intact distal pulses.   Pulmonary/Chest: Effort normal and breath sounds normal.  Abdominal: Soft. Bowel sounds are normal.  Lymphadenopathy:    She has no cervical adenopathy.  Neurological: She is alert and oriented to person, place, and time.  Skin: Skin is warm and dry. Capillary refill takes less than 2 seconds.  Psychiatric: She has a normal mood and affect. Her behavior is normal.  Nursing note and vitals reviewed.    Musculoskeletal Exam: C-spine and thoracic spine good range of motion she has limited range of motion of her lumbar spine. She is painful range of motion of her right shoulder joint. Elbow joints wrist joints with good range of motion. There was no synovitis in her hands. She has discomfort range of motion of her left knee joint without any warmth swelling or effusion. Although joints afford range of motion.  CDAI Exam: No CDAI exam completed.    Investigation: No additional findings. CBC Latest Ref Rng & Units 01/12/2017 08/24/2016 07/07/2016  WBC 3.8 - 10.8 K/uL 7.3 8.2 5.8  Hemoglobin 11.7 - 15.5 g/dL 13.5 13.1 12.9  Hematocrit 35.0 - 45.0 % 41.3 40.1 39.7  Platelets 140 - 400 K/uL 310 306 300   CMP Latest Ref Rng & Units 01/12/2017 08/24/2016 07/07/2016  Glucose 65 - 99 mg/dL 105(H) 96 99  BUN 7 - 25 mg/dL 15 17 15     Creatinine 0.60 - 0.93 mg/dL 0.93 0.77 0.78  Sodium 135 - 146 mmol/L 140 139 142  Potassium 3.5 - 5.3 mmol/L 4.6 4.6 5.0  Chloride 98 - 110 mmol/L 99 98 103  CO2 20 - 31 mmol/L 29 34(H) 29  Calcium 8.6 - 10.4 mg/dL 9.8 9.7 9.5  Total Protein 6.1 - 8.1 g/dL 6.7 6.9 6.7  Total Bilirubin 0.2 - 1.2 mg/dL 0.7 1.4(H) 0.6  Alkaline Phos 33 - 130 U/L 65 64 71  AST 10 - 35 U/L 13 21 16   ALT 6 - 29 U/L 13 16 15     Imaging: No results found.  Speciality Comments: No specialty comments available.    Procedures:  Large Joint Inj Date/Time: 02/22/2017 3:23 PM Performed by: Bo Merino Authorized by: Bo Merino   Consent Given by:  Patient Site marked: the procedure site was marked   Timeout: prior to procedure the correct patient, procedure, and site was verified   Indications:  Pain Location:  Shoulder Site:  R glenohumeral Prep: patient was prepped and draped in usual sterile fashion  Needle Size:  27 G Needle Length:  1.5 inches Approach:  Posterior Ultrasound Guidance: No   Fluoroscopic Guidance: No   Arthrogram: No   Medications:  1 mL lidocaine 1 %; 40 mg triamcinolone acetonide 40 MG/ML Aspiration Attempted: Yes   Aspirate amount (mL):  0 Patient tolerance:  Patient tolerated the procedure well with no immediate complications   Allergies: Effexor [venlafaxine]; Nortriptyline; Paxil [paroxetine hcl]; Robaxin [methocarbamol]; Tizanidine; and Zoloft [sertraline hcl]   Assessment / Plan:     Visit Diagnoses: Fibromyalgia: She does have some generalized pain and discomfort from fibromyalgia.  Chronic right shoulder pain: Severe pain. After different treatment options were discussed and informed consent was obtained the right shoulder joint was injected with cortisone as described above.  Osteoarthritis of both hands, unspecified osteoarthritis type: Joint protection and muscle strengthening discussed.  Osteoarthritis of both knees, unspecified osteoarthritis  type: She's been having increased pain in her left knee joint but there was no warmth or swelling. Advised her to use Voltaren gel for right now. If her symptoms persist she supposed to notify us.  DDD (degenerative disc disease), lumbar: Chronic pain but tolerable  Chronic pain: She is on hydrocodone due to pain. Her UDS has been monitored every 6 months. She has an active narcotic agreement. I did discuss possible tapering off hydrocodone.  History of non-Hodgkin's lymphoma  History of migraine  History of hypercholesterolemia  History of depression: She's experiencing increased depression due to loss of her dog.  History of hearing loss - left ear  Type 2 diabetes mellitus : Advised to monitor blood sugar closely.   Orders: No orders of the defined types were placed in this encounter.  No orders of the defined types were placed in this encounter.     Follow-Up Instructions: Return in about 6 months (around 08/22/2017) for FMS OA DDD.   Bo Merino, MD  Note - This record has been created using [employer].  Chart creation errors have been sought, but may not always  have been located. Such creation errors do not reflect on  the standard of medical care.

## 2017-02-22 ENCOUNTER — Encounter: Payer: Self-pay | Admitting: Rheumatology

## 2017-02-22 ENCOUNTER — Ambulatory Visit (INDEPENDENT_AMBULATORY_CARE_PROVIDER_SITE_OTHER): Payer: Medicare Other | Admitting: Rheumatology

## 2017-02-22 ENCOUNTER — Other Ambulatory Visit: Payer: Self-pay | Admitting: Internal Medicine

## 2017-02-22 VITALS — BP 98/52 | HR 70 | Ht 65.0 in | Wt 161.0 lb

## 2017-02-22 DIAGNOSIS — Z8659 Personal history of other mental and behavioral disorders: Secondary | ICD-10-CM

## 2017-02-22 DIAGNOSIS — M51369 Other intervertebral disc degeneration, lumbar region without mention of lumbar back pain or lower extremity pain: Secondary | ICD-10-CM

## 2017-02-22 DIAGNOSIS — M19042 Primary osteoarthritis, left hand: Secondary | ICD-10-CM | POA: Diagnosis not present

## 2017-02-22 DIAGNOSIS — M25511 Pain in right shoulder: Secondary | ICD-10-CM | POA: Diagnosis not present

## 2017-02-22 DIAGNOSIS — G894 Chronic pain syndrome: Secondary | ICD-10-CM

## 2017-02-22 DIAGNOSIS — M5136 Other intervertebral disc degeneration, lumbar region: Secondary | ICD-10-CM | POA: Diagnosis not present

## 2017-02-22 DIAGNOSIS — G8929 Other chronic pain: Secondary | ICD-10-CM

## 2017-02-22 DIAGNOSIS — M170 Bilateral primary osteoarthritis of knee: Secondary | ICD-10-CM | POA: Diagnosis not present

## 2017-02-22 DIAGNOSIS — Z8669 Personal history of other diseases of the nervous system and sense organs: Secondary | ICD-10-CM | POA: Diagnosis not present

## 2017-02-22 DIAGNOSIS — Z8572 Personal history of non-Hodgkin lymphomas: Secondary | ICD-10-CM | POA: Diagnosis not present

## 2017-02-22 DIAGNOSIS — E1122 Type 2 diabetes mellitus with diabetic chronic kidney disease: Secondary | ICD-10-CM

## 2017-02-22 DIAGNOSIS — M797 Fibromyalgia: Secondary | ICD-10-CM | POA: Diagnosis not present

## 2017-02-22 DIAGNOSIS — N182 Chronic kidney disease, stage 2 (mild): Secondary | ICD-10-CM

## 2017-02-22 DIAGNOSIS — M19041 Primary osteoarthritis, right hand: Secondary | ICD-10-CM | POA: Diagnosis not present

## 2017-02-22 DIAGNOSIS — Z8639 Personal history of other endocrine, nutritional and metabolic disease: Secondary | ICD-10-CM

## 2017-02-22 MED ORDER — TRIAMCINOLONE ACETONIDE 40 MG/ML IJ SUSP
40.0000 mg | INTRAMUSCULAR | Status: AC | PRN
  Administered 2017-02-22: 40 mg via INTRA_ARTICULAR

## 2017-02-22 MED ORDER — LIDOCAINE HCL 1 % IJ SOLN
1.0000 mL | INTRAMUSCULAR | Status: AC | PRN
  Administered 2017-02-22: 1 mL

## 2017-02-27 ENCOUNTER — Ambulatory Visit: Payer: Self-pay | Admitting: Rheumatology

## 2017-03-23 ENCOUNTER — Other Ambulatory Visit: Payer: Self-pay | Admitting: Internal Medicine

## 2017-03-25 ENCOUNTER — Emergency Department (HOSPITAL_COMMUNITY): Payer: Medicare Other

## 2017-03-25 ENCOUNTER — Inpatient Hospital Stay (HOSPITAL_COMMUNITY)
Admission: EM | Admit: 2017-03-25 | Discharge: 2017-03-29 | DRG: 871 | Disposition: A | Discharge status: Home / Self Care | Payer: Medicare Other | Attending: Internal Medicine | Admitting: Internal Medicine

## 2017-03-25 ENCOUNTER — Encounter (HOSPITAL_COMMUNITY): Payer: Self-pay

## 2017-03-25 DIAGNOSIS — K219 Gastro-esophageal reflux disease without esophagitis: Secondary | ICD-10-CM | POA: Diagnosis present

## 2017-03-25 DIAGNOSIS — I10 Essential (primary) hypertension: Secondary | ICD-10-CM | POA: Diagnosis present

## 2017-03-25 DIAGNOSIS — R0602 Shortness of breath: Secondary | ICD-10-CM | POA: Diagnosis not present

## 2017-03-25 DIAGNOSIS — E785 Hyperlipidemia, unspecified: Secondary | ICD-10-CM | POA: Diagnosis present

## 2017-03-25 DIAGNOSIS — F418 Other specified anxiety disorders: Secondary | ICD-10-CM | POA: Diagnosis present

## 2017-03-25 DIAGNOSIS — J690 Pneumonitis due to inhalation of food and vomit: Secondary | ICD-10-CM | POA: Diagnosis present

## 2017-03-25 DIAGNOSIS — A419 Sepsis, unspecified organism: Secondary | ICD-10-CM | POA: Diagnosis present

## 2017-03-25 DIAGNOSIS — N182 Chronic kidney disease, stage 2 (mild): Secondary | ICD-10-CM | POA: Diagnosis not present

## 2017-03-25 DIAGNOSIS — I959 Hypotension, unspecified: Secondary | ICD-10-CM | POA: Diagnosis present

## 2017-03-25 DIAGNOSIS — J189 Pneumonia, unspecified organism: Secondary | ICD-10-CM

## 2017-03-25 DIAGNOSIS — E7800 Pure hypercholesterolemia, unspecified: Secondary | ICD-10-CM | POA: Diagnosis present

## 2017-03-25 DIAGNOSIS — R4182 Altered mental status, unspecified: Secondary | ICD-10-CM | POA: Diagnosis not present

## 2017-03-25 DIAGNOSIS — Z7982 Long term (current) use of aspirin: Secondary | ICD-10-CM

## 2017-03-25 DIAGNOSIS — K589 Irritable bowel syndrome without diarrhea: Secondary | ICD-10-CM | POA: Diagnosis present

## 2017-03-25 DIAGNOSIS — Z888 Allergy status to other drugs, medicaments and biological substances status: Secondary | ICD-10-CM | POA: Diagnosis not present

## 2017-03-25 DIAGNOSIS — E1122 Type 2 diabetes mellitus with diabetic chronic kidney disease: Secondary | ICD-10-CM | POA: Diagnosis present

## 2017-03-25 DIAGNOSIS — G92 Toxic encephalopathy: Secondary | ICD-10-CM | POA: Diagnosis present

## 2017-03-25 DIAGNOSIS — R0902 Hypoxemia: Secondary | ICD-10-CM | POA: Diagnosis not present

## 2017-03-25 DIAGNOSIS — I340 Nonrheumatic mitral (valve) insufficiency: Secondary | ICD-10-CM | POA: Diagnosis not present

## 2017-03-25 DIAGNOSIS — N390 Urinary tract infection, site not specified: Secondary | ICD-10-CM | POA: Diagnosis present

## 2017-03-25 DIAGNOSIS — Z79899 Other long term (current) drug therapy: Secondary | ICD-10-CM

## 2017-03-25 DIAGNOSIS — J181 Lobar pneumonia, unspecified organism: Secondary | ICD-10-CM

## 2017-03-25 DIAGNOSIS — F419 Anxiety disorder, unspecified: Secondary | ICD-10-CM | POA: Diagnosis present

## 2017-03-25 DIAGNOSIS — Z7984 Long term (current) use of oral hypoglycemic drugs: Secondary | ICD-10-CM

## 2017-03-25 DIAGNOSIS — R404 Transient alteration of awareness: Secondary | ICD-10-CM | POA: Diagnosis not present

## 2017-03-25 DIAGNOSIS — R531 Weakness: Secondary | ICD-10-CM | POA: Diagnosis not present

## 2017-03-25 DIAGNOSIS — I129 Hypertensive chronic kidney disease with stage 1 through stage 4 chronic kidney disease, or unspecified chronic kidney disease: Secondary | ICD-10-CM | POA: Diagnosis present

## 2017-03-25 DIAGNOSIS — C8330 Diffuse large B-cell lymphoma, unspecified site: Secondary | ICD-10-CM | POA: Diagnosis present

## 2017-03-25 DIAGNOSIS — J90 Pleural effusion, not elsewhere classified: Secondary | ICD-10-CM | POA: Diagnosis not present

## 2017-03-25 DIAGNOSIS — E119 Type 2 diabetes mellitus without complications: Secondary | ICD-10-CM | POA: Diagnosis present

## 2017-03-25 DIAGNOSIS — E1169 Type 2 diabetes mellitus with other specified complication: Secondary | ICD-10-CM | POA: Diagnosis present

## 2017-03-25 LAB — URINALYSIS, ROUTINE W REFLEX MICROSCOPIC
Bacteria, UA: NONE SEEN
Bilirubin Urine: NEGATIVE
Glucose, UA: NEGATIVE mg/dL
Hgb urine dipstick: NEGATIVE
Ketones, ur: NEGATIVE mg/dL
Nitrite: NEGATIVE
Protein, ur: NEGATIVE mg/dL
Specific Gravity, Urine: 1.011 (ref 1.005–1.030)
pH: 7 (ref 5.0–8.0)

## 2017-03-25 LAB — BLOOD GAS, ARTERIAL
Acid-Base Excess: 3.4 mmol/L — ABNORMAL HIGH (ref 0.0–2.0)
Bicarbonate: 28.2 mmol/L — ABNORMAL HIGH (ref 20.0–28.0)
Drawn by: 11249
O2 Content: 2 L/min
O2 Saturation: 96.4 %
Patient temperature: 98.6
pCO2 arterial: 46.3 mmHg (ref 32.0–48.0)
pH, Arterial: 7.402 (ref 7.350–7.450)
pO2, Arterial: 89 mmHg (ref 83.0–108.0)

## 2017-03-25 LAB — I-STAT CG4 LACTIC ACID, ED
Lactic Acid, Venous: 2.24 mmol/L (ref 0.5–1.9)
Lactic Acid, Venous: 1.6 mmol/L (ref 0.5–1.9)

## 2017-03-25 LAB — COMPREHENSIVE METABOLIC PANEL
ALT: 18 U/L (ref 14–54)
AST: 23 U/L (ref 15–41)
Albumin: 3.5 g/dL (ref 3.5–5.0)
Alkaline Phosphatase: 47 U/L (ref 38–126)
Anion gap: 11 (ref 5–15)
BUN: 26 mg/dL — ABNORMAL HIGH (ref 6–20)
CO2: 31 mmol/L (ref 22–32)
Calcium: 8.5 mg/dL — ABNORMAL LOW (ref 8.9–10.3)
Chloride: 94 mmol/L — ABNORMAL LOW (ref 101–111)
Creatinine, Ser: 1.06 mg/dL — ABNORMAL HIGH (ref 0.44–1.00)
GFR calc Af Amer: 57 mL/min — ABNORMAL LOW (ref >60)
GFR calc non Af Amer: 49 mL/min — ABNORMAL LOW (ref >60)
Glucose, Bld: 108 mg/dL — ABNORMAL HIGH (ref 65–99)
Potassium: 4.9 mmol/L (ref 3.5–5.1)
Sodium: 136 mmol/L (ref 135–145)
Total Bilirubin: 1.8 mg/dL — ABNORMAL HIGH (ref 0.3–1.2)
Total Protein: 6.5 g/dL (ref 6.5–8.1)

## 2017-03-25 LAB — CBC WITH DIFFERENTIAL/PLATELET
Basophils Absolute: 0 10*3/uL (ref 0.0–0.1)
Basophils Relative: 0 %
Eosinophils Absolute: 0 10*3/uL (ref 0.0–0.7)
Eosinophils Relative: 0 %
HCT: 38.9 % (ref 36.0–46.0)
Hemoglobin: 12.8 g/dL (ref 12.0–15.0)
Lymphocytes Relative: 10 %
Lymphs Abs: 1.7 10*3/uL (ref 0.7–4.0)
MCH: 31.8 pg (ref 26.0–34.0)
MCHC: 32.9 g/dL (ref 30.0–36.0)
MCV: 96.5 fL (ref 78.0–100.0)
Monocytes Absolute: 1.5 10*3/uL — ABNORMAL HIGH (ref 0.1–1.0)
Monocytes Relative: 8 %
Neutro Abs: 15 10*3/uL — ABNORMAL HIGH (ref 1.7–7.7)
Neutrophils Relative %: 82 %
Platelets: 228 10*3/uL (ref 150–400)
RBC: 4.03 MIL/uL (ref 3.87–5.11)
RDW: 13.5 % (ref 11.5–15.5)
WBC: 18.2 10*3/uL — ABNORMAL HIGH (ref 4.0–10.5)

## 2017-03-25 MED ORDER — SODIUM CHLORIDE 0.9 % IV SOLN
1000.0000 mL | INTRAVENOUS | Status: DC
  Administered 2017-03-25 (×2): 1000 mL via INTRAVENOUS

## 2017-03-25 MED ORDER — SODIUM CHLORIDE 0.9 % IV BOLUS (SEPSIS)
500.0000 mL | Freq: Once | INTRAVENOUS | Status: AC
  Administered 2017-03-25: 500 mL via INTRAVENOUS

## 2017-03-25 MED ORDER — SODIUM CHLORIDE 0.9 % IV SOLN: 1000.0000 mL | INTRAVENOUS | Status: DC

## 2017-03-25 MED ORDER — CEFTRIAXONE SODIUM 1 G IJ SOLR
1.0000 g | Freq: Once | INTRAMUSCULAR | Status: AC
  Administered 2017-03-25: 1 g via INTRAVENOUS
  Filled 2017-03-25: qty 10

## 2017-03-25 MED ORDER — SODIUM CHLORIDE 0.9 % IV BOLUS (SEPSIS)
250.0000 mL | Freq: Once | INTRAVENOUS | Status: AC
  Administered 2017-03-25: 250 mL via INTRAVENOUS

## 2017-03-25 MED ORDER — DEXTROSE 5 % IV SOLN
500.0000 mg | INTRAVENOUS | Status: DC
  Administered 2017-03-26 – 2017-03-27 (×2): 500 mg via INTRAVENOUS
  Filled 2017-03-25 (×2): qty 500

## 2017-03-25 MED ORDER — SODIUM CHLORIDE 0.9 % IV BOLUS (SEPSIS)
1000.0000 mL | Freq: Once | INTRAVENOUS | Status: AC
  Administered 2017-03-25: 1000 mL via INTRAVENOUS

## 2017-03-25 MED ORDER — DEXTROSE 5 % IV SOLN
1.0000 g | INTRAVENOUS | Status: DC
  Administered 2017-03-26 – 2017-03-28 (×3): 1 g via INTRAVENOUS
  Filled 2017-03-25 (×4): qty 10

## 2017-03-25 MED ORDER — DEXTROSE 5 % IV SOLN
500.0000 mg | Freq: Once | INTRAVENOUS | Status: AC
  Administered 2017-03-25: 500 mg via INTRAVENOUS
  Filled 2017-03-25: qty 500

## 2017-03-25 NOTE — ED Provider Notes (Signed)
[place] DEPT Provider Note   CSN: [id number] Arrival date & time: 03/25/17  1744     History   Chief Complaint No chief complaint on file.   HPI Debra Short is a 76 y.o. female.  Problem overnight. Patient brought in by EMS. The patient was visited by her daughter was noted to be more confused than usual. The mother was complaining of nausea and vomiting last night stating that was hurting to PE. EMS noted that when they got her up she was neat weekend had some nausea. They noted also her oxygen saturations were 86% on room air and a put her on 4 L. Blood pressure was not low.      Past Medical History:  Diagnosis Date  . Fibromyalgia   . GERD (gastroesophageal reflux disease)   . Hyperlipidemia   . Hypertension   . IBS (irritable bowel syndrome)   . 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 Vitamin D deficiency     Patient Active Problem List   Diagnosis Date Noted  . Trapezius muscle spasm 08/25/2016  . History of migraine 08/25/2016  . History of depression 08/25/2016  . History of high cholesterol 08/25/2016  . Primary osteoarthritis of both hands 07/20/2016  . Degenerative disc disease, lumbar 07/20/2016  . Primary osteoarthritis of both knees 07/20/2016  . Fibromyalgia syndrome 07/20/2016  . 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 BMI 27.0-27.9,adult 05/12/2015  . Depression with anxiety 10/06/2014  . Insomnia 10/06/2014  . Medication management 10/09/2013  . Hyperlipidemia   . Hypertension   . GERD (gastroesophageal reflux disease)   . Vitamin D deficiency   . IBS (irritable bowel syndrome)   . Diabetes mellitus type 2, controlled ([place])   . Non Hodgkin's lymphoma [facility])     Past Surgical History:  Procedure Laterality Date  . ABDOMINAL HYSTERECTOMY    . ORIF ANKLE FRACTURE Left 09/26/2014  . ORIF ANKLE FRACTURE Left 09/26/2014   Procedure: OPEN REDUCTION INTERNAL FIXATION (ORIF) ANKLE FRACTURE;  Surgeon: [name], MD;  Location: [facility];   Service: Orthopedics;  Laterality: Left;  . TONSILLECTOMY      OB History    No data available       Home Medications    Prior to Admission medications   Medication Sig Start Date End Date Taking? Authorizing Provider  acetaminophen (TYLENOL) 650 MG CR tablet Take 650 mg by mouth every 8 (eight) hours as needed for pain.   Yes [provider]  ALPRAZolam (XANAX) 1 MG tablet TAKE 1/2 TO 1 TABLET BY MOUTH TWO TO THREE TIMES DAILY AS NEEDED FOR ANXIETY 09/15/16  Yes Unk [name], MD  aspirin EC 81 MG tablet Take 81 mg by mouth daily.   Yes [provider]  atorvastatin (LIPITOR) 80 MG tablet TAKE 1/3 TABLET BY MOUTH DAILY FOR CHOLESTEROL. DISCONTINUE PRAVASTATIN 05/03/16  Yes Unk [name], MD  bisoprolol (ZEBETA) 5 MG tablet TAKE 1 TABLET EVERY MORNING FOR BLOOD PRESSURE 01/07/17  Yes Unk [name], MD  Esomeprazole Magnesium (NEXIUM PO) Take 22.3 mg by mouth See admin instructions. Take 1 capsule (22.3 mg OTC) by mouth every morning before breakfast, may also take 1 capsule in the evening as needed for acid reflux   Yes [provider]  FLUoxetine (PROZAC) 40 MG capsule TAKE 1 CAPSULE (40 MG TOTAL) BY MOUTH DAILY. 11/03/16  Yes Unk [name], MD  fluticasone [facility]) 50 MCG/ACT nasal spray PLACE 2 SPRAYS INTO BOTH NOSTRILS DAILY. 09/19/16  Yes Unk [name], MD  gabapentin (NEURONTIN) 300 MG capsule TAKE 2 CAPSULES TWICE A DAY Patient taking differently: TAKE 2 CAPSULES AT BEDTIME 02/22/17  Yes Unk [name], MD  metFORMIN (GLUCOPHAGE-XR) 500 MG 24 hr tablet TAKE 1 TABLET EVERY DAY WITH BREAKFAST 11/03/16  Yes Unk [name], MD  montelukast (SINGULAIR) 10 MG tablet TAKE 1 TABLET EVERY DAY AS NEEDED FOR ALLERGIES 03/23/17  Yes Unk [name], MD  Multiple Vitamin (MULTIVITAMIN WITH MINERALS) TABS tablet Take 1 tablet by mouth daily.   Yes [provider]  ondansetron (ZOFRAN) 4 MG tablet Take 1 tablet (4 mg total) by mouth every 8 (eight)  hours as needed for nausea or vomiting. 08/24/16 08/24/17 Yes Forcucci, Courtney, PA-C  oxybutynin (DITROPAN) 5 MG tablet Take 5 mg by mouth 2 (two) times daily as needed for bladder spasms.  03/21/17  Yes [provider]  triamcinolone (NASACORT) 55 MCG/ACT AERO nasal inhaler Place 2 sprays into both nostrils daily as needed (for seasonal allergies).    Yes [provider]  HYDROcodone-acetaminophen (NORCO/VICODIN) 5-325 MG tablet Take 1 tablet by mouth 2 (two) times daily as needed for moderate pain. Patient not taking: Reported on 03/25/2017 02/14/17   Bo Merino, MD    Family History Family History  Problem Relation Age of Onset  . Cancer Mother   . Cancer Father   . Cancer Brother   . Cancer Maternal Grandfather     Social History Social History  Substance Use Topics  . Smoking status: Never Smoker  . Smokeless tobacco: Never Used  . Alcohol use No     Allergies   Effexor [venlafaxine]; Nortriptyline; Paxil [paroxetine hcl]; Robaxin [methocarbamol]; Tizanidine; and Zoloft [sertraline hcl]   Review of Systems Review of Systems  Constitutional: Positive for fatigue and fever.  HENT: Positive for sore throat.   Eyes: Negative for redness.  Respiratory: Negative for choking and shortness of breath.   Cardiovascular: Negative for chest pain.  Gastrointestinal: Positive for nausea and vomiting.  Genitourinary: Positive for dysuria and frequency.  Musculoskeletal: Negative for back pain.  Skin: Negative for rash.  Neurological: Negative for headaches.  Hematological: Does not bruise/bleed easily.  Psychiatric/Behavioral: Positive for confusion.     Physical Exam Updated Vital Signs BP (!) 98/44   Pulse 71   Temp 98 F (36.7 C) (Oral)   Resp 15   Ht 1.651 m (5\' 5" )   Wt 73 kg (161 lb)   SpO2 99%   BMI 26.79 kg/m   Physical Exam  Constitutional: She appears well-developed and well-nourished. No distress.  HENT:  Head: Normocephalic and  atraumatic.  Mucous membranes dry.  Eyes: Pupils are equal, round, and reactive to light. Conjunctivae and EOM are normal.  Neck: Normal range of motion. Neck supple.  Cardiovascular: Normal rate and regular rhythm.   Pulmonary/Chest: Effort normal and breath sounds normal. She has no rales.  Abdominal: Soft. Bowel sounds are normal.  Musculoskeletal: Normal range of motion. She exhibits no edema.  Neurological: She is alert. No cranial nerve deficit. She exhibits normal muscle tone. Coordination normal.  Skin: Skin is warm. No rash noted.  Nursing note and vitals reviewed.    ED Treatments / Results  Labs (all labs ordered are listed, but only abnormal results are displayed) Labs Reviewed  COMPREHENSIVE METABOLIC PANEL - Abnormal; Notable for the following:       Result Value   Chloride 94 (*)    Glucose, Bld 108 (*)    BUN 26 (*)  Creatinine, Ser 1.06 (*)    Calcium 8.5 (*)    Total Bilirubin 1.8 (*)    GFR calc non Af Amer 49 (*)    GFR calc Af Amer 57 (*)    All other components within normal limits  CBC WITH DIFFERENTIAL/PLATELET - Abnormal; Notable for the following:    WBC 18.2 (*)    Neutro Abs 15.0 (*)    Monocytes Absolute 1.5 (*)    All other components within normal limits  URINALYSIS, ROUTINE W REFLEX MICROSCOPIC - Abnormal; Notable for the following:    Leukocytes, UA MODERATE (*)    Squamous Epithelial / LPF 0-5 (*)    All other components within normal limits  I-STAT CG4 LACTIC ACID, ED - Abnormal; Notable for the following:    Lactic Acid, Venous 2.24 (*)    All other components within normal limits  CULTURE, BLOOD (ROUTINE X 2)  CULTURE, BLOOD (ROUTINE X 2)  URINE CULTURE  BLOOD GAS, ARTERIAL  I-STAT CG4 LACTIC ACID, ED  I-STAT CG4 LACTIC ACID, ED    EKG  EKG Interpretation  Date/Time:  Saturday March 25 2017 18:30:34 EDT Ventricular Rate:  70 PR Interval:    QRS Duration: 85 QT Interval:  435 QTC Calculation: 470 R Axis:   -22 Text  Interpretation:  Sinus rhythm Probable left atrial enlargement Borderline left axis deviation Low voltage, precordial leads No significant change since last tracing Confirmed by Fredia Sorrow [phone]) on 03/25/2017 6:43:13 PM       Radiology Dg Chest 2 View  Result Date: 03/25/2017 CLINICAL DATA:  Fever. EXAM: CHEST  2 VIEW COMPARISON:  Chest x-ray dated October 03, 2015. FINDINGS: The cardiomediastinal silhouette is borderline enlarged, similar to prior study. Atherosclerotic calcification of the aortic arch. Left lower lobe consolidation with adjacent small pleural effusion. Right basilar atelectasis. No pneumothorax. No acute osseous abnormality. IMPRESSION: Left lower lobe pneumonia with adjacent small pleural effusion. Electronically Signed   By: Titus Dubin M.D.   On: 03/25/2017 19:04    Procedures Procedures (including critical care time)  CRITICAL CARE Performed by: Fredia Sorrow Total critical care time: 30 minutes Critical care time was exclusive of separately billable procedures and treating other patients. Critical care was necessary to treat or prevent imminent or life-threatening deterioration. Critical care was time spent personally by me on the following activities: development of treatment plan with patient and/or surrogate as well as nursing, discussions with consultants, evaluation of patient's response to treatment, examination of patient, obtaining history from patient or surrogate, ordering and performing treatments and interventions, ordering and review of laboratory studies, ordering and review of radiographic studies, pulse oximetry and re-evaluation of patient's condition.   Medications Ordered in ED Medications  0.9 %  sodium chloride infusion (0 mLs Intravenous Hold 03/25/17 1920)  azithromycin (ZITHROMAX) 500 mg in dextrose 5 % 250 mL IVPB (not administered)  cefTRIAXone (ROCEPHIN) 1 g in dextrose 5 % 50 mL IVPB (not administered)  sodium chloride 0.9 %  bolus 500 mL (0 mLs Intravenous Stopped 03/25/17 1935)  sodium chloride 0.9 % bolus 1,000 mL (0 mLs Intravenous Stopped 03/25/17 2021)    And  sodium chloride 0.9 % bolus 1,000 mL (0 mLs Intravenous Stopped 03/25/17 2118)    And  sodium chloride 0.9 % bolus 250 mL (0 mLs Intravenous Stopped 03/25/17 2148)  cefTRIAXone (ROCEPHIN) 1 g in dextrose 5 % 50 mL IVPB (0 g Intravenous Stopped 03/25/17 2014)  azithromycin (ZITHROMAX) 500 mg in dextrose 5 % 250 mL  IVPB (0 mg Intravenous Stopped 03/25/17 2123)     Initial Impression / Assessment and Plan / ED Course  I have reviewed the triage vital signs and the nursing notes.  Pertinent labs & imaging results that were available during my care of the patient were reviewed by me and considered in my medical decision making (see chart for details).    The patient was visited by her daughter today she was noted to be disoriented more confused than baseline. Patient nausea and vomiting last night. Patient appeared dehydrated to family members. Patient was complaining of burning urination. Urinary frequency. EMS noted that patient was dizzy and weak and had nausea upon standing.  Upon arrival the patient was febrile 100.7. Not tachycardic not hypotensive. Respiratory rate up a little bit. Patient was on 2 L of oxygen. If turned off patient's oxygen sats went down to 88%.  Sepsis protocol was ordered however fluid challenge was not ordered nor antibiotics because patient didn't have any hypotension.  Lactic acid came back and it was less than 4 but it was greater than 2 was 2.24. Patient then went on to get hypotension. Blood pressure down as low as 78 systolic. Fluid challenge ordered patient was to receive 2.250 L.  On the 2 L of oxygen patient's options sats were in the mid to upper 90s. The fluid improved her blood pressure currently systolic is 98. Chest x-ray was consistent with left lower lobe pneumonia. Patient started on community-acquired  antibiotics for pneumonia.   patient also had a leukocytosis. White count was 18,000.  Blood gas was done which showed no significant acidosis no significant CO2 retention. Patient overall improving and feels better. He requested to go home. Clearly patient requires admission. Will discuss with hospitalist for step down admission. Patient does not require critical care admission at this time.    Final Clinical Impressions(s) / ED Diagnoses   Final diagnoses:  Community acquired pneumonia of left lower lobe of lung ([place])  Hypoxia  Sepsis, due to unspecified organism [facility])    New Prescriptions New Prescriptions   No medications on file     Fredia Sorrow, MD 03/25/17 2218

## 2017-03-25 NOTE — Progress Notes (Signed)
PHARMACY NOTE -  Ceftriaxone/Azithromycin  Pharmacy has been consulted to dose Ceftriaxone and Azithromycin for CAP. Will order Ceftriaxone 1g IV q24h and Azithromycin 500 mg IV q24h x 7 days.  No renal adjustment needed; therefore, pharmacy will sign off since need for further dosage adjustment appears unlikely at present.    Please reconsult if a change in clinical status warrants re-evaluation of dosage.  Thank you for the consult.  Hershal Coria, PharmD Pager: [phone] 03/25/2017

## 2017-03-25 NOTE — ED Notes (Signed)
Patient transported to X-ray 

## 2017-03-25 NOTE — H&P (Addendum)
TR[facility] H&P   Patient Demographics:    Debra Short, is a 76 y.o. female  MRN: [id number]   DOB - 13-Nov-1940  Admit Date - 03/25/2017  Outpatient Primary MD for the patient is Unk [name], MD  Referring MD/NP/PA: Aundria Rud   Outpatient Specialists:   Patient coming from: home  No chief complaint on file.   N/v   HPI:    Debra Short  is a 76 y.o. female, w hypertension, hyperlipidemia, large cell lymphoma, gerd, who apparently presented due to n/v, and fever, and altered mental status per her daughter.  Pt had 100.8 temp per daughter and seemed slightly confused and thus brought her to the hospital.   In ED. Pt admitted to dysuria, and slight dry cough as well as fever.  Pt denies cp, palp, abd pain, diarrhea, brbpr, black stool.    Wbc 18.2, Hgb 12.8, Plt 228 Bun/creatinine 26/1.06 Ast 23, Alt 18  Ua=> wbc 6-30, rbc 0-5 CXR => left lower lobe pneumonia with adjacent small pleural effusion.   Pt will be admitted for AMS secondary to uti, and left lower lung pneumonia.        Review of systems:    In addition to the HPI above,    No Headache, No changes with Vision or hearing, No problems swallowing food or Liquids, No Chest pain, Cough or Shortness of Breath, No Abdominal pain, Bowel movements are regular, No Blood in stool or Urine,  No new skin rashes or bruises, No new joints pains-aches,  No new weakness, tingling, numbness in any extremity, No recent weight gain or loss, No polyuria, polydypsia or polyphagia, No significant Mental Stressors.  A full 10 point Review of Systems was done, except as stated above, all other Review of Systems were negative.   With Past History of the following :    Past Medical History:  Diagnosis Date  . Fibromyalgia   . GERD (gastroesophageal reflux disease)   . Hyperlipidemia   . Hypertension   . IBS  (irritable bowel syndrome)   . 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 Vitamin D deficiency       Past Surgical History:  Procedure Laterality Date  . ABDOMINAL HYSTERECTOMY    . ORIF ANKLE FRACTURE Left 09/26/2014  . ORIF ANKLE FRACTURE Left 09/26/2014   Procedure: OPEN REDUCTION INTERNAL FIXATION (ORIF) ANKLE FRACTURE;  Surgeon: [name], MD;  Location: [facility];  Service: Orthopedics;  Laterality: Left;  . TONSILLECTOMY        Social History:     Social History  Substance Use Topics  . Smoking status: Never Smoker  . Smokeless tobacco: Never Used  . Alcohol use No     Lives - at home  Mobility - walks by self  Family History :     Family History  Problem Relation Age of Onset  . Cancer Mother   .  Cancer Father   . Cancer Brother   . Cancer Maternal Grandfather       Home Medications:   Prior to Admission medications   Medication Sig Start Date End Date Taking? Authorizing Provider  acetaminophen (TYLENOL) 650 MG CR tablet Take 650 mg by mouth every 8 (eight) hours as needed for pain.   Yes [provider]  ALPRAZolam (XANAX) 1 MG tablet TAKE 1/2 TO 1 TABLET BY MOUTH TWO TO THREE TIMES DAILY AS NEEDED FOR ANXIETY 09/15/16  Yes Unk [name], MD  aspirin EC 81 MG tablet Take 81 mg by mouth daily.   Yes [provider]  atorvastatin (LIPITOR) 80 MG tablet TAKE 1/3 TABLET BY MOUTH DAILY FOR CHOLESTEROL. DISCONTINUE PRAVASTATIN 05/03/16  Yes Unk [name], MD  bisoprolol (ZEBETA) 5 MG tablet TAKE 1 TABLET EVERY MORNING FOR BLOOD PRESSURE 01/07/17  Yes Unk [name], MD  Esomeprazole Magnesium (NEXIUM PO) Take 22.3 mg by mouth See admin instructions. Take 1 capsule (22.3 mg OTC) by mouth every morning before breakfast, may also take 1 capsule in the evening as needed for acid reflux   Yes [provider]  FLUoxetine (PROZAC) 40 MG capsule TAKE 1 CAPSULE (40 MG TOTAL) BY MOUTH DAILY. 11/03/16  Yes Unk [name], MD  fluticasone [facility]) 50  MCG/ACT nasal spray PLACE 2 SPRAYS INTO BOTH NOSTRILS DAILY. 09/19/16  Yes Unk [name], MD  gabapentin (NEURONTIN) 300 MG capsule TAKE 2 CAPSULES TWICE A DAY Patient taking differently: TAKE 2 CAPSULES AT BEDTIME 02/22/17  Yes Unk [name], MD  metFORMIN (GLUCOPHAGE-XR) 500 MG 24 hr tablet TAKE 1 TABLET EVERY DAY WITH BREAKFAST 11/03/16  Yes Unk [name], MD  montelukast (SINGULAIR) 10 MG tablet TAKE 1 TABLET EVERY DAY AS NEEDED FOR ALLERGIES 03/23/17  Yes Unk [name], MD  Multiple Vitamin (MULTIVITAMIN WITH MINERALS) TABS tablet Take 1 tablet by mouth daily.   Yes [provider]  ondansetron (ZOFRAN) 4 MG tablet Take 1 tablet (4 mg total) by mouth every 8 (eight) hours as needed for nausea or vomiting. 08/24/16 08/24/17 Yes Forcucci, Courtney, PA-C  oxybutynin (DITROPAN) 5 MG tablet Take 5 mg by mouth 2 (two) times daily as needed for bladder spasms.  03/21/17  Yes [provider]  triamcinolone (NASACORT) 55 MCG/ACT AERO nasal inhaler Place 2 sprays into both nostrils daily as needed (for seasonal allergies).    Yes [provider]  HYDROcodone-acetaminophen (NORCO/VICODIN) 5-325 MG tablet Take 1 tablet by mouth 2 (two) times daily as needed for moderate pain. Patient not taking: Reported on 03/25/2017 02/14/17   Bo Merino, MD     Allergies:     Allergies  Allergen Reactions  . Effexor [Venlafaxine] Other (See Comments)    Not effective  . Nortriptyline Other (See Comments)    Not effective  . Paxil [Paroxetine Hcl] Other (See Comments)    Not effective  . Robaxin [Methocarbamol] Itching  . Tizanidine Itching  . Zoloft [Sertraline Hcl] Other (See Comments)    Not effective     Physical Exam:   Vitals  Blood pressure (!) 109/48, pulse 68, temperature 98.6 F (37 C), temperature source Axillary, resp. rate 16, height 5\' 5"  (1.651 m), weight 76.4 kg (168 lb 6.9 oz), SpO2 90 %.   1. General lying in bed in NAD,    2. Normal affect and  insight, Not Suicidal or Homicidal, Awake Alert, Oriented X 3.  3. No F.N deficits, ALL C.Nerves Intact, Strength 5/5 all 4 extremities, Sensation intact all 4 extremities,  Plantars down going.  4. Ears and Eyes appear Normal, Conjunctivae clear, PERRLA. Moist Oral Mucosa.  5. Supple Neck, No JVD, No cervical lymphadenopathy appriciated, No Carotid Bruits.  6. Symmetrical Chest wall movement, Good air movement bilaterally,  Slight crackle left lung base, no wheezing  7. RRR, No Gallops, Rubs or Murmurs, No Parasternal Heave.  8. Positive Bowel Sounds, Abdomen Soft, No tenderness, No organomegaly appriciated,No rebound -guarding or rigidity.  9.  No Cyanosis, Normal Skin Turgor, No Skin Rash or Bruise.  10. Good muscle tone,  joints appear normal , no effusions, Normal ROM.  11. No Palpable Lymph Nodes in Neck or Axillae    Data Review:    CBC  Recent Labs Lab 03/25/17 1831  WBC 18.2*  HGB 12.8  HCT 38.9  PLT 228  MCV 96.5  MCH 31.8  MCHC 32.9  RDW 13.5  LYMPHSABS 1.7  MONOABS 1.5*  EOSABS 0.0  BASOSABS 0.0   ------------------------------------------------------------------------------------------------------------------  Chemistries   Recent Labs Lab 03/25/17 1831  NA 136  K 4.9  CL 94*  CO2 31  GLUCOSE 108*  BUN 26*  CREATININE 1.06*  CALCIUM 8.5*  AST 23  ALT 18  ALKPHOS 47  BILITOT 1.8*   ------------------------------------------------------------------------------------------------------------------ estimated creatinine clearance is 44.7 mL/min (A) (by C-G formula based on SCr of 1.06 mg/dL (H)). ------------------------------------------------------------------------------------------------------------------ No results for input(s): TSH, T4TOTAL, T3FREE, THYROIDAB in the last 72 hours.  Invalid input(s): FREET3  Coagulation profile No results for input(s): INR, PROTIME in the last 168  hours. ------------------------------------------------------------------------------------------------------------------- No results for input(s): DDIMER in the last 72 hours. -------------------------------------------------------------------------------------------------------------------  Cardiac Enzymes No results for input(s): CKMB, TROPONINI, MYOGLOBIN in the last 168 hours.  Invalid input(s): CK ------------------------------------------------------------------------------------------------------------------ No results found for: BNP   ---------------------------------------------------------------------------------------------------------------  Urinalysis    Component Value Date/Time   COLORURINE YELLOW 03/25/2017 [place] 03/25/2017 1831   LABSPEC 1.011 03/25/2017 1831   PHURINE 7.0 03/25/2017 1831   GLUCOSEU NEGATIVE 03/25/2017 1831   HGBUR NEGATIVE 03/25/2017 1831   BILIRUBINUR NEGATIVE 03/25/2017 1831   KETONESUR NEGATIVE 03/25/2017 1831   PROTEINUR NEGATIVE 03/25/2017 1831   UROBILINOGEN 1.0 10/31/2014 0238   NITRITE NEGATIVE 03/25/2017 1831   LEUKOCYTESUR MODERATE (A) 03/25/2017 1831    ----------------------------------------------------------------------------------------------------------------   Imaging Results:    Dg Chest 2 View  Result Date: 03/25/2017 CLINICAL DATA:  Fever. EXAM: CHEST  2 VIEW COMPARISON:  Chest x-ray dated October 03, 2015. FINDINGS: The cardiomediastinal silhouette is borderline enlarged, similar to prior study. Atherosclerotic calcification of the aortic arch. Left lower lobe consolidation with adjacent small pleural effusion. Right basilar atelectasis. No pneumothorax. No acute osseous abnormality. IMPRESSION: Left lower lobe pneumonia with adjacent small pleural effusion. Electronically Signed   By: Titus Dubin M.D.   On: 03/25/2017 19:04      Assessment & Plan:    Principal Problem:   CAP (community  acquired pneumonia) Active Problems:   Hyperlipidemia   Hypertension   Diabetes mellitus type 2, controlled ([place])   UTI (urinary tract infection)    CAP Blood culture x2 Sputum gramstain, culture Urine legionella antigen, urine strep antigen Rocephin 1gm iv qday, zithromax 500mg  iv qday  Uti Await urine culture Rocephin 1gm iv qday  AMS likely due to uti Check CT brain  Dm2 Continue metformin fsbs ac and qhs, ISS  Hypotension Trop I q6hx3 Check cardiac echo Ns iv Check cortisol  Hypertension Cont bisoprolol  Hyperlipidemia Cont lipitor  Anxiety Cont xanax  Gerd Cont PPI  DVT Prophylaxis Lovenox - SCDs  AM Labs Ordered, also please review Full Orders  Family Communication: Admission, patients condition and plan of care including tests being ordered have been discussed with the patient  who indicate understanding and agree with the plan and Code Status.  Code Status FULL CODE  Likely DC to  home  Condition GUARDED  Consults called:  none  Admission status: inpatient  Time spent in minutes : 45 minutes   Jani Gravel M.D on 03/25/2017 at 11:40 PM  Between 7am to 7pm - Pager - [phone]  . After 7pm go to [email or web address] - password [facility]  [facility] - Office  [phone]

## 2017-03-25 NOTE — ED Triage Notes (Addendum)
BIB EMS: Daughter came to visit pt today. Pt presented to daughter as disoriented, confused. EMS reports N/V last night, poor skin turgor. Reports burning with urination, urinary frequency. Hx of incontinence issues and wears depends. Dizzy, weak, nausea upon standing. 500 mL NS and 4 mg zofran given en route CBG 130 HR 74 117/59 O2 96% 4 L/min (86% room air)

## 2017-03-26 ENCOUNTER — Inpatient Hospital Stay (HOSPITAL_COMMUNITY): Payer: Medicare Other

## 2017-03-26 DIAGNOSIS — R0902 Hypoxemia: Secondary | ICD-10-CM

## 2017-03-26 DIAGNOSIS — E1122 Type 2 diabetes mellitus with diabetic chronic kidney disease: Secondary | ICD-10-CM

## 2017-03-26 DIAGNOSIS — E1121 Type 2 diabetes mellitus with diabetic nephropathy: Secondary | ICD-10-CM

## 2017-03-26 DIAGNOSIS — N342 Other urethritis: Secondary | ICD-10-CM

## 2017-03-26 DIAGNOSIS — I340 Nonrheumatic mitral (valve) insufficiency: Secondary | ICD-10-CM

## 2017-03-26 DIAGNOSIS — A419 Sepsis, unspecified organism: Principal | ICD-10-CM

## 2017-03-26 DIAGNOSIS — N182 Chronic kidney disease, stage 2 (mild): Secondary | ICD-10-CM

## 2017-03-26 DIAGNOSIS — I10 Essential (primary) hypertension: Secondary | ICD-10-CM

## 2017-03-26 LAB — COMPREHENSIVE METABOLIC PANEL
ALT: 17 U/L (ref 14–54)
AST: 15 U/L (ref 15–41)
Albumin: 2.9 g/dL — ABNORMAL LOW (ref 3.5–5.0)
Alkaline Phosphatase: 42 U/L (ref 38–126)
Anion gap: 5 (ref 5–15)
BUN: 17 mg/dL (ref 6–20)
CO2: 28 mmol/L (ref 22–32)
Calcium: 8 mg/dL — ABNORMAL LOW (ref 8.9–10.3)
Chloride: 109 mmol/L (ref 101–111)
Creatinine, Ser: 0.7 mg/dL (ref 0.44–1.00)
GFR calc Af Amer: >60 mL/min (ref >60)
GFR calc non Af Amer: >60 mL/min (ref >60)
Glucose, Bld: 95 mg/dL (ref 65–99)
Potassium: 4.3 mmol/L (ref 3.5–5.1)
Sodium: 142 mmol/L (ref 135–145)
Total Bilirubin: 0.9 mg/dL (ref 0.3–1.2)
Total Protein: 5.6 g/dL — ABNORMAL LOW (ref 6.5–8.1)

## 2017-03-26 LAB — CBC
HCT: 34.5 % — ABNORMAL LOW (ref 36.0–46.0)
Hemoglobin: 11 g/dL — ABNORMAL LOW (ref 12.0–15.0)
MCH: 31.3 pg (ref 26.0–34.0)
MCHC: 31.9 g/dL (ref 30.0–36.0)
MCV: 98.3 fL (ref 78.0–100.0)
Platelets: 205 10*3/uL (ref 150–400)
RBC: 3.51 MIL/uL — ABNORMAL LOW (ref 3.87–5.11)
RDW: 13.8 % (ref 11.5–15.5)
WBC: 16.9 10*3/uL — ABNORMAL HIGH (ref 4.0–10.5)

## 2017-03-26 LAB — ECHOCARDIOGRAM COMPLETE
E decel time: 218 msec
E/e' ratio: 10.59
FS: 29 % (ref 28–44)
Height: 65 in
IVS/LV PW RATIO, ED: 0.95
LA ID, A-P, ES: 40 mm
LA diam end sys: 40 mm
LA diam index: 2.17 cm/m2
LA vol A4C: 33.9 ml
LA vol index: 26.1 mL/m2
LA vol: 48.1 mL
LV E/e' medial: 10.59
LV E/e'average: 10.59
LV PW d: 11 mm — AB (ref 0.6–1.1)
LV e' LATERAL: 10.1 cm/s
LVOT SV: 76 mL
LVOT VTI: 24.3 cm
LVOT area: 3.14 cm2
LVOT diameter: 20 mm
LVOT peak vel: 106 cm/s
Lateral S' vel: 12.4 cm/s
MV Dec: 218
MV Peak grad: 5 mmHg
MV pk A vel: 110 m/s
MV pk E vel: 107 m/s
RV sys press: 36 mmHg
Reg peak vel: 288 cm/s
TAPSE: 25.2 mm
TDI e' lateral: 10.1
TDI e' medial: 5.44
TR max vel: 288 cm/s
Weight: 2694.9 oz

## 2017-03-26 LAB — RESPIRATORY PANEL BY PCR

## 2017-03-26 LAB — HEMOGLOBIN A1C
Hgb A1c MFr Bld: 5.7 % — ABNORMAL HIGH (ref 4.8–5.6)
Mean Plasma Glucose: 116.89 mg/dL

## 2017-03-26 LAB — STREP PNEUMONIAE URINARY ANTIGEN: Strep Pneumo Urinary Antigen: NEGATIVE

## 2017-03-26 LAB — GLUCOSE, CAPILLARY
Glucose-Capillary: 117 mg/dL — ABNORMAL HIGH (ref 65–99)
Glucose-Capillary: 89 mg/dL (ref 65–99)
Glucose-Capillary: 91 mg/dL (ref 65–99)
Glucose-Capillary: 83 mg/dL (ref 65–99)
Glucose-Capillary: 125 mg/dL — ABNORMAL HIGH (ref 65–99)

## 2017-03-26 LAB — CORTISOL
Cortisol, Plasma: 8.4 ug/dL
Cortisol, Plasma: 4 ug/dL

## 2017-03-26 LAB — MAGNESIUM: Magnesium: 1.9 mg/dL (ref 1.7–2.4)

## 2017-03-26 LAB — MRSA PCR SCREENING: MRSA by PCR: NEGATIVE

## 2017-03-26 LAB — TROPONIN I
Troponin I: <0.03 ng/mL (ref <0.03)
Troponin I: <0.03 ng/mL (ref <0.03)

## 2017-03-26 LAB — HIV ANTIBODY (ROUTINE TESTING W REFLEX): HIV Screen 4th Generation wRfx: NONREACTIVE

## 2017-03-26 MED ORDER — ATORVASTATIN CALCIUM 40 MG PO TABS
80.0000 mg | ORAL_TABLET | Freq: Every day | ORAL | Status: DC
  Administered 2017-03-26 – 2017-03-28 (×3): 80 mg via ORAL
  Filled 2017-03-26 (×4): qty 2

## 2017-03-26 MED ORDER — TRIAMCINOLONE ACETONIDE 55 MCG/ACT NA AERO
2.0000 | INHALATION_SPRAY | Freq: Every day | NASAL | Status: DC | PRN
  Filled 2017-03-26: qty 21.6

## 2017-03-26 MED ORDER — GABAPENTIN 300 MG PO CAPS
600.0000 mg | ORAL_CAPSULE | Freq: Every day | ORAL | Status: DC
  Administered 2017-03-26 (×2): 600 mg via ORAL
  Filled 2017-03-26 (×2): qty 2

## 2017-03-26 MED ORDER — MONTELUKAST SODIUM 10 MG PO TABS
10.0000 mg | ORAL_TABLET | Freq: Every day | ORAL | Status: DC
  Administered 2017-03-26 – 2017-03-28 (×4): 10 mg via ORAL
  Filled 2017-03-26 (×4): qty 1

## 2017-03-26 MED ORDER — LACTATED RINGERS IV BOLUS (SEPSIS)
500.0000 mL | Freq: Once | INTRAVENOUS | Status: AC
  Administered 2017-03-26: 500 mL via INTRAVENOUS

## 2017-03-26 MED ORDER — ENOXAPARIN SODIUM 40 MG/0.4ML ~~LOC~~ SOLN
40.0000 mg | Freq: Every day | SUBCUTANEOUS | Status: DC
  Administered 2017-03-26 – 2017-03-28 (×4): 40 mg via SUBCUTANEOUS
  Filled 2017-03-26 (×4): qty 0.4

## 2017-03-26 MED ORDER — ALPRAZOLAM 1 MG PO TABS
1.0000 mg | ORAL_TABLET | Freq: Three times a day (TID) | ORAL | Status: DC | PRN
  Filled 2017-03-26: qty 1

## 2017-03-26 MED ORDER — METFORMIN HCL ER 500 MG PO TB24
500.0000 mg | ORAL_TABLET | Freq: Every day | ORAL | Status: DC
  Filled 2017-03-26: qty 1

## 2017-03-26 MED ORDER — FLUOXETINE HCL 20 MG PO CAPS
40.0000 mg | ORAL_CAPSULE | Freq: Every day | ORAL | Status: DC
  Administered 2017-03-26 – 2017-03-29 (×4): 40 mg via ORAL
  Filled 2017-03-26 (×4): qty 2

## 2017-03-26 MED ORDER — FLUTICASONE PROPIONATE 50 MCG/ACT NA SUSP
2.0000 | Freq: Every day | NASAL | Status: DC
  Administered 2017-03-28 – 2017-03-29 (×2): 2 via NASAL
  Filled 2017-03-26: qty 16

## 2017-03-26 MED ORDER — OXYBUTYNIN CHLORIDE 5 MG PO TABS: 5.0000 mg | ORAL_TABLET | Freq: Two times a day (BID) | ORAL | Status: DC | PRN

## 2017-03-26 MED ORDER — INSULIN ASPART 100 UNIT/ML ~~LOC~~ SOLN
0.0000 [IU] | Freq: Three times a day (TID) | SUBCUTANEOUS | Status: DC
  Administered 2017-03-26: 1 [IU] via SUBCUTANEOUS
  Administered 2017-03-27: 2 [IU] via SUBCUTANEOUS
  Administered 2017-03-27: 1 [IU] via SUBCUTANEOUS

## 2017-03-26 MED ORDER — METRONIDAZOLE 500 MG PO TABS
500.0000 mg | ORAL_TABLET | Freq: Three times a day (TID) | ORAL | Status: DC
  Administered 2017-03-26 – 2017-03-29 (×10): 500 mg via ORAL
  Filled 2017-03-26 (×10): qty 1

## 2017-03-26 MED ORDER — BISOPROLOL FUMARATE 5 MG PO TABS
5.0000 mg | ORAL_TABLET | Freq: Every day | ORAL | Status: DC
  Administered 2017-03-26 – 2017-03-29 (×4): 5 mg via ORAL
  Filled 2017-03-26 (×4): qty 1

## 2017-03-26 MED ORDER — SENNA 8.6 MG PO TABS
1.0000 | ORAL_TABLET | Freq: Every day | ORAL | Status: DC | PRN
  Administered 2017-03-27: 8.6 mg via ORAL
  Filled 2017-03-26: qty 1

## 2017-03-26 MED ORDER — PANTOPRAZOLE SODIUM 40 MG PO TBEC
40.0000 mg | DELAYED_RELEASE_TABLET | Freq: Every day | ORAL | Status: DC
  Administered 2017-03-26 – 2017-03-29 (×4): 40 mg via ORAL
  Filled 2017-03-26 (×4): qty 1

## 2017-03-26 MED ORDER — LACTATED RINGERS IV SOLN
INTRAVENOUS | Status: DC
  Administered 2017-03-26 – 2017-03-27 (×2): via INTRAVENOUS

## 2017-03-26 MED ORDER — ONDANSETRON HCL 4 MG PO TABS
4.0000 mg | ORAL_TABLET | Freq: Three times a day (TID) | ORAL | Status: DC | PRN
  Administered 2017-03-26 – 2017-03-27 (×2): 4 mg via ORAL
  Filled 2017-03-26 (×2): qty 1

## 2017-03-26 MED ORDER — ADULT MULTIVITAMIN W/MINERALS CH
1.0000 | ORAL_TABLET | Freq: Every day | ORAL | Status: DC
  Administered 2017-03-26 – 2017-03-29 (×4): 1 via ORAL
  Filled 2017-03-26 (×4): qty 1

## 2017-03-26 MED ORDER — BISACODYL 5 MG PO TBEC: 5.0000 mg | DELAYED_RELEASE_TABLET | Freq: Every day | ORAL | Status: DC | PRN

## 2017-03-26 MED ORDER — SODIUM CHLORIDE 0.9 % IV SOLN
1000.0000 mL | INTRAVENOUS | Status: DC
  Administered 2017-03-26: 1000 mL via INTRAVENOUS

## 2017-03-26 MED ORDER — MAGNESIUM CITRATE PO SOLN
1.0000 | Freq: Once | ORAL | Status: AC | PRN
  Administered 2017-03-27: 1 via ORAL
  Filled 2017-03-26: qty 296

## 2017-03-26 MED ORDER — ALPRAZOLAM 0.5 MG PO TABS
0.5000 mg | ORAL_TABLET | Freq: Two times a day (BID) | ORAL | Status: DC | PRN
  Administered 2017-03-26 – 2017-03-28 (×3): 0.5 mg via ORAL
  Filled 2017-03-26 (×3): qty 1

## 2017-03-26 MED ORDER — SODIUM CHLORIDE 0.9 % IV BOLUS (SEPSIS)
500.0000 mL | Freq: Once | INTRAVENOUS | Status: AC
  Administered 2017-03-26: 500 mL via INTRAVENOUS

## 2017-03-26 MED ORDER — INSULIN ASPART 100 UNIT/ML ~~LOC~~ SOLN
0.0000 [IU] | Freq: Every day | SUBCUTANEOUS | Status: DC
  Administered 2017-03-26: 3 [IU] via SUBCUTANEOUS

## 2017-03-26 MED ORDER — HYDROCORTISONE NA SUCCINATE PF 100 MG IJ SOLR
100.0000 mg | Freq: Three times a day (TID) | INTRAMUSCULAR | Status: DC
Start: 2017-03-26 — End: 2017-03-27
  Administered 2017-03-26 – 2017-03-27 (×3): 100 mg via INTRAVENOUS
  Filled 2017-03-26 (×3): qty 2

## 2017-03-26 MED ORDER — INFLUENZA VAC SPLIT HIGH-DOSE 0.5 ML IM SUSY
0.5000 mL | PREFILLED_SYRINGE | INTRAMUSCULAR | Status: DC
  Filled 2017-03-26: qty 0.5

## 2017-03-26 MED ORDER — ASPIRIN EC 81 MG PO TBEC
81.0000 mg | DELAYED_RELEASE_TABLET | Freq: Every day | ORAL | Status: DC
  Administered 2017-03-26 – 2017-03-29 (×4): 81 mg via ORAL
  Filled 2017-03-26 (×4): qty 1

## 2017-03-26 NOTE — Progress Notes (Signed)
Pt hypotensive, BP 88/40 (58) HR 67, pt lethargic upon  assessment, A/O x1, On call MD notified.  MD ordered  NS bolus 500 ml. Will continue to monitor pt.

## 2017-03-26 NOTE — Evaluation (Signed)
Clinical/Bedside Swallow Evaluation Patient Details  Name: Debra Short MRN: [id number] Date of Birth: 03-16-1941  Today's Date: 03/26/2017 Time: SLP Start Time (ACUTE ONLY): 1455 SLP Stop Time (ACUTE ONLY): 1513 SLP Time Calculation (min) (ACUTE ONLY): 18 min  Past Medical History:  Past Medical History:  Diagnosis Date  . Fibromyalgia   . GERD (gastroesophageal reflux disease)   . Hyperlipidemia   . Hypertension   . IBS (irritable bowel syndrome)   . 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 Vitamin D deficiency    Past Surgical History:  Past Surgical History:  Procedure Laterality Date  . ABDOMINAL HYSTERECTOMY    . ORIF ANKLE FRACTURE Left 09/26/2014  . ORIF ANKLE FRACTURE Left 09/26/2014   Procedure: OPEN REDUCTION INTERNAL FIXATION (ORIF) ANKLE FRACTURE;  Surgeon: [name], MD;  Location: [facility];  Service: Orthopedics;  Laterality: Left;  . TONSILLECTOMY     HPI:  [name] a 76 y.o.female,w hypertension, hyperlipidemia, large cell lymphoma, gerd, who apparently presented due to n/v, and fever, and altered mental status per her daughter. Pt had 100.8 temp per daughter and seemed slightly confused and thus brought her to the hospital.   In ED. Pt admitted to dysuria, and slight dry cough as well as fever. Pt denies cp, palp, abd pain, diarrhea, brbpr, black stool.   CXR =>left lower lobe pneumonia with adjacent small pleural effusion.   Pt will be admitted for AMS secondary to uti, and left lower lung pneumonia.  CT head is negative for acute findings.     Assessment / Plan / Recommendation Clinical Impression  Clinical swallowing evaluation was completed using thin liquids, pureed material and dry solids in setting for admission related to N/V and AMS.  Patient is also currently on droplet precautions pending results of flu swab.  Oral motor exam was completed and unremarkable.  The patient complains of symptoms most consistent with esophageal dysphagia characterized by  material not wanting to go down and intermittent regurgitation of foods.  The patient's oral and pharyngeal swallow appeared to be functional.  Swallow trigger appeared to be timely and hyo-laryngeal excursion was appreciated to palpation.  Mastication of dry solids appeared to be functional.  No oral residue was noted.  Overt s/s of aspiration were not seen.  In addition, the patient passed the [facility] Swallowing Protocol suggesting the low likelihood for aspiration.  Recommend a regular diet with thin liquids.  ST will follow up for therapeutic diet tolerance given the patient's complaints and chest xray showing LLL consolidation.     SLP Visit Diagnosis: Dysphagia, unspecified (R13.10)    Aspiration Risk  Mild aspiration risk    Diet Recommendation   Regular with thin liquids.    Medication Administration: Whole meds with liquid    Other  Recommendations Oral Care Recommendations: Oral care BID   Follow up Recommendations Other (comment) (TBD)      Frequency and Duration min 2x/week  2 weeks           Swallow Study   General Date of Onset: 03/25/17 HPI: [name] a 76 y.o.female,w hypertension, hyperlipidemia, large cell lymphoma, gerd, who apparently presented due to n/v, and fever, and altered mental status per her daughter. Pt had 100.8 temp per daughter and seemed slightly confused and thus brought her to the hospital.   In ED. Pt admitted to dysuria, and slight dry cough as well as fever. Pt denies cp, palp, abd pain, diarrhea, brbpr, black stool.   CXR =>left lower lobe pneumonia  with adjacent small pleural effusion.   Pt will be admitted for AMS secondary to uti, and left lower lung pneumonia.  CT head is negative for acute findings.   Type of Study: Bedside Swallow Evaluation Previous Swallow Assessment: None noted at [facility]. Diet Prior to this Study: Dysphagia 3 (soft);Thin liquids Temperature Spikes Noted: No Respiratory Status: Nasal cannula History of Recent  Intubation: No Behavior/Cognition: Alert;Cooperative Oral Cavity Assessment: Dry Oral Care Completed by SLP: No Oral Cavity - Dentition: Adequate natural dentition Vision: Functional for self-feeding Self-Feeding Abilities: Able to feed self Patient Positioning: Upright in bed Baseline Vocal Quality: Normal Volitional Cough: Strong Volitional Swallow: Able to elicit    Oral/Motor/Sensory Function Overall Oral Motor/Sensory Function: Within functional limits   Ice Chips Ice chips: Not tested   Thin Liquid Thin Liquid: Within functional limits Presentation: Cup;Self Fed;Spoon;Straw    Nectar Thick Nectar Thick Liquid: Not tested   Honey Thick Honey Thick Liquid: Not tested   Puree Puree: Within functional limits Presentation: Spoon   Solid   GO   Solid: Within functional limits Presentation: [place], [place], [facility] SLP [place] 03/26/2017,3:23 PM

## 2017-03-26 NOTE — Progress Notes (Signed)
@IPLOG @        PROGRESS NOTE                                                                                                                                                                                                             Patient Demographics:    Debra Short, is a 76 y.o. female, DOB - [birth year], PPI:[id number]  Admit date - 03/25/2017   Admitting Physician Jani Gravel, MD  Outpatient Primary MD for the patient is Unk [name], MD  LOS - 1  No chief complaint on file.      Brief Narrative    Debra Short  is a 76 y.o. female, w hypertension, hyperlipidemia, large cell lymphoma, gerd, who apparently presented due to n/v, and fever, and altered mental status per her daughter.  Pt had 100.8 temp per daughter and seemed slightly confused and thus brought her to the hospital. In the hospital she was found to have pneumonia likely aspiration with signs of early sepsis and toxic encephalopathy.   Subjective:    Debra Short patient is somnolent, wakes up on verbal stimuli ounces basic questions, No headache, No chest pain, No abdominal pain - No Nausea, No new weakness tingling or numbness, No Cough - SOB.    Assessment  & Plan :     1. Early sepsis due to pneumonia which likely is aspiration from nausea vomiting, UTI toxic encephalopathy. 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 At this time she has been started on IV hydrocortisone, have placed her on IV Rocephin, azithromycin along with oral Flagyl. Monitor cultures. For now soft diet with speech therapy to evaluate for safe swallowing. Continue to monitor and stepdown for the next few days.  2. Dyslipidemia. Continue statin.  3. Hypertension. Currently blood pressure stop. Hold Bystolic if SBP less than 100. Echo ordered upon admission due to hypotension will  monitor results.  4. Toxic encephalopathy due to #1 above. Minimize narcotics and benzodiazepines, treat underlying problem is #1 above. Head CT nonacute. No focal deficits.  5.UTI. Rocephin should suffice follow cultures.  6. GERD. On PPI.  7. DM type II. For now sliding scale only.  CBG (last 3)   Recent Labs  03/26/17 0534 03/26/17 0744 03/26/17 1142  GLUCAP 89 91 83       Diet : Diet heart healthy/carb modified Room service appropriate? Yes; Fluid consistency: Thin  Family Communication  :  None present detailed message left for sun on his cell phone on 03/26/2017 11 AM  Code Status :  Full  Disposition Plan  :  Stepdown  Consults  :  None  Procedures  :    CT head - non acute  TTE   DVT Prophylaxis  :  Lovenox    Lab Results  Component Value Date   PLT 205 03/26/2017    Inpatient Medications  Scheduled Meds: . aspirin EC  81 mg Oral Daily  . atorvastatin  80 mg Oral q1800  . bisoprolol  5 mg Oral Daily  . enoxaparin (LOVENOX) injection  40 mg Subcutaneous QHS  . FLUoxetine  40 mg Oral Daily  . fluticasone  2 spray Each Nare Daily  . gabapentin  600 mg Oral QHS  . hydrocortisone sod succinate (SOLU-CORTEF) inj  100 mg Intravenous Q8H  . [START ON 03/27/2017] Influenza vac split quadrivalent PF  0.5 mL Intramuscular Tomorrow-1000  . insulin aspart  0-5 Units Subcutaneous QHS  . insulin aspart  0-9 Units Subcutaneous TID WC  . montelukast  10 mg Oral QHS  . multivitamin with minerals  1 tablet Oral Daily  . pantoprazole  40 mg Oral Daily   Continuous Infusions: . azithromycin    . cefTRIAXone (ROCEPHIN)  IV    . lactated ringers 75 mL/hr at 03/26/17 0945   PRN Meds:.ALPRAZolam, ondansetron, oxybutynin, triamcinolone  Antibiotics  :    Anti-infectives    Start     Dose/Rate Route Frequency Ordered Stop   03/26/17 2000  azithromycin (ZITHROMAX) 500 mg in dextrose 5 % 250 mL IVPB     500 mg 250 mL/hr over 60 Minutes Intravenous Every 24  hours 03/25/17 1927 04/01/17 1959   03/26/17 2000  cefTRIAXone (ROCEPHIN) 1 g in dextrose 5 % 50 mL IVPB     1 g 100 mL/hr over 30 Minutes Intravenous Every 24 hours 03/25/17 1927 04/01/17 1959   03/25/17 1930  cefTRIAXone (ROCEPHIN) 1 g in dextrose 5 % 50 mL IVPB     1 g 100 mL/hr over 30 Minutes Intravenous  Once 03/25/17 1923 03/25/17 2014   03/25/17 1930  azithromycin (ZITHROMAX) 500 mg in dextrose 5 % 250 mL IVPB     500 mg 250 mL/hr over 60 Minutes Intravenous  Once 03/25/17 1923 03/25/17 2123         Objective:   Vitals:   03/26/17 0800 03/26/17 0900 03/26/17 1000 03/26/17 1100  BP: (!) 98/41 (!) 112/51 (!) 107/49 (!) 111/44  Pulse: 79 74 76 78  Resp: 12 16 11 11   Temp: (!) 97.1 F (36.2 C)     TempSrc: Axillary     SpO2: (!) 89% 100% 100% 98%  Weight:      Height:        Wt Readings from Last 3 Encounters:  03/25/17 76.4 kg (168 lb 6.9 oz)  02/22/17 73 kg (161 lb)  01/12/17 72.7 kg (160 lb 3.2 oz)     Intake/Output Summary (Last 24 hours) at 03/26/17 1159 Last data filed at 03/26/17 1100  Gross per 24 hour  Intake          4873.75 ml  Output                0 ml  Net          4873.75 ml     Physical Exam  Somnolent but easily arousable, moves all 4 extremities to  verbal stimuli, No new F.N deficits, Normal affect [place].AT,PERRAL Supple Neck,No JVD, No cervical lymphadenopathy appriciated.  Symmetrical Chest wall movement, Good air movement bilaterally, CTAB RRR,No Gallops,Rubs or new Murmurs, No Parasternal Heave +ve B.Sounds, Abd Soft, No tenderness, No organomegaly appriciated, No rebound - guarding or rigidity. No Cyanosis, Clubbing or edema, No new Rash or bruise       Data Review:    CBC  Recent Labs Lab 03/25/17 1831 03/26/17 0652  WBC 18.2* 16.9*  HGB 12.8 11.0*  HCT 38.9 34.5*  PLT 228 205  MCV 96.5 98.3  MCH 31.8 31.3  MCHC 32.9 31.9  RDW 13.5 13.8  LYMPHSABS 1.7  --   MONOABS 1.5*  --   EOSABS 0.0  --   BASOSABS 0.0  --      Chemistries   Recent Labs Lab 03/25/17 1831 03/26/17 0652  NA 136 142  K 4.9 4.3  CL 94* 109  CO2 31 28  GLUCOSE 108* 95  BUN 26* 17  CREATININE 1.06* 0.70  CALCIUM 8.5* 8.0*  MG  --  1.9  AST 23 15  ALT 18 17  ALKPHOS 47 42  BILITOT 1.8* 0.9   ------------------------------------------------------------------------------------------------------------------ No results for input(s): CHOL, HDL, LDLCALC, TRIG, CHOLHDL, LDLDIRECT in the last 72 hours.  Lab Results  Component Value Date   HGBA1C 5.7 (H) 03/26/2017   ------------------------------------------------------------------------------------------------------------------ No results for input(s): TSH, T4TOTAL, T3FREE, THYROIDAB in the last 72 hours.  Invalid input(s): FREET3 ------------------------------------------------------------------------------------------------------------------ No results for input(s): VITAMINB12, FOLATE, FERRITIN, TIBC, IRON, RETICCTPCT in the last 72 hours.  Coagulation profile No results for input(s): INR, PROTIME in the last 168 hours.  No results for input(s): DDIMER in the last 72 hours.  Cardiac Enzymes  Recent Labs Lab 03/26/17 0101 03/26/17 0652  TROPONINI <0.03 <0.03   ------------------------------------------------------------------------------------------------------------------ No results found for: BNP  Micro Results Recent Results (from the past 240 hour(s))  MRSA PCR Screening     Status: None   Collection Time: 03/25/17 11:37 PM  Result Value Ref Range Status   MRSA by PCR NEGATIVE NEGATIVE Final    Comment:        The GeneXpert MRSA Assay (FDA approved for NASAL specimens only), is one component of a comprehensive MRSA colonization surveillance program. It is not intended to diagnose MRSA infection nor to guide or monitor treatment for MRSA infections.     Radiology Reports Dg Chest 2 View  Result Date: 03/25/2017 CLINICAL DATA:  Fever.  EXAM: CHEST  2 VIEW COMPARISON:  Chest x-ray dated October 03, 2015. FINDINGS: The cardiomediastinal silhouette is borderline enlarged, similar to prior study. Atherosclerotic calcification of the aortic arch. Left lower lobe consolidation with adjacent small pleural effusion. Right basilar atelectasis. No pneumothorax. No acute osseous abnormality. IMPRESSION: Left lower lobe pneumonia with adjacent small pleural effusion. Electronically Signed   By: Titus Dubin M.D.   On: 03/25/2017 19:04   Ct Head Wo Contrast  Result Date: 03/26/2017 CLINICAL DATA:  Initial evaluation for acute altered mental status, unexplained. EXAM: CT HEAD WITHOUT CONTRAST TECHNIQUE: Contiguous axial images were obtained from the base of the skull through the vertex without intravenous contrast. COMPARISON:  Prior CT from 10/03/2015. FINDINGS: Brain: Mild age-related cerebral atrophy with chronic small vessel ischemic disease. No acute intracranial hemorrhage. No evidence for acute large vessel territory infarct. No mass lesion, midline shift or mass effect. No hydrocephalus. No extra-axial fluid collection. Vascular: No hyperdense vessel. Scattered vascular calcifications noted within the carotid siphons. Skull: Scalp  soft tissues and calvarium within normal limits. Sinuses/Orbits: Globes orbital soft tissues normal. Patient status post lens extraction bilaterally. Paranasal sinuses and mastoid air cells are clear. Other: None. IMPRESSION: 1. No acute intracranial abnormality identified. 2. Mild atrophy with chronic small vessel ischemic disease, stable. Electronically Signed   By: Jeannine Boga M.D.   On: 03/26/2017 06:29   Dg Chest Port 1 View  Result Date: 03/26/2017 CLINICAL DATA:  Shortness of breath and pneumonia. EXAM: PORTABLE CHEST 1 VIEW COMPARISON:  03/25/2017 FINDINGS: The heart size and mediastinal contours are within normal limits. Fairly stable left lower lobe consolidation with possible associated with left  pleural effusion. Opacity at the right lung base may represent additional right lower lobe infiltrate versus atelectasis. No overt edema. No pneumothorax. The visualized skeletal structures are unremarkable. IMPRESSION: Stable left lower lobe consolidation with possible associated left pleural effusion. Right basilar infiltrate versus atelectasis. Electronically Signed   By: Aletta Edouard M.D.   On: 03/26/2017 11:38    Time Spent in minutes  30   Lala Lund M.D on 03/26/2017 at 11:59 AM  Between 7am to 7pm - Pager - [phone] ( page via [email or web address], text pages only, please mention full 10 digit call back number). After 7pm go to [email or web address] - password [email or web address]

## 2017-03-26 NOTE — Progress Notes (Signed)
Upon entering pt room, family was at bedside assisting pt on to bedpan. Educated pt and family on female external catheter that was in place, and encouraged pt to call out if needing assistance. While attempting to use bedpan, linen became soiled with urine. Pt stated that she wanted to finish eating her dinner before changing sheets. Pt and family inquired about wearing briefs. Advised family that briefs are not typically utilized in ICU/SD; Discussed risk of MASD. Advised pt and family that RN would allow pt to finish dinner per her request and then change linen.

## 2017-03-26 NOTE — Progress Notes (Signed)
  Echocardiogram 2D Echocardiogram has been performed.  Debra Short 03/26/2017, 9:43 AM

## 2017-03-27 LAB — CBC
HCT: 33.1 % — ABNORMAL LOW (ref 36.0–46.0)
Hemoglobin: 10.7 g/dL — ABNORMAL LOW (ref 12.0–15.0)
MCH: 31.8 pg (ref 26.0–34.0)
MCHC: 32.3 g/dL (ref 30.0–36.0)
MCV: 98.5 fL (ref 78.0–100.0)
Platelets: 195 10*3/uL (ref 150–400)
RBC: 3.36 MIL/uL — ABNORMAL LOW (ref 3.87–5.11)
RDW: 13.1 % (ref 11.5–15.5)
WBC: 12.3 10*3/uL — ABNORMAL HIGH (ref 4.0–10.5)

## 2017-03-27 LAB — LEGIONELLA PNEUMOPHILA SEROGP 1 UR AG: L. pneumophila Serogp 1 Ur Ag: NEGATIVE

## 2017-03-27 LAB — COMPREHENSIVE METABOLIC PANEL
ALT: 13 U/L — ABNORMAL LOW (ref 14–54)
AST: 14 U/L — ABNORMAL LOW (ref 15–41)
Albumin: 2.9 g/dL — ABNORMAL LOW (ref 3.5–5.0)
Alkaline Phosphatase: 49 U/L (ref 38–126)
Anion gap: 6 (ref 5–15)
BUN: 15 mg/dL (ref 6–20)
CO2: 29 mmol/L (ref 22–32)
Calcium: 8.7 mg/dL — ABNORMAL LOW (ref 8.9–10.3)
Chloride: 110 mmol/L (ref 101–111)
Creatinine, Ser: 0.65 mg/dL (ref 0.44–1.00)
GFR calc Af Amer: >60 mL/min (ref >60)
GFR calc non Af Amer: >60 mL/min (ref >60)
Glucose, Bld: 113 mg/dL — ABNORMAL HIGH (ref 65–99)
Potassium: 4 mmol/L (ref 3.5–5.1)
Sodium: 145 mmol/L (ref 135–145)
Total Bilirubin: 1 mg/dL (ref 0.3–1.2)
Total Protein: 5.8 g/dL — ABNORMAL LOW (ref 6.5–8.1)

## 2017-03-27 LAB — GLUCOSE, CAPILLARY
Glucose-Capillary: 272 mg/dL — ABNORMAL HIGH (ref 65–99)
Glucose-Capillary: 129 mg/dL — ABNORMAL HIGH (ref 65–99)
Glucose-Capillary: 162 mg/dL — ABNORMAL HIGH (ref 65–99)
Glucose-Capillary: 115 mg/dL — ABNORMAL HIGH (ref 65–99)
Glucose-Capillary: 74 mg/dL (ref 65–99)

## 2017-03-27 LAB — URINE CULTURE

## 2017-03-27 LAB — MAGNESIUM: Magnesium: 2 mg/dL (ref 1.7–2.4)

## 2017-03-27 MED ORDER — HYDROCORTISONE NA SUCCINATE PF 100 MG IJ SOLR
50.0000 mg | Freq: Two times a day (BID) | INTRAMUSCULAR | Status: DC
  Administered 2017-03-27: 50 mg via INTRAVENOUS
  Filled 2017-03-27 (×2): qty 2

## 2017-03-27 MED ORDER — GABAPENTIN 100 MG PO CAPS
200.0000 mg | ORAL_CAPSULE | Freq: Every day | ORAL | Status: DC
  Administered 2017-03-27 – 2017-03-28 (×2): 200 mg via ORAL
  Filled 2017-03-27 (×2): qty 2

## 2017-03-27 NOTE — Care Management Note (Signed)
Case Management Note  Patient Details  Name: AZLIN ZILBERMAN MRN: [id number] Date of Birth: [birth year]  Subjective/Objective:                  ams and sepsis  Action/Plan: Date:  October 15,2 018 Chart reviewed for concurrent status and case management needs.  Will continue to follow patient progress.  Discharge Planning: following for needs  Expected discharge date: [id number]  Velva Harman, BSN, [place], [place]   Expected Discharge Date:                  Expected Discharge Plan:  Home/Self Care  In-House Referral:     Discharge planning Services  CM Consult  Post Acute Care Choice:    Choice offered to:     DME Arranged:    DME Agency:     HH Arranged:    HH Agency:     Status of Service:  In process, will continue to follow  If discussed at Long Length of Stay Meetings, dates discussed:    Additional Comments:  Leeroy Cha, RN 03/27/2017, 9:03 AM

## 2017-03-27 NOTE — Evaluation (Signed)
Physical Therapy Evaluation Patient Details Name: Debra Short MRN: [id number] DOB: 14-Jun-1940 Today's Date: 03/27/2017   History of Present Illness  76 yo female admitted with Pna, AMs, UTI. Hx of HTN, lymphoma, L ankle ORIF 2016  Clinical Impression  On eval, pt was Min guard assist for mobility. She walked ~250 feet around hospital unit using a rollator. O2 sat >90% on RA at rest, 88% on RA during ambulation. Mild memory issues noted- pt stated year is 1918 during session. No family present. Discussed d/c plan-pt plans to return home with family/friends assisting as she feels she needs. She refuses SNF placement. Will follow and progress activity as tolerated. Recommend HHPT f/u if pt is agreeable.     Follow Up Recommendations Home health PT;Supervision - Intermittent    Equipment Recommendations  None recommended by PT    Recommendations for Other Services       Precautions / Restrictions Precautions Precautions: Fall Precaution Comments: monitor O2 sats Restrictions Weight Bearing Restrictions: No      Mobility  Bed Mobility               General bed mobility comments: oob in recliner  Transfers Overall transfer level: Needs assistance Equipment used: None;4-wheeled walker Transfers: Sit to/from Stand Sit to Stand: Supervision         General transfer comment: for safety. cues for hand placement  Ambulation/Gait Ambulation/Gait assistance: Min guard Ambulation Distance (Feet): 250 Feet Assistive device: 4-wheeled walker Gait Pattern/deviations: Step-through pattern     General Gait Details: close guard for safety. O2 saT 88% on RA during ambulation.   Stairs            Wheelchair Mobility    Modified Rankin (Stroke Patients Only)       Balance                                             Pertinent Vitals/Pain Pain Assessment: No/denies pain    Home Living Family/patient expects to be discharged to:: Private  residence Living Arrangements: Alone Available Help at Discharge: Family;Available PRN/intermittently;Friend(s) Type of Home: House Home Access: Stairs to enter Entrance Stairs-Rails: None Entrance Stairs-Number of Steps: 2 Home Layout: Two level;Able to live on main level with bedroom/bathroom Home Equipment: Gilford Rile - 2 wheels;Bedside commode;Wheelchair - manual      Prior Function Level of Independence: Independent         Comments: Pt still drives     Hand Dominance        Extremity/Trunk Assessment   Upper Extremity Assessment Upper Extremity Assessment: Overall WFL for tasks assessed    Lower Extremity Assessment Lower Extremity Assessment: Generalized weakness    Cervical / Trunk Assessment Cervical / Trunk Assessment: Normal  Communication   Communication: No difficulties  Cognition Arousal/Alertness: Awake/alert Behavior During Therapy: WFL for tasks assessed/performed Overall Cognitive Status: Impaired/Different from baseline Area of Impairment: Orientation                 Orientation Level: Disoriented to;Time             General Comments: pt stated that year was 14. She stated she has taxes from 1916 that are due to be filed.       General Comments      Exercises     Assessment/Plan    PT Assessment Patient needs continued PT services  PT Problem List Decreased strength;Decreased activity tolerance;Decreased mobility;Decreased balance       PT Treatment Interventions DME instruction;Gait training;Functional mobility training;Therapeutic activities;Therapeutic exercise;Balance training    PT Goals (Current goals can be found in the Care Plan section)  Acute Rehab PT Goals Patient Stated Goal: home. "im not going to rehab" PT Goal Formulation: With patient Time For Goal Achievement: 04/10/17 Potential to Achieve Goals: Good    Frequency Min 3X/week   Barriers to discharge        Co-evaluation                AM-PAC PT "6 Clicks" Daily Activity  Outcome Measure Difficulty turning over in bed (including adjusting bedclothes, sheets and blankets)?: None Difficulty moving from lying on back to sitting on the side of the bed? : None Difficulty sitting down on and standing up from a chair with arms (e.g., wheelchair, bedside commode, etc,.)?: A Little Help needed moving to and from a bed to chair (including a wheelchair)?: A Little Help needed walking in hospital room?: A Little Help needed climbing 3-5 steps with a railing? : A Little 6 Click Score: 20    End of Session Equipment Utilized During Treatment: Gait belt Activity Tolerance: Patient tolerated treatment well Patient left: in chair;with call bell/phone within reach;with chair alarm set   PT Visit Diagnosis: Muscle weakness (generalized) (M62.81);Difficulty in walking, not elsewhere classified (R26.2)    Time: 1137-1205 PT Time Calculation (min) (ACUTE ONLY): 28 min   Charges:   PT Evaluation $PT Eval Low Complexity: 1 Low PT Treatments $Gait Training: 8-22 mins   PT G Codes:          Weston Anna, MPT Pager: [phone]

## 2017-03-27 NOTE — Progress Notes (Signed)
@IPLOG @        PROGRESS NOTE                                                                                                                                                                                                             Patient Demographics:    Debra Short, is a 76 y.o. female, DOB - 1940/07/10, YIR:[id number]  Admit date - 03/25/2017   Admitting Physician Jani Gravel, MD  Outpatient Primary MD for the patient is Unk [name], MD  LOS - 2  No chief complaint on file.      Brief Narrative    Debra Short  is a 76 y.o. female, w hypertension, hyperlipidemia, large cell lymphoma, gerd, who apparently presented due to n/v, and fever, and altered mental status per her daughter.  Pt had 100.8 temp per daughter and seemed slightly confused and thus brought her to the hospital. In the hospital she was found to have pneumonia likely aspiration with signs of early sepsis and toxic encephalopathy.   Subjective:   Patient in bed, appears comfortable, denies any headache, no fever, no chest pain or pressure, no shortness of breath , no abdominal pain. No focal weakness.   Assessment  & Plan :     1. Early sepsis due to pneumonia which likely is aspiration from nausea vomiting, UTI toxic encephalopathy. Sepsis physiology has resolved, she is more awake alert, has been adequately hydrated, continue stress test steroids as blood pressures have responded well to it, continue empiric IV antibiotics. Speech therapy has cleared from aspiration standpoint. Increase activity PT eval.  Move out of stepdown today.  2. Dyslipidemia. On statin continue. 3. Hypertension. Currently blood pressure stop. Hold Bystolic if SBP less than 100. Stable echocardiogram.  4. Toxic encephalopathy due to #1 above. Minimize narcotics and benzodiazepines, treat underlying problem is #1 above. Head CT nonacute. No focal deficits.  5.UTI. On Rocephin , poor specimen for culture.  6. GERD. On PPI.  7.  DM type II. For now sliding scale only.  CBG (last 3)   Recent Labs  03/26/17 1616 03/26/17 2116 03/27/17 0806  GLUCAP 125* 272* 129*       Diet : DIET SOFT Room service appropriate? Yes; Fluid consistency: Thin    Family Communication  :  None present detailed message left for sun on his cell phone on 03/26/2017 11 AM  Code Status :  Full  Disposition Plan  :  MedSurg  Consults  :  None  Procedures  :  CT head - non acute  TTE - Ef 60%, no LVH. The right ventricular systolic pressure was increased consistent   with mild pulmonary hypertension.   DVT Prophylaxis  :  Lovenox    Lab Results  Component Value Date   PLT 195 03/27/2017    Inpatient Medications  Scheduled Meds: . aspirin EC  81 mg Oral Daily  . atorvastatin  80 mg Oral q1800  . bisoprolol  5 mg Oral Daily  . enoxaparin (LOVENOX) injection  40 mg Subcutaneous QHS  . FLUoxetine  40 mg Oral Daily  . fluticasone  2 spray Each Nare Daily  . gabapentin  200 mg Oral QHS  . hydrocortisone sod succinate (SOLU-CORTEF) inj  100 mg Intravenous Q8H  . Influenza vac split quadrivalent PF  0.5 mL Intramuscular Tomorrow-1000  . insulin aspart  0-5 Units Subcutaneous QHS  . insulin aspart  0-9 Units Subcutaneous TID WC  . metroNIDAZOLE  500 mg Oral Q8H  . montelukast  10 mg Oral QHS  . multivitamin with minerals  1 tablet Oral Daily  . pantoprazole  40 mg Oral Daily   Continuous Infusions: . azithromycin Stopped (03/26/17 2100)  . cefTRIAXone (ROCEPHIN)  IV Stopped (03/26/17 2200)   PRN Meds:.ALPRAZolam, bisacodyl, magnesium citrate, ondansetron, oxybutynin, senna, triamcinolone  Antibiotics  :    Anti-infectives    Start     Dose/Rate Route Frequency Ordered Stop   03/26/17 2000  azithromycin (ZITHROMAX) 500 mg in dextrose 5 % 250 mL IVPB     500 mg 250 mL/hr over 60 Minutes Intravenous Every 24 hours 03/25/17 1927 04/01/17 1959   03/26/17 2000  cefTRIAXone (ROCEPHIN) 1 g in dextrose 5 % 50 mL IVPB      1 g 100 mL/hr over 30 Minutes Intravenous Every 24 hours 03/25/17 1927 04/01/17 1959   03/26/17 1400  metroNIDAZOLE (FLAGYL) tablet 500 mg     500 mg Oral Every 8 hours 03/26/17 1203     03/25/17 1930  cefTRIAXone (ROCEPHIN) 1 g in dextrose 5 % 50 mL IVPB     1 g 100 mL/hr over 30 Minutes Intravenous  Once 03/25/17 1923 03/25/17 2014   03/25/17 1930  azithromycin (ZITHROMAX) 500 mg in dextrose 5 % 250 mL IVPB     500 mg 250 mL/hr over 60 Minutes Intravenous  Once 03/25/17 1923 03/25/17 2123         Objective:   Vitals:   03/27/17 0400 03/27/17 0500 03/27/17 0600 03/27/17 0800  BP: (!) 138/50 (!) 151/71 (!) 149/55   Pulse: 67 74 66   Resp: 18 (!) 21 12   Temp:    (!) 97.4 F (36.3 C)  TempSrc:    Oral  SpO2: 99% 96% 98%   Weight:      Height:        Wt Readings from Last 3 Encounters:  03/25/17 76.4 kg (168 lb 6.9 oz)  02/22/17 73 kg (161 lb)  01/12/17 72.7 kg (160 lb 3.2 oz)     Intake/Output Summary (Last 24 hours) at 03/27/17 1153 Last data filed at 03/27/17 0600  Gross per 24 hour  Intake             1500 ml  Output              550 ml  Net              950 ml     Physical Exam  Awake Alert, No new F.N deficits,  Normal affect [place].AT,PERRAL Supple Neck,No JVD, No cervical lymphadenopathy appriciated.  Symmetrical Chest wall movement, Good air movement bilaterally, CTAB RRR,No Gallops,Rubs or new Murmurs, No Parasternal Heave +ve B.Sounds, Abd Soft, No tenderness, No organomegaly appriciated, No rebound - guarding or rigidity. No Cyanosis, Clubbing or edema, No new Rash or bruise    Data Review:    CBC  Recent Labs Lab 03/25/17 1831 03/26/17 0652 03/27/17 0349  WBC 18.2* 16.9* 12.3*  HGB 12.8 11.0* 10.7*  HCT 38.9 34.5* 33.1*  PLT 228 205 195  MCV 96.5 98.3 98.5  MCH 31.8 31.3 31.8  MCHC 32.9 31.9 32.3  RDW 13.5 13.8 13.1  LYMPHSABS 1.7  --   --   MONOABS 1.5*  --   --   EOSABS 0.0  --   --   BASOSABS 0.0  --   --     Chemistries    Recent Labs Lab 03/25/17 1831 03/26/17 0652 03/27/17 0349  NA 136 142 145  K 4.9 4.3 4.0  CL 94* 109 110  CO2 31 28 29   GLUCOSE 108* 95 113*  BUN 26* 17 15  CREATININE 1.06* 0.70 0.65  CALCIUM 8.5* 8.0* 8.7*  MG  --  1.9 2.0  AST 23 15 14*  ALT 18 17 13*  ALKPHOS 47 42 49  BILITOT 1.8* 0.9 1.0   ------------------------------------------------------------------------------------------------------------------ No results for input(s): CHOL, HDL, LDLCALC, TRIG, CHOLHDL, LDLDIRECT in the last 72 hours.  Lab Results  Component Value Date   HGBA1C 5.7 (H) 03/26/2017   ------------------------------------------------------------------------------------------------------------------ No results for input(s): TSH, T4TOTAL, T3FREE, THYROIDAB in the last 72 hours.  Invalid input(s): FREET3 ------------------------------------------------------------------------------------------------------------------ No results for input(s): VITAMINB12, FOLATE, FERRITIN, TIBC, IRON, RETICCTPCT in the last 72 hours.  Coagulation profile No results for input(s): INR, PROTIME in the last 168 hours.  No results for input(s): DDIMER in the last 72 hours.  Cardiac Enzymes  Recent Labs Lab 03/26/17 0101 03/26/17 0652  TROPONINI <0.03 <0.03   ------------------------------------------------------------------------------------------------------------------ No results found for: BNP  Micro Results Recent Results (from the past 240 hour(s))  Blood Culture (routine x 2)     Status: None (Preliminary result)   Collection Time: 03/25/17  6:10 PM  Result Value Ref Range Status   Specimen Description BLOOD BLOOD LEFT FOREARM  Final   Special Requests IN PEDIATRIC BOTTLE Blood Culture adequate volume  Final   Culture   Final    NO GROWTH < 24 HOURS Performed at [facility], [place] [address] [place], [place] [address]    Report Status PENDING  Incomplete  Blood Culture (routine x 2)      Status: None (Preliminary result)   Collection Time: 03/25/17  6:20 PM  Result Value Ref Range Status   Specimen Description BLOOD RIGHT ANTECUBITAL  Final   Special Requests   Final    BOTTLES DRAWN AEROBIC AND ANAEROBIC Blood Culture adequate volume   Culture   Final    NO GROWTH < 24 HOURS Performed at [facility], [place] [address] [place], [place] [address]    Report Status PENDING  Incomplete  Urine culture     Status: Abnormal   Collection Time: 03/25/17  6:33 PM  Result Value Ref Range Status   Specimen Description URINE, CLEAN CATCH  Final   Special Requests NONE  Final   Culture MULTIPLE SPECIES PRESENT, SUGGEST RECOLLECTION (A)  Final   Report Status 03/27/2017 FINAL  Final  MRSA PCR Screening  Status: None   Collection Time: 03/25/17 11:37 PM  Result Value Ref Range Status   MRSA by PCR NEGATIVE NEGATIVE Final    Comment:        The GeneXpert MRSA Assay (FDA approved for NASAL specimens only), is one component of a comprehensive MRSA colonization surveillance program. It is not intended to diagnose MRSA infection nor to guide or monitor treatment for MRSA infections.   Respiratory Panel by PCR     Status: None   Collection Time: 03/26/17  1:56 AM  Result Value Ref Range Status   Adenovirus NOT DETECTED NOT DETECTED Final   Coronavirus 229E NOT DETECTED NOT DETECTED Final   Coronavirus HKU1 NOT DETECTED NOT DETECTED Final   Coronavirus NL63 NOT DETECTED NOT DETECTED Final   Coronavirus OC43 NOT DETECTED NOT DETECTED Final   Metapneumovirus NOT DETECTED NOT DETECTED Final   Rhinovirus / Enterovirus NOT DETECTED NOT DETECTED Final   Influenza A NOT DETECTED NOT DETECTED Final   Influenza B NOT DETECTED NOT DETECTED Final   Parainfluenza Virus 1 NOT DETECTED NOT DETECTED Final   Parainfluenza Virus 2 NOT DETECTED NOT DETECTED Final   Parainfluenza Virus 3 NOT DETECTED NOT DETECTED Final   Parainfluenza Virus 4 NOT DETECTED NOT DETECTED Final    Respiratory Syncytial Virus NOT DETECTED NOT DETECTED Final   Bordetella pertussis NOT DETECTED NOT DETECTED Final   Chlamydophila pneumoniae NOT DETECTED NOT DETECTED Final   Mycoplasma pneumoniae NOT DETECTED NOT DETECTED Final    Comment: Performed at [facility], [place] [address] [place], [place] [address]    Radiology Reports Dg Chest 2 View  Result Date: 03/25/2017 CLINICAL DATA:  Fever. EXAM: CHEST  2 VIEW COMPARISON:  Chest x-ray dated October 03, 2015. FINDINGS: The cardiomediastinal silhouette is borderline enlarged, similar to prior study. Atherosclerotic calcification of the aortic arch. Left lower lobe consolidation with adjacent small pleural effusion. Right basilar atelectasis. No pneumothorax. No acute osseous abnormality. IMPRESSION: Left lower lobe pneumonia with adjacent small pleural effusion. Electronically Signed   By: Titus Dubin M.D.   On: 03/25/2017 19:04   Ct Head Wo Contrast  Result Date: 03/26/2017 CLINICAL DATA:  Initial evaluation for acute altered mental status, unexplained. EXAM: CT HEAD WITHOUT CONTRAST TECHNIQUE: Contiguous axial images were obtained from the base of the skull through the vertex without intravenous contrast. COMPARISON:  Prior CT from 10/03/2015. FINDINGS: Brain: Mild age-related cerebral atrophy with chronic small vessel ischemic disease. No acute intracranial hemorrhage. No evidence for acute large vessel territory infarct. No mass lesion, midline shift or mass effect. No hydrocephalus. No extra-axial fluid collection. Vascular: No hyperdense vessel. Scattered vascular calcifications noted within the carotid siphons. Skull: Scalp soft tissues and calvarium within normal limits. Sinuses/Orbits: Globes orbital soft tissues normal. Patient status post lens extraction bilaterally. Paranasal sinuses and mastoid air cells are clear. Other: None. IMPRESSION: 1. No acute intracranial abnormality identified. 2. Mild atrophy with chronic small vessel  ischemic disease, stable. Electronically Signed   By: Jeannine Boga M.D.   On: 03/26/2017 06:29   Dg Chest Port 1 View  Result Date: 03/26/2017 CLINICAL DATA:  Shortness of breath and pneumonia. EXAM: PORTABLE CHEST 1 VIEW COMPARISON:  03/25/2017 FINDINGS: The heart size and mediastinal contours are within normal limits. Fairly stable left lower lobe consolidation with possible associated with left pleural effusion. Opacity at the right lung base may represent additional right lower lobe infiltrate versus atelectasis. No overt edema. No pneumothorax. The visualized skeletal structures are  unremarkable. IMPRESSION: Stable left lower lobe consolidation with possible associated left pleural effusion. Right basilar infiltrate versus atelectasis. Electronically Signed   By: Aletta Edouard M.D.   On: 03/26/2017 11:38    Time Spent in minutes  30   Lala Lund M.D on 03/27/2017 at 11:53 AM  Between 7am to 7pm - Pager - [phone] ( page via [email or web address], text pages only, please mention full 10 digit call back number). After 7pm go to [email or web address] - password [facility]

## 2017-03-28 LAB — GLUCOSE, CAPILLARY
Glucose-Capillary: 86 mg/dL (ref 65–99)
Glucose-Capillary: 89 mg/dL (ref 65–99)
Glucose-Capillary: 108 mg/dL — ABNORMAL HIGH (ref 65–99)
Glucose-Capillary: 93 mg/dL (ref 65–99)

## 2017-03-28 LAB — COMPREHENSIVE METABOLIC PANEL
ALT: 15 U/L (ref 14–54)
AST: 18 U/L (ref 15–41)
Albumin: 2.8 g/dL — ABNORMAL LOW (ref 3.5–5.0)
Alkaline Phosphatase: 44 U/L (ref 38–126)
Anion gap: 7 (ref 5–15)
BUN: 11 mg/dL (ref 6–20)
CO2: 32 mmol/L (ref 22–32)
Calcium: 8.5 mg/dL — ABNORMAL LOW (ref 8.9–10.3)
Chloride: 103 mmol/L (ref 101–111)
Creatinine, Ser: 0.63 mg/dL (ref 0.44–1.00)
GFR calc Af Amer: >60 mL/min (ref >60)
GFR calc non Af Amer: >60 mL/min (ref >60)
Glucose, Bld: 121 mg/dL — ABNORMAL HIGH (ref 65–99)
Potassium: 3.2 mmol/L — ABNORMAL LOW (ref 3.5–5.1)
Sodium: 142 mmol/L (ref 135–145)
Total Bilirubin: 1.7 mg/dL — ABNORMAL HIGH (ref 0.3–1.2)
Total Protein: 5.7 g/dL — ABNORMAL LOW (ref 6.5–8.1)

## 2017-03-28 LAB — CBC
HCT: 31.1 % — ABNORMAL LOW (ref 36.0–46.0)
Hemoglobin: 10.5 g/dL — ABNORMAL LOW (ref 12.0–15.0)
MCH: 32.2 pg (ref 26.0–34.0)
MCHC: 33.8 g/dL (ref 30.0–36.0)
MCV: 95.4 fL (ref 78.0–100.0)
Platelets: 210 10*3/uL (ref 150–400)
RBC: 3.26 MIL/uL — ABNORMAL LOW (ref 3.87–5.11)
RDW: 12.8 % (ref 11.5–15.5)
WBC: 13.3 10*3/uL — ABNORMAL HIGH (ref 4.0–10.5)

## 2017-03-28 MED ORDER — AZITHROMYCIN 250 MG PO TABS
500.0000 mg | ORAL_TABLET | Freq: Every day | ORAL | Status: DC
  Administered 2017-03-28 – 2017-03-29 (×2): 500 mg via ORAL
  Filled 2017-03-28 (×2): qty 2

## 2017-03-28 MED ORDER — POTASSIUM CHLORIDE CRYS ER 20 MEQ PO TBCR
40.0000 meq | EXTENDED_RELEASE_TABLET | Freq: Four times a day (QID) | ORAL | Status: AC
  Administered 2017-03-28: 40 meq via ORAL
  Filled 2017-03-28: qty 2

## 2017-03-28 NOTE — Progress Notes (Addendum)
@IPLOG @        PROGRESS NOTE                                                                                                                                                                                                             Patient Demographics:    Debra Short, is a 76 y.o. female, DOB - 1941/06/13, PZW:[id number]  Admit date - 03/25/2017   Admitting Physician Jani Gravel, MD  Outpatient Primary MD for the patient is Unk [name], MD  LOS - 3  No chief complaint on file.      Brief Narrative    Debra Short  is a 76 y.o. female, w hypertension, hyperlipidemia, large cell lymphoma, gerd, who apparently presented due to n/v, and fever, and altered mental status per her daughter.  Pt had 100.8 temp per daughter and seemed slightly confused and thus brought her to the hospital. In the hospital she was found to have pneumonia likely aspiration with signs of early sepsis and toxic encephalopathy.   Subjective:   Patient in bed, appears comfortable, denies any headache, no fever, no chest pain or pressure, no shortness of breath , no abdominal pain. No focal weakness.   Assessment  & Plan :     1. Early sepsis due to pneumonia which likely is aspiration from nausea vomiting, UTI with toxic encephalopathy. Clinically much better and mentation course to baseline, continue to monitor cultures which thus far are negative, chances of ongoing aspiration are less however she was somnolent and had vomited prior to this episode of pneumonia hence could have had one isolated symptoms of aspiration, for now continue combination of Rocephin-azithromycin and Flagyl. Cleared by speech. Encouraged to sit up in chair and use flutter valve today. If improves discharge in the next 1-2 days.  2. Dyslipidemia. On statin continue.  3. Hypertension. Continue Bystolic. Stable echocardiogram, initially was hypotensive and needed stress dose steroids which will be tapered off.  4. Toxic  encephalopathy due to #1 above. Minimize narcotics and benzodiazepines, treat underlying problem is #1 above. Head CT nonacute. No focal deficits.today encephalopathy seems to have resolved.  5. UTI. On Rocephin , poor specimen for culture.  6. GERD. On PPI.  7. DM type II. For now sliding scale only.  CBG (last 3)   Recent Labs  03/27/17 1620 03/27/17 2149 03/28/17 0754  GLUCAP 115* 74 86       Diet : DIET SOFT Room service appropriate? Yes; Fluid consistency: Thin    Family Communication  :  None present detailed message left for sun on his cell phone on 03/26/2017 11 AM  Code Status :  Full  Disposition Plan  :  MedSurg  Consults  :  None  Procedures  :    CT head - non acute  TTE - Ef 60%, no LVH. The right ventricular systolic pressure was increased consistent   with mild pulmonary hypertension.   DVT Prophylaxis  :  Lovenox    Lab Results  Component Value Date   PLT 210 03/28/2017    Inpatient Medications  Scheduled Meds: . aspirin EC  81 mg Oral Daily  . atorvastatin  80 mg Oral q1800  . bisoprolol  5 mg Oral Daily  . enoxaparin (LOVENOX) injection  40 mg Subcutaneous QHS  . FLUoxetine  40 mg Oral Daily  . fluticasone  2 spray Each Nare Daily  . gabapentin  200 mg Oral QHS  . hydrocortisone sod succinate (SOLU-CORTEF) inj  50 mg Intravenous Q12H  . Influenza vac split quadrivalent PF  0.5 mL Intramuscular Tomorrow-1000  . insulin aspart  0-5 Units Subcutaneous QHS  . insulin aspart  0-9 Units Subcutaneous TID WC  . metroNIDAZOLE  500 mg Oral Q8H  . montelukast  10 mg Oral QHS  . multivitamin with minerals  1 tablet Oral Daily  . pantoprazole  40 mg Oral Daily  . potassium chloride  40 mEq Oral Q6H   Continuous Infusions: . azithromycin Stopped (03/28/17 0040)  . cefTRIAXone (ROCEPHIN)  IV Stopped (03/28/17 0000)   PRN Meds:.ALPRAZolam, bisacodyl, ondansetron, oxybutynin, senna, triamcinolone  Antibiotics  :    Anti-infectives    Start      Dose/Rate Route Frequency Ordered Stop   03/26/17 2000  azithromycin (ZITHROMAX) 500 mg in dextrose 5 % 250 mL IVPB     500 mg 250 mL/hr over 60 Minutes Intravenous Every 24 hours 03/25/17 1927 04/01/17 1959   03/26/17 2000  cefTRIAXone (ROCEPHIN) 1 g in dextrose 5 % 50 mL IVPB     1 g 100 mL/hr over 30 Minutes Intravenous Every 24 hours 03/25/17 1927 04/01/17 1959   03/26/17 1400  metroNIDAZOLE (FLAGYL) tablet 500 mg     500 mg Oral Every 8 hours 03/26/17 1203     03/25/17 1930  cefTRIAXone (ROCEPHIN) 1 g in dextrose 5 % 50 mL IVPB     1 g 100 mL/hr over 30 Minutes Intravenous  Once 03/25/17 1923 03/25/17 2014   03/25/17 1930  azithromycin (ZITHROMAX) 500 mg in dextrose 5 % 250 mL IVPB     500 mg 250 mL/hr over 60 Minutes Intravenous  Once 03/25/17 1923 03/25/17 2123         Objective:   Vitals:   03/27/17 2325 03/28/17 0001 03/28/17 0039 03/28/17 0651  BP:  122/60  (!) 131/94  Pulse:  82  70  Resp:  18  17  Temp: 99.5 F (37.5 C) (!) 100.5 F (38.1 C) 99.6 F (37.6 C) 99.8 F (37.7 C)  TempSrc: Oral Oral Oral Oral  SpO2:  95%  97%  Weight:      Height:        Wt Readings from Last 3 Encounters:  03/25/17 76.4 kg (168 lb 6.9 oz)  02/22/17 73 kg (161 lb)  01/12/17 72.7 kg (160 lb 3.2 oz)     Intake/Output Summary (Last 24 hours) at 03/28/17 1001 Last data filed at 03/27/17 2255  Gross per 24 hour  Intake  0 ml  Output              300 ml  Net             -300 ml     Physical Exam  Awake Alert, Oriented X 3, No new F.N deficits, Normal affect [place].AT,PERRAL Supple Neck,No JVD, No cervical lymphadenopathy appriciated.  Symmetrical Chest wall movement, Good air movement bilaterally, CTAB RRR,No Gallops,Rubs or new Murmurs, No Parasternal Heave +ve B.Sounds, Abd Soft, No tenderness, No organomegaly appriciated, No rebound - guarding or rigidity. No Cyanosis, Clubbing or edema, No new Rash or bruise     Data Review:    CBC  Recent  Labs Lab 03/25/17 1831 03/26/17 0652 03/27/17 0349 03/28/17 0419  WBC 18.2* 16.9* 12.3* 13.3*  HGB 12.8 11.0* 10.7* 10.5*  HCT 38.9 34.5* 33.1* 31.1*  PLT 228 205 195 210  MCV 96.5 98.3 98.5 95.4  MCH 31.8 31.3 31.8 32.2  MCHC 32.9 31.9 32.3 33.8  RDW 13.5 13.8 13.1 12.8  LYMPHSABS 1.7  --   --   --   MONOABS 1.5*  --   --   --   EOSABS 0.0  --   --   --   BASOSABS 0.0  --   --   --     Chemistries   Recent Labs Lab 03/25/17 1831 03/26/17 0652 03/27/17 0349 03/28/17 0419  NA 136 142 145 142  K 4.9 4.3 4.0 3.2*  CL 94* 109 110 103  CO2 31 28 29  32  GLUCOSE 108* 95 113* 121*  BUN 26* 17 15 11   CREATININE 1.06* 0.70 0.65 0.63  CALCIUM 8.5* 8.0* 8.7* 8.5*  MG  --  1.9 2.0  --   AST 23 15 14* 18  ALT 18 17 13* 15  ALKPHOS 47 42 49 44  BILITOT 1.8* 0.9 1.0 1.7*   ------------------------------------------------------------------------------------------------------------------ No results for input(s): CHOL, HDL, LDLCALC, TRIG, CHOLHDL, LDLDIRECT in the last 72 hours.  Lab Results  Component Value Date   HGBA1C 5.7 (H) 03/26/2017   ------------------------------------------------------------------------------------------------------------------ No results for input(s): TSH, T4TOTAL, T3FREE, THYROIDAB in the last 72 hours.  Invalid input(s): FREET3 ------------------------------------------------------------------------------------------------------------------ No results for input(s): VITAMINB12, FOLATE, FERRITIN, TIBC, IRON, RETICCTPCT in the last 72 hours.  Coagulation profile No results for input(s): INR, PROTIME in the last 168 hours.  No results for input(s): DDIMER in the last 72 hours.  Cardiac Enzymes  Recent Labs Lab 03/26/17 0101 03/26/17 0652  TROPONINI <0.03 <0.03   ------------------------------------------------------------------------------------------------------------------ No results found for: BNP  Micro Results Recent Results (from  the past 240 hour(s))  Blood Culture (routine x 2)     Status: None (Preliminary result)   Collection Time: 03/25/17  6:10 PM  Result Value Ref Range Status   Specimen Description BLOOD BLOOD LEFT FOREARM  Final   Special Requests IN PEDIATRIC BOTTLE Blood Culture adequate volume  Final   Culture   Final    NO GROWTH 2 DAYS Performed at [facility], [place] [address] [place], [place] [address]    Report Status PENDING  Incomplete  Blood Culture (routine x 2)     Status: None (Preliminary result)   Collection Time: 03/25/17  6:20 PM  Result Value Ref Range Status   Specimen Description BLOOD RIGHT ANTECUBITAL  Final   Special Requests   Final    BOTTLES DRAWN AEROBIC AND ANAEROBIC Blood Culture adequate volume   Culture   Final    NO GROWTH  2 DAYS Performed at [facility], [place] [address] [place], [place] [address]    Report Status PENDING  Incomplete  Urine culture     Status: Abnormal   Collection Time: 03/25/17  6:33 PM  Result Value Ref Range Status   Specimen Description URINE, CLEAN CATCH  Final   Special Requests NONE  Final   Culture MULTIPLE SPECIES PRESENT, SUGGEST RECOLLECTION (A)  Final   Report Status 03/27/2017 FINAL  Final  MRSA PCR Screening     Status: None   Collection Time: 03/25/17 11:37 PM  Result Value Ref Range Status   MRSA by PCR NEGATIVE NEGATIVE Final    Comment:        The GeneXpert MRSA Assay (FDA approved for NASAL specimens only), is one component of a comprehensive MRSA colonization surveillance program. It is not intended to diagnose MRSA infection nor to guide or monitor treatment for MRSA infections.   Respiratory Panel by PCR     Status: None   Collection Time: 03/26/17  1:56 AM  Result Value Ref Range Status   Adenovirus NOT DETECTED NOT DETECTED Final   Coronavirus 229E NOT DETECTED NOT DETECTED Final   Coronavirus HKU1 NOT DETECTED NOT DETECTED Final   Coronavirus NL63 NOT DETECTED NOT DETECTED Final   Coronavirus  OC43 NOT DETECTED NOT DETECTED Final   Metapneumovirus NOT DETECTED NOT DETECTED Final   Rhinovirus / Enterovirus NOT DETECTED NOT DETECTED Final   Influenza A NOT DETECTED NOT DETECTED Final   Influenza B NOT DETECTED NOT DETECTED Final   Parainfluenza Virus 1 NOT DETECTED NOT DETECTED Final   Parainfluenza Virus 2 NOT DETECTED NOT DETECTED Final   Parainfluenza Virus 3 NOT DETECTED NOT DETECTED Final   Parainfluenza Virus 4 NOT DETECTED NOT DETECTED Final   Respiratory Syncytial Virus NOT DETECTED NOT DETECTED Final   Bordetella pertussis NOT DETECTED NOT DETECTED Final   Chlamydophila pneumoniae NOT DETECTED NOT DETECTED Final   Mycoplasma pneumoniae NOT DETECTED NOT DETECTED Final    Comment: Performed at [facility], [place] [address], [place], [place] [address]    Radiology Reports Dg Chest 2 View  Result Date: 03/25/2017 CLINICAL DATA:  Fever. EXAM: CHEST  2 VIEW COMPARISON:  Chest x-ray dated October 03, 2015. FINDINGS: The cardiomediastinal silhouette is borderline enlarged, similar to prior study. Atherosclerotic calcification of the aortic arch. Left lower lobe consolidation with adjacent small pleural effusion. Right basilar atelectasis. No pneumothorax. No acute osseous abnormality. IMPRESSION: Left lower lobe pneumonia with adjacent small pleural effusion. Electronically Signed   By: Titus Dubin M.D.   On: 03/25/2017 19:04   Ct Head Wo Contrast  Result Date: 03/26/2017 CLINICAL DATA:  Initial evaluation for acute altered mental status, unexplained. EXAM: CT HEAD WITHOUT CONTRAST TECHNIQUE: Contiguous axial images were obtained from the base of the skull through the vertex without intravenous contrast. COMPARISON:  Prior CT from 10/03/2015. FINDINGS: Brain: Mild age-related cerebral atrophy with chronic small vessel ischemic disease. No acute intracranial hemorrhage. No evidence for acute large vessel territory infarct. No mass lesion, midline shift or mass effect. No  hydrocephalus. No extra-axial fluid collection. Vascular: No hyperdense vessel. Scattered vascular calcifications noted within the carotid siphons. Skull: Scalp soft tissues and calvarium within normal limits. Sinuses/Orbits: Globes orbital soft tissues normal. Patient status post lens extraction bilaterally. Paranasal sinuses and mastoid air cells are clear. Other: None. IMPRESSION: 1. No acute intracranial abnormality identified. 2. Mild atrophy with chronic small vessel ischemic disease, stable. Electronically  Signed   By: Jeannine Boga M.D.   On: 03/26/2017 06:29   Dg Chest Port 1 View  Result Date: 03/26/2017 CLINICAL DATA:  Shortness of breath and pneumonia. EXAM: PORTABLE CHEST 1 VIEW COMPARISON:  03/25/2017 FINDINGS: The heart size and mediastinal contours are within normal limits. Fairly stable left lower lobe consolidation with possible associated with left pleural effusion. Opacity at the right lung base may represent additional right lower lobe infiltrate versus atelectasis. No overt edema. No pneumothorax. The visualized skeletal structures are unremarkable. IMPRESSION: Stable left lower lobe consolidation with possible associated left pleural effusion. Right basilar infiltrate versus atelectasis. Electronically Signed   By: Aletta Edouard M.D.   On: 03/26/2017 11:38    Time Spent in minutes  30   Lala Lund M.D on 03/28/2017 at 10:01 AM  Between 7am to 7pm - Pager - [phone] ( page via [email or web address], text pages only, please mention full 10 digit call back number). After 7pm go to [email or web address] - password [email or web address]

## 2017-03-28 NOTE — Care Management Important Message (Signed)
Important Message  Patient Details  Name: Debra Short MRN: [id number] Date of Birth: 03-12-1941   Medicare Important Message Given:  Yes    Kerin Salen 03/28/2017, 12:26 [place] Message  Patient Details  Name: Debra Short MRN: [id number] Date of Birth: 1941/03/08   Medicare Important Message Given:  Yes    Kerin Salen 03/28/2017, 12:26 PM

## 2017-03-28 NOTE — Progress Notes (Signed)
  Speech Language Pathology Treatment: Dysphagia  Patient Details Name: Debra Short MRN: [id number] DOB: 01-02-1941 Today's Date: 03/28/2017 Time: 3539-1225 SLP Time Calculation (min) (ACUTE ONLY): 10 min  Assessment / Plan / Recommendation Clinical Impression  Pt seen to assess po tolerance, provide/review dysphagia mitigation strategies.  Pt denies choking/coughing with intake but does admit to sensing lodging of food in her esophagus.  She admits consuming liquids help clearance and that she must eat slowly to decrease occurrence of lodging.  She also states she consumes Gas X, Tums or Nexium to manage her symptoms.    Pt observed to belch during session but denies refluxing - stating she had not had a bowel movement for several days and constipation worsens her refluxing.  Dr Oletta Lamas is reportedly pt's GI MD and she states she plans to follow up with him as an OP regarding her dysphagia.  SLP provided pt with verbal and written compensation strategies using teach back.    NO follow up indicated from SLP - recommend follow up GI as OP. Pt denies choking on emesis prior to admission.    HPI HPI: Debra Short a 76 y.o.female,w hypertension, hyperlipidemia, large cell lymphoma, gerd, who apparently presented due to n/v, and fever, and altered mental status per her daughter. Pt had 100.8 temp per daughter and seemed slightly confused and thus brought her to the hospital.   In ED. Pt admitted to dysuria, and slight dry cough as well as fever. Pt denies cp, palp, abd pain, diarrhea, brbpr, black stool.   CXR =>left lower lobe pneumonia with adjacent small pleural effusion.   Pt will be admitted for AMS secondary to uti, and left lower lung pneumonia.  CT head is negative for acute findings.        SLP Plan  All goals met       Recommendations  Diet recommendations: Regular;Thin liquid Liquids provided via: Cup;Straw Medication Administration: Whole meds with  liquid Supervision: Patient able to self feed Compensations: Slow rate;Small sips/bites (start meals with liquids, small frequent meals) Postural Changes and/or Swallow Maneuvers: Seated upright 90 degrees;Upright 30-60 min after meal                Follow up Recommendations: None SLP Visit Diagnosis: Dysphagia, unspecified (R13.10) Plan: All goals met       GO               Debra Salk, MS [facility] SLP [phone]  Debra Short 03/28/2017, 2:19 PM

## 2017-03-29 DIAGNOSIS — J181 Lobar pneumonia, unspecified organism: Secondary | ICD-10-CM

## 2017-03-29 LAB — CBC
HCT: 31.7 % — ABNORMAL LOW (ref 36.0–46.0)
Hemoglobin: 10.4 g/dL — ABNORMAL LOW (ref 12.0–15.0)
MCH: 31.3 pg (ref 26.0–34.0)
MCHC: 32.8 g/dL (ref 30.0–36.0)
MCV: 95.5 fL (ref 78.0–100.0)
Platelets: 231 10*3/uL (ref 150–400)
RBC: 3.32 MIL/uL — ABNORMAL LOW (ref 3.87–5.11)
RDW: 13.1 % (ref 11.5–15.5)
WBC: 9.8 10*3/uL (ref 4.0–10.5)

## 2017-03-29 LAB — COMPREHENSIVE METABOLIC PANEL
ALT: 16 U/L (ref 14–54)
AST: 20 U/L (ref 15–41)
Albumin: 2.9 g/dL — ABNORMAL LOW (ref 3.5–5.0)
Alkaline Phosphatase: 40 U/L (ref 38–126)
Anion gap: 11 (ref 5–15)
BUN: 11 mg/dL (ref 6–20)
CO2: 29 mmol/L (ref 22–32)
Calcium: 8.3 mg/dL — ABNORMAL LOW (ref 8.9–10.3)
Chloride: 99 mmol/L — ABNORMAL LOW (ref 101–111)
Creatinine, Ser: 0.71 mg/dL (ref 0.44–1.00)
GFR calc Af Amer: >60 mL/min (ref >60)
GFR calc non Af Amer: >60 mL/min (ref >60)
Glucose, Bld: 97 mg/dL (ref 65–99)
Potassium: 3.3 mmol/L — ABNORMAL LOW (ref 3.5–5.1)
Sodium: 139 mmol/L (ref 135–145)
Total Bilirubin: 1.8 mg/dL — ABNORMAL HIGH (ref 0.3–1.2)
Total Protein: 5.7 g/dL — ABNORMAL LOW (ref 6.5–8.1)

## 2017-03-29 LAB — GLUCOSE, CAPILLARY
Glucose-Capillary: 89 mg/dL (ref 65–99)
Glucose-Capillary: 164 mg/dL — ABNORMAL HIGH (ref 65–99)

## 2017-03-29 MED ORDER — AMOXICILLIN-POT CLAVULANATE 125-31.25 MG/5ML PO SUSR: 875.0000 mg | Freq: Two times a day (BID) | ORAL | 0 refills | Status: AC

## 2017-03-29 MED ORDER — ACETAMINOPHEN 325 MG PO TABS
650.0000 mg | ORAL_TABLET | Freq: Four times a day (QID) | ORAL | Status: DC | PRN
  Administered 2017-03-29: 650 mg via ORAL
  Filled 2017-03-29: qty 2

## 2017-03-29 NOTE — Discharge Summary (Signed)
Physician Discharge Summary  Debra Short KXF:[id number] DOB: [birth year] DOA: 03/25/2017  PCP: Unk [name], MD  Admit date: 03/25/2017 Discharge date: 03/29/2017  Admitted From home Disposition:  home  Recommendations for Outpatient Follow-up:  1. Follow up with PCP in 1-2 weeks 2. Please obtain BMP/CBC in one week  Home Health:none Equipment/Devices:none Discharge Condition stable CODE STATUS:full Diet recommendation: regular  Brief/Interim Summary 76 y.o.female,w hypertension, hyperlipidemia, large cell lymphoma, gerd, who apparently presented due to n/v, and fever, and altered mental status per her daughter. Pt had 100.8 temp per daughter and seemed slightly confused and thus brought her to the hospital. In the hospital she was found to have pneumonia likely aspiration with signs of early sepsis and toxic encephalopathy.Patient was treated for aspiration pneumonia.urine culture negative.   Discharge Diagnoses:  Principal Problem:   CAP (community acquired pneumonia) Active Problems:   Hyperlipidemia   Hypertension   Diabetes mellitus type 2, controlled ([place])   UTI (urinary tract infection)   Hypoxia  1-Aspiration Pneumonia will discharge patient on augmentin to cover anaerobes. 2-DM restart metformin. 3-HTN continue Bisoprolol. 4-Dyslipedemia  Continue statin.  Discharge Instructions follow up with pcp.   Allergies as of 03/29/2017      Reactions   Effexor [venlafaxine] Other (See Comments)   Not effective   Nortriptyline Other (See Comments)   Not effective   Paxil [paroxetine Hcl] Other (See Comments)   Not effective   Robaxin [methocarbamol] Itching   Tizanidine Itching   Zoloft [sertraline Hcl] Other (See Comments)   Not effective      Medication List    STOP taking these medications   acetaminophen 650 MG CR tablet Commonly known as:  TYLENOL     TAKE these medications   ALPRAZolam 1 MG tablet Commonly known as:  XANAX TAKE 1/2 TO 1  TABLET BY MOUTH TWO TO THREE TIMES DAILY AS NEEDED FOR ANXIETY   amoxicillin-clavulanate 125-31.25 MG/5ML suspension Commonly known as:  AUGMENTIN Take 35 mLs (875 mg total) by mouth 2 (two) times daily.   aspirin EC 81 MG tablet Take 81 mg by mouth daily.   atorvastatin 80 MG tablet Commonly known as:  LIPITOR TAKE 1/3 TABLET BY MOUTH DAILY FOR CHOLESTEROL. DISCONTINUE PRAVASTATIN   bisoprolol 5 MG tablet Commonly known as:  ZEBETA TAKE 1 TABLET EVERY MORNING FOR BLOOD PRESSURE   FLUoxetine 40 MG capsule Commonly known as:  PROZAC TAKE 1 CAPSULE (40 MG TOTAL) BY MOUTH DAILY.   fluticasone 50 MCG/ACT nasal spray Commonly known as:  FLONASE PLACE 2 SPRAYS INTO BOTH NOSTRILS DAILY.   gabapentin 300 MG capsule Commonly known as:  NEURONTIN TAKE 2 CAPSULES TWICE A DAY What changed:  See the new instructions.   HYDROcodone-acetaminophen 5-325 MG tablet Commonly known as:  NORCO/VICODIN Take 1 tablet by mouth 2 (two) times daily as needed for moderate pain.   metFORMIN 500 MG 24 hr tablet Commonly known as:  GLUCOPHAGE-XR TAKE 1 TABLET EVERY DAY WITH BREAKFAST   montelukast 10 MG tablet Commonly known as:  SINGULAIR TAKE 1 TABLET EVERY DAY AS NEEDED FOR ALLERGIES   multivitamin with minerals Tabs tablet Take 1 tablet by mouth daily.   NEXIUM PO Take 22.3 mg by mouth See admin instructions. Take 1 capsule (22.3 mg OTC) by mouth every morning before breakfast, may also take 1 capsule in the evening as needed for acid reflux   ondansetron 4 MG tablet Commonly known as:  ZOFRAN Take 1 tablet (4 mg total) by mouth every  8 (eight) hours as needed for nausea or vomiting.   oxybutynin 5 MG tablet Commonly known as:  DITROPAN Take 5 mg by mouth 2 (two) times daily as needed for bladder spasms.   triamcinolone 55 MCG/ACT Aero nasal inhaler Commonly known as:  NASACORT Place 2 sprays into both nostrils daily as needed (for seasonal allergies).       Allergies  Allergen  Reactions  . Effexor [Venlafaxine] Other (See Comments)    Not effective  . Nortriptyline Other (See Comments)    Not effective  . Paxil [Paroxetine Hcl] Other (See Comments)    Not effective  . Robaxin [Methocarbamol] Itching  . Tizanidine Itching  . Zoloft [Sertraline Hcl] Other (See Comments)    Not effective    Consultations:   Procedures/Studies: Dg Chest 2 View  Result Date: 03/25/2017 CLINICAL DATA:  Fever. EXAM: CHEST  2 VIEW COMPARISON:  Chest x-ray dated October 03, 2015. FINDINGS: The cardiomediastinal silhouette is borderline enlarged, similar to prior study. Atherosclerotic calcification of the aortic arch. Left lower lobe consolidation with adjacent small pleural effusion. Right basilar atelectasis. No pneumothorax. No acute osseous abnormality. IMPRESSION: Left lower lobe pneumonia with adjacent small pleural effusion. Electronically Signed   By: Titus Dubin M.D.   On: 03/25/2017 19:04   Ct Head Wo Contrast  Result Date: 03/26/2017 CLINICAL DATA:  Initial evaluation for acute altered mental status, unexplained. EXAM: CT HEAD WITHOUT CONTRAST TECHNIQUE: Contiguous axial images were obtained from the base of the skull through the vertex without intravenous contrast. COMPARISON:  Prior CT from 10/03/2015. FINDINGS: Brain: Mild age-related cerebral atrophy with chronic small vessel ischemic disease. No acute intracranial hemorrhage. No evidence for acute large vessel territory infarct. No mass lesion, midline shift or mass effect. No hydrocephalus. No extra-axial fluid collection. Vascular: No hyperdense vessel. Scattered vascular calcifications noted within the carotid siphons. Skull: Scalp soft tissues and calvarium within normal limits. Sinuses/Orbits: Globes orbital soft tissues normal. Patient status post lens extraction bilaterally. Paranasal sinuses and mastoid air cells are clear. Other: None. IMPRESSION: 1. No acute intracranial abnormality identified. 2. Mild atrophy  with chronic small vessel ischemic disease, stable. Electronically Signed   By: Jeannine Boga M.D.   On: 03/26/2017 06:29   Dg Chest Port 1 View  Result Date: 03/26/2017 CLINICAL DATA:  Shortness of breath and pneumonia. EXAM: PORTABLE CHEST 1 VIEW COMPARISON:  03/25/2017 FINDINGS: The heart size and mediastinal contours are within normal limits. Fairly stable left lower lobe consolidation with possible associated with left pleural effusion. Opacity at the right lung base may represent additional right lower lobe infiltrate versus atelectasis. No overt edema. No pneumothorax. The visualized skeletal structures are unremarkable. IMPRESSION: Stable left lower lobe consolidation with possible associated left pleural effusion. Right basilar infiltrate versus atelectasis. Electronically Signed   By: Aletta Edouard M.D.   On: 03/26/2017 11:38    (Echo, Carotid, EGD, Colonoscopy, ERCP)    Subjective:   Discharge Exam: Vitals:   03/28/17 1958 03/29/17 0500  BP: (!) 145/62 120/74  Pulse: 72 88  Resp: 18 20  Temp: 98.9 F (37.2 C) 99.2 F (37.3 C)  SpO2: 94% 94%   Vitals:   03/28/17 0651 03/28/17 1745 03/28/17 1958 03/29/17 0500  BP: (!) 131/94 125/68 (!) 145/62 120/74  Pulse: 70 81 72 88  Resp: 17 18 18 20   Temp: 99.8 F (37.7 C) 99.2 F (37.3 C) 98.9 F (37.2 C) 99.2 F (37.3 C)  TempSrc: Oral Oral Oral Oral  SpO2: 97%  94% 94% 94%  Weight:      Height:        General: Pt is alert, awake, not in acute distress Cardiovascular: RRR, S1/S2 +, no rubs, no gallops Respiratory: CTA bilaterally, no wheezing, no rhonchi Abdominal: Soft, NT, ND, bowel sounds + Extremities: no edema, no cyanosis    The results of significant diagnostics from this hospitalization (including imaging, microbiology, ancillary and laboratory) are listed below for reference.     Microbiology: Recent Results (from the past 240 hour(s))  Blood Culture (routine x 2)     Status: None (Preliminary  result)   Collection Time: 03/25/17  6:10 PM  Result Value Ref Range Status   Specimen Description BLOOD BLOOD LEFT FOREARM  Final   Special Requests IN PEDIATRIC BOTTLE Blood Culture adequate volume  Final   Culture   Final    NO GROWTH 3 DAYS Performed at [facility], [address] [place], [place] [address]    Report Status PENDING  Incomplete  Blood Culture (routine x 2)     Status: None (Preliminary result)   Collection Time: 03/25/17  6:20 PM  Result Value Ref Range Status   Specimen Description BLOOD RIGHT ANTECUBITAL  Final   Special Requests   Final    BOTTLES DRAWN AEROBIC AND ANAEROBIC Blood Culture adequate volume   Culture   Final    NO GROWTH 3 DAYS Performed at [facility], [place] [address], [place], [place] [address]    Report Status PENDING  Incomplete  Urine culture     Status: Abnormal   Collection Time: 03/25/17  6:33 PM  Result Value Ref Range Status   Specimen Description URINE, CLEAN CATCH  Final   Special Requests NONE  Final   Culture MULTIPLE SPECIES PRESENT, SUGGEST RECOLLECTION (A)  Final   Report Status 03/27/2017 FINAL  Final  MRSA PCR Screening     Status: None   Collection Time: 03/25/17 11:37 PM  Result Value Ref Range Status   MRSA by PCR NEGATIVE NEGATIVE Final    Comment:        The GeneXpert MRSA Assay (FDA approved for NASAL specimens only), is one component of a comprehensive MRSA colonization surveillance program. It is not intended to diagnose MRSA infection nor to guide or monitor treatment for MRSA infections.   Respiratory Panel by PCR     Status: None   Collection Time: 03/26/17  1:56 AM  Result Value Ref Range Status   Adenovirus NOT DETECTED NOT DETECTED Final   Coronavirus 229E NOT DETECTED NOT DETECTED Final   Coronavirus HKU1 NOT DETECTED NOT DETECTED Final   Coronavirus NL63 NOT DETECTED NOT DETECTED Final   Coronavirus OC43 NOT DETECTED NOT DETECTED Final   Metapneumovirus NOT DETECTED NOT DETECTED  Final   Rhinovirus / Enterovirus NOT DETECTED NOT DETECTED Final   Influenza A NOT DETECTED NOT DETECTED Final   Influenza B NOT DETECTED NOT DETECTED Final   Parainfluenza Virus 1 NOT DETECTED NOT DETECTED Final   Parainfluenza Virus 2 NOT DETECTED NOT DETECTED Final   Parainfluenza Virus 3 NOT DETECTED NOT DETECTED Final   Parainfluenza Virus 4 NOT DETECTED NOT DETECTED Final   Respiratory Syncytial Virus NOT DETECTED NOT DETECTED Final   Bordetella pertussis NOT DETECTED NOT DETECTED Final   Chlamydophila pneumoniae NOT DETECTED NOT DETECTED Final   Mycoplasma pneumoniae NOT DETECTED NOT DETECTED Final    Comment: Performed at [facility], [place],  [place] [address]     Labs: BNP (last 3 results) No results for input(s): BNP in the last 8760 hours. Basic Metabolic Panel:  Recent Labs Lab 03/25/17 1831 03/26/17 0652 03/27/17 0349 03/28/17 0419 03/29/17 0405  NA 136 142 145 142 139  K 4.9 4.3 4.0 3.2* 3.3*  CL 94* 109 110 103 99*  CO2 31 28 29  32 29  GLUCOSE 108* 95 113* 121* 97  BUN 26* 17 15 11 11   CREATININE 1.06* 0.70 0.65 0.63 0.71  CALCIUM 8.5* 8.0* 8.7* 8.5* 8.3*  MG  --  1.9 2.0  --   --    Liver Function Tests:  Recent Labs Lab 03/25/17 1831 03/26/17 0652 03/27/17 0349 03/28/17 0419 03/29/17 0405  AST 23 15 14* 18 20  ALT 18 17 13* 15 16  ALKPHOS 47 42 49 44 40  BILITOT 1.8* 0.9 1.0 1.7* 1.8*  PROT 6.5 5.6* 5.8* 5.7* 5.7*  ALBUMIN 3.5 2.9* 2.9* 2.8* 2.9*   No results for input(s): LIPASE, AMYLASE in the last 168 hours. No results for input(s): AMMONIA in the last 168 hours. CBC:  Recent Labs Lab 03/25/17 1831 03/26/17 0652 03/27/17 0349 03/28/17 0419 03/29/17 0405  WBC 18.2* 16.9* 12.3* 13.3* 9.8  NEUTROABS 15.0*  --   --   --   --   HGB 12.8 11.0* 10.7* 10.5* 10.4*  HCT 38.9 34.5* 33.1* 31.1* 31.7*  MCV 96.5 98.3 98.5 95.4 95.5  PLT 228 205 195 210 231   Cardiac Enzymes:  Recent Labs Lab 03/26/17 0101  03/26/17 0652  TROPONINI <0.03 <0.03   BNP: Invalid input(s): POCBNP CBG:  Recent Labs Lab 03/28/17 0754 03/28/17 1307 03/28/17 1825 03/28/17 2158 03/29/17 0813  GLUCAP 86 89 108* 93 89   D-Dimer No results for input(s): DDIMER in the last 72 hours. Hgb A1c No results for input(s): HGBA1C in the last 72 hours. Lipid Profile No results for input(s): CHOL, HDL, LDLCALC, TRIG, CHOLHDL, LDLDIRECT in the last 72 hours. Thyroid function studies No results for input(s): TSH, T4TOTAL, T3FREE, THYROIDAB in the last 72 hours.  Invalid input(s): FREET3 Anemia work up No results for input(s): VITAMINB12, FOLATE, FERRITIN, TIBC, IRON, RETICCTPCT in the last 72 hours. Urinalysis    Component Value Date/Time   COLORURINE YELLOW 03/25/2017 1831   APPEARANCEUR CLEAR 03/25/2017 1831   LABSPEC 1.011 03/25/2017 1831   PHURINE 7.0 03/25/2017 1831   GLUCOSEU NEGATIVE 03/25/2017 1831   HGBUR NEGATIVE 03/25/2017 1831   BILIRUBINUR NEGATIVE 03/25/2017 1831   KETONESUR NEGATIVE 03/25/2017 1831   PROTEINUR NEGATIVE 03/25/2017 1831   UROBILINOGEN 1.0 10/31/2014 0238   NITRITE NEGATIVE 03/25/2017 1831   LEUKOCYTESUR MODERATE (A) 03/25/2017 1831   Sepsis Labs Invalid input(s): PROCALCITONIN,  WBC,  LACTICIDVEN Microbiology Recent Results (from the past 240 hour(s))  Blood Culture (routine x 2)     Status: None (Preliminary result)   Collection Time: 03/25/17  6:10 PM  Result Value Ref Range Status   Specimen Description BLOOD BLOOD LEFT FOREARM  Final   Special Requests IN PEDIATRIC BOTTLE Blood Culture adequate volume  Final   Culture   Final    NO GROWTH 3 DAYS Performed at [facility], [place] [address], [place], [place] [address]    Report Status PENDING  Incomplete  Blood Culture (routine x 2)     Status: None (Preliminary result)   Collection Time: 03/25/17  6:20 PM  Result Value Ref Range Status   Specimen Description BLOOD RIGHT ANTECUBITAL  Final   Special Requests    Final    BOTTLES DRAWN AEROBIC AND ANAEROBIC Blood Culture adequate volume   Culture   Final    NO GROWTH 3 DAYS Performed at [facility], [address], [place], [place] [address]    Report Status PENDING  Incomplete  Urine culture     Status: Abnormal   Collection Time: 03/25/17  6:33 PM  Result Value Ref Range Status   Specimen Description URINE, CLEAN CATCH  Final   Special Requests NONE  Final   Culture MULTIPLE SPECIES PRESENT, SUGGEST RECOLLECTION (A)  Final   Report Status 03/27/2017 FINAL  Final  MRSA PCR Screening     Status: None   Collection Time: 03/25/17 11:37 PM  Result Value Ref Range Status   MRSA by PCR NEGATIVE NEGATIVE Final    Comment:        The GeneXpert MRSA Assay (FDA approved for NASAL specimens only), is one component of a comprehensive MRSA colonization surveillance program. It is not intended to diagnose MRSA infection nor to guide or monitor treatment for MRSA infections.   Respiratory Panel by PCR     Status: None   Collection Time: 03/26/17  1:56 AM  Result Value Ref Range Status   Adenovirus NOT DETECTED NOT DETECTED Final   Coronavirus 229E NOT DETECTED NOT DETECTED Final   Coronavirus HKU1 NOT DETECTED NOT DETECTED Final   Coronavirus NL63 NOT DETECTED NOT DETECTED Final   Coronavirus OC43 NOT DETECTED NOT DETECTED Final   Metapneumovirus NOT DETECTED NOT DETECTED Final   Rhinovirus / Enterovirus NOT DETECTED NOT DETECTED Final   Influenza A NOT DETECTED NOT DETECTED Final   Influenza B NOT DETECTED NOT DETECTED Final   Parainfluenza Virus 1 NOT DETECTED NOT DETECTED Final   Parainfluenza Virus 2 NOT DETECTED NOT DETECTED Final   Parainfluenza Virus 3 NOT DETECTED NOT DETECTED Final   Parainfluenza Virus 4 NOT DETECTED NOT DETECTED Final   Respiratory Syncytial Virus NOT DETECTED NOT DETECTED Final   Bordetella pertussis NOT DETECTED NOT DETECTED Final   Chlamydophila pneumoniae NOT DETECTED NOT DETECTED Final   Mycoplasma  pneumoniae NOT DETECTED NOT DETECTED Final    Comment: Performed at [facility], [place] [address], [place],  [address]     Time coordinating discharge: Over 30 minutes  SIGNED:   Georgette Shell, MD  [facility] 03/29/2017, 11:13 AM  If 7PM-7AM, please contact night-coverage [email or web address] Password [email or web address]

## 2017-03-29 NOTE — Care Management Note (Signed)
Case Management Note  Patient Details  Name: [name] MRN: [id number] Date of Birth: April 22, 1941  Subjective/Objective:            76 yo admitted with CAP        Action/Plan: From home. Physical Therapy recommendations gone over with pt. Pt declines home health services at this time. Pt states she has a prescription from her PCP for outpatient physical therapy and she prefers to do outpatient when she discharges.  Expected Discharge Date:                  Expected Discharge Plan:  Home/Self Care  In-House Referral:     Discharge planning Services  CM Consult  Post Acute Care Choice:    Choice offered to:     DME Arranged:    DME Agency:     HH Arranged:    HH Agency:     Status of Service:  In process, will continue to follow  If discussed at Long Length of Stay Meetings, dates discussed:    Additional CommentsLynnell Catalan, RN 03/29/2017, 10:07 AM [phone]

## 2017-03-30 LAB — CULTURE, BLOOD (ROUTINE X 2)
Culture: NO GROWTH
Culture: NO GROWTH
Special Requests: ADEQUATE
Special Requests: ADEQUATE

## 2017-04-04 ENCOUNTER — Telehealth: Payer: Self-pay | Admitting: *Deleted

## 2017-04-04 NOTE — Telephone Encounter (Signed)
Called pt scheduled her hosp fu with amanda on 10/24

## 2017-04-05 ENCOUNTER — Encounter: Payer: Self-pay | Admitting: Physician Assistant

## 2017-04-05 ENCOUNTER — Ambulatory Visit (INDEPENDENT_AMBULATORY_CARE_PROVIDER_SITE_OTHER): Payer: Medicare Other | Admitting: Physician Assistant

## 2017-04-05 VITALS — BP 126/70 | HR 63 | Temp 97.3°F | Resp 14 | Ht 65.0 in | Wt 155.4 lb

## 2017-04-05 DIAGNOSIS — J181 Lobar pneumonia, unspecified organism: Secondary | ICD-10-CM

## 2017-04-05 DIAGNOSIS — F331 Major depressive disorder, recurrent, moderate: Secondary | ICD-10-CM | POA: Diagnosis not present

## 2017-04-05 DIAGNOSIS — R1310 Dysphagia, unspecified: Secondary | ICD-10-CM

## 2017-04-05 DIAGNOSIS — R0902 Hypoxemia: Secondary | ICD-10-CM

## 2017-04-05 DIAGNOSIS — E1122 Type 2 diabetes mellitus with diabetic chronic kidney disease: Secondary | ICD-10-CM

## 2017-04-05 DIAGNOSIS — J189 Pneumonia, unspecified organism: Secondary | ICD-10-CM

## 2017-04-05 DIAGNOSIS — N182 Chronic kidney disease, stage 2 (mild): Secondary | ICD-10-CM | POA: Diagnosis not present

## 2017-04-05 DIAGNOSIS — H00011 Hordeolum externum right upper eyelid: Secondary | ICD-10-CM

## 2017-04-05 DIAGNOSIS — E1121 Type 2 diabetes mellitus with diabetic nephropathy: Secondary | ICD-10-CM

## 2017-04-05 MED ORDER — BUPROPION HCL ER (XL) 150 MG PO TB24: 150.0000 mg | ORAL_TABLET | ORAL | 2 refills | Status: DC

## 2017-04-05 MED ORDER — FLUOXETINE HCL 20 MG PO CAPS: 20.0000 mg | ORAL_CAPSULE | Freq: Every day | ORAL | 2 refills | Status: DC

## 2017-04-05 MED ORDER — POLYMYXIN B-TRIMETHOPRIM 10000-0.1 UNIT/ML-% OP SOLN: 1.0000 [drp] | OPHTHALMIC | 0 refills | Status: DC

## 2017-04-05 NOTE — Progress Notes (Signed)
Hospital follow up  Assessment and Plan:   Community acquired pneumonia of left lower lobe of lung ([place]) Finish augmentin  Hypoxia Resolved  Controlled type 2 diabetes mellitus with diabetic nephropathy, without long-term current use of insulin (HCC) -     CBC with Differential/Platelet -     BASIC METABOLIC PANEL WITH GFR  Type 2 diabetes mellitus with stage 2 chronic kidney disease, without long-term current use of insulin (HCC) -     CBC with Differential/Platelet -     BASIC METABOLIC PANEL WITH GFR  Hordeolum externum of right upper eyelid -     trimethoprim-polymyxin b (POLYTRIM) ophthalmic solution; Place 1 drop into the right eye every 4 (four) hours. For 10 days. Dispense quantity sufficient.  Dysphagia, unspecified type Has follow up with Dr. Oletta Lamas Given information about rules  Moderate episode of recurrent major depressive disorder ([place]) -     buPROPion (WELLBUTRIN XL) 150 MG 24 hr tablet; Take 1 tablet (150 mg total) by mouth every morning. -     FLUoxetine (PROZAC) 20 MG capsule; Take 1 capsule (20 mg total) by mouth daily.   Hospital discharge meds were reviewed, and reconciled with the patient.    Medications Discontinued During This Encounter  Medication Reason  . FLUoxetine (PROZAC) 40 MG capsule     Over 40 minutes of exam, counseling, chart review, and complex, high/moderate level critical decision making was performed this visit.     HPI 76 y.o.female presents for follow up for transition from recent hospitalization or SNIF stay. Admit date to the hospital was 03/25/17, patient was discharged from the hospital on 03/29/17 and our clinical staff contacted the office the day after discharge to set up a follow up appointment. The discharge summary, medications, and diagnostic test results were reviewed before meeting with the patient. The patient was admitted for:  Left lobular pnemonia possible aspiration related, with hypoxia and AMS. She was brought  by her daughter, found to have CAP, normal CT head for age, discharged on Augmentin she has 6 days left.  Needs repeat CBC/BMP. She has dry cough, no fever, chills.  Noticed that her right eye looks more red this AM, no discharge, no blurry vision, some pain in the eye.  She has follow up with Dr. Oletta Lamas for dysphagia.  She has been having depression, decreased mood to do things.   Home health is not involved.   Images while in the hospital: Dg Chest 2 View  Result Date: 03/25/2017 CLINICAL DATA:  Fever. EXAM: CHEST  2 VIEW COMPARISON:  Chest x-ray dated October 03, 2015. FINDINGS: The cardiomediastinal silhouette is borderline enlarged, similar to prior study. Atherosclerotic calcification of the aortic arch. Left lower lobe consolidation with adjacent small pleural effusion. Right basilar atelectasis. No pneumothorax. No acute osseous abnormality. IMPRESSION: Left lower lobe pneumonia with adjacent small pleural effusion. Electronically Signed   By: Titus Dubin M.D.   On: 03/25/2017 19:04   Ct Head Wo Contrast  Result Date: 03/26/2017 CLINICAL DATA:  Initial evaluation for acute altered mental status, unexplained. EXAM: CT HEAD WITHOUT CONTRAST TECHNIQUE: Contiguous axial images were obtained from the base of the skull through the vertex without intravenous contrast. COMPARISON:  Prior CT from 10/03/2015. FINDINGS: Brain: Mild age-related cerebral atrophy with chronic small vessel ischemic disease. No acute intracranial hemorrhage. No evidence for acute large vessel territory infarct. No mass lesion, midline shift or mass effect. No hydrocephalus. No extra-axial fluid collection. Vascular: No hyperdense vessel. Scattered vascular calcifications noted  within the carotid siphons. Skull: Scalp soft tissues and calvarium within normal limits. Sinuses/Orbits: Globes orbital soft tissues normal. Patient status post lens extraction bilaterally. Paranasal sinuses and mastoid air cells are clear. Other:  None. IMPRESSION: 1. No acute intracranial abnormality identified. 2. Mild atrophy with chronic small vessel ischemic disease, stable. Electronically Signed   By: Jeannine Boga M.D.   On: 03/26/2017 06:29   Dg Chest Port 1 View  Result Date: 03/26/2017 CLINICAL DATA:  Shortness of breath and pneumonia. EXAM: PORTABLE CHEST 1 VIEW COMPARISON:  03/25/2017 FINDINGS: The heart size and mediastinal contours are within normal limits. Fairly stable left lower lobe consolidation with possible associated with left pleural effusion. Opacity at the right lung base may represent additional right lower lobe infiltrate versus atelectasis. No overt edema. No pneumothorax. The visualized skeletal structures are unremarkable. IMPRESSION: Stable left lower lobe consolidation with possible associated left pleural effusion. Right basilar infiltrate versus atelectasis. Electronically Signed   By: Aletta Edouard M.D.   On: 03/26/2017 11:38    Past Medical History:  Diagnosis Date  . Fibromyalgia   . GERD (gastroesophageal reflux disease)   . Hyperlipidemia   . Hypertension   . IBS (irritable bowel syndrome)   . 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 Vitamin D deficiency      Allergies  Allergen Reactions  . Effexor [Venlafaxine] Other (See Comments)    Not effective  . Nortriptyline Other (See Comments)    Not effective  . Paxil [Paroxetine Hcl] Other (See Comments)    Not effective  . Robaxin [Methocarbamol] Itching  . Tizanidine Itching  . Zoloft [Sertraline Hcl] Other (See Comments)    Not effective      Current Outpatient Prescriptions on File Prior to Visit  Medication Sig Dispense Refill  . ALPRAZolam (XANAX) 1 MG tablet TAKE 1/2 TO 1 TABLET BY MOUTH TWO TO THREE TIMES DAILY AS NEEDED FOR ANXIETY 270 tablet 0  . amoxicillin-clavulanate (AUGMENTIN) 125-31.25 MG/5ML suspension Take 35 mLs (875 mg total) by mouth 2 (two) times daily. 100 mL 0  . aspirin EC 81 MG tablet Take 81 mg by mouth daily.     Marland Kitchen atorvastatin (LIPITOR) 80 MG tablet TAKE 1/3 TABLET BY MOUTH DAILY FOR CHOLESTEROL. DISCONTINUE PRAVASTATIN 30 tablet 2  . bisoprolol (ZEBETA) 5 MG tablet TAKE 1 TABLET EVERY MORNING FOR BLOOD PRESSURE 90 tablet 1  . Esomeprazole Magnesium (NEXIUM PO) Take 22.3 mg by mouth See admin instructions. Take 1 capsule (22.3 mg OTC) by mouth every morning before breakfast, may also take 1 capsule in the evening as needed for acid reflux    . fluticasone (FLONASE) 50 MCG/ACT nasal spray PLACE 2 SPRAYS INTO BOTH NOSTRILS DAILY. 48 g 1  . gabapentin (NEURONTIN) 300 MG capsule TAKE 2 CAPSULES TWICE A DAY (Patient taking differently: TAKE 2 CAPSULES AT BEDTIME) 360 capsule 1  . HYDROcodone-acetaminophen (NORCO/VICODIN) 5-325 MG tablet Take 1 tablet by mouth 2 (two) times daily as needed for moderate pain. 30 tablet 0  . metFORMIN (GLUCOPHAGE-XR) 500 MG 24 hr tablet TAKE 1 TABLET EVERY DAY WITH BREAKFAST 90 tablet 1  . montelukast (SINGULAIR) 10 MG tablet TAKE 1 TABLET EVERY DAY AS NEEDED FOR ALLERGIES 90 tablet 1  . Multiple Vitamin (MULTIVITAMIN WITH MINERALS) TABS tablet Take 1 tablet by mouth daily.    . ondansetron (ZOFRAN) 4 MG tablet Take 1 tablet (4 mg total) by mouth every 8 (eight) hours as needed for nausea or vomiting. 60 tablet 1  .  oxybutynin (DITROPAN) 5 MG tablet Take 5 mg by mouth 2 (two) times daily as needed for bladder spasms.     Marland Kitchen triamcinolone (NASACORT) 55 MCG/ACT AERO nasal inhaler Place 2 sprays into both nostrils daily as needed (for seasonal allergies).      No current facility-administered medications on file prior to visit.     ROS: all negative except above.   Physical Exam: Filed Weights   04/05/17 1509  Weight: 155 lb 6.4 oz (70.5 kg)   BP 126/70   Pulse 63   Temp (!) 97.3 F (36.3 C)   Resp 14   Ht 5\' 5"  (1.651 m)   Wt 155 lb 6.4 oz (70.5 kg)   SpO2 95%   BMI 25.86 kg/m  General Appearance: Well nourished, in no apparent distress. Eyes: PERRLA, EOMs,  conjunctiva no swelling or erythema Sinuses: No Frontal/maxillary tenderness ENT/Mouth: Ext aud canals clear, TMs without erythema, bulging. No erythema, swelling, or exudate on post pharynx.  Tonsils not swollen or erythematous. Hearing normal.  Neck: Supple, thyroid normal.  Respiratory: Respiratory effort normal, BS equal bilaterally without rales, rhonchi, wheezing or stridor.  Cardio: RRR with no MRGs. Brisk peripheral pulses without edema.  Abdomen: Soft, + BS.  Non tender, no guarding, rebound, hernias, masses. Lymphatics: Non tender without lymphadenopathy.  Musculoskeletal: Full ROM, 5/5 strength, normal gait.  Skin: Warm, dry without rashes, lesions, ecchymosis.  Neuro: Cranial nerves intact. Normal muscle tone, no cerebellar symptoms. Sensation intact.  Psych: Awake and oriented X 3, normal affect, Insight and Judgment appropriate.     [name], PA-C 3:43 PM [facility]

## 2017-04-05 NOTE — Patient Instructions (Addendum)
Get on allergy pill Please pick one of the over the counter allergy medications below and take it once daily for allergies.  Claritin or loratadine cheapest but likely the weakest  Zyrtec or certizine at night because it can make you sleepy The strongest is allegra or fexafinadine  Cheapest at [employer], [employer], [employer]  Can try to raise head of your bed or get wedge pillow to help with cough  Warm wet compresses on that eye And can do drops If any changes in vision or any change in that eye go to eye doctor  FOLLOW UP WITH DR. Oletta Lamas  Aspiration Precautions, Adult Aspiration is the breathing in (inhalation) of a liquid or object into the lungs. Things that can be inhaled into the lungs include:  Food.  Any type of liquid, such as drinks or saliva.  Stomach contents, such as vomit or stomach acid.  What are the signs of aspiration? Signs of aspiration include:  Coughing after swallowing food or liquids.  Clearing the throat often while eating.  Trouble breathing. This may include: ? Breathing quickly. ? Breathing very slowly. ? Loud breathing. ? Rumbling sounds from the lungs while breathing.  Coughing up phlegm (sputum) that: ? Is yellow, tan, or green. ? Has pieces of food in it. ? Is bad-smelling.  Having a hoarse, barky cough.  Not being able to speak.  A hoarse voice.  Drooling while eating.  A feeling of fullness in the throat or a feeling that something is stuck in the throat.  Choking often.  Having a runny noise while eating.  Coughing when lying down or having to sit up quickly after lying down.  A change in skin color. The skin may look red or blue.  Fever.  Watery eyes.  Pain in the chest or back.  A pained look on the face.   What can I do to prevent aspiration?   Caring for someone who can eat and drink safely by mouth If you are caring for someone who can eat and drink safely through his or her mouth:  Have the person sit in an  upright position when eating food or drinking fluids. This can be done in two ways: ? Have the person sit up in a chair. ? If sitting in a chair is not possible, position the person in bed so he or she is upright.  Remind the person to eat slowly and chew well. Make sure the person is awake and alert while eating.  Do not distract the person.   Allow foods to cool. Hot foods may be more difficult to swallow.  Provide small meals more frequently, instead of 3 large meals. This may reduce fatigue during eating.  Check the person's mouth thoroughly for leftover food after eating.  Keep the person sitting upright for 30-45 minutes after eating.  Do not serve food or drink during 2 hours or more before bedtime.  General instructions Follow these general guidelines to prevent aspiration in someone who can eat and drink safely by mouth:  Never put food or liquids in the mouth of a person who is not fully alert.  Feed small amounts of food. Do not force feed.  For a person who is on a diet for swallowing difficulty (dysphagia diet), follow the recommended food and drink consistency. For example, in dysphagia diet level 1, thicken liquids to pudding-like consistency.  Use as little water as possible when brushing the person's teeth or cleaning his or her mouth.  Provide oral care before and after meals.  Use adaptive devices such as cut-out cups, straws, or utensils as told by the health care provider.  Crush pills and put them in soft food such as pudding or ice cream. Some pills should not be crushed. Check with the health care provider before crushing any medicine.  Contact a health care provider if:  The person has a feeding tube, and the feeding tube residual amount is too high.  The person has a fever.  The person tries to avoid food or water, such as refusing to eat, drink, or be fed, or is eating less than normal.  The person may have aspirated food or liquid.  You notice  warning signs, such as choking or coughing, when the person eats or drinks. Get help right away if:  The person has trouble breathing or starts to breathe quickly.  The person is breathing very slowly or stops breathing.  The person coughs a lot after eating or drinking.  The person has a long-lasting (chronic) cough.  The person coughs up thick, yellow, or tan sputum.  If someone is choking on food or an object, perform the Heimlich maneuver (abdominal thrusts).  The person has symptoms of pneumonia, such as: ? Coughing a lot. ? Coughing up mucus with a bad smell or blood in it. ? Feeling short of breath. ? Complaining of chest pain. ? Sweating, fever, and chills. ? Feeling tired. ? Complaining of trouble breathing. ? Wheezing.  The person cannot stop choking.  The person is unable to breathe, turns blue, faints, or seems confused. These symptoms may represent a serious problem that is an emergency. Do not wait to see if the symptoms will go away. Get medical help right away. Call your local emergency services (911 in the [place].).  Summary  Aspiration is the breathing in (inhalation) of a liquid or object into the lungs. Things that can be inhaled into the lungs include food, liquids, saliva, or stomach contents.  Aspiration can cause pneumonia or choking.  One sign of aspiration is coughing after swallowing food or liquids.  Contact a health care provider if you notice signs of aspiration. This information is not intended to replace advice given to you by your health care provider. Make sure you discuss any questions you have with your health care provider. Document Released: 07/02/2010 Document Revised: 02/25/2016 Document Reviewed: 02/25/2016 Elsevier Interactive Patient Education  2018 [place].  CAN TAKE PROZAC 40MG EVERY OTHER DAY UNTIL YOU RUN OUT THEN GET THE 20 MG AND TAKE DAILY ADD ON WELLBUTRIN 150MG DAILY TO THE PROZAC FOR ENERGY , DEPRESSION AND  CONCENTRATION PLEASE CHECK OUT THESE COMMUNITY RESOURCES BELOW AND TRY ONE   Producer, television/film/video Information Description of Services Cost  A Matter of Balance Class locations vary. Call [place] on Aging for more information.  http://dawson-may.com/ 5176755931 8-Session program addressing the fear of falling and increasing activity levels of older adults Free to minimal cost  A.C.T. By [employer] [address], [place], [place] [address].  BetaBlues.dk [phone]  Personal training, gym, classes including Silver Sneakers* and ACTion for Aging Adults Fee-based  A.H.O.Y. (Add Health to [place]) Airs on Time [employer] 13, M-F at [place]: [employer],  [place], [address] [facility] [place],  [place], [place] Bobby Rumpf  Rec Center, [address] Lavena [facility], [place], [place], [place] [address]  [place] Location: Sharrell Ku. [employer] [place] [facility]      [phone]  [phone]  [phone]  [phone]  [phone]  [phone]  [phone]  [phone]  [phone]  [phone]    [phone] A total-body conditioning class for adults 51 and older; designed to increase muscular strength, endurance, range of movement, flexibility, balance, agility and coordination Free  [facility] [place], [place] [address] [place]      [address]      [phone]      Pilate's class for individualsreturning to exercise after an injury, before or after surgery or for individuals with complex musculoskeletal issues;  designed to improve strength, balance , flexibility      $15/class  [place] [address] [place], [place] [address] www.[email or web address] [place] classes for beginners to advanced [place], [place] [address] [email or web address]-[email or web address] B[place]le Chair Exercises Free, ages 42 and older; Ages 67-59 fee based  Marvia Pickles, [facility] [address] [place], [place] [address] Seniorcenter'[email or web address]' .Beverlee Nims [phone]  A.H.O.Y. Tai Chi Fee-based Donation based or free  House Class locations vary.  Call or email Angela Burke or view website for more information. [email or web address] .[email or web address] [email or web address] [phone] Ongoing classes at local YMCAs and gyms Fee-based  Silver Sneakers A.C.T. By [place] [employer]: [place] [phone]  [phone] Classes designed for older adults who want to improve their strength, flexibility, balance and endurance.   Silver sneakers is covered by some insurance plans and includes a fitness center membership at participating locations. Find out more by calling [phone] or visiting [email or web address] Covered by some insurance plans  [facility] [phone] A.H.O.Y., fitness room, personal training, fitness classes for injury prevention, strength, balance, flexibility, water fitness classes Ages 55+:  $27 for 6 months; Ages 41-54: $20 for 6 months  Tai Chi for Everybody [facility] [address], [place] [address] [email or web address] .Patsi Sears [phone] Tai Chi classes for beginners to advanced; geared for seniors Donation Based      [employer] (Helpling Others Participate in Exercise     Loyal Gambler. Rosana Hoes, PhD, [place] [email or web address] [place]     [phone]     A comprehensive fitness program for adults.  The program paris senior-level undergraduates Kinesiology students with adults who desire to learn how to exercise safely.  Includes a structural exercise class focusing on functional fitnesss     $100/semester in fall and spring; $75 in summer (no trainers)    *Silver Sneakers is covered by some Personal assistant and includes a  Radio producer at participating locations.  Find out more by calling [phone] or visiting [email or web address]  For additional health and human services resources for senior adults, please contact SeniorLine at [phone] in [place] and [place] at  [phone] in all other areas.  Counseling services  Here are some numbers below you can try but I suggest calling your insurance and finding out who is in your network and THEN calling those people or looking them up on google.   I'm a big fan of Cognitive Behavioral Therapy, look this up on You tube or check with the therapist you see if they are certified.  This form of therapy helps to teach you skills to better handle with current situation that are causing anxiety or depression.   Neuropsychiatric care [place], NP [place].  Suite [phone] Fax [phone]  [facility] Hours: Monday-Thursday 830-8pm  Friday 830AM-7PM Address: [place] [phone].  Address: [place], [place] [address] [place] for Cognitive  Behavior Therapy [phone] office [email or web address] [address] [place], [place], [place] [address]  Dr. Arbutus Leas, Ph.D. [address] [place] [address] Phone: [phone], Bureau [phone] [place] [address], [place] [address]   Toy Cookey, MA, clinical psychologist  Cognitive-Behavior Therapy; Mood Disorders; Anxiety Disorders; adult and child ADHD; Family Therapy; Stress Management; personal growth, and Marital Therapy.    Terrance Mass Ph.D., clinical psychologist Cognitive-Behavior Therapy; Mood Disorders; Anxiety Disorders; Stress     Management  [facility] [address], [place], [place] [address] [phone]   The [place], psychotherapist [address] [place], [place] [address] [phone]  Karin Golden Ph.D., clinical psychologist [phone] office [place], [place] [address] Cognitive Behavior Therapy, Depression, Bipolar, Anxiety, Grief and Loss

## 2017-04-06 LAB — CBC WITH DIFFERENTIAL/PLATELET
Basophils Absolute: 46 cells/uL (ref 0–200)
Basophils Relative: 0.4 %
Eosinophils Absolute: 196 cells/uL (ref 15–500)
Eosinophils Relative: 1.7 %
HCT: 38.6 % (ref 35.0–45.0)
Hemoglobin: 13 g/dL (ref 11.7–15.5)
Lymphs Abs: 1668 cells/uL (ref 850–3900)
MCH: 31.1 pg (ref 27.0–33.0)
MCHC: 33.7 g/dL (ref 32.0–36.0)
MCV: 92.3 fL (ref 80.0–100.0)
MPV: 9.1 fL (ref 7.5–12.5)
Monocytes Relative: 7.1 %
Neutro Abs: 8775 cells/uL — ABNORMAL HIGH (ref 1500–7800)
Neutrophils Relative %: 76.3 %
Platelets: 413 10*3/uL — ABNORMAL HIGH (ref 140–400)
RBC: 4.18 10*6/uL (ref 3.80–5.10)
RDW: 12.9 % (ref 11.0–15.0)
Total Lymphocyte: 14.5 %
WBC mixed population: 817 cells/uL (ref 200–950)
WBC: 11.5 10*3/uL — ABNORMAL HIGH (ref 3.8–10.8)

## 2017-04-06 LAB — BASIC METABOLIC PANEL WITH GFR
BUN: 19 mg/dL (ref 7–25)
CO2: 32 mmol/L (ref 20–32)
Calcium: 9.9 mg/dL (ref 8.6–10.4)
Chloride: 101 mmol/L (ref 98–110)
Creat: 0.72 mg/dL (ref 0.60–0.93)
GFR, Est African American: 93 mL/min/{1.73_m2} (ref >=60)
GFR, Est Non African American: 80 mL/min/{1.73_m2} (ref >=60)
Glucose, Bld: 87 mg/dL (ref 65–99)
Potassium: 5.7 mmol/L — ABNORMAL HIGH (ref 3.5–5.3)
Sodium: 142 mmol/L (ref 135–146)

## 2017-04-12 NOTE — Progress Notes (Signed)
Pt aware of lab results & voiced understanding of those results.

## 2017-04-17 ENCOUNTER — Other Ambulatory Visit (INDEPENDENT_AMBULATORY_CARE_PROVIDER_SITE_OTHER): Payer: Self-pay

## 2017-04-17 NOTE — Telephone Encounter (Signed)
Patient would like a Rx refill on Hydrocodone.  CB# is [phone].  Please advise.  Thank You.

## 2017-04-18 ENCOUNTER — Other Ambulatory Visit: Payer: Self-pay | Admitting: Gastroenterology

## 2017-04-18 DIAGNOSIS — J690 Pneumonitis due to inhalation of food and vomit: Secondary | ICD-10-CM | POA: Diagnosis not present

## 2017-04-18 DIAGNOSIS — R1310 Dysphagia, unspecified: Secondary | ICD-10-CM | POA: Diagnosis not present

## 2017-04-18 MED ORDER — HYDROCODONE-ACETAMINOPHEN 5-325 MG PO TABS: 1.0000 | ORAL_TABLET | Freq: Two times a day (BID) | ORAL | 0 refills | Status: DC | PRN

## 2017-04-18 NOTE — Telephone Encounter (Signed)
Ok, renew agreement

## 2017-04-18 NOTE — Telephone Encounter (Signed)
Last Visit: 02/22/17 Next Visit: 08/24/17  UDS: 10/24/16 Narc Agreement: 01/18/16   Okay to refill Hydrocodone?

## 2017-04-20 ENCOUNTER — Ambulatory Visit
Admission: RE | Admit: 2017-04-20 | Discharge: 2017-04-20 | Disposition: A | Discharge status: Home / Self Care | Payer: Medicare Other | Source: Ambulatory Visit | Attending: Gastroenterology | Admitting: Gastroenterology

## 2017-04-20 DIAGNOSIS — K219 Gastro-esophageal reflux disease without esophagitis: Secondary | ICD-10-CM | POA: Diagnosis not present

## 2017-04-20 DIAGNOSIS — R1310 Dysphagia, unspecified: Secondary | ICD-10-CM

## 2017-04-24 ENCOUNTER — Other Ambulatory Visit: Payer: Self-pay | Admitting: Gastroenterology

## 2017-04-24 DIAGNOSIS — R1310 Dysphagia, unspecified: Secondary | ICD-10-CM

## 2017-04-25 ENCOUNTER — Ambulatory Visit (INDEPENDENT_AMBULATORY_CARE_PROVIDER_SITE_OTHER): Payer: Medicare Other | Admitting: Physician Assistant

## 2017-04-25 ENCOUNTER — Encounter: Payer: Self-pay | Admitting: Physician Assistant

## 2017-04-25 VITALS — BP 126/84 | HR 78 | Temp 98.6°F | Resp 16 | Ht 65.0 in | Wt 154.6 lb

## 2017-04-25 DIAGNOSIS — E1122 Type 2 diabetes mellitus with diabetic chronic kidney disease: Secondary | ICD-10-CM | POA: Diagnosis not present

## 2017-04-25 DIAGNOSIS — Z79899 Other long term (current) drug therapy: Secondary | ICD-10-CM

## 2017-04-25 DIAGNOSIS — Z0001 Encounter for general adult medical examination with abnormal findings: Secondary | ICD-10-CM

## 2017-04-25 DIAGNOSIS — M5136 Other intervertebral disc degeneration, lumbar region: Secondary | ICD-10-CM | POA: Diagnosis not present

## 2017-04-25 DIAGNOSIS — C8590 Non-Hodgkin lymphoma, unspecified, unspecified site: Secondary | ICD-10-CM

## 2017-04-25 DIAGNOSIS — Z6825 Body mass index (BMI) 25.0-25.9, adult: Secondary | ICD-10-CM

## 2017-04-25 DIAGNOSIS — N182 Chronic kidney disease, stage 2 (mild): Secondary | ICD-10-CM

## 2017-04-25 DIAGNOSIS — M170 Bilateral primary osteoarthritis of knee: Secondary | ICD-10-CM | POA: Diagnosis not present

## 2017-04-25 DIAGNOSIS — R6889 Other general symptoms and signs: Secondary | ICD-10-CM | POA: Diagnosis not present

## 2017-04-25 DIAGNOSIS — I10 Essential (primary) hypertension: Secondary | ICD-10-CM

## 2017-04-25 DIAGNOSIS — K219 Gastro-esophageal reflux disease without esophagitis: Secondary | ICD-10-CM | POA: Diagnosis not present

## 2017-04-25 DIAGNOSIS — E782 Mixed hyperlipidemia: Secondary | ICD-10-CM | POA: Diagnosis not present

## 2017-04-25 DIAGNOSIS — Z0000 Encounter for general adult medical examination without abnormal findings: Secondary | ICD-10-CM

## 2017-04-25 DIAGNOSIS — M797 Fibromyalgia: Secondary | ICD-10-CM

## 2017-04-25 DIAGNOSIS — E2839 Other primary ovarian failure: Secondary | ICD-10-CM

## 2017-04-25 DIAGNOSIS — E1121 Type 2 diabetes mellitus with diabetic nephropathy: Secondary | ICD-10-CM

## 2017-04-25 DIAGNOSIS — F3341 Major depressive disorder, recurrent, in partial remission: Secondary | ICD-10-CM

## 2017-04-25 DIAGNOSIS — K589 Irritable bowel syndrome without diarrhea: Secondary | ICD-10-CM

## 2017-04-25 DIAGNOSIS — E559 Vitamin D deficiency, unspecified: Secondary | ICD-10-CM | POA: Diagnosis not present

## 2017-04-25 NOTE — Patient Instructions (Addendum)
Stay on the prozac and fluoxetine but add on the wellbutrin once a day for at least 1 month to see if it helps with depression  [facility]  7 a.m.-6:30 p.m., Monday 7 a.m.-5 p.m., Tuesday-Friday Schedule an appointment by calling [phone].  Encourage you to get the 3D Mammogram  The 3D Mammogram is much more specific and sensitive to pick up breast cancer. For women with fibrocystic breast or lumpy breast it can be hard to determine if it is cancer or not but the 3D mammogram is able to tell this difference which cuts back on unneeded additional tests or scary call backs.   - over 40% increase in detection of breast cancer - over 40% reduction in false positives.  - fewer call backs - reduced anxiety - improved outcomes - [facility]   Can get toe separator for left foot for bunions Or we can refer to podiatrist   Bunion A bunion is a bump on the base of the big toe that forms when the bones of the big toe joint move out of position. Bunions may be small at first, but they often get larger over time. The can make walking painful. What are the causes? A bunion may be caused by:  Wearing narrow or pointed shoes that force the big toe to press against the other toes.  Abnormal foot development that causes the foot to roll inward (pronate).  Changes in the foot that are caused by certain diseases, such as rheumatoid arthritis and polio.  A foot injury.  What increases the risk? The following factors may make you more likely to develop this condition:  Wearing shoes that squeeze the toes together.  Having certain diseases, such as: ? Rheumatoid arthritis. ? Polio. ? Cerebral palsy.  Having family members who have bunions.  Being born with a foot deformity, such as flat feet or low arches.  Doing activities that put a lot of pressure on the feet, such as ballet dancing.  What are the signs or symptoms? The main symptom of a bunion is  a noticeable bump on the big toe. Other symptoms may include:  Pain.  Swelling around the big toe.  Redness and inflammation.  Thick or hardened skin on the big toe or between the toes.  Stiffness or loss of motion in the big toe.  Trouble with walking.  How is this diagnosed? A bunion may be diagnosed based on your symptoms, medical history, and activities. You may have tests, such as:  X-rays. These allow your health care provider to check the position of the bones in your foot and look for damage to your joint. They also help your health care provider to determine the severity of your bunion and the best way to treat it.  Joint aspiration. In this test, a sample of fluid is removed from the toe joint. This test, which may be done if you are in a lot of pain, helps to rule out diseases that cause painful swelling of the joints, such as arthritis.  How is this treated? There is no cure for a bunion, but treatment can help to prevent a bunion from getting worse. Treatment depends on the severity of your symptoms. Your health care provider may recommend:  Wearing shoes that have a wide toe box.  Using bunion pads to cushion the affected area.  Taping your toes together to keep them in a normal position.  Placing a device inside your shoe (  orthotics) to help reduce pressure on your toe joint.  Taking medicine to ease pain, inflammation, and swelling.  Applying heat or ice to the affected area.  Doing stretching exercises.  Surgery to remove scar tissue and move the toes back into their normal position. This treatment is rare.  Follow these instructions at home:  Support your toe joint with proper footwear, shoe padding, or taping as told by your health care provider.  Take over-the-counter and prescription medicines only as told by your health care provider.  If directed, apply ice to the injured area: ? Put ice in a plastic bag. ? Place a towel between your skin and the  bag. ? Leave the ice on for 20 minutes, 2-3 times per day.  If directed, apply heat to the affected area before you exercise. Use the heat source that your health care provider recommends, such as a moist heat pack or a heating pad. ? Place a towel between your skin and the heat source. ? Leave the heat on for 20-30 minutes. ? Remove the heat if your skin turns bright red. This is especially important if you are unable to feel pain, heat, or cold. You may have a greater risk of getting burned.  Do exercises as told by your health care provider.  Keep all follow-up visits as told by your health care provider. Contact a health care provider if:  Your symptoms get worse.  Your symptoms do not improve in 2 weeks. Get help right away if:  You have severe pain and trouble with walking. This information is not intended to replace advice given to you by your health care provider. Make sure you discuss any questions you have with your health care provider. Document Released: 05/30/2005 Document Revised: 11/05/2015 Document Reviewed: 12/28/2014 Elsevier Interactive Patient Education  [employer].

## 2017-04-25 NOTE — Progress Notes (Signed)
MEDICARE ANNUAL WELLNESS VISIT AND FOLLOW UP  Assessment:   Essential hypertension - continue medications, DASH diet, exercise and monitor at home. Call if greater than 130/80.  -     CBC with Differential/Platelet -     BASIC METABOLIC PANEL WITH GFR -     Hepatic function panel -     TSH  Controlled type 2 diabetes mellitus with diabetic nephropathy, without long-term current use of insulin (HCC) -     Hemoglobin A1c Discussed general issues about diabetes pathophysiology and management., Educational material distributed., Suggested low cholesterol diet., Encouraged aerobic exercise., Discussed foot care., Reminded to get yearly retinal exam.  Type 2 diabetes mellitus with stage 2 chronic kidney disease, without long-term current use of insulin (HCC) -     Hemoglobin A1c Discussed general issues about diabetes pathophysiology and management., Educational material distributed., Suggested low cholesterol diet., Encouraged aerobic exercise., Discussed foot care., Reminded to get yearly retinal exam.  Degenerative disc disease, lumbar Continue same  Primary osteoarthritis of both knees Continue same  Mixed hyperlipidemia -     Lipid panel - -continue medications, check lipids, decrease fatty foods, increase activity.   Non-Hodgkin's lymphoma, unspecified body region, unspecified non-Hodgkin lymphoma type ([place]) Monitor symptoms  Depression, major, recurrent, in partial remission ([place]) Get on wellbutrin  Fibromyalgia syndrome Continue medications  Medication management -     Magnesium  Vitamin D deficiency Continue supplement  Gastroesophageal reflux disease, esophagitis presence not specified Continue PPI/H2 blocker, diet discussed  Irritable bowel syndrome, unspecified type If not on benefiber then add it, decrease stress,  if any worsening symptoms, blood in stool, AB pain, etc call office  BMI 25.0-25.9,adult Monitor  Encounter for Medicare annual wellness exam 1  year GET MGM- given #  Over 30 minutes of exam, counseling, chart review, and critical decision making was performed  Future Appointments  Date Time Provider [place]  04/28/2017  3:00 PM [facility] [place] [facility]  07/27/2017  2:00 PM Unk [name], MD [facility] None  08/24/2017  1:15 PM Bo Merino, MD [facility] None    Plan:   During the course of the visit the patient was educated and counseled about appropriate screening and preventive services including:    Pneumococcal vaccine   Influenza vaccine  Td vaccine  Prevnar 13  Screening electrocardiogram  Screening mammography  Bone densitometry screening  Colorectal cancer screening  Diabetes screening  Glaucoma screening  Nutrition counseling   Advanced directives: given info/requested copies   Subjective:   Debra Short is a 76 y.o. female who presents for Medicare Annual Wellness Visit and 3 month follow up on hypertension, prediabetes, hyperlipidemia, vitamin D def.   She had recent admission for CAP in Oct, suppose to follow up with GI for dysphagia, was also started on medication for depression last visit. She never started the wellbutrin. She continues to have some SOB and weakness since her hospitalization.  Has seen Dr. Oletta Lamas recently, getting US thyroid Friday.   Her blood pressure has been controlled at home, today their BP is BP: 126/84 She does not workout. She denies chest pain, shortness of breath, dizziness. She will occasionally get some walking in around the house or do stretches but is not formally exercising.    She is not on cholesterol medication and denies myalgias. Her cholesterol is not at goal. The cholesterol last visit was:   Lab Results  Component Value Date   CHOL 144 01/12/2017   HDL 42 (L) 01/12/2017  LDLCALC 55 01/12/2017   TRIG 235 (H) 01/12/2017   CHOLHDL 3.4 01/12/2017   She has been working on diet.  She reports eating plenty of fruits and  veggies.  She does not check her sugars ever.    Lab Results  Component Value Date   HGBA1C 5.7 (H) 03/26/2017   Last GFR Lab Results  Component Value Date   GFRNONAA 80 04/05/2017   Patient is on Vitamin D supplement. Lab Results  Component Value Date   VD25OH 72 01/12/2017     BMI is Body mass index is 25.73 kg/m., she is working on diet and exercise. Wt Readings from Last 3 Encounters:  04/25/17 154 lb 9.6 oz (70.1 kg)  04/05/17 155 lb 6.4 oz (70.5 kg)  03/25/17 168 lb 6.9 oz (76.4 kg)     Medication Review Current Outpatient Medications on File Prior to Visit  Medication Sig Dispense Refill  . ALPRAZolam (XANAX) 1 MG tablet TAKE 1/2 TO 1 TABLET BY MOUTH TWO TO THREE TIMES DAILY AS NEEDED FOR ANXIETY 270 tablet 0  . aspirin EC 81 MG tablet Take 81 mg by mouth daily.    Marland Kitchen atorvastatin (LIPITOR) 80 MG tablet TAKE 1/3 TABLET BY MOUTH DAILY FOR CHOLESTEROL. DISCONTINUE PRAVASTATIN 30 tablet 2  . bisoprolol (ZEBETA) 5 MG tablet TAKE 1 TABLET EVERY MORNING FOR BLOOD PRESSURE 90 tablet 1  . buPROPion (WELLBUTRIN XL) 150 MG 24 hr tablet Take 1 tablet (150 mg total) by mouth every morning. 30 tablet 2  . Esomeprazole Magnesium (NEXIUM PO) Take 22.3 mg by mouth See admin instructions. Take 1 capsule (22.3 mg OTC) by mouth every morning before breakfast, may also take 1 capsule in the evening as needed for acid reflux    . FLUoxetine (PROZAC) 20 MG capsule Take 1 capsule (20 mg total) by mouth daily. 30 capsule 2  . fluticasone (FLONASE) 50 MCG/ACT nasal spray PLACE 2 SPRAYS INTO BOTH NOSTRILS DAILY. 48 g 1  . gabapentin (NEURONTIN) 300 MG capsule TAKE 2 CAPSULES TWICE A DAY (Patient taking differently: TAKE 2 CAPSULES AT BEDTIME) 360 capsule 1  . HYDROcodone-acetaminophen (NORCO/VICODIN) 5-325 MG tablet Take 1 tablet 2 (two) times daily as needed by mouth for moderate pain. 30 tablet 0  . metFORMIN (GLUCOPHAGE-XR) 500 MG 24 hr tablet TAKE 1 TABLET EVERY DAY WITH BREAKFAST 90 tablet 1  .  montelukast (SINGULAIR) 10 MG tablet TAKE 1 TABLET EVERY DAY AS NEEDED FOR ALLERGIES 90 tablet 1  . Multiple Vitamin (MULTIVITAMIN WITH MINERALS) TABS tablet Take 1 tablet by mouth daily.    . ondansetron (ZOFRAN) 4 MG tablet Take 1 tablet (4 mg total) by mouth every 8 (eight) hours as needed for nausea or vomiting. 60 tablet 1  . oxybutynin (DITROPAN) 5 MG tablet Take 5 mg by mouth 2 (two) times daily as needed for bladder spasms.     Marland Kitchen triamcinolone (NASACORT) 55 MCG/ACT AERO nasal inhaler Place 2 sprays into both nostrils daily as needed (for seasonal allergies).     . trimethoprim-polymyxin b (POLYTRIM) ophthalmic solution Place 1 drop into the right eye every 4 (four) hours. For 10 days. Dispense quantity sufficient. 10 mL 0   No current facility-administered medications on file prior to visit.     Allergies: Allergies  Allergen Reactions  . Effexor [Venlafaxine] Other (See Comments)    Not effective  . Nortriptyline Other (See Comments)    Not effective  . Paxil [Paroxetine Hcl] Other (See Comments)    Not effective  .  Robaxin [Methocarbamol] Itching  . Tizanidine Itching  . Zoloft [Sertraline Hcl] Other (See Comments)    Not effective    Current Problems (verified) has Hyperlipidemia; Hypertension; GERD (gastroesophageal reflux disease); Vitamin D deficiency; IBS (irritable bowel syndrome); Diabetes mellitus type 2, controlled ([place]); Non Hodgkin's lymphoma ([place]); Medication management; Insomnia; DM type 2 causing CKD stage 2 ([place]); Degenerative disc disease, lumbar; Primary osteoarthritis of both knees; Fibromyalgia syndrome; History of migraine; CAP (community acquired pneumonia); Hypoxia; and Depression, major, recurrent, in partial remission ([place]) on their problem list.  Screening Tests Immunization History  Administered Date(s) Administered  . Influenza Split 04/10/2013, 03/23/2016  . Influenza, High Dose Seasonal PF 03/24/2014, 02/27/2015  . Pneumococcal Conjugate-13  03/24/2014  . Pneumococcal Polysaccharide-23 06/13/2008  . Td 09/24/2012  . Tdap 09/26/2014  . Zoster 02/23/2011    Preventative care: Last colonoscopy: 2013 Last mammogram:  09/2014 WILL CALL TO SCHEDULE   Prior vaccinations: TD or Tdap: 2016  Influenza: 2016  Pneumococcal: 2010 Prevnar13: 2015 Shingles/Zostavax: 2012  Names of Other Physician/Practitioners you currently use: 1. [facility]- here for primary care 2. [facility], eye doctor, 2017 3. Dr. Orion Crook, dentist, last visit q53months 2018 Patient Care Team: Unk [name], MD as PCP - General (Internal Medicine) Bo Merino, MD as Consulting Physician (Rheumatology) [name], MD as Consulting Physician (Gastroenterology) Allyn Kenner, MD (Dermatology)  Surgical: She  has a past surgical history that includes ORIF ankle fracture (Left, 09/26/2014); Abdominal hysterectomy; Tonsillectomy; and OPEN REDUCTION INTERNAL FIXATION (ORIF) ANKLE FRACTURE (Left, 09/26/2014). Family Her family history includes Cancer in her brother, father, maternal grandfather, and mother. Social history  She reports that  has never smoked. she has never used smokeless tobacco. She reports that she does not drink alcohol or use drugs.  MEDICARE WELLNESS OBJECTIVES: Physical activity: Current Exercise Habits: The patient does not participate in regular exercise at present Cardiac risk factors: Cardiac Risk Factors include: advanced age (>5men, >48 women);dyslipidemia;hypertension;sedentary lifestyle Depression/mood screen:   Depression screen [facility] 2/9 04/25/2017  Decreased Interest 0  Down, Depressed, Hopeless 0  PHQ - 2 Score 0    ADLs:  In your present state of health, do you have any difficulty performing the following activities: 04/25/2017 03/25/2017  Hearing? N N  Vision? N N  Difficulty concentrating or making decisions? N N  Walking or climbing stairs? N N  Dressing or  bathing? N N  Doing errands, shopping? N N  Some recent data might be hidden     Cognitive Testing  Alert? Yes  Normal Appearance?Yes  Oriented to person? Yes  Place? Yes   Time? Yes  Recall of three objects?  Yes  Can perform simple calculations? Yes  Displays appropriate judgment?Yes  Can read the correct time from a watch face?Yes  EOL planning: Does Patient Have a Medical Advance Directive?: Yes Type of Advance Directive: Living will, Healthcare Power of [place] in Chart?: No - copy requested   Objective:   Today's Vitals   04/25/17 1124  BP: 126/84  Pulse: 78  Resp: 16  Temp: 98.6 F (37 C)  SpO2: 95%  Weight: 154 lb 9.6 oz (70.1 kg)  Height: 5\' 5"  (1.651 m)  PainSc: 8    Body mass index is 25.73 kg/m. Wt Readings from Last 3 Encounters:  04/25/17 154 lb 9.6 oz (70.1 kg)  04/05/17 155 lb 6.4 oz (70.5 kg)  03/25/17 168 lb 6.9 oz (76.4 kg)    General  appearance: alert, no distress, WD/WN,  female HEENT: normocephalic, sclerae anicteric, TMs pearly, nares patent, no discharge or erythema, pharynx normal Oral cavity: MMM, no lesions Neck: supple, no lymphadenopathy, no thyromegaly, + nodules on thyroid left side more than right, no masses Heart: RRR, normal S1, S2, no murmurs Lungs: CTA bilaterally, no wheezes, rhonchi, or rales Abdomen: +bs, soft, non tender, non distended, no masses, no hepatomegaly, no splenomegaly Musculoskeletal: nontender, no swelling, no obvious deformity Extremities: no edema, no cyanosis, no clubbing Pulses: 2+ symmetric, upper and lower extremities, normal cap refill Neurological: alert, oriented x 3, CN2-12 intact, strength normal upper extremities and lower extremities, sensation normal throughout, DTRs 2+ throughout, no cerebellar signs, gait normal Psychiatric: normal affect, behavior normal, pleasant  Breast: defer Gyn: defer Rectal: defer   Medicare Attestation I have personally  reviewed: The patient's medical and social history Their use of alcohol, tobacco or illicit drugs Their current medications and supplements The patient's functional ability including ADLs,fall risks, home safety risks, cognitive, and hearing and visual impairment Diet and physical activities Evidence for depression or mood disorders  The patient's weight, height, BMI, and visual acuity have been recorded in the chart.  I have made referrals, counseling, and provided education to the patient based on review of the above and I have provided the patient with a written personalized care plan for preventive services.     [name], PA-C   04/25/2017

## 2017-04-26 LAB — MAGNESIUM: Magnesium: 2.4 mg/dL (ref 1.5–2.5)

## 2017-04-26 LAB — HEPATIC FUNCTION PANEL
AG Ratio: 1.5 (calc) (ref 1.0–2.5)
ALT: 12 U/L (ref 6–29)
AST: 14 U/L (ref 10–35)
Albumin: 4.1 g/dL (ref 3.6–5.1)
Alkaline phosphatase (APISO): 59 U/L (ref 33–130)
Bilirubin, Direct: 0.2 mg/dL (ref 0.0–0.2)
Globulin: 2.7 g/dL (calc) (ref 1.9–3.7)
Indirect Bilirubin: 0.7 mg/dL (calc) (ref 0.2–1.2)
Total Bilirubin: 0.9 mg/dL (ref 0.2–1.2)
Total Protein: 6.8 g/dL (ref 6.1–8.1)

## 2017-04-26 LAB — LIPID PANEL
Cholesterol: 144 mg/dL (ref <200)
HDL: 56 mg/dL (ref >50)
LDL Cholesterol (Calc): 70 mg/dL (calc)
Non-HDL Cholesterol (Calc): 88 mg/dL (calc) (ref <130)
Total CHOL/HDL Ratio: 2.6 (calc) (ref <5.0)
Triglycerides: 101 mg/dL (ref <150)

## 2017-04-26 LAB — CBC WITH DIFFERENTIAL/PLATELET
Basophils Absolute: 48 cells/uL (ref 0–200)
Basophils Relative: 0.7 %
Eosinophils Absolute: 299 cells/uL (ref 15–500)
Eosinophils Relative: 4.4 %
HCT: 38.3 % (ref 35.0–45.0)
Hemoglobin: 12.4 g/dL (ref 11.7–15.5)
Lymphs Abs: 1462 cells/uL (ref 850–3900)
MCH: 30.2 pg (ref 27.0–33.0)
MCHC: 32.4 g/dL (ref 32.0–36.0)
MCV: 93.4 fL (ref 80.0–100.0)
MPV: 9.7 fL (ref 7.5–12.5)
Monocytes Relative: 11.8 %
Neutro Abs: 4189 cells/uL (ref 1500–7800)
Neutrophils Relative %: 61.6 %
Platelets: 237 10*3/uL (ref 140–400)
RBC: 4.1 10*6/uL (ref 3.80–5.10)
RDW: 12.2 % (ref 11.0–15.0)
Total Lymphocyte: 21.5 %
WBC mixed population: 802 cells/uL (ref 200–950)
WBC: 6.8 10*3/uL (ref 3.8–10.8)

## 2017-04-26 LAB — BASIC METABOLIC PANEL WITH GFR
BUN: 17 mg/dL (ref 7–25)
CO2: 34 mmol/L — ABNORMAL HIGH (ref 20–32)
Calcium: 9.5 mg/dL (ref 8.6–10.4)
Chloride: 101 mmol/L (ref 98–110)
Creat: 0.78 mg/dL (ref 0.60–0.93)
GFR, Est African American: 84 mL/min/{1.73_m2} (ref >=60)
GFR, Est Non African American: 73 mL/min/{1.73_m2} (ref >=60)
Glucose, Bld: 92 mg/dL (ref 65–99)
Potassium: 5.5 mmol/L — ABNORMAL HIGH (ref 3.5–5.3)
Sodium: 142 mmol/L (ref 135–146)

## 2017-04-26 LAB — HEMOGLOBIN A1C
Hgb A1c MFr Bld: 5.6 % of total Hgb (ref <5.7)
Mean Plasma Glucose: 114 (calc)
eAG (mmol/L): 6.3 (calc)

## 2017-04-26 LAB — TSH: TSH: 3.04 mIU/L (ref 0.40–4.50)

## 2017-04-26 NOTE — Progress Notes (Signed)
Pt aware of lab results & voiced understanding of those results. Pt is not taking a potassium pill at this time & understands that she is not to be taking.

## 2017-04-28 ENCOUNTER — Ambulatory Visit
Admission: RE | Admit: 2017-04-28 | Discharge: 2017-04-28 | Disposition: A | Discharge status: Home / Self Care | Payer: Medicare Other | Source: Ambulatory Visit | Attending: Gastroenterology | Admitting: Gastroenterology

## 2017-04-28 DIAGNOSIS — R1310 Dysphagia, unspecified: Secondary | ICD-10-CM

## 2017-05-02 ENCOUNTER — Other Ambulatory Visit: Payer: Self-pay | Admitting: Internal Medicine

## 2017-05-09 ENCOUNTER — Other Ambulatory Visit: Payer: Self-pay | Admitting: Internal Medicine

## 2017-05-09 DIAGNOSIS — F411 Generalized anxiety disorder: Secondary | ICD-10-CM

## 2017-05-17 DIAGNOSIS — Z800 Family history of malignant neoplasm of digestive organs: Secondary | ICD-10-CM | POA: Diagnosis not present

## 2017-05-17 DIAGNOSIS — J690 Pneumonitis due to inhalation of food and vomit: Secondary | ICD-10-CM | POA: Diagnosis not present

## 2017-05-17 DIAGNOSIS — R1310 Dysphagia, unspecified: Secondary | ICD-10-CM | POA: Diagnosis not present

## 2017-05-29 ENCOUNTER — Other Ambulatory Visit: Payer: Self-pay | Admitting: *Deleted

## 2017-05-29 MED ORDER — ATENOLOL 50 MG PO TABS: 50.0000 mg | ORAL_TABLET | Freq: Every day | ORAL | 1 refills | Status: DC

## 2017-06-20 ENCOUNTER — Other Ambulatory Visit: Payer: Self-pay | Admitting: Rheumatology

## 2017-06-20 DIAGNOSIS — Z5181 Encounter for therapeutic drug level monitoring: Secondary | ICD-10-CM | POA: Diagnosis not present

## 2017-06-20 MED ORDER — HYDROCODONE-ACETAMINOPHEN 5-325 MG PO TABS: 1.0000 | ORAL_TABLET | Freq: Two times a day (BID) | ORAL | 0 refills | Status: DC | PRN

## 2017-06-20 NOTE — Telephone Encounter (Signed)
ok 

## 2017-06-20 NOTE — Addendum Note (Signed)
Addended by: Carole Binning on: 06/20/2017 04:03 PM   Modules accepted: Orders

## 2017-06-20 NOTE — Telephone Encounter (Signed)
Patient is requesting a refill on her Hydrocodone Patient aware that she needs to update UDS.   Last Visit: 02/22/17 Next Visit: 08/24/16 UDS: 10/24/16 Narc Agreement: 04/19/17  Okay to refill Hydrocodone?

## 2017-06-20 NOTE — Telephone Encounter (Signed)
Patient left a voicemail requesting a prescription refill.  CB# [phone]

## 2017-06-26 LAB — PAIN MGMT, PROFILE 5 W/CONF, U
Alphahydroxyalprazolam: 211 ng/mL — ABNORMAL HIGH (ref <25)
Alphahydroxymidazolam: NEGATIVE ng/mL (ref <50)
Alphahydroxytriazolam: NEGATIVE ng/mL (ref <50)
Aminoclonazepam: NEGATIVE ng/mL (ref <25)
Amphetamines: NEGATIVE ng/mL (ref <500)
Barbiturates: NEGATIVE ng/mL (ref <300)
Benzodiazepines: POSITIVE ng/mL — AB (ref <100)
Cocaine Metabolite: NEGATIVE ng/mL (ref <150)
Codeine: NEGATIVE ng/mL (ref <50)
Creatinine: 105.6 mg/dL
Hydrocodone: 201 ng/mL — ABNORMAL HIGH (ref <50)
Hydromorphone: NEGATIVE ng/mL (ref <50)
Hydroxyethylflurazepam: NEGATIVE ng/mL (ref <50)
Lorazepam: NEGATIVE ng/mL (ref <50)
Marijuana Metabolite: NEGATIVE ng/mL (ref <20)
Methadone Metabolite: NEGATIVE ng/mL (ref <100)
Morphine: NEGATIVE ng/mL (ref <50)
Nordiazepam: NEGATIVE ng/mL (ref <50)
Norhydrocodone: 322 ng/mL — ABNORMAL HIGH (ref <50)
Opiates: POSITIVE ng/mL — AB (ref <100)
Oxazepam: NEGATIVE ng/mL (ref <50)
Oxidant: NEGATIVE ug/mL (ref <200)
Oxycodone: NEGATIVE ng/mL (ref <100)
Temazepam: NEGATIVE ng/mL (ref <50)
pH: 7.97 (ref 4.5–9.0)

## 2017-07-05 ENCOUNTER — Telehealth: Payer: Self-pay | Admitting: Rheumatology

## 2017-07-05 NOTE — Telephone Encounter (Signed)
Patient calling in reference to lt leg brace. VQ brace patient wants to get. Patient wore one on her rt leg about six years ago, but would like one for left. Please call patient to discuss.

## 2017-07-06 NOTE — Telephone Encounter (Signed)
PLease advise

## 2017-07-07 NOTE — Telephone Encounter (Signed)
Ok to do brace

## 2017-07-07 NOTE — Telephone Encounter (Signed)
Could not LVMOM , full. Not sure what she wants but I can leave a script for [facility] at [facility] if she calls back

## 2017-07-13 ENCOUNTER — Telehealth: Payer: Self-pay | Admitting: Rheumatology

## 2017-07-13 NOTE — Telephone Encounter (Signed)
Patient called to request a brace for her left leg.  She is still in a lot of pain and needs support when she walks.

## 2017-07-14 NOTE — Telephone Encounter (Signed)
Patient states she is having trouble with the left leg and it is wanting to "buckle on her". Patient states she is having swelling. Patient is having a lot of pain. Patient states the pain gets severe.  Patient states she had a brace for right leg in the 2011 and had really good results. Patient had a VQ [employer] brace. Patient would like to know if we can get her one for the left leg or is there another brace that she could use. Please advise.

## 2017-07-14 NOTE — Telephone Encounter (Signed)
Ok to apply for the left knee.

## 2017-07-18 ENCOUNTER — Encounter: Payer: Self-pay | Admitting: Internal Medicine

## 2017-07-27 ENCOUNTER — Ambulatory Visit (INDEPENDENT_AMBULATORY_CARE_PROVIDER_SITE_OTHER): Payer: Medicare Other | Admitting: Internal Medicine

## 2017-07-27 ENCOUNTER — Encounter: Payer: Self-pay | Admitting: Internal Medicine

## 2017-07-27 VITALS — BP 118/66 | HR 64 | Temp 97.2°F | Resp 16 | Ht 65.0 in | Wt 168.2 lb

## 2017-07-27 DIAGNOSIS — I700 Atherosclerosis of aorta: Secondary | ICD-10-CM | POA: Diagnosis not present

## 2017-07-27 DIAGNOSIS — K219 Gastro-esophageal reflux disease without esophagitis: Secondary | ICD-10-CM

## 2017-07-27 DIAGNOSIS — E782 Mixed hyperlipidemia: Secondary | ICD-10-CM

## 2017-07-27 DIAGNOSIS — I10 Essential (primary) hypertension: Secondary | ICD-10-CM

## 2017-07-27 DIAGNOSIS — Z1212 Encounter for screening for malignant neoplasm of rectum: Secondary | ICD-10-CM

## 2017-07-27 DIAGNOSIS — E1122 Type 2 diabetes mellitus with diabetic chronic kidney disease: Secondary | ICD-10-CM

## 2017-07-27 DIAGNOSIS — Z136 Encounter for screening for cardiovascular disorders: Secondary | ICD-10-CM

## 2017-07-27 DIAGNOSIS — N182 Chronic kidney disease, stage 2 (mild): Secondary | ICD-10-CM | POA: Diagnosis not present

## 2017-07-27 DIAGNOSIS — E559 Vitamin D deficiency, unspecified: Secondary | ICD-10-CM | POA: Diagnosis not present

## 2017-07-27 DIAGNOSIS — Z79899 Other long term (current) drug therapy: Secondary | ICD-10-CM | POA: Diagnosis not present

## 2017-07-27 DIAGNOSIS — Z1211 Encounter for screening for malignant neoplasm of colon: Secondary | ICD-10-CM

## 2017-07-27 MED ORDER — ATORVASTATIN CALCIUM 80 MG PO TABS: ORAL_TABLET | ORAL | 1 refills | Status: DC

## 2017-07-27 NOTE — Patient Instructions (Signed)

## 2017-07-27 NOTE — Telephone Encounter (Signed)
Patient advised the prescription is ready for her to pick up.

## 2017-07-27 NOTE — Progress Notes (Signed)
[facility] Unk [name], M.D.     Uvaldo Bristle. Silverio Lay, P.A.-C Liane Comber, [place] [facility] [address] [place] [address] Telephone [phone] Telefax [phone] Comprehensive Evaluation &  Examination     This very nice 77 y.o. [facility] presents for a  comprehensive evaluation and management of multiple medical co-morbidities.  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 Patient also has long hx/o Fibromyalgia syndrome with c/o pain in her limb/girdle musculature and is followed by Dr Estanislado Pandy.       HTN predates since 2012. Patient's BP has been controlled at home and patient denies any cardiac symptoms as chest pain, palpitations, shortness of breath, dizziness or ankle swelling. Today's BP is at goal - 118/66. Patient had Negative Cardiolite testing in 2008.     Patient's hyperlipidemia is controlled with diet and medications. Patient denies myalgias or other medication SE's. Last lipids were at goal: Lab Results  Component Value Date   CHOL 144 04/25/2017   HDL 56 04/25/2017   LDLCALC 55 01/12/2017   TRIG 101 04/25/2017   CHOLHDL 2.6 04/25/2017      Patient  Was dx'd PreDiabetic in 2009 and then T2_NIDDM in 2016 and she was initiated on Metformin. She does not monitor CBG's. Patient denies reactive hypoglycemic symptoms, visual blurring, diabetic polys, or paresthesias. Last A1c was at goal: Lab Results  Component Value Date   HGBA1C 5.6 04/25/2017      Finally, patient has history of Vitamin D Deficiency ("25"/2009) and last Vitamin D was at goal: Lab Results  Component Value Date   VD25OH 72 01/12/2017   Current Outpatient Medications on File Prior to Visit  Medication Sig  . ALPRAZolam (XANAX) 1 MG tablet Take 1/2 to 1 tablet 2 to 3 x / day only if needed for acute anxiety and please try to limit to 5 days /week to avoid addiction  . aspirin EC  81 MG tablet Take 81 mg by mouth daily.  Marland Kitchen atenolol (TENORMIN) 50 MG tablet Take 1 tablet (50 mg total) by mouth daily.  Marland Kitchen atorvastatin (LIPITOR) 80 MG tablet TAKE 1/3 TABLET BY MOUTH DAILY FOR CHOLESTEROL. DISCONTINUE PRAVASTATIN  . Esomeprazole Magnesium (NEXIUM PO) Take 22.3 mg by mouth See admin instructions. Take 1 capsule (22.3 mg OTC) by mouth every morning before breakfast, may also take 1 capsule in the evening as needed for acid reflux  . FLUoxetine (PROZAC) 40 MG capsule TAKE 1 CAPSULE BY MOUTH EVERY DAY  . fluticasone (FLONASE) 50 MCG/ACT nasal spray PLACE 2 SPRAYS INTO BOTH NOSTRILS DAILY.  Marland Kitchen gabapentin (NEURONTIN) 300 MG capsule TAKE 2 CAPSULES TWICE A DAY (Patient taking differently: TAKE 2 CAPSULES AT BEDTIME)  . HYDROcodone-acetaminophen (NORCO/VICODIN) 5-325 MG tablet Take 1 tablet by mouth 2 (two) times daily as needed for moderate pain.  . metFORMIN (GLUCOPHAGE-XR) 500 MG 24 hr tablet TAKE 1 TABLET EVERY DAY WITH BREAKFAST  . montelukast (SINGULAIR) 10 MG tablet TAKE 1 TABLET EVERY DAY AS NEEDED FOR ALLERGIES  . Multiple Vitamin (MULTIVITAMIN WITH MINERALS) TABS tablet Take 1 tablet by mouth daily.  . ondansetron (ZOFRAN) 4 MG tablet Take 1 tablet (4 mg total) by mouth every 8 (eight) hours as needed for nausea or vomiting.  Marland Kitchen oxybutynin (DITROPAN) 5 MG tablet Take 5 mg by mouth 2 (two) times daily as needed for bladder spasms.   Marland Kitchen triamcinolone (NASACORT) 55 MCG/ACT AERO nasal inhaler Place 2 sprays into both  nostrils daily as needed (for seasonal allergies).   . trimethoprim-polymyxin b (POLYTRIM) ophthalmic solution Place 1 drop into the right eye every 4 (four) hours. For 10 days. Dispense quantity sufficient.  Marland Kitchen buPROPion (WELLBUTRIN XL) 150 MG 24 hr tablet Take 1 tablet (150 mg total) by mouth every morning. (Patient not taking: Reported on 07/27/2017)   No current facility-administered medications on file prior to visit.    Allergies  Allergen Reactions  . Effexor  [Venlafaxine] Other (See Comments)    Not effective  . Nortriptyline Other (See Comments)    Not effective  . Paxil [Paroxetine Hcl] Other (See Comments)    Not effective  . Robaxin [Methocarbamol] Itching  . Tizanidine Itching  . Zoloft [Sertraline Hcl] Other (See Comments)    Not effective   Past Medical History:  Diagnosis Date  . Depression with anxiety 10/06/2014  . Fibromyalgia   . GERD (gastroesophageal reflux disease)   . Hyperlipidemia   . Hypertension   . IBS (irritable bowel syndrome)   . 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 Vitamin D deficiency    Health Maintenance  Topic Date Due  . DEXA SCAN  09/20/2003  . OPHTHALMOLOGY EXAM  04/12/2017  . URINE MICROALBUMIN  07/07/2017  . HEMOGLOBIN A1C  10/23/2017  . FOOT EXAM  04/25/2018  . TETANUS/TDAP  09/25/2024  . INFLUENZA VACCINE  Completed  . PNA vac Low Risk Adult  Completed   Immunization History  Administered Date(s) Administered  . Influenza Split 04/10/2013, 03/23/2016  . Influenza, High Dose Seasonal PF 03/24/2014, 02/27/2015  . Pneumococcal Conjugate-13 03/24/2014  . Pneumococcal Polysaccharide-23 06/13/2008  . Td 09/24/2012  . Tdap 09/26/2014  . Zoster 02/23/2011   Last Colon - 08/08/2011 - Dr Oletta Lamas and patient is overdue for Recc 5 yr f/u for (Feb 2018).  Last MGM- 09/24/2014 & pt advised to schedule both MGM  & dexa BMD.   Past Surgical History:  Procedure Laterality Date  . ABDOMINAL HYSTERECTOMY    . ORIF ANKLE FRACTURE Left 09/26/2014  . ORIF ANKLE FRACTURE Left 09/26/2014   Procedure: OPEN REDUCTION INTERNAL FIXATION (ORIF) ANKLE FRACTURE;  Surgeon: [name], MD;  Location: [facility];  Service: Orthopedics;  Laterality: Left;  . TONSILLECTOMY     Family History  Problem Relation Age of Onset  . Cancer Mother   . Cancer Father   . Cancer Brother   . Cancer Maternal Grandfather    Social History   Tobacco Use  . Smoking status: Never Smoker  . Smokeless tobacco: Never Used  Substance Use  Topics  . Alcohol use: No  . Drug use: No    ROS Constitutional: Denies fever, chills, weight loss/gain, headaches, insomnia,  night sweats, and change in appetite. Does c/o fatigue. Eyes: Denies redness, blurred vision, diplopia, discharge, itchy, watery eyes.  ENT: Denies discharge, congestion, post nasal drip, epistaxis, sore throat, earache, hearing loss, dental pain, Tinnitus, Vertigo, Sinus pain, snoring.  Cardio: Denies chest pain, palpitations, irregular heartbeat, syncope, dyspnea, diaphoresis, orthopnea, PND, claudication, edema Respiratory: denies cough, dyspnea, DOE, pleurisy, hoarseness, laryngitis, wheezing.  Gastrointestinal: Denies dysphagia, heartburn, reflux, water brash, pain, cramps, nausea, vomiting, bloating, diarrhea, constipation, hematemesis, melena, hematochezia, jaundice, hemorrhoids Genitourinary: Denies dysuria, frequency, urgency, nocturia, hesitancy, discharge, hematuria, flank pain Breast: Breast lumps, nipple discharge, bleeding.  Musculoskeletal: Denies arthralgia, myalgia, stiffness, Jt. Swelling, pain, limp, and strain/sprain. Denies falls. Skin: Denies puritis, rash, hives, warts, acne, eczema, changing in skin lesion Neuro: No weakness, tremor, incoordination, spasms, paresthesia, pain  Psychiatric: Denies confusion, memory loss, sensory loss. Denies Depression. Endocrine: Denies change in weight, skin, hair change, nocturia, and paresthesia, diabetic polys, visual blurring, hyper / hypo glycemic episodes.  Heme/Lymph: No excessive bleeding, bruising, enlarged lymph nodes.  Physical Exam  BP 118/66   Pulse 64   Temp (!) 97.2 F (36.2 C)   Resp 16   Ht 5\' 5"  (1.651 m)   Wt 168 lb 3.2 oz (76.3 kg)   BMI 27.99 kg/m   General Appearance: Well nourished, well groomed and in no apparent distress.  Eyes: PERRLA, EOMs, conjunctiva no swelling or erythema, normal fundi and vessels. Sinuses: No frontal/maxillary tenderness ENT/Mouth: EACs patent / TMs   nl. Nares clear without erythema, swelling, mucoid exudates. Oral hygiene is good. No erythema, swelling, or exudate. Tongue normal, non-obstructing. Tonsils not swollen or erythematous. Hearing normal.  Neck: Supple, thyroid not palpable. No bruits, nodes or JVD. Respiratory: Respiratory effort normal.  BS equal and clear bilateral without rales, rhonci, wheezing or stridor. Cardio: Heart sounds are normal with regular rate and rhythm and no murmurs, rubs or gallops. Peripheral pulses are normal and equal bilaterally without edema. No aortic or femoral bruits. Chest: symmetric with normal excursions and percussion. Breasts: Symmetric, without lumps, nipple discharge, retractions, or fibrocystic changes.  Abdomen: Flat, soft with bowel sounds active. Nontender, no guarding, rebound, hernias, masses, or organomegaly.  Lymphatics: Non tender without lymphadenopathy.  Musculoskeletal: Full ROM all peripheral extremities, joint stability, 5/5 strength, and normal gait. Skin: Warm and dry without rashes, lesions, cyanosis, clubbing or  ecchymosis.  Neuro: Cranial nerves intact, reflexes equal bilaterally. Normal muscle tone, no cerebellar symptoms. Sensation intact to touch, vibratory and Monofilament to the toes bilaterally. Pysch: Alert and oriented X 3, normal affect, Insight and Judgment appropriate.   Assessment and Plan  1. Essential hypertension  - EKG 12-Lead - Urinalysis, Routine w reflex microscopic - Microalbumin / creatinine urine ratio - CBC with Differential/Platelet - BASIC METABOLIC PANEL WITH GFR - Magnesium - TSH  2. Hyperlipidemia, mixed  - EKG 12-Lead - Hepatic function panel - Lipid panel - TSH  3. Type 2 diabetes mellitus with stage 2 chronic kidney disease, without long-term current use of insulin (HCC)  - EKG 12-Lead - Hemoglobin A1c - Insulin, random  4. Vitamin D deficiency  - VITAMIN D 25 Hydroxy  5. Gastroesophageal reflux disease  - CBC with  Differential/Platelet  6. Screening for colorectal cancer  - POC Hemoccult Bld/Stl   7. Screening for ischemic heart disease  - EKG 12-Lead  8. Aortic atherosclerosis (HCC)  - EKG 12-Lead  9. Medication management  - Urinalysis, Routine w reflex microscopic - Microalbumin / creatinine urine ratio - CBC with Differential/Platelet - BASIC METABOLIC PANEL WITH GFR - Hepatic function panel - Magnesium - Lipid panel - TSH - Hemoglobin A1c - Insulin, random - VITAMIN D 25 Hydroxy          Patient was counseled in prudent diet to achieve/maintain BMI less than 25 for weight control, BP monitoring, regular exercise and medications. Discussed med's effects and SE's. Screening labs and tests as requested with regular follow-up as recommended. Over 40 minutes of exam, counseling, chart review and high complex critical decision making was performed.

## 2017-07-28 LAB — URINALYSIS, ROUTINE W REFLEX MICROSCOPIC
Bilirubin Urine: NEGATIVE
Glucose, UA: NEGATIVE
Hgb urine dipstick: NEGATIVE
Hyaline Cast: NONE SEEN /LPF
Nitrite: NEGATIVE
Protein, ur: NEGATIVE
RBC / HPF: NONE SEEN /HPF (ref 0–2)
Specific Gravity, Urine: 1.022 (ref 1.001–1.03)
pH: 6.5 (ref 5.0–8.0)

## 2017-07-28 LAB — HEPATIC FUNCTION PANEL
AG Ratio: 1.6 (calc) (ref 1.0–2.5)
ALT: 21 U/L (ref 6–29)
AST: 23 U/L (ref 10–35)
Albumin: 4.2 g/dL (ref 3.6–5.1)
Alkaline phosphatase (APISO): 60 U/L (ref 33–130)
Bilirubin, Direct: 0.2 mg/dL (ref 0.0–0.2)
Globulin: 2.7 g/dL (calc) (ref 1.9–3.7)
Indirect Bilirubin: 0.6 mg/dL (calc) (ref 0.2–1.2)
Total Bilirubin: 0.8 mg/dL (ref 0.2–1.2)
Total Protein: 6.9 g/dL (ref 6.1–8.1)

## 2017-07-28 LAB — BASIC METABOLIC PANEL WITH GFR
BUN: 18 mg/dL (ref 7–25)
CO2: 33 mmol/L — ABNORMAL HIGH (ref 20–32)
Calcium: 9.7 mg/dL (ref 8.6–10.4)
Chloride: 99 mmol/L (ref 98–110)
Creat: 0.79 mg/dL (ref 0.60–0.93)
GFR, Est African American: 83 mL/min/{1.73_m2} (ref >=60)
GFR, Est Non African American: 72 mL/min/{1.73_m2} (ref >=60)
Glucose, Bld: 99 mg/dL (ref 65–99)
Potassium: 4.8 mmol/L (ref 3.5–5.3)
Sodium: 139 mmol/L (ref 135–146)

## 2017-07-28 LAB — CBC WITH DIFFERENTIAL/PLATELET
Basophils Absolute: 47 cells/uL (ref 0–200)
Basophils Relative: 0.6 %
Eosinophils Absolute: 211 cells/uL (ref 15–500)
Eosinophils Relative: 2.7 %
HCT: 39.6 % (ref 35.0–45.0)
Hemoglobin: 12.9 g/dL (ref 11.7–15.5)
Lymphs Abs: 1630 cells/uL (ref 850–3900)
MCH: 29.5 pg (ref 27.0–33.0)
MCHC: 32.6 g/dL (ref 32.0–36.0)
MCV: 90.4 fL (ref 80.0–100.0)
MPV: 9.8 fL (ref 7.5–12.5)
Monocytes Relative: 9.1 %
Neutro Abs: 5203 cells/uL (ref 1500–7800)
Neutrophils Relative %: 66.7 %
Platelets: 320 10*3/uL (ref 140–400)
RBC: 4.38 10*6/uL (ref 3.80–5.10)
RDW: 12.7 % (ref 11.0–15.0)
Total Lymphocyte: 20.9 %
WBC mixed population: 710 cells/uL (ref 200–950)
WBC: 7.8 10*3/uL (ref 3.8–10.8)

## 2017-07-28 LAB — MICROALBUMIN / CREATININE URINE RATIO
Creatinine, Urine: 220 mg/dL (ref 20–275)
Microalb Creat Ratio: 9 mcg/mg creat (ref <30)
Microalb, Ur: 1.9 mg/dL

## 2017-07-28 LAB — LIPID PANEL
Cholesterol: 146 mg/dL (ref <200)
HDL: 51 mg/dL (ref >50)
LDL Cholesterol (Calc): 74 mg/dL (calc)
Non-HDL Cholesterol (Calc): 95 mg/dL (calc) (ref <130)
Total CHOL/HDL Ratio: 2.9 (calc) (ref <5.0)
Triglycerides: 126 mg/dL (ref <150)

## 2017-07-28 LAB — HEMOGLOBIN A1C
Hgb A1c MFr Bld: 6.1 % of total Hgb — ABNORMAL HIGH (ref <5.7)
Mean Plasma Glucose: 128 (calc)
eAG (mmol/L): 7.1 (calc)

## 2017-07-28 LAB — TSH: TSH: 2.31 mIU/L (ref 0.40–4.50)

## 2017-07-28 LAB — MAGNESIUM: Magnesium: 2.2 mg/dL (ref 1.5–2.5)

## 2017-07-28 LAB — VITAMIN D 25 HYDROXY (VIT D DEFICIENCY, FRACTURES): Vit D, 25-Hydroxy: 76 ng/mL (ref 30–100)

## 2017-07-28 LAB — INSULIN, RANDOM: Insulin: 23.8 u[IU]/mL — ABNORMAL HIGH (ref 2.0–19.6)

## 2017-08-10 ENCOUNTER — Encounter: Payer: Self-pay | Admitting: Internal Medicine

## 2017-08-11 NOTE — Progress Notes (Deleted)
Office Visit Note  Patient: Debra Short             Date of Birth: 1940-12-20           MRN: [id number]             PCP: Unk [name], MD Referring: Unk [name], MD Visit Date: 08/24/2017 Occupation: @GUAROCC @    Subjective:  No chief complaint on file.   History of Present Illness: Debra Short is a 77 y.o. female ***   Activities of Daily Living:  Patient reports morning stiffness for *** {minute/hour:19697}.   Patient {ACTIONS;DENIES/REPORTS:21021675::"Denies"} nocturnal pain.  Difficulty dressing/grooming: {ACTIONS;DENIES/REPORTS:21021675::"Denies"} Difficulty climbing stairs: {ACTIONS;DENIES/REPORTS:21021675::"Denies"} Difficulty getting out of chair: {ACTIONS;DENIES/REPORTS:21021675::"Denies"} Difficulty using hands for taps, buttons, cutlery, and/or writing: {ACTIONS;DENIES/REPORTS:21021675::"Denies"}   No Rheumatology ROS completed.   PMFS History:  Patient Active Problem List   Diagnosis Date Noted  . Depression, major, recurrent, in partial remission ([place]) 04/25/2017  . Estrogen deficiency 04/25/2017  . Hypoxia   . CAP (community acquired pneumonia) 03/25/2017  . History of migraine 08/25/2016  . Degenerative disc disease, lumbar 07/20/2016  . Primary osteoarthritis of both knees 07/20/2016  . Fibromyalgia syndrome 07/20/2016  . DM type 2 causing CKD stage 2 ([place]) 10/04/2015  . Insomnia 10/06/2014  . Medication management 10/09/2013  . Hyperlipidemia   . Hypertension   . GERD (gastroesophageal reflux disease)   . Vitamin D deficiency   . IBS (irritable bowel syndrome)   . Diabetes mellitus type 2, controlled ([place])   . Non Hodgkin's lymphoma ([place])     Past Medical History:  Diagnosis Date  . Depression with anxiety 10/06/2014  . Fibromyalgia   . GERD (gastroesophageal reflux disease)   . Hyperlipidemia   . Hypertension   . IBS (irritable bowel syndrome)   . 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 Vitamin D deficiency     Family History    Problem Relation Age of Onset  . Cancer Mother   . Cancer Father   . Cancer Brother   . Cancer Maternal Grandfather    Past Surgical History:  Procedure Laterality Date  . ABDOMINAL HYSTERECTOMY    . ORIF ANKLE FRACTURE Left 09/26/2014  . ORIF ANKLE FRACTURE Left 09/26/2014   Procedure: OPEN REDUCTION INTERNAL FIXATION (ORIF) ANKLE FRACTURE;  Surgeon: [name], MD;  Location: [facility];  Service: Orthopedics;  Laterality: Left;  . TONSILLECTOMY     Social History   Social History Narrative  . Not on file     Objective: Vital Signs: There were no vitals taken for this visit.   Physical Exam   Musculoskeletal Exam: ***  CDAI Exam: No CDAI exam completed.    Investigation: No additional findings. CBC Latest Ref Rng & Units 07/27/2017 04/25/2017 04/05/2017  WBC 3.8 - 10.8 Thousand/uL 7.8 6.8 11.5(H)  Hemoglobin 11.7 - 15.5 g/dL 12.9 12.4 13.0  Hematocrit 35.0 - 45.0 % 39.6 38.3 38.6  Platelets 140 - 400 Thousand/uL 320 237 413(H)   CMP Latest Ref Rng & Units 07/27/2017 04/25/2017 04/05/2017  Glucose 65 - 99 mg/dL 99 92 87  BUN 7 - 25 mg/dL 18 17 19   Creatinine 0.60 - 0.93 mg/dL 0.79 0.78 0.72  Sodium 135 - 146 mmol/L 139 142 142  Potassium 3.5 - 5.3 mmol/L 4.8 5.5(H) 5.7(H)  Chloride 98 - 110 mmol/L 99 101 101  CO2 20 - 32 mmol/L 33(H) 34(H) 32  Calcium 8.6 - 10.4 mg/dL 9.7 9.5 9.9  Total Protein 6.1 - 8.1  g/dL 6.9 6.8 -  Total Bilirubin 0.2 - 1.2 mg/dL 0.8 0.9 -  Alkaline Phos 38 - 126 U/L - - -  AST 10 - 35 U/L 23 14 -  ALT 6 - 29 U/L 21 12 -    Imaging: No results found.  Speciality Comments: No specialty comments available.    Procedures:  No procedures performed Allergies: Effexor [venlafaxine]; Nortriptyline; Paxil [paroxetine hcl]; Robaxin [methocarbamol]; Tizanidine; and Zoloft [sertraline hcl]   Assessment / Plan:     Visit Diagnoses: No diagnosis found.    Orders: No orders of the defined types were placed in this encounter.  No orders of  the defined types were placed in this encounter.   Face-to-face time spent with patient was *** minutes. 50% of time was spent in counseling and coordination of care.  Follow-Up Instructions: No Follow-up on file.   Earnestine Mealing, CMA  Note - This record has been created using Editor, commissioning.  Chart creation errors have been sought, but may not always  have been located. Such creation errors do not reflect on  the standard of medical care.

## 2017-08-15 ENCOUNTER — Telehealth: Payer: Self-pay | Admitting: Rheumatology

## 2017-08-15 MED ORDER — HYDROCODONE-ACETAMINOPHEN 5-325 MG PO TABS: 1.0000 | ORAL_TABLET | Freq: Two times a day (BID) | ORAL | 0 refills | Status: DC | PRN

## 2017-08-15 NOTE — Telephone Encounter (Signed)
Last Visit: 02/22/17 Next Visit: 08/24/16  UDS: 06/20/17 Narc Agreement: 04/19/17  Okay to refill Hydrocodone?

## 2017-08-15 NOTE — Telephone Encounter (Signed)
Patient called requesting prescription refill of Hydrocodone.  Patient's pharmacy is [facility] on [address].

## 2017-08-22 ENCOUNTER — Encounter: Payer: Self-pay | Admitting: *Deleted

## 2017-08-22 DIAGNOSIS — H52203 Unspecified astigmatism, bilateral: Secondary | ICD-10-CM | POA: Diagnosis not present

## 2017-08-22 DIAGNOSIS — E119 Type 2 diabetes mellitus without complications: Secondary | ICD-10-CM | POA: Diagnosis not present

## 2017-08-22 DIAGNOSIS — Z961 Presence of intraocular lens: Secondary | ICD-10-CM | POA: Diagnosis not present

## 2017-08-22 LAB — HM DIABETES EYE EXAM

## 2017-08-24 ENCOUNTER — Encounter: Payer: Self-pay | Admitting: *Deleted

## 2017-08-24 ENCOUNTER — Telehealth (INDEPENDENT_AMBULATORY_CARE_PROVIDER_SITE_OTHER): Payer: Self-pay | Admitting: Rheumatology

## 2017-08-24 ENCOUNTER — Ambulatory Visit: Payer: Self-pay | Admitting: Rheumatology

## 2017-08-24 NOTE — Telephone Encounter (Signed)
[employer] had requested ov note for DOS 07/27/17 to support need for a Rx for VQ Brace written 07/27/17. Patient was not seen that date, only the rx was written, I advised [employer] of this

## 2017-08-27 ENCOUNTER — Other Ambulatory Visit: Payer: Self-pay | Admitting: Internal Medicine

## 2017-08-27 DIAGNOSIS — G894 Chronic pain syndrome: Secondary | ICD-10-CM

## 2017-09-01 NOTE — Telephone Encounter (Signed)
02/22/2017 (LAST OV NOTE) OV NOTE FAXED TO Alison Stalling [phone]

## 2017-10-03 ENCOUNTER — Other Ambulatory Visit: Payer: Self-pay | Admitting: Physician Assistant

## 2017-10-03 DIAGNOSIS — Z1231 Encounter for screening mammogram for malignant neoplasm of breast: Secondary | ICD-10-CM

## 2017-10-08 ENCOUNTER — Other Ambulatory Visit: Payer: Self-pay | Admitting: Internal Medicine

## 2017-10-10 NOTE — Progress Notes (Addendum)
Office Visit Note  Patient: Debra Short             Date of Birth: 06/18/1940           MRN: [id number]             PCP: Unk [name], MD Referring: Unk [name], MD Visit Date: 10/11/2017 Occupation: @GUAROCC @    Subjective:  Left knee pain   History of Present Illness: Debra Short is a 77 y.o. female with history of fibromyalgia, osteoarthritis, and DDD.  She states that she has been having increased generalized muscle tenderness and muscle aches.  She states that she continues to have chronic fatigue and insomnia.  She states that she has been falling more frequently.  She states she fell about 6 weeks ago and fell on her right knee but she still bruised.  She has been having increased pain and swelling in her left knee.  She continues to have pain in her bilateral hands and bilateral feet.  She states she also has swelling in her hands as well.  She states that over the past 2 months she has been very inactive and not being able to do the exercises that she wants to do.  She states she feels like she has a lot of muscle weakness which is leading to increased falls.  She states that previously she was given a referral for physical therapy to work on gait instability but has not attended it because she is worried that she is too weak.  She states that her PCP took her off of Celebrex and Ambien which has been increasing her pain and insomnia.  She says that she has been taking Tylenol arthritis for pain relief as well as Norco.   Activities of Daily Living:  Patient reports morning stiffness for 20-30 minutes.   Patient Reports nocturnal pain.  Difficulty dressing/grooming: Denies Difficulty climbing stairs: Reports Difficulty getting out of chair: Reports Difficulty using hands for taps, buttons, cutlery, and/or writing: Denies   Review of Systems  Constitutional: Positive for fatigue.  HENT: Positive for mouth dryness. Negative for mouth sores and nose dryness.     Eyes: Negative for pain, visual disturbance and dryness.  Respiratory: Negative for cough, hemoptysis, shortness of breath and difficulty breathing.   Cardiovascular: Negative for chest pain, palpitations, hypertension and swelling in legs/feet.  Gastrointestinal: Negative for blood in stool, constipation and diarrhea.  Endocrine: Negative for increased urination.  Genitourinary: Negative for painful urination.  Musculoskeletal: Positive for arthralgias, joint pain, joint swelling, myalgias, muscle weakness, morning stiffness, muscle tenderness and myalgias.  Skin: Negative for color change, pallor, rash, hair loss, nodules/bumps, skin tightness, ulcers and sensitivity to sunlight.  Allergic/Immunologic: Negative for susceptible to infections.  Neurological: Negative for dizziness, numbness, headaches and weakness.  Hematological: Negative for swollen glands.  Psychiatric/Behavioral: Positive for depressed mood and sleep disturbance. The patient is nervous/anxious.     PMFS History:  Patient Active Problem List   Diagnosis Date Noted  . Depression, major, recurrent, in partial remission ([place]) 04/25/2017  . Estrogen deficiency 04/25/2017  . Hypoxia   . CAP (community acquired pneumonia) 03/25/2017  . History of migraine 08/25/2016  . Degenerative disc disease, lumbar 07/20/2016  . Primary osteoarthritis of both knees 07/20/2016  . Fibromyalgia syndrome 07/20/2016  . 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 Insomnia 10/06/2014  . Medication management 10/09/2013  . Hyperlipidemia   . Hypertension   . GERD (gastroesophageal reflux disease)   .  Vitamin D deficiency   . IBS (irritable bowel syndrome)   . Diabetes mellitus type 2, controlled ([place])   . Non Hodgkin's lymphoma ([place])     Past Medical History:  Diagnosis Date  . Depression with anxiety 10/06/2014  . Fibromyalgia   . GERD (gastroesophageal reflux disease)   . Hyperlipidemia   . Hypertension   . IBS (irritable  bowel syndrome)   . 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 Vitamin D deficiency     Family History  Problem Relation Age of Onset  . Cancer Mother   . Cancer Father   . Cancer Brother   . Cancer Maternal Grandfather    Past Surgical History:  Procedure Laterality Date  . ABDOMINAL HYSTERECTOMY    . ORIF ANKLE FRACTURE Left 09/26/2014  . ORIF ANKLE FRACTURE Left 09/26/2014   Procedure: OPEN REDUCTION INTERNAL FIXATION (ORIF) ANKLE FRACTURE;  Surgeon: [name], MD;  Location: [facility];  Service: Orthopedics;  Laterality: Left;  . TONSILLECTOMY     Social History   Social History Narrative  . Not on file     Objective: Vital Signs: BP 135/76 (BP Location: Left Arm, Patient Position: Sitting, Cuff Size: Normal)   Pulse 73   Resp 16   Ht 5\' 5"  (1.651 m)   Wt 168 lb (76.2 kg)   BMI 27.96 kg/m    Physical Exam  Constitutional: She is oriented to person, place, and time. She appears well-developed and well-nourished.  HENT:  Head: Normocephalic and atraumatic.  Eyes: Conjunctivae and EOM are normal.  Neck: Normal range of motion.  Cardiovascular: Normal rate, regular rhythm, normal heart sounds and intact distal pulses.  Pulmonary/Chest: Effort normal and breath sounds normal.  Abdominal: Soft. Bowel sounds are normal.  Lymphadenopathy:    She has no cervical adenopathy.  Neurological: She is alert and oriented to person, place, and time.  Skin: Skin is warm and dry. Capillary refill takes less than 2 seconds.  Psychiatric: She has a normal mood and affect. Her behavior is normal.  Nursing note and vitals reviewed.    Musculoskeletal Exam: C-spine limited range of motion.  She has limited range of motion of thoracic and lumbar spine.  She has painful range of motion of her right shoulder.  Elbow joints, wrist joints, MCPs, PIPs, DIPs good ROM.  90% fist formation bilaterally.  She has PIP and DIP synovial thickening consistent with osteoarthritis of bilateral hands.  Hip joints, knee  joints, ankle joints, MTPs, PIPs, DIPs good range of motion no synovitis.  She has warmth of her left knee and discomfort with range of motion.  Right knee no warmth or effusion.  She is good range of motion of her right knee.  Bilateral knee crepitus.  No tenderness of trochanteric bursa bilaterally.  CDAI Exam: No CDAI exam completed.    Investigation: No additional findings. CBC Latest Ref Rng & Units 07/27/2017 04/25/2017 04/05/2017  WBC 3.8 - 10.8 Thousand/uL 7.8 6.8 11.5(H)  Hemoglobin 11.7 - 15.5 g/dL 12.9 12.4 13.0  Hematocrit 35.0 - 45.0 % 39.6 38.3 38.6  Platelets 140 - 400 Thousand/uL 320 237 413(H)   CMP Latest Ref Rng & Units 07/27/2017 04/25/2017 04/05/2017  Glucose 65 - 99 mg/dL 99 92 87  BUN 7 - 25 mg/dL 18 17 19   Creatinine 0.60 - 0.93 mg/dL 0.79 0.78 0.72  Sodium 135 - 146 mmol/L 139 142 142  Potassium 3.5 - 5.3 mmol/L 4.8 5.5(H) 5.7(H)  Chloride 98 - 110 mmol/L 99  101 101  CO2 20 - 32 mmol/L 33(H) 34(H) 32  Calcium 8.6 - 10.4 mg/dL 9.7 9.5 9.9  Total Protein 6.1 - 8.1 g/dL 6.9 6.8 -  Total Bilirubin 0.2 - 1.2 mg/dL 0.8 0.9 -  Alkaline Phos 38 - 126 U/L - - -  AST 10 - 35 U/L 23 14 -  ALT 6 - 29 U/L 21 12 -    Imaging: Xr Knee 3 View Left  Result Date: 10/11/2017 Mild medial compartment joint space narrowing.  Moderate patellofemoral joint space narrowing.  No chondrocalcinosis noted. Impression: Findings are consistent with mild osteoarthritis and moderate chondromalacia patella.    Speciality Comments: No specialty comments available.    Procedures:  No procedures performed Allergies: Effexor [venlafaxine]; Nortriptyline; Paxil [paroxetine hcl]; Robaxin [methocarbamol]; Tizanidine; and Zoloft [sertraline hcl]   Assessment / Plan:     Visit Diagnoses: Fibromyalgia: She has been having increased generalized muscle tender muscle tension over the past few months.  She is also having worsening fatigue and insomnia.  Her PCP took her off Ambien which has been  leading to more interrupted night sleep.  Good sleep hygiene was discussed.  She continues to take Norco and Tylenol arthritis for pain relief.  She has been falling more frequently due to muscle weakness and gait instability.  She was encouraged to attend physical therapy.  She was given a referral at her last visit.  She was encouraged to continue to work on strengthening and balance.  She plans on trying to start to exercise on a regular basis.  Primary osteoarthritis of both hands: She has significant PIP and DIP synovial thickening consistent with osteoarthritis of both hands.  Joint protection muscle strengthening were discussed.  She was given a handout of hand exercises that she can perform at home.  Primary osteoarthritis of both knees: She has discomfort in her left knee at this time.  She has mild warmth and swelling in her left knee.  An x-ray of her left knee was obtained today.  X-ray of her left knee revealed mild osteoarthritis and moderate chondromalacia patella.  She fell on her right knee about 6 weeks ago and continues to have a bruise.  She has bilateral knee crepitus.  Chronic pain of left knee -she is been having increased discomfort and swelling in her left knee.  She has warmth and a mild effusion on exam today.  She has knee crepitus in bilateral knees.  An x-ray of her left knee was obtained today.  She is given a handout on knee exercises that she can perform at home.  She is encouraged to follow-up with physical therapy to work on strengthening and gait stability.  Plan: XR KNEE 3 VIEW LEFT   Instability joints of both ankles: She has been having more frequent falls she attributes to muscle weakness and instability of her bilateral ankles.  Referral for physical therapy was placed today.  DDD (degenerative disc disease), lumbar: No midline spinal tenderness at this time.  Imaging range of motion.  Other chronic pain - hydrocodone. UDS: 1/8/2019Narc agreement: 04/19/2017.  She  is going to start tapering off of her hydrocodone.  She is going to take a fourth of a tablet for 1 week and the following week she will alternate taking 1/4 tablet every other day.  We will give her a refill of hydrocodone 10 tablets.  We will no longer be refilling hydrocodone after this refill.  We discussed the fall risk of taking hydrocodone.  Other  medical conditions are listed as follows:  History of non-Hodgkin's lymphoma  History of diabetes mellitus  History of migraine  History of hearing loss - left ear  History of hypercholesterolemia  History of depression   Orders: Orders Placed This Encounter  Procedures  . XR KNEE 3 VIEW LEFT   No orders of the defined types were placed in this encounter.   Face-to-face time spent with patient was 30 minutes.> 50% of time was spent in counseling and coordination of care.  Follow-Up Instructions: Return in about 6 months (around 04/13/2018) for Fibromyalgia, Osteoarthritis, DDD.   Ofilia Neas, PA-C   I examined and evaluated the patient with Hazel Sams PA. Patient had no synovitis on her exam. Xray findings were discussed.The plan of care was discussed as noted above.  Bo Merino, MD  Note - This record has been created using Editor, commissioning.  Chart creation errors have been sought, but may not always  have been located. Such creation errors do not reflect on  the standard of medical care.

## 2017-10-11 ENCOUNTER — Ambulatory Visit (INDEPENDENT_AMBULATORY_CARE_PROVIDER_SITE_OTHER): Payer: Medicare Other

## 2017-10-11 ENCOUNTER — Ambulatory Visit (INDEPENDENT_AMBULATORY_CARE_PROVIDER_SITE_OTHER): Payer: Medicare Other | Admitting: Rheumatology

## 2017-10-11 ENCOUNTER — Encounter: Payer: Self-pay | Admitting: Rheumatology

## 2017-10-11 VITALS — BP 135/76 | HR 73 | Resp 16 | Ht 65.0 in | Wt 168.0 lb

## 2017-10-11 DIAGNOSIS — M25562 Pain in left knee: Secondary | ICD-10-CM | POA: Diagnosis not present

## 2017-10-11 DIAGNOSIS — Z8639 Personal history of other endocrine, nutritional and metabolic disease: Secondary | ICD-10-CM | POA: Diagnosis not present

## 2017-10-11 DIAGNOSIS — M797 Fibromyalgia: Secondary | ICD-10-CM | POA: Diagnosis not present

## 2017-10-11 DIAGNOSIS — G8929 Other chronic pain: Secondary | ICD-10-CM

## 2017-10-11 DIAGNOSIS — M19042 Primary osteoarthritis, left hand: Secondary | ICD-10-CM

## 2017-10-11 DIAGNOSIS — M19041 Primary osteoarthritis, right hand: Secondary | ICD-10-CM

## 2017-10-11 DIAGNOSIS — M5136 Other intervertebral disc degeneration, lumbar region: Secondary | ICD-10-CM | POA: Diagnosis not present

## 2017-10-11 DIAGNOSIS — R29898 Other symptoms and signs involving the musculoskeletal system: Secondary | ICD-10-CM

## 2017-10-11 DIAGNOSIS — M25371 Other instability, right ankle: Secondary | ICD-10-CM

## 2017-10-11 DIAGNOSIS — Z8659 Personal history of other mental and behavioral disorders: Secondary | ICD-10-CM | POA: Diagnosis not present

## 2017-10-11 DIAGNOSIS — M170 Bilateral primary osteoarthritis of knee: Secondary | ICD-10-CM

## 2017-10-11 DIAGNOSIS — Z8669 Personal history of other diseases of the nervous system and sense organs: Secondary | ICD-10-CM | POA: Diagnosis not present

## 2017-10-11 DIAGNOSIS — M25372 Other instability, left ankle: Secondary | ICD-10-CM | POA: Diagnosis not present

## 2017-10-11 DIAGNOSIS — Z8572 Personal history of non-Hodgkin lymphomas: Secondary | ICD-10-CM | POA: Diagnosis not present

## 2017-10-11 MED ORDER — HYDROCODONE-ACETAMINOPHEN 5-325 MG PO TABS: 0.5000 | ORAL_TABLET | Freq: Every day | ORAL | 0 refills | Status: DC

## 2017-10-11 NOTE — Patient Instructions (Addendum)
Hand Exercises Hand exercises can be helpful to almost anyone. These exercises can strengthen the hands, improve flexibility and movement, and increase blood flow to the hands. These results can make work and daily tasks easier. Hand exercises can be especially helpful for people who have joint pain from arthritis or have nerve damage from overuse (carpal tunnel syndrome). These exercises can also help people who have injured a hand. Most of these hand exercises are fairly gentle stretching routines. You can do them often throughout the day. Still, it is a good idea to ask your health care provider which exercises would be best for you. Warming your hands before exercise may help to reduce stiffness. You can do this with gentle massage or by placing your hands in warm water for 15 minutes. Also, make sure you pay attention to your level of hand pain as you begin an exercise routine. Exercises Knuckle Bend Repeat this exercise 5-10 times with each hand. 1. Stand or sit with your arm, hand, and all five fingers pointed straight up. Make sure your wrist is straight. 2. Gently and slowly bend your fingers down and inward until the tips of your fingers are touching the tops of your palm. 3. Hold this position for a few seconds. 4. Extend your fingers out to their original position, all pointing straight up again.  Finger Fan Repeat this exercise 5-10 times with each hand. 1. Hold your arm and hand out in front of you. Keep your wrist straight. 2. Squeeze your hand into a fist. 3. Hold this position for a few seconds. 4. Fan out, or spread apart, your hand and fingers as much as possible, stretching every joint fully.  Tabletop Repeat this exercise 5-10 times with each hand. 1. Stand or sit with your arm, hand, and all five fingers pointed straight up. Make sure your wrist is straight. 2. Gently and slowly bend your fingers at the knuckles where they meet the hand until your hand is making an  upside-down L shape. Your fingers should form a tabletop. 3. Hold this position for a few seconds. 4. Extend your fingers out to their original position, all pointing straight up again.  Making Os Repeat this exercise 5-10 times with each hand. 1. Stand or sit with your arm, hand, and all five fingers pointed straight up. Make sure your wrist is straight. 2. Make an O shape by touching your pointer finger to your thumb. Hold for a few seconds. Then open your hand wide. 3. Repeat this motion with each finger on your hand.  Table Spread Repeat this exercise 5-10 times with each hand. 1. Place your hand on a table with your palm facing down. Make sure your wrist is straight. 2. Spread your fingers out as much as possible. Hold this position for a few seconds. 3. Slide your fingers back together again. Hold for a few seconds.  Ball Grip  Repeat this exercise 10-15 times with each hand. 1. Hold a tennis ball or another soft ball in your hand. 2. While slowly increasing pressure, squeeze the ball as hard as possible. 3. Squeeze as hard as you can for 3-5 seconds. 4. Relax and repeat.  Wrist Curls Repeat this exercise 10-15 times with each hand. 1. Sit in a chair that has armrests. 2. Hold a light weight in your hand, such as a dumbbell that weighs 1-3 pounds (0.5-1.4 kg). Ask your health care provider what weight would be best for you. 3. Rest your hand just over   the end of the chair arm with your palm facing up. 4. Gently pivot your wrist up and down while holding the weight. Do not twist your wrist from side to side.  Contact a health care provider if:  Your hand pain or discomfort gets much worse when you do an exercise.  Your hand pain or discomfort does not improve within 2 hours after you exercise. If you have any of these problems, stop doing these exercises right away. Do not do them again unless your health care provider says that you can. Get help right away if:  You  develop sudden, severe hand pain. If this happens, stop doing these exercises right away. Do not do them again unless your health care provider says that you can. This information is not intended to replace advice given to you by your health care provider. Make sure you discuss any questions you have with your health care provider. Document Released: 05/11/2015 Document Revised: 11/05/2015 Document Reviewed: 12/08/2014 Elsevier Interactive Patient Education  2018 [employer] Knee Exercises Ask your health care provider which exercises are safe for you. Do exercises exactly as told by your health care provider and adjust them as directed. It is normal to feel mild stretching, pulling, tightness, or discomfort as you do these exercises, but you should stop right away if you feel sudden pain or your pain gets worse.Do not begin these exercises until told by your health care provider. STRETCHING AND RANGE OF MOTION EXERCISES These exercises warm up your muscles and joints and improve the movement and flexibility of your knee. These exercises also help to relieve pain, numbness, and tingling. Exercise A: Knee Extension, Prone 1. Lie on your abdomen on a bed. 2. Place your left / right knee just beyond the edge of the surface so your knee is not on the bed. You can put a towel under your left / right thigh just above your knee for comfort. 3. Relax your leg muscles and allow gravity to straighten your knee. You should feel a stretch behind your left / right knee. 4. Hold this position for __________ seconds. 5. Scoot up so your knee is supported between repetitions. Repeat __________ times. Complete this stretch __________ times a day. Exercise B: Knee Flexion, Active  1. Lie on your back with both knees straight. If this causes back discomfort, bend your left / right knee so your foot is flat on the floor. 2. Slowly slide your left / right heel back toward your buttocks until you feel a gentle  stretch in the front of your knee or thigh. 3. Hold this position for __________ seconds. 4. Slowly slide your left / right heel back to the starting position. Repeat __________ times. Complete this exercise __________ times a day. Exercise C: Quadriceps, Prone  1. Lie on your abdomen on a firm surface, such as a bed or padded floor. 2. Bend your left / right knee and hold your ankle. If you cannot reach your ankle or pant leg, loop a belt around your foot and grab the belt instead. 3. Gently pull your heel toward your buttocks. Your knee should not slide out to the side. You should feel a stretch in the front of your thigh and knee. 4. Hold this position for __________ seconds. Repeat __________ times. Complete this stretch __________ times a day. Exercise D: Hamstring, Supine 1. Lie on your back. 2. Loop a belt or towel over the ball of your left / right foot. The ball of   your foot is on the walking surface, right under your toes. 3. Straighten your left / right knee and slowly pull on the belt to raise your leg until you feel a gentle stretch behind your knee. ? Do not let your left / right knee bend while you do this. ? Keep your other leg flat on the floor. 4. Hold this position for __________ seconds. Repeat __________ times. Complete this stretch __________ times a day. STRENGTHENING EXERCISES These exercises build strength and endurance in your knee. Endurance is the ability to use your muscles for a long time, even after they get tired. Exercise E: Quadriceps, Isometric  1. Lie on your back with your left / right leg extended and your other knee bent. Put a rolled towel or small pillow under your knee if told by your health care provider. 2. Slowly tense the muscles in the front of your left / right thigh. You should see your kneecap slide up toward your hip or see increased dimpling just above the knee. This motion will push the back of the knee toward the floor. 3. For __________  seconds, keep the muscle as tight as you can without increasing your pain. 4. Relax the muscles slowly and completely. Repeat __________ times. Complete this exercise __________ times a day. Exercise F: Straight Leg Raises - Quadriceps 1. Lie on your back with your left / right leg extended and your other knee bent. 2. Tense the muscles in the front of your left / right thigh. You should see your kneecap slide up or see increased dimpling just above the knee. Your thigh may even shake a bit. 3. Keep these muscles tight as you raise your leg 4-6 inches (10-15 cm) off the floor. Do not let your knee bend. 4. Hold this position for __________ seconds. 5. Keep these muscles tense as you lower your leg. 6. Relax your muscles slowly and completely after each repetition. Repeat __________ times. Complete this exercise __________ times a day. Exercise G: Hamstring, Isometric 1. Lie on your back on a firm surface. 2. Bend your left / right knee approximately __________ degrees. 3. Dig your left / right heel into the surface as if you are trying to pull it toward your buttocks. Tighten the muscles in the back of your thighs to dig as hard as you can without increasing any pain. 4. Hold this position for __________ seconds. 5. Release the tension gradually and allow your muscles to relax completely for __________ seconds after each repetition. Repeat __________ times. Complete this exercise __________ times a day. Exercise H: Hamstring Curls  If told by your health care provider, do this exercise while wearing ankle weights. Begin with __________ weights. Then increase the weight by 1 lb (0.5 kg) increments. Do not wear ankle weights that are more than __________. 1. Lie on your abdomen with your legs straight. 2. Bend your left / right knee as far as you can without feeling pain. Keep your hips flat against the floor. 3. Hold this position for __________ seconds. 4. Slowly lower your leg to the  starting position.  Repeat __________ times. Complete this exercise __________ times a day. Exercise I: Squats (Quadriceps) 1. Stand in front of a table, with your feet and knees pointing straight ahead. You may rest your hands on the table for balance but not for support. 2. Slowly bend your knees and lower your hips like you are going to sit in a chair. ? Keep your weight over your heels,   not over your toes. ? Keep your lower legs upright so they are parallel with the table legs. ? Do not let your hips go lower than your knees. ? Do not bend lower than told by your health care provider. ? If your knee pain increases, do not bend as low. 3. Hold the squat position for __________ seconds. 4. Slowly push with your legs to return to standing. Do not use your hands to pull yourself to standing. Repeat __________ times. Complete this exercise __________ times a day. Exercise J: Wall Slides (Quadriceps)  1. Lean your back against a smooth wall or door while you walk your feet out 18-24 inches (46-61 cm) from it. 2. Place your feet hip-width apart. 3. Slowly slide down the wall or door until your knees bend __________ degrees. Keep your knees over your heels, not over your toes. Keep your knees in line with your hips. 4. Hold for __________ seconds. Repeat __________ times. Complete this exercise __________ times a day. Exercise K: Straight Leg Raises - Hip Abductors 1. Lie on your side with your left / right leg in the top position. Lie so your head, shoulder, knee, and hip line up. You may bend your bottom knee to help you keep your balance. 2. Roll your hips slightly forward so your hips are stacked directly over each other and your left / right knee is facing forward. 3. Leading with your heel, lift your top leg 4-6 inches (10-15 cm). You should feel the muscles in your outer hip lifting. ? Do not let your foot drift forward. ? Do not let your knee roll toward the ceiling. 4. Hold this  position for __________ seconds. 5. Slowly return your leg to the starting position. 6. Let your muscles relax completely after each repetition. Repeat __________ times. Complete this exercise __________ times a day. Exercise L: Straight Leg Raises - Hip Extensors 1. Lie on your abdomen on a firm surface. You can put a pillow under your hips if that is more comfortable. 2. Tense the muscles in your buttocks and lift your left / right leg about 4-6 inches (10-15 cm). Keep your knee straight as you lift your leg. 3. Hold this position for __________ seconds. 4. Slowly lower your leg to the starting position. 5. Let your leg relax completely after each repetition. Repeat __________ times. Complete this exercise __________ times a day. This information is not intended to replace advice given to you by your health care provider. Make sure you discuss any questions you have with your health care provider. Document Released: 04/13/2005 Document Revised: 02/22/2016 Document Reviewed: 04/05/2015 Elsevier Interactive Patient Education  2018 [employer]  

## 2017-10-19 ENCOUNTER — Encounter: Payer: Self-pay | Admitting: Physical Therapy

## 2017-10-19 ENCOUNTER — Ambulatory Visit: Payer: Medicare Other | Attending: Physician Assistant | Admitting: Physical Therapy

## 2017-10-19 ENCOUNTER — Other Ambulatory Visit: Payer: Self-pay

## 2017-10-19 ENCOUNTER — Other Ambulatory Visit: Payer: Self-pay | Admitting: Internal Medicine

## 2017-10-19 DIAGNOSIS — R2681 Unsteadiness on feet: Secondary | ICD-10-CM | POA: Insufficient documentation

## 2017-10-19 DIAGNOSIS — R531 Weakness: Secondary | ICD-10-CM | POA: Diagnosis not present

## 2017-10-19 DIAGNOSIS — F411 Generalized anxiety disorder: Secondary | ICD-10-CM

## 2017-10-19 MED ORDER — ALPRAZOLAM 1 MG PO TABS: ORAL_TABLET | ORAL | 0 refills | Status: DC

## 2017-10-19 NOTE — Therapy (Addendum)
[place], [place], [address] Phone: [phone]   Fax:  [phone]  Physical Therapy Evaluation / Discharge Summary  Patient Details  Name: Debra Short MRN: [id number] Date of Birth: [birth year] Referring Provider: Ofilia Neas, PA-C   Encounter Date: 10/19/2017  PT End of Session - 10/19/17 1448    Visit Number  1    Number of Visits  13    Date for PT Re-Evaluation  11/30/17    Authorization Type  MCR: Kx mod by 15th visit, progress note by 10 visit     PT Start Time  1450    PT Stop Time  1548    PT Time Calculation (min)  58 min    Activity Tolerance  Patient tolerated treatment well    Behavior During Therapy  [facility] for tasks assessed/performed       Past Medical History:  Diagnosis Date  . Allergy   . Depression with anxiety 10/06/2014  . Diabetes mellitus without complication ([place])   . Fibromyalgia   . GERD (gastroesophageal reflux disease)   . Hyperlipidemia   . Hypertension   . IBS (irritable bowel syndrome)   . 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 Vitamin D deficiency     Past Surgical History:  Procedure Laterality Date  . ABDOMINAL HYSTERECTOMY    . ORIF ANKLE FRACTURE Left 09/26/2014  . ORIF ANKLE FRACTURE Left 09/26/2014   Procedure: OPEN REDUCTION INTERNAL FIXATION (ORIF) ANKLE FRACTURE;  Surgeon: [name], MD;  Location: [facility];  Service: Orthopedics;  Laterality: Left;  . TONSILLECTOMY      There were no vitals filed for this visit.   Subjective Assessment - 10/19/17 1459    Subjective  pt is a 77 y.o F with CC of bil ankle pain and instability with L>R. Reports hx of L ankle fx back in 2016 due to a fall and had to ORIF. pt reports the pain and instability before the surgery. reports she feels her gait is off and has trouble pointing toes, and feels more weakness pt reports pain seems to fluctuates depending on activity.     Limitations  Sitting;Standing;Walking    How long  can you sit comfortably?  60 min    How long can you stand comfortably?  30-45 min    How long can you walk comfortably?  30-45 min     Diagnostic tests  x-ray    Patient Stated Goals  to get back to being able to get up and moving around. return to normal    Currently in Pain?  Yes    Pain Score  6  last took medication for 12:30    Pain Location  Ankle    Pain Orientation  Right;Left L>R    Pain Descriptors / Indicators  Aching;Tightness    Pain Type  Chronic pain    Pain Onset  More than a month ago    Pain Frequency  Constant    Aggravating Factors   getting up from a seated position, standing for long periods of time.     Pain Relieving Factors  medication,          [facility] PT Assessment - 10/19/17 1435      Assessment   Medical Diagnosis  bil ankle instability and general deconditioning    Referring Provider  Ofilia Neas, PA-C    Onset Date/Surgical Date  -- over 5 years    Hand Dominance  Right    Next MD Visit  6 months    Prior Therapy  yes      Precautions   Precautions  None      Restrictions   Weight Bearing Restrictions  No      Balance Screen   Has the patient fallen in the past 6 months  Yes    How many times?  8    Has the patient had a decrease in activity level because of a fear of falling?   Yes    Is the patient reluctant to leave their home because of a fear of falling?   Yes      White residence    Type of [place] Access  Stairs to enter    Entrance Stairs-Number of Steps  2    Entrance Stairs-Rails  None    Home Layout  One level      Prior Function   Level of Independence  Independent with basic ADLs    Vocation  Retired      Associate Professor   Overall Cognitive Status  Within Functional Limits for tasks assessed      Observation/Other Assessments   Focus on Therapeutic Outcomes (FOTO)   70%  predicted 58% limited      Posture/Postural Control   Posture/Postural Control  Postural  limitations    Postural Limitations  Rounded Shoulders;Forward head      ROM / Strength   AROM / PROM / Strength  AROM;Strength;PROM      AROM   AROM Assessment Site  Ankle    Right/Left Ankle  Right;Left    Right Ankle Dorsiflexion  6    Right Ankle Plantar Flexion  60    Right Ankle Inversion  12    Right Ankle Eversion  24    Left Ankle Dorsiflexion  8    Left Ankle Plantar Flexion  50    Left Ankle Inversion  20 pain during movement    Left Ankle Eversion  5      PROM   PROM Assessment Site  Ankle    Right/Left Ankle  Right;Left    Right Ankle Dorsiflexion  12    Left Ankle Dorsiflexion  12    Left Ankle Eversion  15      Strength   Strength Assessment Site  Ankle    Right/Left Ankle  Right;Left    Right Ankle Dorsiflexion  5/5    Right Ankle Plantar Flexion  5/5    Right Ankle Inversion  4/5    Right Ankle Eversion  4/5    Left Ankle Dorsiflexion  5/5    Left Ankle Plantar Flexion  5/5    Left Ankle Inversion  4/5    Left Ankle Eversion  4/5      Palpation   Palpation comment  R ankle soreness located along the CFL and ATFL  and along anterior tib and calf musculature.  L ankle around bil malleoulus and ATFL / deltoid ligament regions.       Special Tests    Special Tests  Ankle/Foot Special Tests    Ankle/Foot Special Tests   Talar Tilt Test;Anterior Drawer Test      Anterior Drawer Test   Findings  Positive    Side   Left      Talar Tilt Test    Findings  Postive    Comments  L>R  Objective measurements completed on examination: See above findings.      [facility] Adult PT Treatment/Exercise - 10/19/17 1435      Ankle Exercises: Seated   Towel Crunch  4 reps verbal cues for proper form.    Other Seated Ankle Exercises  4-way ankle strengthening with red theraband 2 x 10             PT Education - 10/19/17 1505    Education provided  Yes    Education Details  evaluation findings, POC, goals, HEP with proper form /  rationale, anatomy of area involved.    Person(s) Educated  Patient    Methods  Explanation;Verbal cues;Handout;Demonstration    Comprehension  Verbalized understanding;Verbal cues required;Returned demonstration       PT Short Term Goals - 10/19/17 1559      PT SHORT TERM GOAL #1   Title  pt to be I with inital HEP     Time  3    Period  Weeks    Status  New    Target Date  11/09/17      PT SHORT TERM GOAL #2   Title  pt to verbalize/ demo proper gait pattern for effiency and reduce reoccurence of ankle pain     Time  3    Period  Weeks    Status  New    Target Date  11/09/17      PT SHORT TERM GOAL #3   Title  assess BERG balance and make goals associated with assessment    Time  1    Period  Weeks    Status  New    Target Date  10/26/17        PT Long Term Goals - 10/19/17 1601      PT LONG TERM GOAL #1   Title  pt to increase bil ankle strength to >/= 4+/5 for ankle stability with walking/ standing activities.     Time  6    Period  Weeks    Status  New    Target Date  11/30/17      PT LONG TERM GOAL #2   Title  pt to be able to stand/ walk >/= 30 min for functional endurance required for ADLs  reporting </= 2/10 pain    Time  6    Period  Weeks    Status  New    Target Date  11/30/17      PT LONG TERM GOAL #3   Title  pt to increase FOTO score to </=58% limited to demo improvement in function     Time  6    Period  Weeks    Status  New    Target Date  11/30/17      PT LONG TERM GOAL #4   Title  pt to be I with all HEP given as of last visit to maintain and progress current level of function     Time  6    Period  Weeks    Status  New    Target Date  11/30/17             Plan - 10/19/17 1553    Clinical Impression Statement  pt presents to OPPT with CC of bil ankle pain with L>R that has been going on for 5 years. She demonstrates functional ankle mobility with tightness/ pain noted with l ankle inversion/ eversion. mild strength deficits  noted in bil inverters/ everters. She reports  hx of falling and plan to assess balance next visit. she would benefit from physcial therapy to improve ankle stability/ endurance, reduce falling and maximize balance and overall function by addressing the deficits listed.     History and Personal Factors relevant to plan of care:  hx of depression, and L ankle ORIF, pt lives alone    Clinical Presentation  Evolving    Clinical Presentation due to:  unsteadiness, ankle weakness, abnormal gait    Clinical Decision Making  Moderate    Rehab Potential  Good    PT Frequency  2x / week    PT Duration  6 weeks    PT Treatment/Interventions  ADLs/Self Care Home Management;Cryotherapy;Electrical Stimulation;Moist Heat;Therapeutic activities;Therapeutic exercise;Balance training;Gait training;Stair training;Manual techniques;Taping;Dry needling;Passive range of motion    PT Next Visit Plan  Review/ update HEP. BERG balance assessment, ankle strengthening, assess hip strength,     PT Home Exercise Plan  towel scrunch, 4-way ankle strengthening,     Consulted and Agree with Plan of Care  Patient       Patient will benefit from skilled therapeutic intervention in order to improve the following deficits and impairments:  Abnormal gait, Pain, Impaired UE functional use, Decreased strength, Postural dysfunction, Improper body mechanics, Decreased activity tolerance, Decreased endurance, Decreased safety awareness  Visit Diagnosis: General weakness  Unsteadiness on feet     Problem List Patient Active Problem List   Diagnosis Date Noted  . Depression, major, recurrent, in partial remission ([place]) 04/25/2017  . Estrogen deficiency 04/25/2017  . Hypoxia   . CAP (community acquired pneumonia) 03/25/2017  . History of migraine 08/25/2016  . Degenerative disc disease, lumbar 07/20/2016  . Primary osteoarthritis of both knees 07/20/2016  . Fibromyalgia syndrome 07/20/2016  . 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 Insomnia 10/06/2014  . Medication management 10/09/2013  . Hyperlipidemia   . Hypertension   . GERD (gastroesophageal reflux disease)   . Vitamin D deficiency   . IBS (irritable bowel syndrome)   . Diabetes mellitus type 2, controlled ([place])   . Non Hodgkin's lymphoma ([place])    Kristoffer Leamon PT, DPT, LAT, ATC  10/19/17  4:07 PM      [facility] [address] [place], [place], [address] Phone: [phone]   Fax:  [phone]  Name: Debra Short MRN: [id number] Date of Birth: [birth year]      PHYSICAL THERAPY DISCHARGE SUMMARY  Visits from Start of Care: 1  Current functional level related to goals / functional outcomes: See goals   Remaining deficits: unknown   Education / Equipment: HEP,   Plan: Patient agrees to discharge.  Patient goals were not met. Patient is being discharged due to not returning since the last visit.  ?????         Kristoffer Leamon PT, DPT, LAT, ATC  11/28/17  1:32 PM

## 2017-10-26 ENCOUNTER — Encounter: Payer: Self-pay | Admitting: Physical Therapy

## 2017-10-26 ENCOUNTER — Ambulatory Visit: Payer: Medicare Other | Admitting: Physical Therapy

## 2017-10-26 ENCOUNTER — Telehealth: Payer: Self-pay | Admitting: Physical Therapy

## 2017-10-26 NOTE — Telephone Encounter (Signed)
Spoke to patient regarding missed appointment this morning. She reports having appliances replaced today and was unable to attend. She plans to attend her next appointment.

## 2017-11-01 ENCOUNTER — Ambulatory Visit: Payer: Self-pay | Admitting: Adult Health

## 2017-11-08 ENCOUNTER — Ambulatory Visit
Admission: RE | Admit: 2017-11-08 | Discharge: 2017-11-08 | Disposition: A | Discharge status: Home / Self Care | Payer: Medicare Other | Source: Ambulatory Visit | Attending: Physician Assistant | Admitting: Physician Assistant

## 2017-11-08 ENCOUNTER — Ambulatory Visit: Payer: Medicare Other | Admitting: Physical Therapy

## 2017-11-08 DIAGNOSIS — Z13820 Encounter for screening for osteoporosis: Secondary | ICD-10-CM | POA: Diagnosis not present

## 2017-11-08 DIAGNOSIS — Z780 Asymptomatic menopausal state: Secondary | ICD-10-CM | POA: Diagnosis not present

## 2017-11-08 DIAGNOSIS — Z1231 Encounter for screening mammogram for malignant neoplasm of breast: Secondary | ICD-10-CM | POA: Diagnosis not present

## 2017-11-08 DIAGNOSIS — E2839 Other primary ovarian failure: Secondary | ICD-10-CM

## 2017-11-08 LAB — HM DEXA SCAN

## 2017-11-09 ENCOUNTER — Ambulatory Visit: Payer: Medicare Other | Admitting: Physical Therapy

## 2017-11-14 ENCOUNTER — Telehealth: Payer: Self-pay | Admitting: Physical Therapy

## 2017-11-14 ENCOUNTER — Ambulatory Visit: Payer: Medicare Other | Attending: Physician Assistant | Admitting: Physical Therapy

## 2017-11-14 NOTE — Telephone Encounter (Signed)
Attempted to call patient X2 today about a no show visit. (her 2nd)  Unable to leave a message due to voicemail not yet set up. Melvenia Needles  PTA

## 2017-11-16 ENCOUNTER — Ambulatory Visit: Payer: Medicare Other | Admitting: Physical Therapy

## 2017-11-21 ENCOUNTER — Ambulatory Visit: Payer: Medicare Other | Admitting: Physical Therapy

## 2017-11-21 ENCOUNTER — Telehealth: Payer: Self-pay | Admitting: Physical Therapy

## 2017-11-21 NOTE — Telephone Encounter (Signed)
Left message about 3rd missed visit.( 10/26/2017, 11/14/2017, and 11/21/2017)   Informed of our policy to discharge after 3 missed visits however since I was unable to leave a message on the 2nd missed visit I asked her to contact the office and confirm the next visit if she was interested.  The date and time of  the next scheduled appointment was given.  Our number was given.  She was informed she will be discharged if she does not contact us.  She was also informed that if she needs PT in the future, all she will have to do is get a new prescription and schedule. Melvenia Needles PTA

## 2017-11-23 ENCOUNTER — Ambulatory Visit: Payer: Medicare Other | Admitting: Physical Therapy

## 2017-11-24 ENCOUNTER — Encounter: Payer: Self-pay | Admitting: *Deleted

## 2017-11-28 ENCOUNTER — Encounter: Payer: Self-pay | Admitting: Physical Therapy

## 2017-11-30 ENCOUNTER — Encounter: Payer: Self-pay | Admitting: Physical Therapy

## 2017-12-04 ENCOUNTER — Other Ambulatory Visit: Payer: Self-pay | Admitting: Internal Medicine

## 2017-12-04 DIAGNOSIS — G894 Chronic pain syndrome: Secondary | ICD-10-CM

## 2017-12-19 ENCOUNTER — Other Ambulatory Visit: Payer: Self-pay | Admitting: Internal Medicine

## 2017-12-19 DIAGNOSIS — F411 Generalized anxiety disorder: Secondary | ICD-10-CM

## 2018-01-24 ENCOUNTER — Other Ambulatory Visit: Payer: Self-pay | Admitting: Internal Medicine

## 2018-02-05 ENCOUNTER — Encounter: Payer: Self-pay | Admitting: Internal Medicine

## 2018-02-05 ENCOUNTER — Ambulatory Visit (INDEPENDENT_AMBULATORY_CARE_PROVIDER_SITE_OTHER): Payer: Medicare Other | Admitting: Internal Medicine

## 2018-02-05 VITALS — BP 110/74 | HR 72 | Temp 97.5°F | Resp 16 | Ht 65.0 in | Wt 165.6 lb

## 2018-02-05 DIAGNOSIS — N182 Chronic kidney disease, stage 2 (mild): Secondary | ICD-10-CM | POA: Diagnosis not present

## 2018-02-05 DIAGNOSIS — E782 Mixed hyperlipidemia: Secondary | ICD-10-CM

## 2018-02-05 DIAGNOSIS — E559 Vitamin D deficiency, unspecified: Secondary | ICD-10-CM | POA: Diagnosis not present

## 2018-02-05 DIAGNOSIS — M797 Fibromyalgia: Secondary | ICD-10-CM | POA: Diagnosis not present

## 2018-02-05 DIAGNOSIS — E1122 Type 2 diabetes mellitus with diabetic chronic kidney disease: Secondary | ICD-10-CM

## 2018-02-05 DIAGNOSIS — Z79899 Other long term (current) drug therapy: Secondary | ICD-10-CM

## 2018-02-05 DIAGNOSIS — I10 Essential (primary) hypertension: Secondary | ICD-10-CM | POA: Diagnosis not present

## 2018-02-05 MED ORDER — GABAPENTIN 600 MG PO TABS: ORAL_TABLET | ORAL | 3 refills | Status: DC

## 2018-02-05 NOTE — Patient Instructions (Signed)

## 2018-02-05 NOTE — Progress Notes (Signed)
This very nice 77 y.o. WWF presents for 6 month follow up with HTN, HLD, T2_NIDDM/CKD2 and Vitamin D Deficiency. Patient has GERD controlled on her meds.      Patient has a long standing hx/o a fibromyalgia syndrome with shoulder/pelvic limb girdle myalgias & stiffness followed in the [place] by Dr Estanislado Pandy and now no longer plans f/u there since her office has stopped rx'ing her Hydrocodone.      Patient is treated for HTN & BP has been controlled at home. Today's BP is at goal - 110/74. Cardiolite testing was Normal in 2008. Patient has had no complaints of any cardiac type chest pain, palpitations, dyspnea / orthopnea / PND, dizziness, claudication, or dependent edema.     Hyperlipidemia is controlled with diet & meds. Patient denies myalgias or other med SE's. Last Lipids were at goal: Lab Results  Component Value Date   CHOL 146 07/27/2017   HDL 51 07/27/2017   LDLCALC 74 07/27/2017   TRIG 126 07/27/2017   CHOLHDL 2.9 07/27/2017      Also, the patient has history of PreDM (2009) and then T2_NIDDM (2016) started on Metformin and has had no symptoms of reactive hypoglycemia, diabetic polys, paresthesias or visual blurring.  She is mildly over weight (BMI 27+) and last A1c was not at goal: Lab Results  Component Value Date   HGBA1C 6.1 (H) 07/27/2017      Further, the patient also has history of Vitamin D Deficiency ("25"/2009) and supplements vitamin D without any suspected side-effects. Last vitamin D was at goal:  Lab Results  Component Value Date   VD25OH 76 07/27/2017   Current Outpatient Medications on File Prior to Visit  Medication Sig  . ALPRAZolam (XANAX) 1 MG tablet TAKE 1/2-1 TABLET 2-3 TIMES DAILY ONLY IF NEEDED FOR ANXIETY ATTACK-LIMIT 5DAYS/WEEK AVOID ADDICTION  . aspirin EC 81 MG tablet Take 81 mg by mouth daily.  Marland Kitchen atenolol (TENORMIN) 50 MG tablet Take 1 tablet (50 mg total) by mouth daily.  Marland Kitchen atorvastatin (LIPITOR) 80 MG tablet TAKE 1/3 TABLET BY MOUTH DAILY FOR  CHOLESTEROL. DISCONTINUE PRAVASTATIN  . bisoprolol (ZEBETA) 5 MG tablet TAKE 1 TABLET EVERY MORNING FOR BLOOD PRESSURE  . Esomeprazole Magnesium (NEXIUM PO) Take 22.3 mg by mouth See admin instructions. Take 1 capsule (22.3 mg OTC) by mouth every morning before breakfast, may also take 1 capsule in the evening as needed for acid reflux  . FLUoxetine (PROZAC) 40 MG capsule TAKE 1 CAPSULE BY MOUTH EVERY DAY  . fluticasone (FLONASE) 50 MCG/ACT nasal spray PLACE 2 SPRAYS INTO BOTH NOSTRILS DAILY.  Marland Kitchen gabapentin (NEURONTIN) 300 MG capsule TAKE 2 CAPSULES TWICE A DAY  . metFORMIN (GLUCOPHAGE-XR) 500 MG 24 hr tablet TAKE 1 TABLET EVERY DAY WITH BREAKFAST  . montelukast (SINGULAIR) 10 MG tablet TAKE 1 TABLET EVERY DAY AS NEEDED FOR ALLERGIES  . Multiple Vitamin (MULTIVITAMIN WITH MINERALS) TABS tablet Take 1 tablet by mouth daily.  Marland Kitchen oxybutynin (DITROPAN) 5 MG tablet TAKE 1/2 TO 1 TABLET 2 TIMES DAILY FOR BLADDER  . triamcinolone (NASACORT) 55 MCG/ACT AERO nasal inhaler Place 2 sprays into both nostrils daily as needed (for seasonal allergies).    No current facility-administered medications on file prior to visit.    Allergies  Allergen Reactions  . Effexor [Venlafaxine] Other (See Comments)    Not effective  . Nortriptyline Other (See Comments)    Not effective  . Paxil [Paroxetine Hcl] Other (See Comments)    Not  effective  . Robaxin [Methocarbamol] Itching  . Tizanidine Itching  . Zoloft [Sertraline Hcl] Other (See Comments)    Not effective   PMHx:   Past Medical History:  Diagnosis Date  . Allergy   . Depression with anxiety 10/06/2014  . Diabetes mellitus without complication ([place])   . Fibromyalgia   . GERD (gastroesophageal reflux disease)   . Hyperlipidemia   . Hypertension   . IBS (irritable bowel syndrome)   . 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 Vitamin D deficiency    Immunization History  Administered Date(s) Administered  . Influenza Split 04/10/2013, 03/23/2016  .  Influenza, High Dose Seasonal PF 03/24/2014, 02/27/2015  . Pneumococcal Conjugate-13 03/24/2014  . Pneumococcal Polysaccharide-23 06/13/2008  . Td 09/24/2012  . Tdap 09/26/2014  . Zoster 02/23/2011   Past Surgical History:  Procedure Laterality Date  . ABDOMINAL HYSTERECTOMY    . ORIF ANKLE FRACTURE Left 09/26/2014  . ORIF ANKLE FRACTURE Left 09/26/2014   Procedure: OPEN REDUCTION INTERNAL FIXATION (ORIF) ANKLE FRACTURE;  Surgeon: [name], MD;  Location: [facility];  Service: Orthopedics;  Laterality: Left;  . TONSILLECTOMY     FHx:    Reviewed / unchanged  SHx:    Reviewed / unchanged   Systems Review:  Constitutional: Denies fever, chills, wt changes, headaches, insomnia, fatigue, night sweats, change in appetite. Eyes: Denies redness, blurred vision, diplopia, discharge, itchy, watery eyes.  ENT: Denies discharge, congestion, post nasal drip, epistaxis, sore throat, earache, hearing loss, dental pain, tinnitus, vertigo, sinus pain, snoring.  CV: Denies chest pain, palpitations, irregular heartbeat, syncope, dyspnea, diaphoresis, orthopnea, PND, claudication or edema. Respiratory: denies cough, dyspnea, DOE, pleurisy, hoarseness, laryngitis, wheezing.  Gastrointestinal: Denies dysphagia, odynophagia, heartburn, reflux, water brash, abdominal pain or cramps, nausea, vomiting, bloating, diarrhea, constipation, hematemesis, melena, hematochezia  or hemorrhoids. Genitourinary: Denies dysuria, frequency, urgency, nocturia, hesitancy, discharge, hematuria or flank pain. Musculoskeletal: Denies arthralgias, myalgias, stiffness, jt. swelling, pain, limping or strain/sprain.  Skin: Denies pruritus, rash, hives, warts, acne, eczema or change in skin lesion(s). Neuro: No weakness, tremor, incoordination, spasms, paresthesia or pain. Psychiatric: Denies confusion, memory loss or sensory loss. Endo: Denies change in weight, skin or hair change.  Heme/Lymph: No excessive bleeding, bruising or  enlarged lymph nodes.  Physical Exam  BP 110/74   Pulse 72   Temp (!) 97.5 F (36.4 C)   Resp 16   Ht 5\' 5"  (1.651 m)   Wt 165 lb 9.6 oz (75.1 kg)   BMI 27.56 kg/m   Appears  well nourished, well groomed  and in no distress.  Eyes: PERRLA, EOMs, conjunctiva no swelling or erythema. Sinuses: No frontal/maxillary tenderness ENT/Mouth: EAC's clear, TM's nl w/o erythema, bulging. Nares clear w/o erythema, swelling, exudates. Oropharynx clear without erythema or exudates. Oral hygiene is good. Tongue normal, non obstructing. Hearing intact.  Neck: Supple. Thyroid not palpable. Car 2+/2+ without bruits, nodes or JVD. Chest: Respirations nl with BS clear & equal w/o rales, rhonchi, wheezing or stridor.  Cor: Heart sounds normal w/ regular rate and rhythm without sig. murmurs, gallops, clicks or rubs. Peripheral pulses normal and equal  without edema.  Abdomen: Soft & bowel sounds normal. Non-tender w/o guarding, rebound, hernias, masses or organomegaly.  Lymphatics: Unremarkable.  Musculoskeletal: Full ROM all peripheral extremities, joint stability, 5/5 strength and normal gait.  Skin: Warm, dry without exposed rashes, lesions or ecchymosis apparent.  Neuro: Cranial nerves intact, reflexes equal bilaterally. Sensory-motor testing grossly intact. Tendon reflexes grossly intact.  Pysch: Alert &  oriented x 3.  Insight and judgement nl & appropriate. No ideations.  Assessment and Plan:  1. Essential hypertension  - Continue medication, monitor blood pressure at home.  - Continue DASH diet.  Reminder to go to the ER if any CP,  SOB, nausea, dizziness, severe HA, changes vision/speech.  - CBC with Differential/Platelet - COMPLETE METABOLIC PANEL WITH GFR - Magnesium - TSH  2. Hyperlipidemia, mixed  - Continue diet/meds, exercise,& lifestyle modifications.  - Continue monitor periodic cholesterol/liver & renal functions   - Lipid panel - TSH  3. Controlled type 2 diabetes  mellitus with stage 2 chronic kidney disease, without long-term current use of insulin (HCC)  - Continue diet, exercise, lifestyle modifications.  - Monitor appropriate labs.  - Hemoglobin A1c - Insulin, random  4. Vitamin D deficiency  - Continue supplementation.   - VITAMIN D 25 Hydroxyl  5. Fibromyalgia.  - Rx- Gabapentin 600 mg tab #270 x 3 rf - take 1/2 to 1tab 2-3 x/day   6. Medication management  - CBC with Differential/Platelet - COMPLETE METABOLIC PANEL WITH GFR - Magnesium - Lipid panel - TSH - Hemoglobin A1c - Insulin, random - VITAMIN D 25 Hydroxyl       Patient given sx's of Myrbetriq sx's of 25 mg #7 and 50 mg #14 to try for her OAB sx's.  Also, discussed  regular exercise, BP monitoring, weight control to achieve/maintain BMI less than 25 and discussed med and SE's. Recommended labs to assess and monitor clinical status with further disposition pending results of labs. Over 30 minutes of exam, counseling, chart review was performed.

## 2018-02-06 LAB — COMPLETE METABOLIC PANEL WITH GFR
AG Ratio: 1.6 (calc) (ref 1.0–2.5)
ALT: 20 U/L (ref 6–29)
AST: 21 U/L (ref 10–35)
Albumin: 4.4 g/dL (ref 3.6–5.1)
Alkaline phosphatase (APISO): 70 U/L (ref 33–130)
BUN: 17 mg/dL (ref 7–25)
CO2: 35 mmol/L — ABNORMAL HIGH (ref 20–32)
Calcium: 9.8 mg/dL (ref 8.6–10.4)
Chloride: 100 mmol/L (ref 98–110)
Creat: 0.85 mg/dL (ref 0.60–0.93)
GFR, Est African American: 76 mL/min/{1.73_m2} (ref >=60)
GFR, Est Non African American: 65 mL/min/{1.73_m2} (ref >=60)
Globulin: 2.8 g/dL (calc) (ref 1.9–3.7)
Glucose, Bld: 90 mg/dL (ref 65–99)
Potassium: 4.6 mmol/L (ref 3.5–5.3)
Sodium: 141 mmol/L (ref 135–146)
Total Bilirubin: 1.4 mg/dL — ABNORMAL HIGH (ref 0.2–1.2)
Total Protein: 7.2 g/dL (ref 6.1–8.1)

## 2018-02-06 LAB — LIPID PANEL
Cholesterol: 146 mg/dL (ref <200)
HDL: 49 mg/dL — ABNORMAL LOW (ref >50)
LDL Cholesterol (Calc): 74 mg/dL (calc)
Non-HDL Cholesterol (Calc): 97 mg/dL (calc) (ref <130)
Total CHOL/HDL Ratio: 3 (calc) (ref <5.0)
Triglycerides: 148 mg/dL (ref <150)

## 2018-02-06 LAB — CBC WITH DIFFERENTIAL/PLATELET
Basophils Absolute: 40 cells/uL (ref 0–200)
Basophils Relative: 0.6 %
Eosinophils Absolute: 198 cells/uL (ref 15–500)
Eosinophils Relative: 3 %
HCT: 39.6 % (ref 35.0–45.0)
Hemoglobin: 13.3 g/dL (ref 11.7–15.5)
Lymphs Abs: 1736 cells/uL (ref 850–3900)
MCH: 30.6 pg (ref 27.0–33.0)
MCHC: 33.6 g/dL (ref 32.0–36.0)
MCV: 91.2 fL (ref 80.0–100.0)
MPV: 9.7 fL (ref 7.5–12.5)
Monocytes Relative: 9.1 %
Neutro Abs: 4026 cells/uL (ref 1500–7800)
Neutrophils Relative %: 61 %
Platelets: 272 10*3/uL (ref 140–400)
RBC: 4.34 10*6/uL (ref 3.80–5.10)
RDW: 13.3 % (ref 11.0–15.0)
Total Lymphocyte: 26.3 %
WBC mixed population: 601 cells/uL (ref 200–950)
WBC: 6.6 10*3/uL (ref 3.8–10.8)

## 2018-02-06 LAB — TSH: TSH: 2.4 mIU/L (ref 0.40–4.50)

## 2018-02-06 LAB — HEMOGLOBIN A1C
Hgb A1c MFr Bld: 5.9 % of total Hgb — ABNORMAL HIGH (ref <5.7)
Mean Plasma Glucose: 123 (calc)
eAG (mmol/L): 6.8 (calc)

## 2018-02-06 LAB — VITAMIN D 25 HYDROXY (VIT D DEFICIENCY, FRACTURES): Vit D, 25-Hydroxy: 69 ng/mL (ref 30–100)

## 2018-02-06 LAB — INSULIN, RANDOM: Insulin: 13.5 u[IU]/mL (ref 2.0–19.6)

## 2018-02-06 LAB — MAGNESIUM: Magnesium: 2.4 mg/dL (ref 1.5–2.5)

## 2018-02-15 ENCOUNTER — Other Ambulatory Visit: Payer: Self-pay | Admitting: Internal Medicine

## 2018-02-15 ENCOUNTER — Other Ambulatory Visit: Payer: Self-pay | Admitting: Adult Health

## 2018-02-15 DIAGNOSIS — F411 Generalized anxiety disorder: Secondary | ICD-10-CM

## 2018-03-02 ENCOUNTER — Emergency Department (HOSPITAL_COMMUNITY): Payer: Medicare Other

## 2018-03-02 ENCOUNTER — Encounter (HOSPITAL_COMMUNITY): Payer: Self-pay

## 2018-03-02 ENCOUNTER — Other Ambulatory Visit: Payer: Self-pay

## 2018-03-02 ENCOUNTER — Emergency Department (HOSPITAL_COMMUNITY)
Admission: EM | Admit: 2018-03-02 | Discharge: 2018-03-02 | Disposition: A | Discharge status: Home / Self Care | Payer: Medicare Other | Attending: Emergency Medicine | Admitting: Emergency Medicine

## 2018-03-02 DIAGNOSIS — I129 Hypertensive chronic kidney disease with stage 1 through stage 4 chronic kidney disease, or unspecified chronic kidney disease: Secondary | ICD-10-CM | POA: Insufficient documentation

## 2018-03-02 DIAGNOSIS — S199XXA Unspecified injury of neck, initial encounter: Secondary | ICD-10-CM | POA: Diagnosis not present

## 2018-03-02 DIAGNOSIS — Y9389 Activity, other specified: Secondary | ICD-10-CM | POA: Diagnosis not present

## 2018-03-02 DIAGNOSIS — Y92014 Private driveway to single-family (private) house as the place of occurrence of the external cause: Secondary | ICD-10-CM | POA: Diagnosis not present

## 2018-03-02 DIAGNOSIS — S42025A Nondisplaced fracture of shaft of left clavicle, initial encounter for closed fracture: Secondary | ICD-10-CM | POA: Insufficient documentation

## 2018-03-02 DIAGNOSIS — R51 Headache: Secondary | ICD-10-CM | POA: Diagnosis not present

## 2018-03-02 DIAGNOSIS — E1122 Type 2 diabetes mellitus with diabetic chronic kidney disease: Secondary | ICD-10-CM | POA: Diagnosis not present

## 2018-03-02 DIAGNOSIS — Z7982 Long term (current) use of aspirin: Secondary | ICD-10-CM | POA: Diagnosis not present

## 2018-03-02 DIAGNOSIS — W19XXXA Unspecified fall, initial encounter: Secondary | ICD-10-CM | POA: Diagnosis not present

## 2018-03-02 DIAGNOSIS — R58 Hemorrhage, not elsewhere classified: Secondary | ICD-10-CM | POA: Diagnosis not present

## 2018-03-02 DIAGNOSIS — R0902 Hypoxemia: Secondary | ICD-10-CM | POA: Diagnosis not present

## 2018-03-02 DIAGNOSIS — R52 Pain, unspecified: Secondary | ICD-10-CM | POA: Diagnosis not present

## 2018-03-02 DIAGNOSIS — W0110XA Fall on same level from slipping, tripping and stumbling with subsequent striking against unspecified object, initial encounter: Secondary | ICD-10-CM | POA: Diagnosis not present

## 2018-03-02 DIAGNOSIS — Y999 Unspecified external cause status: Secondary | ICD-10-CM | POA: Insufficient documentation

## 2018-03-02 DIAGNOSIS — N182 Chronic kidney disease, stage 2 (mild): Secondary | ICD-10-CM | POA: Diagnosis not present

## 2018-03-02 DIAGNOSIS — S42022A Displaced fracture of shaft of left clavicle, initial encounter for closed fracture: Secondary | ICD-10-CM | POA: Diagnosis not present

## 2018-03-02 DIAGNOSIS — S42002A Fracture of unspecified part of left clavicle, initial encounter for closed fracture: Secondary | ICD-10-CM

## 2018-03-02 DIAGNOSIS — Z7984 Long term (current) use of oral hypoglycemic drugs: Secondary | ICD-10-CM | POA: Insufficient documentation

## 2018-03-02 DIAGNOSIS — S0101XA Laceration without foreign body of scalp, initial encounter: Secondary | ICD-10-CM | POA: Diagnosis not present

## 2018-03-02 DIAGNOSIS — S0990XA Unspecified injury of head, initial encounter: Secondary | ICD-10-CM | POA: Diagnosis not present

## 2018-03-02 MED ORDER — HYDROCODONE-ACETAMINOPHEN 5-325 MG PO TABS
1.0000 | ORAL_TABLET | Freq: Once | ORAL | Status: AC
  Administered 2018-03-02: 1 via ORAL
  Filled 2018-03-02: qty 1

## 2018-03-02 MED ORDER — FENTANYL CITRATE (PF) 100 MCG/2ML IJ SOLN: 50.0000 ug | Freq: Once | INTRAMUSCULAR | Status: DC

## 2018-03-02 MED ORDER — SODIUM CHLORIDE 0.9 % IV BOLUS
1000.0000 mL | Freq: Once | INTRAVENOUS | Status: AC
  Administered 2018-03-02: 1000 mL via INTRAVENOUS

## 2018-03-02 MED ORDER — HYDROCODONE-ACETAMINOPHEN 5-325 MG PO TABS: 1.0000 | ORAL_TABLET | Freq: Four times a day (QID) | ORAL | 0 refills | Status: DC | PRN

## 2018-03-02 MED ORDER — TETANUS-DIPHTH-ACELL PERTUSSIS 5-2.5-18.5 LF-MCG/0.5 IM SUSP
0.5000 mL | Freq: Once | INTRAMUSCULAR | Status: DC
  Filled 2018-03-02: qty 0.5

## 2018-03-02 MED ORDER — FENTANYL CITRATE (PF) 100 MCG/2ML IJ SOLN
50.0000 ug | Freq: Once | INTRAMUSCULAR | Status: DC
  Filled 2018-03-02: qty 2

## 2018-03-02 MED ORDER — ONDANSETRON HCL 4 MG/2ML IJ SOLN
4.0000 mg | Freq: Once | INTRAMUSCULAR | Status: AC
  Administered 2018-03-02: 4 mg via INTRAVENOUS
  Filled 2018-03-02: qty 2

## 2018-03-02 NOTE — ED Notes (Signed)
Patient transported to CT 

## 2018-03-02 NOTE — ED Notes (Signed)
Pt ambulated in hall with no acute distress

## 2018-03-02 NOTE — ED Provider Notes (Signed)
[facility] EMERGENCY DEPARTMENT Provider Note   CSN: [id number] Arrival date & time: 03/02/18  1837     History   Chief Complaint Chief Complaint  Patient presents with  . Fall    HPI Debra Short is a 77 y.o. female history of diabetes, fibromyalgia, GERD, hyperlipidemia who presents for evaluation of left shoulder pain after mechanical fall that occurred earlier this afternoon.  She reports she was walking to the house after shopping and states that she tripped and fell, causing her to fall forward and hit her house.  She states that she turned and so she hit the left shoulder, left side of her head.  Patient states she had no preceding chest pain.  She states that she tripped over her foot, causing her to fall forward.  Patient states that she did not have any LOC.  She reports that she had difficulty getting up after the fall secondary to her shoulder pain.  She reports that since this fall, she has had difficulty moving the shoulder.  Patient denies any numbness.  Patient states that she is unsure when her last tetanus shot was.  Patient states she currently takes aspirin.  She reports pain to the posterior aspect of her head and into the left side of her neck into the midline.  She denies any pain in her hips, bilateral lower extremities.  Patient denies any vision changes, nausea/vomiting, numbness/weakness of her arms or legs, chest pain, difficulty breathing, abdominal pain.  The history is provided by the patient.    Past Medical History:  Diagnosis Date  . Allergy   . Depression with anxiety 10/06/2014  . Diabetes mellitus without complication ([place])   . Fibromyalgia   . GERD (gastroesophageal reflux disease)   . Hyperlipidemia   . Hypertension   . IBS (irritable bowel syndrome)   . 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 Vitamin D deficiency     Patient Active Problem List   Diagnosis Date Noted  . Depression, major, recurrent, in partial remission ([place])  04/25/2017  . Estrogen deficiency 04/25/2017  . Hypoxia   . CAP (community acquired pneumonia) 03/25/2017  . History of migraine 08/25/2016  . Degenerative disc disease, lumbar 07/20/2016  . Primary osteoarthritis of both knees 07/20/2016  . Fibromyalgia syndrome 07/20/2016  . 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 Insomnia 10/06/2014  . Medication management 10/09/2013  . Hyperlipidemia   . Hypertension   . GERD (gastroesophageal reflux disease)   . Vitamin D deficiency   . IBS (irritable bowel syndrome)   . Diabetes mellitus type 2, controlled ([place])   . Non Hodgkin's lymphoma [facility])     Past Surgical History:  Procedure Laterality Date  . ABDOMINAL HYSTERECTOMY    . ORIF ANKLE FRACTURE Left 09/26/2014  . ORIF ANKLE FRACTURE Left 09/26/2014   Procedure: OPEN REDUCTION INTERNAL FIXATION (ORIF) ANKLE FRACTURE;  Surgeon: [name], MD;  Location: [facility];  Service: Orthopedics;  Laterality: Left;  . TONSILLECTOMY       OB History   None      Home Medications    Prior to Admission medications   Medication Sig Start Date End Date Taking? Authorizing Provider  ALPRAZolam Duanne Moron) 1 MG tablet TAKE 1/2-1 TABLET 2-3 TIMES DAILY ONLY IF NEEDED FOR ANXIETY ATTACK-LIMIT 5DAYS/WEEK AVOID ADDICTION 02/16/18   [name], PA-C  aspirin EC 81 MG tablet Take 81 mg by mouth daily.    [provider]  atenolol (TENORMIN) 50 MG tablet Take 1 tablet (50 mg total) by mouth daily. 05/29/17   Unk [name], MD  atorvastatin (LIPITOR) 80 MG tablet TAKE 1/3 TABLET BY MOUTH DAILY FOR CHOLESTEROL. DISCONTINUE PRAVASTATIN 12/04/17   Unk [name], MD  bisoprolol (ZEBETA) 5 MG tablet TAKE 1 TABLET EVERY MORNING FOR BLOOD PRESSURE 10/08/17   Unk [name], MD  Esomeprazole Magnesium (NEXIUM PO) Take 22.3 mg by mouth See admin instructions. Take 1 capsule (22.3 mg OTC) by mouth every morning before breakfast, may also take 1 capsule in the evening as needed for acid reflux     [provider]  FLUoxetine (PROZAC) 40 MG capsule TAKE 1 CAPSULE BY MOUTH EVERY DAY 05/02/17   Unk [name], MD  fluticasone [facility]) 50 MCG/ACT nasal spray PLACE 2 SPRAYS INTO BOTH NOSTRILS DAILY. 09/19/16   Unk [name], MD  gabapentin (NEURONTIN) 600 MG tablet Take 1/2 to 1 tablet 2 to 3 x /day as needed for Fibromyalgia 02/05/18   Unk [name], MD  HYDROcodone-acetaminophen (NORCO/VICODIN) 5-325 MG tablet Take 1-2 tablets by mouth every 6 (six) hours as needed. 03/02/18   Volanda Napoleon, PA-C  metFORMIN (GLUCOPHAGE-XR) 500 MG 24 hr tablet Take 1 to 4 tablets daily as directed for Diabetes 02/15/18   Unk [name], MD  montelukast (SINGULAIR) 10 MG tablet TAKE 1 TABLET EVERY DAY AS NEEDED FOR ALLERGIES 03/23/17   Unk [name], MD  Multiple Vitamin (MULTIVITAMIN WITH MINERALS) TABS tablet Take 1 tablet by mouth daily.    [provider]  oxybutynin (DITROPAN) 5 MG tablet TAKE 1/2 TO 1 TABLET 2 TIMES DAILY FOR BLADDER 01/24/18   Unk [name], MD  triamcinolone (NASACORT) 55 MCG/ACT AERO nasal inhaler Place 2 sprays into both nostrils daily as needed (for seasonal allergies).     [provider]    Family History Family History  Problem Relation Age of Onset  . Cancer Mother   . Cancer Father   . Cancer Brother   . Cancer Maternal Grandfather     Social History Social History   Tobacco Use  . Smoking status: Never Smoker  . Smokeless tobacco: Never Used  Substance Use Topics  . Alcohol use: No  . Drug use: No     Allergies   Effexor [venlafaxine]; Nortriptyline; Paxil [paroxetine hcl]; Robaxin [methocarbamol]; Tizanidine; and Zoloft [sertraline hcl]   Review of Systems Review of Systems  Constitutional: Negative for fever.  Eyes: Negative for visual disturbance.  Respiratory: Negative for cough and shortness of breath.   Cardiovascular: Negative for chest pain.  Gastrointestinal: Negative for abdominal pain, nausea and  vomiting.  Genitourinary: Negative for dysuria and hematuria.  Musculoskeletal: Positive for neck pain.       Left shoulder pain  Neurological: Positive for headaches. Negative for weakness and numbness.  All other systems reviewed and are negative.    Physical Exam Updated Vital Signs BP (!) 148/70   Pulse 67   Temp 97.8 F (36.6 C) (Oral)   Resp 17   Ht 5\' 5"  (1.651 m)   Wt 74.4 kg   SpO2 100%   BMI 27.29 kg/m   Physical Exam  Constitutional: She is oriented to person, place, and time. She appears well-developed and well-nourished.  HENT:  Head: Normocephalic and atraumatic.    Mouth/Throat: Oropharynx is clear and moist and mucous membranes are normal.  Tenderness palpation noted to the left posterior aspect of her skull.  There is an overlying hematoma with small 1 cm laceration.   Eyes:  Pupils are equal, round, and reactive to light. Conjunctivae, EOM and lids are normal.  PERRL, EOMs  Neck: Normal range of motion. Spinous process tenderness present.  Flexion/extension intact but with some subjective reports of pain. Patient has some midline tenderness noted at the C4-C5 region.  No deformities or crepitus noted.  Cardiovascular: Normal rate, regular rhythm, normal heart sounds and normal pulses. Exam reveals no gallop and no friction rub.  No murmur heard. Pulses:      Radial pulses are 2+ on the right side, and 2+ on the left side.       Dorsalis pedis pulses are 2+ on the right side, and 2+ on the left side.  Pulmonary/Chest: Effort normal and breath sounds normal. She has no decreased breath sounds.  Abdominal: Soft. Normal appearance. There is no tenderness. There is no rigidity and no guarding.  Musculoskeletal: Normal range of motion.  Tenderness palpation noted to the left clavicle with obvious deformity.  No tenting of skin.  There is overlying ecchymosis.  Diffuse tenderness over the anterior aspect of the left shoulder.  No deformity or crepitus noted.   Limited range of motion secondary pain.  No tenderness palpation of the left elbow, left wrist.  No tenderness palpation to right upper extremity. No tenderness to palpation to bilateral knees and ankles. No deformities or crepitus noted. FROM of BLE without any difficulty.  Full range of motion of bilateral lower extremities without any difficulty.  Pelvic instability.  Neurological: She is alert and oriented to person, place, and time.  Follows commands, Moves all extremities  5/5 strength to RUE and BLE Sensation intact throughout all major nerve distributions  Skin: Skin is warm and dry. Capillary refill takes less than 2 seconds.  Psychiatric: She has a normal mood and affect. Her speech is normal.  Nursing note and vitals reviewed.    ED Treatments / Results  Labs (all labs ordered are listed, but only abnormal results are displayed) Labs Reviewed - No data to display  EKG None  Radiology Ct Head Wo Contrast  Result Date: 03/02/2018 CLINICAL DATA:  Head trauma with headache EXAM: CT HEAD WITHOUT CONTRAST CT CERVICAL SPINE WITHOUT CONTRAST TECHNIQUE: Multidetector CT imaging of the head and cervical spine was performed following the standard protocol without intravenous contrast. Multiplanar CT image reconstructions of the cervical spine were also generated. COMPARISON:  CT brain 03/26/2017, CT brain and cervical spine 10/03/2015 FINDINGS: CT HEAD FINDINGS Brain: No acute territorial infarction, hemorrhage or intracranial mass. Mild atrophy. Mild small vessel ischemic changes of the white matter. Stable ventricle size. Vascular: No hyperdense vessels.  Carotid vascular calcification Skull: Normal. Negative for fracture or focal lesion. Sinuses/Orbits: No acute finding.  Chronic nasal bone deformity Other: None CT CERVICAL SPINE FINDINGS Alignment: 3 mm anterolisthesis C3 on C4. 2 mm anterolisthesis C5 on C6, unchanged. Facet alignment within normal limits. Skull base and vertebrae: No  acute fracture. No primary bone lesion or focal pathologic process. Soft tissues and spinal canal: No prevertebral fluid or swelling. No visible canal hematoma. Disc levels: Moderate-to-marked degenerative change at C5-C6 and C6-C7 with mild to moderate degenerative change at C3-C4, C4-C5. multiple level bilateral facet degenerative changes. Upper chest: Negative. Other: None IMPRESSION: 1. No CT evidence for acute intracranial abnormality. Atrophy and mild small vessel ischemic changes of the white matter 2. Multiple level degenerative change of the cervical spine. No acute osseous abnormality. Electronically Signed   By: Donavan Foil M.D.   On: 03/02/2018 20:48  Ct Cervical Spine Wo Contrast  Result Date: 03/02/2018 CLINICAL DATA:  Head trauma with headache EXAM: CT HEAD WITHOUT CONTRAST CT CERVICAL SPINE WITHOUT CONTRAST TECHNIQUE: Multidetector CT imaging of the head and cervical spine was performed following the standard protocol without intravenous contrast. Multiplanar CT image reconstructions of the cervical spine were also generated. COMPARISON:  CT brain 03/26/2017, CT brain and cervical spine 10/03/2015 FINDINGS: CT HEAD FINDINGS Brain: No acute territorial infarction, hemorrhage or intracranial mass. Mild atrophy. Mild small vessel ischemic changes of the white matter. Stable ventricle size. Vascular: No hyperdense vessels.  Carotid vascular calcification Skull: Normal. Negative for fracture or focal lesion. Sinuses/Orbits: No acute finding.  Chronic nasal bone deformity Other: None CT CERVICAL SPINE FINDINGS Alignment: 3 mm anterolisthesis C3 on C4. 2 mm anterolisthesis C5 on C6, unchanged. Facet alignment within normal limits. Skull base and vertebrae: No acute fracture. No primary bone lesion or focal pathologic process. Soft tissues and spinal canal: No prevertebral fluid or swelling. No visible canal hematoma. Disc levels: Moderate-to-marked degenerative change at C5-C6 and C6-C7 with mild to  moderate degenerative change at C3-C4, C4-C5. multiple level bilateral facet degenerative changes. Upper chest: Negative. Other: None IMPRESSION: 1. No CT evidence for acute intracranial abnormality. Atrophy and mild small vessel ischemic changes of the white matter 2. Multiple level degenerative change of the cervical spine. No acute osseous abnormality. Electronically Signed   By: Donavan Foil M.D.   On: 03/02/2018 20:48   Dg Chest Portable 1 View  Result Date: 03/02/2018 CLINICAL DATA:  [place] at home. EXAM: PORTABLE CHEST 1 VIEW COMPARISON:  Chest radiograph March 26, 2017 FINDINGS: Cardiac silhouette is mildly enlarged unchanged. Calcified aortic are. No pleural effusion or focal consolidation. Acute LEFT mid clavicle fracture. Soft tissue planes are non suspicious. IMPRESSION: 1. Mild cardiomegaly.  No acute pulmonary process. 2. Acute LEFT clavicle fracture. 3.  Aortic Atherosclerosis (ICD10-I70.0). Electronically Signed   By: Elon Alas M.D.   On: 03/02/2018 22:27   Dg Shoulder Left  Result Date: 03/02/2018 CLINICAL DATA:  [place] from standing while at home. Complaining of upper arm and shoulder pain. EXAM: LEFT SHOULDER - 2+ VIEW COMPARISON:  None. FINDINGS: Fracture of the left clavicle just distal to the middle third, non comminuted, but displaced with the proximal fracture component lying 1 full shaft width above the distal fracture component, and overlapping of the fractures by just over 1 cm. No other fractures. The glenohumeral AC joints are normally aligned. Bones are diffusely demineralized. IMPRESSION: Fracture of the left clavicle as described. No other fractures. No dislocation. Electronically Signed   By: Lajean Manes M.D.   On: 03/02/2018 19:48    Procedures .Marland KitchenLaceration Repair Date/Time: 03/03/2018 1:25 AM Performed by: Volanda Napoleon, PA-C Authorized by: Volanda Napoleon, PA-C   Consent:    Consent obtained:  Verbal   Consent given by:  Patient   Risks discussed:   Infection, need for additional repair, pain, poor cosmetic result and poor wound healing   Alternatives discussed:  No treatment and delayed treatment Universal protocol:    Procedure explained and questions answered to patient or proxy's satisfaction: yes     Relevant documents present and verified: yes     Test results available and properly labeled: yes     Imaging studies available: yes     Required blood products, implants, devices, and special equipment available: yes     Site/side marked: yes     Immediately prior to procedure, a time out  was called: yes     Patient identity confirmed:  Verbally with patient Anesthesia (see MAR for exact dosages):    Anesthesia method:  None Laceration details:    Location:  Scalp   Scalp location:  Occipital   Length (cm):  1.5 Repair type:    Repair type:  Simple Exploration:    Wound exploration: wound explored through full range of motion     Wound extent: no foreign bodies/material noted   Treatment:    Area cleansed with:  Shur-Clens   Amount of cleaning:  Extensive   Irrigation solution:  Sterile saline   Visualized foreign bodies/material removed: no   Skin repair:    Repair method:  Staples   Number of staples:  3 Approximation:    Approximation:  Close Post-procedure details:    Patient tolerance of procedure:  Tolerated well, no immediate complications   (including critical care time)  Medications Ordered in ED Medications  Tdap (BOOSTRIX) injection 0.5 mL (0.5 mLs Intramuscular Not Given 03/02/18 2155)  sodium chloride 0.9 % bolus 1,000 mL (0 mLs Intravenous Stopped 03/02/18 2222)  ondansetron (ZOFRAN) injection 4 mg (4 mg Intravenous Given 03/02/18 2110)  HYDROcodone-acetaminophen (NORCO/VICODIN) 5-325 MG per tablet 1 tablet (1 tablet Oral Given 03/02/18 2116)  HYDROcodone-acetaminophen (NORCO/VICODIN) 5-325 MG per tablet 1 tablet (1 tablet Oral Given 03/02/18 2319)     Initial Impression / Assessment and Plan / ED  Course  I have reviewed the triage vital signs and the nursing notes.  Pertinent labs & imaging results that were available during my care of the patient were reviewed by me and considered in my medical decision making (see chart for details).     77 year old female presents for evaluation after mechanical fall that occurred earlier today.  No preceding chest pain, dizziness.  Tenderness palpation noted to the left shoulder with deformity. Patient is afebrile, non-toxic appearing, sitting comfortably on examination table. Vital signs reviewed and stable. Patient is neurovascularly intact.  Concern for shoulder versus clavicle fracture.  Additionally, patient on aspirin.  She does have a hematoma with a small overlying laceration to posterior aspect of head.  Additionally is complaining of some neck pain.  Will plan for CT head, CT C-spine.  X-ray shoulder reviewed.  There is a clavicle fracture.  Just distal to the middle third.  Non-comminuted.  It is displaced with the proximal fracture component lying 1.  With above the distal fracture component.  No other fractures noted.  CT head negative for any acute abnormality.  CT C-spine negative for any acute abnormality.   Scalp laceration repaired as documented above.  Patient tolerated procedure well.  Patient had an episode where her O2 sat dropped to 85%.  This was after receiving narcotic pain medication.  We will plan for chest x-ray for evaluation of any rib fractures.  Chest x-ray reviewed.  No evidence of rib fractures.  Discussed with radiology who read the x-ray and confirmed the presence of rib fractures.  Repeat vitals are stable.  Patient ablated to the bathroom without any difficulty.  No pain in bilateral lower extremity's.  No difficulty bearing weight.  Patient placed in sling for clavicle fracture.  She does already follow up with Dr. [name] with [facility].  Instructed at home supportive care measures.  Instructed patient  that she will need to follow-up with referred orthopedic doctor next week for further evaluation to see if this needs any further acute management. Patient had ample opportunity for questions and  discussion. All patient's questions were answered with full understanding. Strict return precautions discussed. Patient expresses understanding and agreement to plan.    Final Clinical Impressions(s) / ED Diagnoses   Final diagnoses:  Closed nondisplaced fracture of left clavicle, unspecified part of clavicle, initial encounter  Laceration of scalp, initial encounter    ED Discharge Orders         Ordered    HYDROcodone-acetaminophen (NORCO/VICODIN) 5-325 MG tablet  Every 6 hours PRN     03/02/18 2311           Desma Mcgregor 03/03/18 2316    Quintella Reichert, MD 03/08/18 0830

## 2018-03-02 NOTE — Discharge Instructions (Addendum)
You can take 1000 mg of Tylenol.  Do not exceed 4000 mg of Tylenol a day.  Take pain medication for severe breakthrough pain.  You can apply ice to help with the swelling.  Additionally, keep the sling in place to help with immobilization.  Please follow-up with either Dr. [name] or the referred orthopedic doctor for further evaluation.  Return the emergency department for any worsening pain, numbness/weakness of the arm or leg, discoloration of the arm or any other worsening or concerning symptoms.  Please keep the wound on your head clean.  He will need the staples removed in about 7 days.  He can follow-up with your primary care doctor.

## 2018-03-02 NOTE — ED Notes (Signed)
Patient verbalizes understanding of discharge instructions. Opportunity for questioning and answers were provided. Armband removed by staff, pt discharged from ED in wheelchair.  

## 2018-03-02 NOTE — ED Triage Notes (Signed)
Pt from home for a fall with left arm and shoulder pain.  EMS states felt a pop over the clavicle. A&Ox4.  No thinners.  50 Mcg fentanyl given by EMS.

## 2018-03-07 DIAGNOSIS — S42022A Displaced fracture of shaft of left clavicle, initial encounter for closed fracture: Secondary | ICD-10-CM | POA: Diagnosis not present

## 2018-03-07 DIAGNOSIS — S8001XA Contusion of right knee, initial encounter: Secondary | ICD-10-CM | POA: Diagnosis not present

## 2018-03-14 ENCOUNTER — Ambulatory Visit: Payer: Medicare Other | Admitting: Internal Medicine

## 2018-03-14 VITALS — BP 136/82 | HR 72 | Temp 97.9°F | Ht 65.0 in | Wt 172.8 lb

## 2018-03-14 DIAGNOSIS — S0101XD Laceration without foreign body of scalp, subsequent encounter: Secondary | ICD-10-CM

## 2018-03-18 ENCOUNTER — Encounter: Payer: Self-pay | Admitting: Internal Medicine

## 2018-03-18 NOTE — Progress Notes (Signed)
     Patient was seen at ER about 10 days ago after a fall with fx of the Lt clavicle and a posterior scalp laceration that was stapled and she was released.     She presents today for staple removal .    BP 136/82   Pulse 72   Temp 97.9 F (36.6 C)   Ht 5\' 5"  (1.651 m)   Wt 172 lb 12.8 oz (78.4 kg)   BMI 28.76 kg/m      Wound is clean & well healed. Staples removed w/o difficulty.    ROV - PRN  (No Charge)

## 2018-04-06 DIAGNOSIS — Z23 Encounter for immunization: Secondary | ICD-10-CM | POA: Diagnosis not present

## 2018-04-10 DIAGNOSIS — S8001XA Contusion of right knee, initial encounter: Secondary | ICD-10-CM | POA: Diagnosis not present

## 2018-04-10 DIAGNOSIS — S42022A Displaced fracture of shaft of left clavicle, initial encounter for closed fracture: Secondary | ICD-10-CM | POA: Diagnosis not present

## 2018-04-12 IMAGING — RF DG ESOPHAGUS
5 series · 14 of 21 positions shown · non-contrast
Comparison: None.

CLINICAL DATA: Dysphagia, possible aspiration

EXAM:
ESOPHOGRAM / BARIUM SWALLOW / BARIUM TABLET STUDY
TECHNIQUE: Combined double contrast and single contrast examination performed
using effervescent crystals, thick barium liquid, and thin barium
liquid. The patient was observed with fluoroscopy swallowing a 13 mm
barium sulphate tablet.
FLUOROSCOPY TIME:  Fluoroscopy Time:  1 minutes 18 seconds
Radiation Exposure Index (if provided by the fluoroscopic device):
78 mGy
Number of Acquired Spot Images: 0

[Series 1: sequence · 3 of 15 frames shown (1 of 4)]
[frame 3/15]
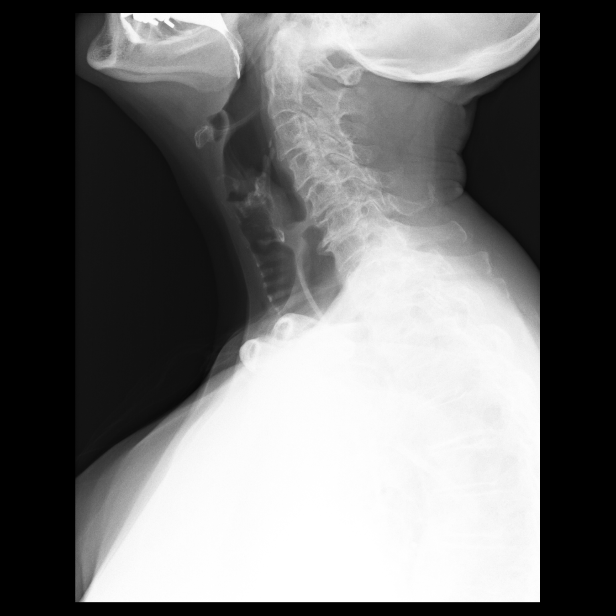
[frame 13/15]
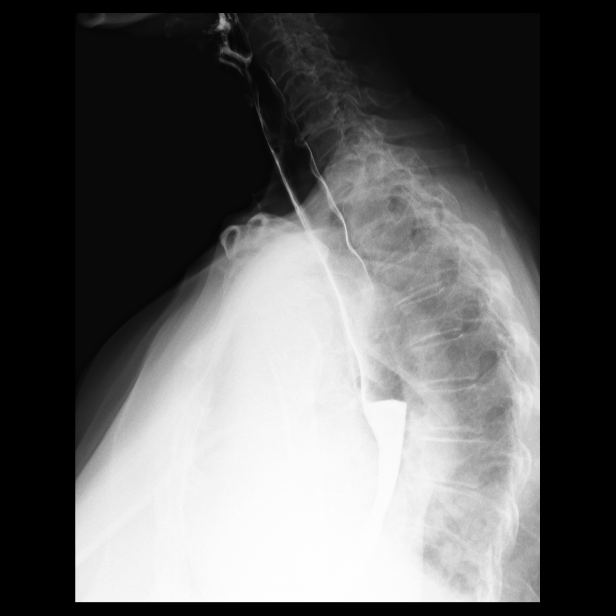
[frame 14/15]
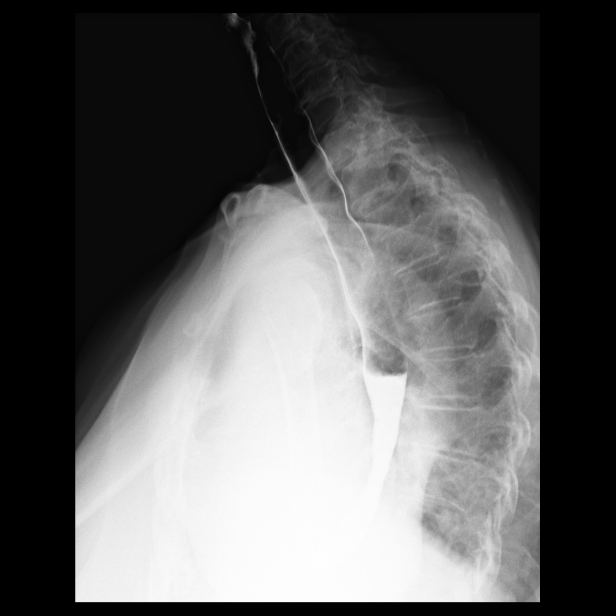

[Series 2: sequence · 2 of 23 frames shown (2 of 4)]
[frame 12/23]
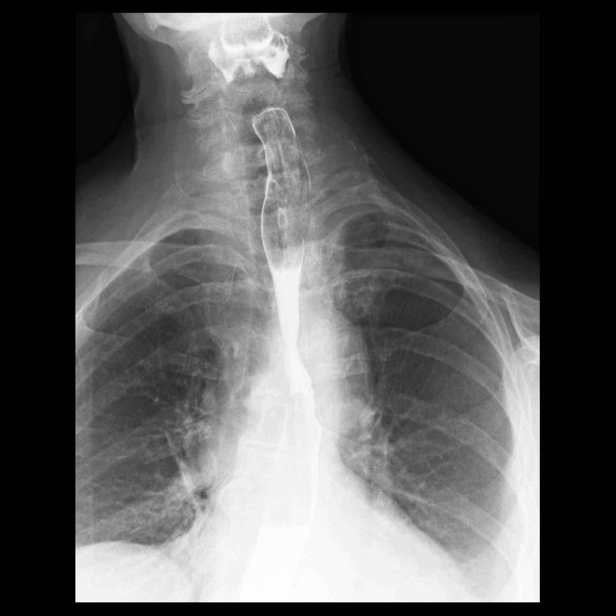
[frame 20/23]
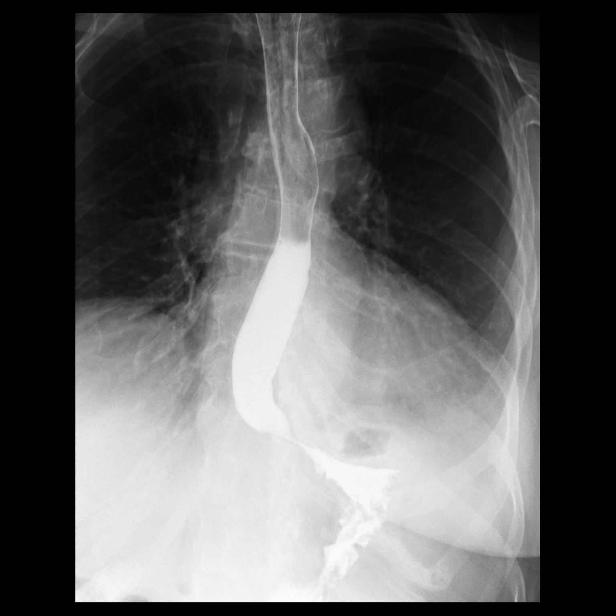

[Series 3: sequence · 3 of 9 frames shown (3 of 4)]
[frame 2/9]
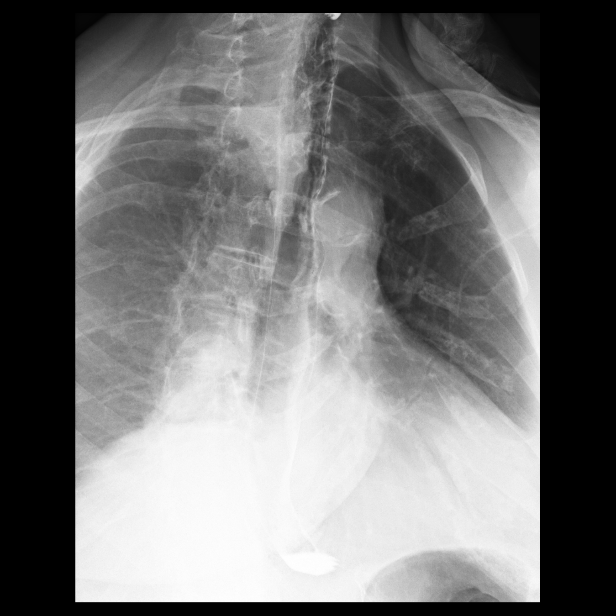
[frame 5/9]
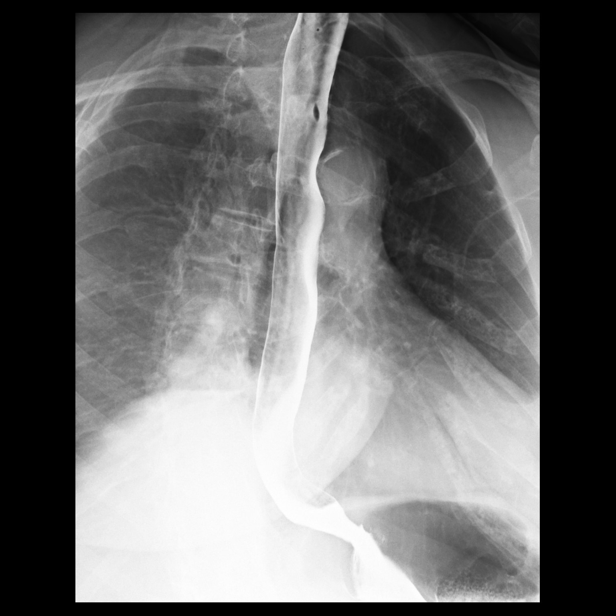
[frame 8/9]
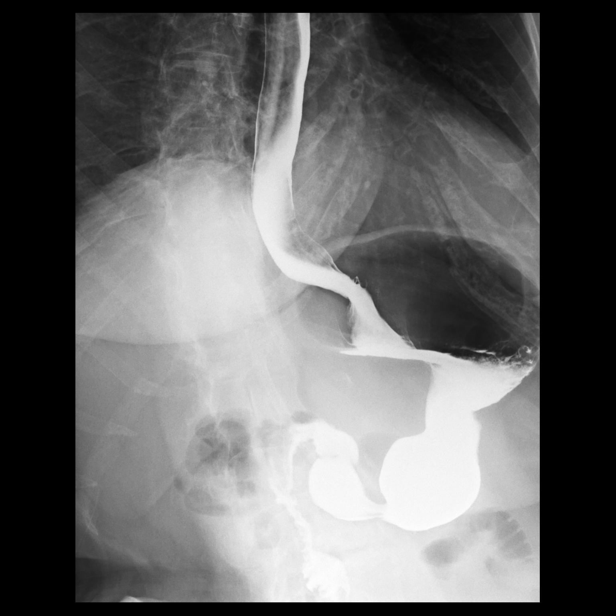

[Series 4: sequence · 3 of 43 frames shown (4 of 4)]
[frame 7/43]
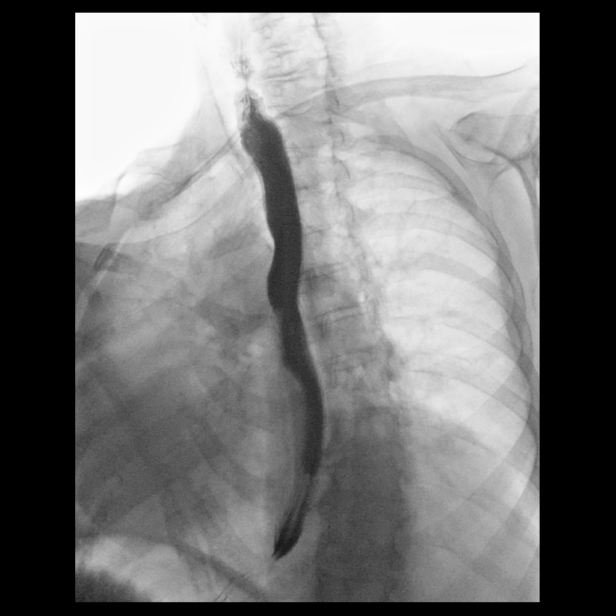
[frame 23/43]
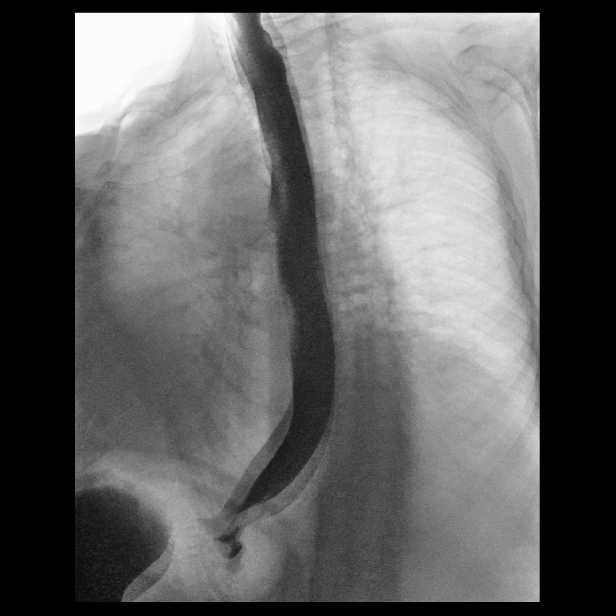
[frame 37/43]
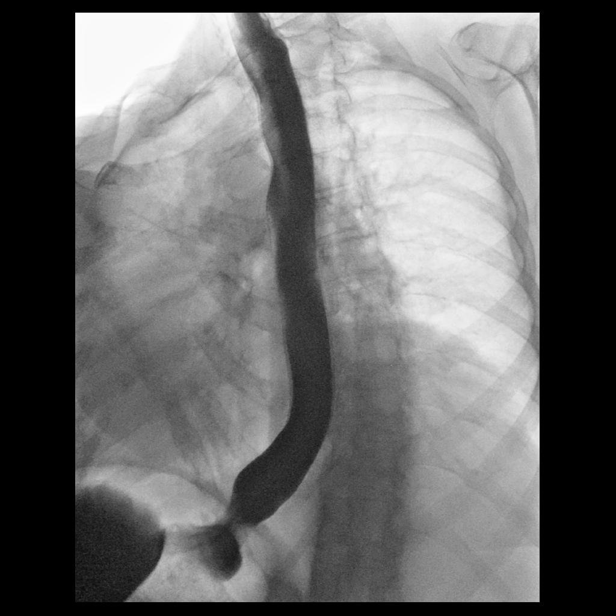

[Series 5: one shot · 3 of 5 slices shown]
[im 2/5]
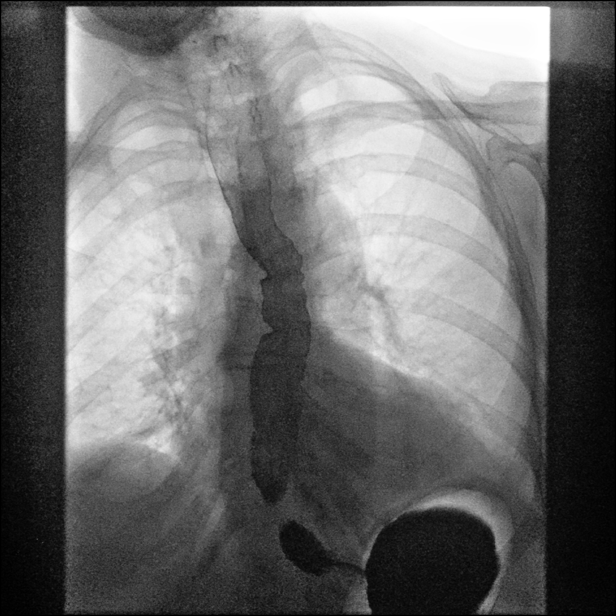
[im 3/5]
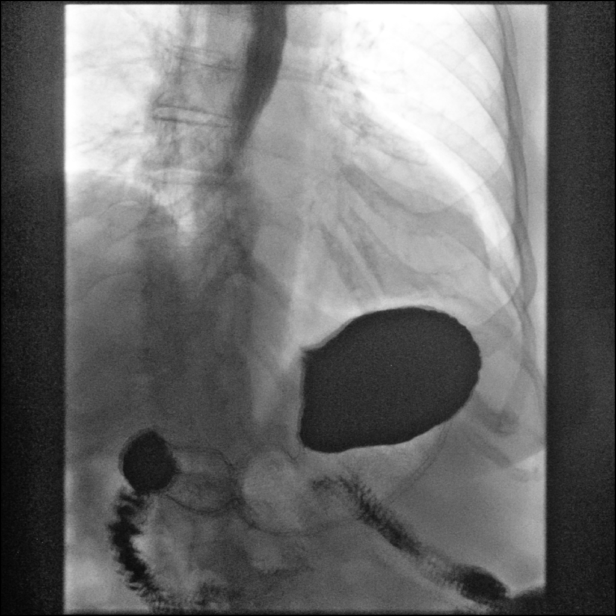
[im 5/5]
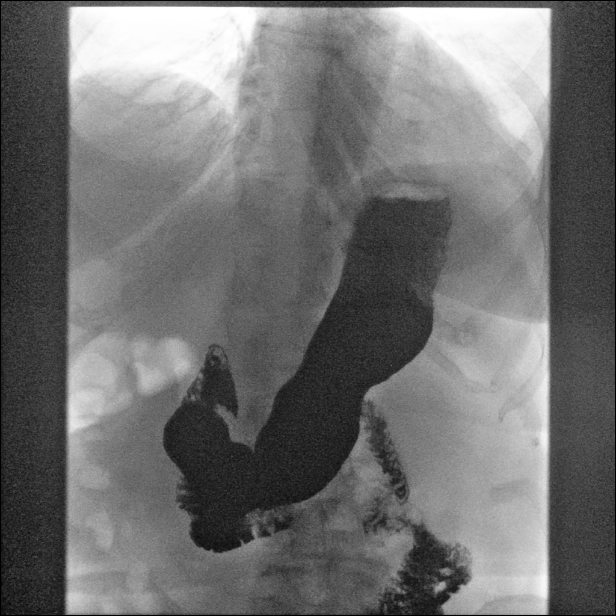

[14 of 21 positions shown; findings below may reference images not displayed]

FINDINGS: Initially the study was begun in the lateral projection to assess
for possible aspiration. The swallowing mechanism is unremarkable.
No penetration or aspiration of barium is seen. On the frontal view
there is slight indentation upon the lower cervical esophagus from
the right which could indicate prominence of the right thyroid gland
or thyroid nodule. Clinical correlation and/or ultrasound of the
thyroid is recommended. A double-contrast barium swallow does show
the mucosa of the esophagus to be unremarkable. Only mild tertiary
contractions are present within the mid and distal esophagus. No
hiatal hernia is seen. However, there is moderate gastroesophageal
reflux demonstrated. A barium pill was given at the end of the study
which passed into the stomach without delay.
IMPRESSION: 1. Moderate gastroesophageal reflux. Barium pill passes into the
stomach without delay.
2. Mild tertiary contractions.
3. Extrinsic compression of the lower cervical esophagus from the
right may indicate prominence of the right lobe of thyroid or
thyroid nodule. Consider thyroid ultrasound.
4. No penetration or aspiration is seen.

## 2018-04-16 DIAGNOSIS — S42022D Displaced fracture of shaft of left clavicle, subsequent encounter for fracture with routine healing: Secondary | ICD-10-CM | POA: Diagnosis not present

## 2018-04-17 ENCOUNTER — Ambulatory Visit: Payer: Self-pay | Admitting: Rheumatology

## 2018-04-17 DIAGNOSIS — S42022D Displaced fracture of shaft of left clavicle, subsequent encounter for fracture with routine healing: Secondary | ICD-10-CM | POA: Diagnosis not present

## 2018-04-30 ENCOUNTER — Other Ambulatory Visit: Payer: Self-pay | Admitting: Physician Assistant

## 2018-04-30 DIAGNOSIS — F411 Generalized anxiety disorder: Secondary | ICD-10-CM

## 2018-05-09 ENCOUNTER — Encounter: Payer: Self-pay | Admitting: Adult Health

## 2018-05-09 DIAGNOSIS — E663 Overweight: Secondary | ICD-10-CM

## 2018-05-09 DIAGNOSIS — Z6822 Body mass index (BMI) 22.0-22.9, adult: Secondary | ICD-10-CM | POA: Insufficient documentation

## 2018-05-09 DIAGNOSIS — Z6824 Body mass index (BMI) 24.0-24.9, adult: Secondary | ICD-10-CM | POA: Insufficient documentation

## 2018-05-09 NOTE — Progress Notes (Signed)
MEDICARE ANNUAL WELLNESS VISIT AND FOLLOW UP  Assessment:   Encounter for Medicare annual wellness exam  Essential hypertension - continue medications, DASH diet, exercise and monitor at home. Call if greater than 130/80.  -     CBC with Differential/Platelet -     CMP/GFR -     TSH  Controlled type 2 diabetes mellitus with diabetic nephropathy, without long-term current use of insulin (HCC) -     Hemoglobin A1c Discussed general issues about diabetes pathophysiology and management., Educational material distributed., Suggested low cholesterol diet., Encouraged aerobic exercise., Discussed foot care., Reminded to get yearly retinal exam.  Type 2 diabetes mellitus with stage 2 chronic kidney disease, without long-term current use of insulin (HCC) -     Hemoglobin A1c Discussed general issues about diabetes pathophysiology and management., Educational material distributed., Suggested low cholesterol diet., Encouraged aerobic exercise., Discussed foot care., Reminded to get yearly retinal exam.  Degenerative disc disease, lumbar Continue same  Primary osteoarthritis of both knees Continue same  Mixed hyperlipidemia -     Lipid panel - -continue medications, check lipids, decrease fatty foods, increase activity.   Non-Hodgkin's lymphoma, unspecified body region, unspecified non-Hodgkin lymphoma type ([place]) Monitor symptoms, in remission, released by Dr. Jana Hakim  Depression, major, recurrent, in partial remission ([place]) Continue prozac, follow up with psych Lifestyle discussed: diet/exerise, sleep hygiene, stress management, hydration  Fibromyalgia syndrome Continue medications, follow up with Dr. Estanislado Pandy  Medication management -     Magnesium  Vitamin D deficiency Continue supplement  Gastroesophageal reflux disease, esophagitis presence not specified Continue PPI/H2 blocker, diet discussed  Irritable bowel syndrome, unspecified type If not on benefiber then add it,  decrease stress,  if any worsening symptoms, blood in stool, AB pain, etc call office  Overweight Long discussion about weight loss, diet, and exercise Recommended diet heavy in fruits and veggies and low in animal meats, cheeses, and dairy products, appropriate calorie intake Discussed appropriate weight for height and initial goal Follow up at next visit  OAB Wears pull ups, improved with oxybutynin  Over 30 minutes of exam, counseling, chart review, and critical decision making was performed  Future Appointments  Date Time Provider [place]  08/20/2018  2:00 PM Unk [name], MD [facility] None    Plan:   During the course of the visit the patient was educated and counseled about appropriate screening and preventive services including:    Pneumococcal vaccine   Influenza vaccine  Td vaccine  Prevnar 13  Screening electrocardiogram  Screening mammography  Bone densitometry screening  Colorectal cancer screening  Diabetes screening  Glaucoma screening  Nutrition counseling   Advanced directives: given info/requested copies   Subjective:   Debra Short is a 77 y.o. female who presents for Medicare Annual Wellness Visit and 3 month follow up on hypertension, prediabetes, hyperlipidemia, vitamin D def.   She is thinking about applying to [employer] as she feels she is ready for extra assistance.   Non-hodgkin's was treated by Dr. Jana Hakim, in remission and monitored at this office.   Patient has a long standing history of fibromyalgia syndrome followed by Dr Estanislado Pandy and previously prescribed hydrocodone but this was d/c'd related to concerns with falling, and she has been doing outpatient therapy. She is on prozac 40 mg daily, PRN xanax for depression/anxiety and insomnia, was seeing psych but hasn't followed up recently, she takes 1/4-1/2 tab PRN and estimates 1 month supply lasts 4 months.   BMI is Body mass  index is 27.62  kg/m., she has been working on diet, exercise is limited.  Wt Readings from Last 3 Encounters:  05/14/18 166 lb (75.3 kg)  03/14/18 172 lb 12.8 oz (78.4 kg)  03/02/18 164 lb (74.4 kg)   Her blood pressure has been controlled at home, today their BP is BP: 116/74 She does not workout. She denies chest pain, shortness of breath, dizziness. She will occasionally get some walking in around the house or do stretches but is not formally exercising.    She is on cholesterol medication and denies myalgias. Her cholesterol is at goal. The cholesterol last visit was:   Lab Results  Component Value Date   CHOL 146 02/05/2018   HDL 49 (L) 02/05/2018   LDLCALC 74 02/05/2018   TRIG 148 02/05/2018   CHOLHDL 3.0 02/05/2018   She has been working on diet/exercise for T2DM well controlled in prediabetic range on metformin. She denies hypoglycemia, extremity paraesthesias, unintentional weight loss, polydipsia, polyuria, blurry vision.  Lab Results  Component Value Date   HGBA1C 5.9 (H) 02/05/2018   Last GFR Lab Results  Component Value Date   GFRNONAA 65 02/05/2018   Patient is on Vitamin D supplement. Lab Results  Component Value Date   VD25OH 69 02/05/2018        Medication Review Current Outpatient Medications on File Prior to Visit  Medication Sig Dispense Refill  . ALPRAZolam (XANAX) 1 MG tablet TAKE 1/2-1 TABLET 2-3 TIMES DAILY ONLY IF NEEDED FOR ANXIETY ATTACK-LIMIT 5DAYS/WEEK AVOID ADDICTION 90 tablet 0  . aspirin EC 81 MG tablet Take 81 mg by mouth daily.    Marland Kitchen atenolol (TENORMIN) 50 MG tablet Take 1 tablet (50 mg total) by mouth daily. 90 tablet 1  . atorvastatin (LIPITOR) 80 MG tablet TAKE 1/3 TABLET BY MOUTH DAILY FOR CHOLESTEROL. DISCONTINUE PRAVASTATIN 30 tablet 2  . bisoprolol (ZEBETA) 5 MG tablet TAKE 1 TABLET EVERY MORNING FOR BLOOD PRESSURE 90 tablet 1  . Esomeprazole Magnesium (NEXIUM PO) Take 22.3 mg by mouth See admin instructions. Take 1 capsule (22.3 mg OTC) by mouth  every morning before breakfast, may also take 1 capsule in the evening as needed for acid reflux    . FLUoxetine (PROZAC) 40 MG capsule TAKE 1 CAPSULE BY MOUTH EVERY DAY 90 capsule 1  . fluticasone (FLONASE) 50 MCG/ACT nasal spray PLACE 2 SPRAYS INTO BOTH NOSTRILS DAILY. 48 g 1  . gabapentin (NEURONTIN) 600 MG tablet Take 1/2 to 1 tablet 2 to 3 x /day as needed for Fibromyalgia 270 tablet 3  . metFORMIN (GLUCOPHAGE-XR) 500 MG 24 hr tablet Take 1 to 4 tablets daily as directed for Diabetes 360 tablet 1  . montelukast (SINGULAIR) 10 MG tablet TAKE 1 TABLET EVERY DAY AS NEEDED FOR ALLERGIES 90 tablet 1  . Multiple Vitamin (MULTIVITAMIN WITH MINERALS) TABS tablet Take 1 tablet by mouth daily.    Marland Kitchen oxybutynin (DITROPAN) 5 MG tablet TAKE 1/2 TO 1 TABLET 2 TIMES DAILY FOR BLADDER 180 tablet 12  . triamcinolone (NASACORT) 55 MCG/ACT AERO nasal inhaler Place 2 sprays into both nostrils daily as needed (for seasonal allergies).      No current facility-administered medications on file prior to visit.     Allergies: Allergies  Allergen Reactions  . Effexor [Venlafaxine] Other (See Comments)    Not effective  . Nortriptyline Other (See Comments)    Not effective  . Paxil [Paroxetine Hcl] Other (See Comments)    Not effective  . Robaxin [Methocarbamol]  Itching  . Tizanidine Itching  . Zoloft [Sertraline Hcl] Other (See Comments)    Not effective    Current Problems (verified) has Hyperlipidemia associated with type 2 diabetes mellitus ([place]); Hypertension; GERD (gastroesophageal reflux disease); Vitamin D deficiency; IBS (irritable bowel syndrome); Diabetes mellitus type 2, controlled ([place]); Non Hodgkin's lymphoma ([place]); Medication management; Insomnia; CKD stage 2 due to type 2 diabetes mellitus ([place]); Degenerative disc disease, lumbar; Primary osteoarthritis of both knees; Fibromyalgia syndrome; Depression, major, recurrent, in partial remission ([place]); Overweight (BMI 25.0-29.9); and OAB (overactive  bladder) on their problem list.  Screening Tests Immunization History  Administered Date(s) Administered  . Influenza Split 04/10/2013, 03/23/2016  . Influenza, High Dose Seasonal PF 03/24/2014, 02/27/2015  . Influenza-Unspecified 03/27/2018  . Pneumococcal Conjugate-13 03/24/2014  . Pneumococcal Polysaccharide-23 06/13/2008  . Td 09/24/2012  . Tdap 09/26/2014  . Zoster 02/23/2011    Preventative care: Last colonoscopy: 2013 Last mammogram:  10/2017  Prior vaccinations: TD or Tdap: 2016  Influenza: 03/2018  Pneumococcal: 2010 Prevnar13: 2015 Shingles/Zostavax: 2012  Names of Other Physician/Practitioners you currently use: 1.  [facility]- here for primary care 2. [facility], eye doctor, 08/22/2017 - report abstracted 3. Dr. Orion Crook, dentist, last visit q34months 2019  Patient Care Team: Unk [name], MD as PCP - General (Internal Medicine) Bo Merino, MD as Consulting Physician (Rheumatology) [name], MD as Consulting Physician (Gastroenterology) Allyn Kenner, MD (Dermatology)  Surgical: She  has a past surgical history that includes ORIF ankle fracture (Left, 09/26/2014); Abdominal hysterectomy; Tonsillectomy; and ORIF ankle fracture (Left, 09/26/2014). Family Her family history includes Cancer in her brother, father, maternal grandfather, and mother. Social history  She reports that she has never smoked. She has never used smokeless tobacco. She reports that she does not drink alcohol or use drugs.  MEDICARE WELLNESS OBJECTIVES: Physical activity: Current Exercise Habits: The patient does not participate in regular exercise at present, Exercise limited by: orthopedic condition(s);psychological condition(s) Cardiac risk factors: Cardiac Risk Factors include: advanced age (>33men, >41 women);hypertension;sedentary lifestyle;dyslipidemia Depression/mood screen:   Depression screen [facility] 2/9 05/14/2018  Decreased  Interest 1  Down, Depressed, Hopeless 2  PHQ - 2 Score 3  Altered sleeping 3  Tired, decreased energy 3  Change in appetite 0  Feeling bad or failure about yourself  0  Trouble concentrating 1  Moving slowly or fidgety/restless 0  Suicidal thoughts 0  PHQ-9 Score 10  Difficult doing work/chores Very difficult    ADLs:  In your present state of health, do you have any difficulty performing the following activities: 05/14/2018 02/05/2018  Hearing? N N  Vision? N N  Difficulty concentrating or making decisions? N N  Walking or climbing stairs? N N  Dressing or bathing? N N  Doing errands, shopping? N N  Some recent data might be hidden     Cognitive Testing  Alert? Yes  Normal Appearance?Yes  Oriented to person? Yes  Place? Yes   Time? Yes  Recall of three objects?  Yes  Can perform simple calculations? Yes  Displays appropriate judgment?Yes  Can read the correct time from a watch face?Yes  EOL planning: Does Patient Have a Medical Advance Directive?: Yes Type of Advance Directive: Healthcare Power of Attorney, Living will Does patient want to make changes to medical advance directive?: No - Patient declined Copy of [place] in Chart?: No - copy requested   Objective:   Today's Vitals   05/14/18 1444  BP: 116/74  Pulse: 71  Temp: 97.7 F (36.5 C)  SpO2: 96%  Weight: 166 lb (75.3 kg)  Height: 5\' 5"  (1.651 m)  PainSc: 10-Worst pain ever  PainLoc: Shoulder   Body mass index is 27.62 kg/m. Wt Readings from Last 3 Encounters:  05/14/18 166 lb (75.3 kg)  03/14/18 172 lb 12.8 oz (78.4 kg)  03/02/18 164 lb (74.4 kg)    General appearance: alert, no distress, WD/WN,  female HEENT: normocephalic, sclerae anicteric, TMs pearly, nares patent, no discharge or erythema, pharynx normal Oral cavity: MMM, no lesions Neck: supple, no lymphadenopathy, no thyromegaly, + nodules on thyroid left side more than right, no masses Heart: RRR, normal S1, S2, no  murmurs Lungs: CTA bilaterally, no wheezes, rhonchi, or rales Abdomen: +bs, soft, non tender, non distended, no masses, no hepatomegaly, no splenomegaly Musculoskeletal: nontender, no swelling, no obvious deformity Extremities: no edema, no cyanosis, no clubbing Pulses: 2+ symmetric, upper and lower extremities, normal cap refill Neurological: alert, oriented x 3, CN2-12 intact, strength normal upper extremities and lower extremities, sensation normal throughout, DTRs 2+ throughout, no cerebellar signs, gait slow Psychiatric: normal affect, behavior normal, pleasant  Breast: defer Gyn: defer Rectal: defer   Medicare Attestation I have personally reviewed: The patient's medical and social history Their use of alcohol, tobacco or illicit drugs Their current medications and supplements The patient's functional ability including ADLs,fall risks, home safety risks, cognitive, and hearing and visual impairment Diet and physical activities Evidence for depression or mood disorders  The patient's weight, height, BMI, and visual acuity have been recorded in the chart.  I have made referrals, counseling, and provided education to the patient based on review of the above and I have provided the patient with a written personalized care plan for preventive services.     Izora Ribas, NP   05/14/2018

## 2018-05-14 ENCOUNTER — Ambulatory Visit (INDEPENDENT_AMBULATORY_CARE_PROVIDER_SITE_OTHER): Payer: Medicare Other | Admitting: Adult Health

## 2018-05-14 ENCOUNTER — Encounter: Payer: Self-pay | Admitting: Adult Health

## 2018-05-14 VITALS — BP 116/74 | HR 71 | Temp 97.7°F | Ht 65.0 in | Wt 166.0 lb

## 2018-05-14 DIAGNOSIS — C8590 Non-Hodgkin lymphoma, unspecified, unspecified site: Secondary | ICD-10-CM

## 2018-05-14 DIAGNOSIS — N182 Chronic kidney disease, stage 2 (mild): Secondary | ICD-10-CM

## 2018-05-14 DIAGNOSIS — G4700 Insomnia, unspecified: Secondary | ICD-10-CM | POA: Diagnosis not present

## 2018-05-14 DIAGNOSIS — Z79899 Other long term (current) drug therapy: Secondary | ICD-10-CM | POA: Diagnosis not present

## 2018-05-14 DIAGNOSIS — M5136 Other intervertebral disc degeneration, lumbar region: Secondary | ICD-10-CM | POA: Diagnosis not present

## 2018-05-14 DIAGNOSIS — E559 Vitamin D deficiency, unspecified: Secondary | ICD-10-CM

## 2018-05-14 DIAGNOSIS — K589 Irritable bowel syndrome without diarrhea: Secondary | ICD-10-CM | POA: Diagnosis not present

## 2018-05-14 DIAGNOSIS — E1122 Type 2 diabetes mellitus with diabetic chronic kidney disease: Secondary | ICD-10-CM | POA: Diagnosis not present

## 2018-05-14 DIAGNOSIS — Z0000 Encounter for general adult medical examination without abnormal findings: Secondary | ICD-10-CM

## 2018-05-14 DIAGNOSIS — M797 Fibromyalgia: Secondary | ICD-10-CM

## 2018-05-14 DIAGNOSIS — R6889 Other general symptoms and signs: Secondary | ICD-10-CM | POA: Diagnosis not present

## 2018-05-14 DIAGNOSIS — M170 Bilateral primary osteoarthritis of knee: Secondary | ICD-10-CM

## 2018-05-14 DIAGNOSIS — E785 Hyperlipidemia, unspecified: Secondary | ICD-10-CM

## 2018-05-14 DIAGNOSIS — M51369 Other intervertebral disc degeneration, lumbar region without mention of lumbar back pain or lower extremity pain: Secondary | ICD-10-CM

## 2018-05-14 DIAGNOSIS — N3281 Overactive bladder: Secondary | ICD-10-CM

## 2018-05-14 DIAGNOSIS — F3341 Major depressive disorder, recurrent, in partial remission: Secondary | ICD-10-CM

## 2018-05-14 DIAGNOSIS — E663 Overweight: Secondary | ICD-10-CM

## 2018-05-14 DIAGNOSIS — E1169 Type 2 diabetes mellitus with other specified complication: Secondary | ICD-10-CM

## 2018-05-14 DIAGNOSIS — E1121 Type 2 diabetes mellitus with diabetic nephropathy: Secondary | ICD-10-CM | POA: Diagnosis not present

## 2018-05-14 DIAGNOSIS — Z0001 Encounter for general adult medical examination with abnormal findings: Secondary | ICD-10-CM

## 2018-05-14 DIAGNOSIS — K219 Gastro-esophageal reflux disease without esophagitis: Secondary | ICD-10-CM | POA: Diagnosis not present

## 2018-05-14 DIAGNOSIS — I10 Essential (primary) hypertension: Secondary | ICD-10-CM

## 2018-05-14 NOTE — Patient Instructions (Signed)
Ms. Debra Short , Thank you for taking time to come for your Medicare Wellness Visit. I appreciate your ongoing commitment to your health goals. Please review the following plan we discussed and let me know if I can assist you in the future.   This is a list of the screening recommended for you and due dates:  Health Maintenance  Topic Date Due  . Complete foot exam   04/25/2018  . Urine Protein Check  07/27/2018  . Hemoglobin A1C  08/08/2018  . Eye exam for diabetics  08/23/2018  . Tetanus Vaccine  09/25/2024  . Flu Shot  Completed  . DEXA scan (bone density measurement)  Completed  . Pneumonia vaccines  Completed      Myofascial Pain Syndrome and Fibromyalgia Myofascial pain syndrome and fibromyalgia are both pain disorders. This pain may be felt mainly in your muscles.  Myofascial pain syndrome: ? Always has trigger points or tender points in the muscle that will cause pain when pressed. The pain may come and go. ? Usually affects your neck, upper back, and shoulder areas. The pain often radiates into your arms and hands.  Fibromyalgia: ? Has muscle pains and tenderness that come and go. ? Is often associated with fatigue and sleep disturbances. ? Has trigger points. ? Tends to be long-lasting (chronic), but is not life-threatening.  Fibromyalgia and myofascial pain are not the same. However, they often occur together. If you have both conditions, each can make the other worse. Both are common and can cause enough pain and fatigue to make day-to-day activities difficult. What are the causes? The exact causes of fibromyalgia and myofascial pain are not known. People with certain gene types may be more likely to develop fibromyalgia. Some factors can be triggers for both conditions, such as:  Spine disorders.  Arthritis.  Severe injury (trauma) and other physical stressors.  Being under a lot of stress.  A medical illness.  What are the signs or symptoms? Fibromyalgia The  main symptom of fibromyalgia is widespread pain and tenderness in your muscles. This can vary over time. Pain is sometimes described as stabbing, shooting, or burning. You may have tingling or numbness, too. You may also have sleep problems and fatigue. You may wake up feeling tired and groggy (fibro fog). Other symptoms may include:  Bowel and bladder problems.  Headaches.  Visual problems.  Problems with odors and noises.  Depression or mood changes.  Painful menstrual periods (dysmenorrhea).  Dry skin or eyes.  Myofascial pain syndrome Symptoms of myofascial pain syndrome include:  Tight, ropy bands of muscle.  Uncomfortable sensations in muscular areas, such as: ? Aching. ? Cramping. ? Burning. ? Numbness. ? Tingling. ? Muscle weakness.  Trouble moving certain muscles freely (range of motion).  How is this diagnosed? There are no specific tests to diagnose fibromyalgia or myofascial pain syndrome. Both can be hard to diagnose because their symptoms are common in many other conditions. Your health care provider may suspect one or both of these conditions based on your symptoms and medical history. Your health care provider will also do a physical exam. The key to diagnosing fibromyalgia is having pain, fatigue, and other symptoms for more than three months that cannot be explained by another condition. The key to diagnosing myofascial pain syndrome is finding trigger points in muscles that are tender and cause pain elsewhere in your body (referred pain). How is this treated? Treating fibromyalgia and myofascial pain often requires a team of health care providers.  This usually starts with your primary provider and a physical therapist. You may also find it helpful to work with alternative health care providers, such as massage therapists or acupuncturists. Treatment for fibromyalgia may include medicines. This may include nonsteroidal anti-inflammatory drugs (NSAIDs), along  with other medicines. Treatment for myofascial pain may also include:  NSAIDs.  Cooling and stretching of muscles.  Trigger point injections.  Sound wave (ultrasound) treatments to stimulate muscles.  Follow these instructions at home:  Take medicines only as directed by your health care provider.  Exercise as directed by your health care provider or physical therapist.  Try to avoid stressful situations.  Practice relaxation techniques to control your stress. You may want to try: ? Biofeedback. ? Visual imagery. ? Hypnosis. ? Muscle relaxation. ? Yoga. ? Meditation.  Talk to your health care provider about alternative treatments, such as acupuncture or massage treatment.  Maintain a healthy lifestyle. This includes eating a healthy diet and getting enough sleep.  Consider joining a support group.  Do not do activities that stress or strain your muscles. That includes repetitive motions and heavy lifting. Where to find more information:  National Fibromyalgia Association: www.fmaware.[place]: www.arthritis.org  American Chronic Pain Association: [employer] Contact a health care provider if:  You have new symptoms.  Your symptoms get worse.  You have side effects from your medicines.  You have trouble sleeping.  Your condition is causing depression or anxiety. This information is not intended to replace advice given to you by your health care provider. Make sure you discuss any questions you have with your health care provider. Document Released: 05/30/2005 Document Revised: 11/05/2015 Document Reviewed: 03/05/2014 Elsevier Interactive Patient Education  [employer].

## 2018-05-15 LAB — CBC WITH DIFFERENTIAL/PLATELET
Basophils Absolute: 43 cells/uL (ref 0–200)
Basophils Relative: 0.7 %
Eosinophils Absolute: 229 cells/uL (ref 15–500)
Eosinophils Relative: 3.7 %
HCT: 39 % (ref 35.0–45.0)
Hemoglobin: 13.2 g/dL (ref 11.7–15.5)
Lymphs Abs: 1705 cells/uL (ref 850–3900)
MCH: 30.9 pg (ref 27.0–33.0)
MCHC: 33.8 g/dL (ref 32.0–36.0)
MCV: 91.3 fL (ref 80.0–100.0)
MPV: 9.9 fL (ref 7.5–12.5)
Monocytes Relative: 8.5 %
Neutro Abs: 3695 cells/uL (ref 1500–7800)
Neutrophils Relative %: 59.6 %
Platelets: 286 10*3/uL (ref 140–400)
RBC: 4.27 10*6/uL (ref 3.80–5.10)
RDW: 12.5 % (ref 11.0–15.0)
Total Lymphocyte: 27.5 %
WBC mixed population: 527 cells/uL (ref 200–950)
WBC: 6.2 10*3/uL (ref 3.8–10.8)

## 2018-05-15 LAB — COMPLETE METABOLIC PANEL WITH GFR
AG Ratio: 1.6 (calc) (ref 1.0–2.5)
ALT: 19 U/L (ref 6–29)
AST: 17 U/L (ref 10–35)
Albumin: 4.4 g/dL (ref 3.6–5.1)
Alkaline phosphatase (APISO): 57 U/L (ref 33–130)
BUN: 23 mg/dL (ref 7–25)
CO2: 30 mmol/L (ref 20–32)
Calcium: 10.4 mg/dL (ref 8.6–10.4)
Chloride: 101 mmol/L (ref 98–110)
Creat: 0.84 mg/dL (ref 0.60–0.93)
GFR, Est African American: 77 mL/min/{1.73_m2} (ref >=60)
GFR, Est Non African American: 66 mL/min/{1.73_m2} (ref >=60)
Globulin: 2.7 g/dL (calc) (ref 1.9–3.7)
Glucose, Bld: 90 mg/dL (ref 65–99)
Potassium: 5.1 mmol/L (ref 3.5–5.3)
Sodium: 142 mmol/L (ref 135–146)
Total Bilirubin: 0.9 mg/dL (ref 0.2–1.2)
Total Protein: 7.1 g/dL (ref 6.1–8.1)

## 2018-05-15 LAB — LIPID PANEL
Cholesterol: 126 mg/dL (ref <200)
HDL: 44 mg/dL — ABNORMAL LOW (ref >50)
LDL Cholesterol (Calc): 59 mg/dL (calc)
Non-HDL Cholesterol (Calc): 82 mg/dL (calc) (ref <130)
Total CHOL/HDL Ratio: 2.9 (calc) (ref <5.0)
Triglycerides: 142 mg/dL (ref <150)

## 2018-05-15 LAB — HEMOGLOBIN A1C
Hgb A1c MFr Bld: 5.9 % of total Hgb — ABNORMAL HIGH (ref <5.7)
Mean Plasma Glucose: 123 (calc)
eAG (mmol/L): 6.8 (calc)

## 2018-05-15 LAB — MAGNESIUM: Magnesium: 1.6 mg/dL (ref 1.5–2.5)

## 2018-05-15 LAB — TSH: TSH: 3.1 mIU/L (ref 0.40–4.50)

## 2018-06-15 ENCOUNTER — Other Ambulatory Visit: Payer: Self-pay | Admitting: *Deleted

## 2018-06-15 ENCOUNTER — Other Ambulatory Visit: Payer: Self-pay | Admitting: Internal Medicine

## 2018-06-15 MED ORDER — FLUTICASONE PROPIONATE 50 MCG/ACT NA SUSP: 2.0000 | Freq: Every day | NASAL | 1 refills | Status: DC

## 2018-07-02 ENCOUNTER — Other Ambulatory Visit: Payer: Self-pay | Admitting: Internal Medicine

## 2018-07-02 ENCOUNTER — Other Ambulatory Visit: Payer: Self-pay

## 2018-07-02 DIAGNOSIS — E1121 Type 2 diabetes mellitus with diabetic nephropathy: Secondary | ICD-10-CM

## 2018-07-02 DIAGNOSIS — M797 Fibromyalgia: Secondary | ICD-10-CM

## 2018-07-02 DIAGNOSIS — F411 Generalized anxiety disorder: Secondary | ICD-10-CM

## 2018-07-02 MED ORDER — METFORMIN HCL ER 500 MG PO TB24: ORAL_TABLET | ORAL | 1 refills | Status: DC

## 2018-07-02 MED ORDER — GABAPENTIN 600 MG PO TABS: ORAL_TABLET | ORAL | 1 refills | Status: DC

## 2018-08-20 ENCOUNTER — Encounter: Payer: Self-pay | Admitting: Internal Medicine

## 2018-08-20 ENCOUNTER — Ambulatory Visit (INDEPENDENT_AMBULATORY_CARE_PROVIDER_SITE_OTHER): Payer: PPO | Admitting: Internal Medicine

## 2018-08-20 VITALS — BP 126/74 | HR 64 | Temp 97.6°F | Resp 16 | Ht 64.75 in | Wt 156.2 lb

## 2018-08-20 DIAGNOSIS — K219 Gastro-esophageal reflux disease without esophagitis: Secondary | ICD-10-CM

## 2018-08-20 DIAGNOSIS — Z0000 Encounter for general adult medical examination without abnormal findings: Secondary | ICD-10-CM | POA: Diagnosis not present

## 2018-08-20 DIAGNOSIS — E785 Hyperlipidemia, unspecified: Secondary | ICD-10-CM

## 2018-08-20 DIAGNOSIS — E782 Mixed hyperlipidemia: Secondary | ICD-10-CM

## 2018-08-20 DIAGNOSIS — Z0001 Encounter for general adult medical examination with abnormal findings: Secondary | ICD-10-CM

## 2018-08-20 DIAGNOSIS — Z136 Encounter for screening for cardiovascular disorders: Secondary | ICD-10-CM

## 2018-08-20 DIAGNOSIS — Z1211 Encounter for screening for malignant neoplasm of colon: Secondary | ICD-10-CM

## 2018-08-20 DIAGNOSIS — E1169 Type 2 diabetes mellitus with other specified complication: Secondary | ICD-10-CM | POA: Diagnosis not present

## 2018-08-20 DIAGNOSIS — E1122 Type 2 diabetes mellitus with diabetic chronic kidney disease: Secondary | ICD-10-CM

## 2018-08-20 DIAGNOSIS — E559 Vitamin D deficiency, unspecified: Secondary | ICD-10-CM

## 2018-08-20 DIAGNOSIS — N182 Chronic kidney disease, stage 2 (mild): Secondary | ICD-10-CM

## 2018-08-20 DIAGNOSIS — Z1212 Encounter for screening for malignant neoplasm of rectum: Secondary | ICD-10-CM

## 2018-08-20 DIAGNOSIS — I10 Essential (primary) hypertension: Secondary | ICD-10-CM | POA: Diagnosis not present

## 2018-08-20 DIAGNOSIS — Z79899 Other long term (current) drug therapy: Secondary | ICD-10-CM

## 2018-08-20 NOTE — Progress Notes (Signed)
[facility] Unk [name], M.D.     Uvaldo Bristle. Silverio Lay, P.A.-C Liane Comber, [place] [address] [place], [place] [address] Telephone [phone] Telefax [phone] Annual Screening/Preventative Visit & Comprehensive Evaluation &  Examination     This very nice 78 y.o. WWF presents for a Screening /Preventative Visit & comprehensive evaluation and management of multiple medical co-morbidities.  Patient has been followed for HTN, HLD, T2_DM  and Vitamin D Deficiency. Patient is followed by Dr Estanislado Pandy for Fibromyalgia.      HTN predates circa 2012. Patient's BP has been controlled at home and patient denies any cardiac symptoms as chest pain, palpitations, shortness of breath, dizziness or ankle swelling. Today's BP is at goal - 126/74.      Patient's hyperlipidemia is controlled with diet and Atorvastatin. Patient denies myalgias or other medication SE's. Last lipids were at goal: Lab Results  Component Value Date   CHOL 126 05/14/2018   HDL 44 (L) 05/14/2018   LDLCALC 59 05/14/2018   TRIG 142 05/14/2018   CHOLHDL 2.9 05/14/2018      Patient has hx/o T2_NIDDM predating since 2016 and was started on Metformin.      and patient denies reactive hypoglycemic symptoms, visual blurring, diabetic polys or paresthesias. Last A1c was still not at goal:  Lab Results  Component Value Date   HGBA1C 5.9 (H) 05/14/2018      Finally, patient has history of Vitamin D Deficiency ("25" / 2009)  and last Vitamin D was at goal: Lab Results  Component Value Date   VD25OH 69 02/05/2018   Current Outpatient Medications on File Prior to Visit  Medication Sig  . ALPRAZolam (XANAX) 1 MG tablet TAKE 1/2-1 TABLET 2-3 TIMES DAILY ONLY IF NEEDED FOR ANXIETY ATTACK-LIMIT 5DAYS/WEEK AVOID ADDICTION  . aspirin EC 81 MG tablet Take 81 mg by mouth daily.  Marland Kitchen atenolol (TENORMIN) 50 MG tablet Take 1 tablet (50 mg total) by mouth  daily.  Marland Kitchen atorvastatin (LIPITOR) 80 MG tablet TAKE 1/3 TABLET BY MOUTH DAILY FOR CHOLESTEROL. DISCONTINUE PRAVASTATIN  . bisoprolol (ZEBETA) 5 MG tablet TAKE 1 TABLET EVERY MORNING FOR BLOOD PRESSURE  . Esomeprazole Magnesium (NEXIUM PO) Take 22.3 mg by mouth See admin instructions. Take 1 capsule (22.3 mg OTC) by mouth every morning before breakfast, may also take 1 capsule in the evening as needed for acid reflux  . FLUoxetine (PROZAC) 40 MG capsule TAKE 1 CAPSULE BY MOUTH EVERY DAY  . fluticasone (FLONASE) 50 MCG/ACT nasal spray Place 2 sprays into both nostrils daily.  Marland Kitchen gabapentin (NEURONTIN) 600 MG tablet Take 1/2 to 1 tablet 2 to 3 x /day as needed for Fibromyalgia  . metFORMIN (GLUCOPHAGE-XR) 500 MG 24 hr tablet Take 1 to 4 tablets daily as directed for Diabetes  . montelukast (SINGULAIR) 10 MG tablet TAKE 1 TABLET EVERY DAY AS NEEDED FOR ALLERGIES  . Multiple Vitamin (MULTIVITAMIN WITH MINERALS) TABS tablet Take 1 tablet by mouth daily.  Marland Kitchen oxybutynin (DITROPAN) 5 MG tablet TAKE 1/2 TO 1 TABLET 2 TIMES DAILY FOR BLADDER  . triamcinolone (NASACORT) 55 MCG/ACT AERO nasal inhaler Place 2 sprays into both nostrils daily as needed (for seasonal allergies).    No current facility-administered medications on file prior to visit.    Allergies  Allergen Reactions  . Effexor [Venlafaxine] Other (See Comments)    Not effective  . Nortriptyline Other (See Comments)    Not effective  . Paxil [Paroxetine Hcl] Other (  See Comments)    Not effective  . Robaxin [Methocarbamol] Itching  . Tizanidine Itching  . Zoloft [Sertraline Hcl] Other (See Comments)    Not effective   Past Medical History:  Diagnosis Date  . Allergy   . Depression with anxiety 10/06/2014  . Diabetes mellitus without complication ([place])   . Fibromyalgia   . GERD (gastroesophageal reflux disease)   . History of migraine 08/25/2016  . Hyperlipidemia   . Hypertension   . IBS (irritable bowel syndrome)   . 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 Vitamin D deficiency    Health Maintenance  Topic Date Due  . URINE MICROALBUMIN  07/27/2018  . OPHTHALMOLOGY EXAM  08/23/2018  . HEMOGLOBIN A1C  11/13/2018  . FOOT EXAM  08/20/2019  . TETANUS/TDAP  09/25/2024  . INFLUENZA VACCINE  Completed  . DEXA SCAN  Completed  . PNA vac Low Risk Adult  Completed   Immunization History  Administered Date(s) Administered  . Influenza Split 04/10/2013, 03/23/2016  . Influenza, High Dose Seasonal PF 03/24/2014, 02/27/2015  . Influenza-Unspecified 03/27/2018  . Pneumococcal Conjugate-13 03/24/2014  . Pneumococcal Polysaccharide-23 06/13/2008  . Td 09/24/2012  . Tdap 09/26/2014  . Zoster 02/23/2011   Last Colon -  08/08/2011 Colon - Arty Baumgartner - recc repeat 5 years -   Last MGM - 11/08/2017  Past Surgical History:  Procedure Laterality Date  . ABDOMINAL HYSTERECTOMY    . ORIF ANKLE FRACTURE Left 09/26/2014  . ORIF ANKLE FRACTURE Left 09/26/2014   Procedure: OPEN REDUCTION INTERNAL FIXATION (ORIF) ANKLE FRACTURE;  Surgeon: [name], MD;  Location: [facility];  Service: Orthopedics;  Laterality: Left;  . TONSILLECTOMY     Family History  Problem Relation Age of Onset  . Cancer Mother   . Cancer Father   . Cancer Brother   . Cancer Maternal Grandfather    Social History   Tobacco Use  . Smoking status: Never Smoker  . Smokeless tobacco: Never Used  Substance Use Topics  . Alcohol use: No  . Drug use: No    ROS Constitutional: Denies fever, chills, weight loss/gain, headaches, insomnia,  night sweats, and change in appetite. Does c/o fatigue. Eyes: Denies redness, blurred vision, diplopia, discharge, itchy, watery eyes.  ENT: Denies discharge, congestion, post nasal drip, epistaxis, sore throat, earache, hearing loss, dental pain, Tinnitus, Vertigo, Sinus pain, snoring.  Cardio: Denies chest pain, palpitations, irregular heartbeat, syncope, dyspnea, diaphoresis, orthopnea, PND, claudication, edema Respiratory:  denies cough, dyspnea, DOE, pleurisy, hoarseness, laryngitis, wheezing.  Gastrointestinal: Denies dysphagia, heartburn, reflux, water brash, pain, cramps, nausea, vomiting, bloating, diarrhea, constipation, hematemesis, melena, hematochezia, jaundice, hemorrhoids Genitourinary: Denies dysuria, frequency, urgency, nocturia, hesitancy, discharge, hematuria, flank pain Breast: Breast lumps, nipple discharge, bleeding.  Musculoskeletal: Denies arthralgia, myalgia, stiffness, Jt. Swelling, pain, limp, and strain/sprain. Denies falls. Skin: Denies puritis, rash, hives, warts, acne, eczema, changing in skin lesion Neuro: No weakness, tremor, incoordination, spasms, paresthesia, pain Psychiatric: Denies confusion, memory loss, sensory loss. Denies Depression. Endocrine: Denies change in weight, skin, hair change, nocturia, and paresthesia, diabetic polys, visual blurring, hyper / hypo glycemic episodes.  Heme/Lymph: No excessive bleeding, bruising, enlarged lymph nodes.  Physical Exam  BP 126/74   Pulse 64   Temp 97.6 F (36.4 C)   Resp 16   Ht 5' 4.75" (1.645 m)   Wt 156 lb 3.2 oz (70.9 kg)   BMI 26.19 kg/m   General Appearance: Well nourished, well groomed and in no apparent distress.  Eyes: PERRLA, EOMs,  conjunctiva no swelling or erythema, normal fundi and vessels. Sinuses: No frontal/maxillary tenderness ENT/Mouth: EACs patent / TMs  nl. Nares clear without erythema, swelling, mucoid exudates. Oral hygiene is good. No erythema, swelling, or exudate. Tongue normal, non-obstructing. Tonsils not swollen or erythematous. Hearing normal.  Neck: Supple, thyroid not palpable. No bruits, nodes or JVD. Respiratory: Respiratory effort normal.  BS equal and clear bilateral without rales, rhonci, wheezing or stridor. Cardio: Heart sounds are normal with regular rate and rhythm and no murmurs, rubs or gallops. Peripheral pulses are normal and equal bilaterally without edema. No aortic or femoral  bruits. Chest: symmetric with normal excursions and percussion. Breasts: Symmetric, without lumps, nipple discharge, retractions, or fibrocystic changes.  Abdomen: Flat, soft with bowel sounds active. Nontender, no guarding, rebound, hernias, masses, or organomegaly.  Lymphatics: Non tender without lymphadenopathy.  Musculoskeletal: Full ROM all peripheral extremities, joint stability, 5/5 strength, and normal gait. Skin: Warm and dry without rashes, lesions, cyanosis, clubbing or  ecchymosis.  Neuro: Cranial nerves intact, reflexes equal bilaterally. Normal muscle tone, no cerebellar symptoms. Sensation intact.  Pysch: Alert and oriented X 3, normal affect, Insight and Judgment appropriate.   Assessment and Plan  1. Annual Preventative Screening Examination  2. Essential hypertension  - EKG 12-Lead - Urinalysis, Routine w reflex microscopic - Microalbumin / creatinine urine ratio - CBC with Differential/Platelet - COMPLETE METABOLIC PANEL WITH GFR - Magnesium - TSH  3. Hyperlipidemia, mixed  - EKG 12-Lead - Lipid panel - TSH  4. Type 2 diabetes mellitus with stage 2 chronic kidney disease, without long-term current use of insulin (HCC)  - EKG 12-Lead - Urinalysis, Routine w reflex microscopic - Microalbumin / creatinine urine ratio - HM DIABETES FOOT EXAM - LOW EXTREMITY NEUR EXAM DOCUM - Hemoglobin A1c - Insulin, random  5. Vitamin D deficiency  - VITAMIN D 25 Hydroxyl  6. CKD stage 2 due to type 2 diabetes mellitus (HCC)  - EKG 12-Lead - Urinalysis, Routine w reflex microscopic - Microalbumin / creatinine urine ratio - COMPLETE METABOLIC PANEL WITH GFR - Hemoglobin A1c - Insulin, random  7. Hyperlipidemia associated with type 2 diabetes mellitus (HCC)  - Lipid panel - Hemoglobin A1c - Insulin, random  8. Gastroesophageal reflux disease  - CBC with Differential/Platelet  9. Screening for colorectal cancer  - POC Hemoccult Bld/Stl  10. Screening for  ischemic heart disease  - EKG 12-Lead  11. Medication management  - Urinalysis, Routine w reflex microscopic - Microalbumin / creatinine urine ratio - CBC with Differential/Platelet - COMPLETE METABOLIC PANEL WITH GFR - Magnesium - Lipid panel - TSH - Hemoglobin A1c - Insulin, random - VITAMIN D 25 Hydroxyl       Patient was counseled in prudent diet to achieve/maintain BMI less than 25 for weight control, BP monitoring, regular exercise and medications. Discussed med's effects and SE's. Screening labs and tests as requested with regular follow-up as recommended. Over 40 minutes of exam, counseling, chart review and high complex critical decision making was performed.

## 2018-08-20 NOTE — Patient Instructions (Signed)

## 2018-08-21 LAB — CBC WITH DIFFERENTIAL/PLATELET
Absolute Monocytes: 510 cells/uL (ref 200–950)
Basophils Absolute: 48 cells/uL (ref 0–200)
Basophils Relative: 0.7 %
Eosinophils Absolute: 129 cells/uL (ref 15–500)
Eosinophils Relative: 1.9 %
HCT: 41.6 % (ref 35.0–45.0)
Hemoglobin: 14.2 g/dL (ref 11.7–15.5)
Lymphs Abs: 1748 cells/uL (ref 850–3900)
MCH: 32 pg (ref 27.0–33.0)
MCHC: 34.1 g/dL (ref 32.0–36.0)
MCV: 93.7 fL (ref 80.0–100.0)
MPV: 10 fL (ref 7.5–12.5)
Monocytes Relative: 7.5 %
Neutro Abs: 4366 cells/uL (ref 1500–7800)
Neutrophils Relative %: 64.2 %
Platelets: 306 10*3/uL (ref 140–400)
RBC: 4.44 10*6/uL (ref 3.80–5.10)
RDW: 12.9 % (ref 11.0–15.0)
Total Lymphocyte: 25.7 %
WBC: 6.8 10*3/uL (ref 3.8–10.8)

## 2018-08-21 LAB — MICROALBUMIN / CREATININE URINE RATIO
Creatinine, Urine: 239 mg/dL (ref 20–275)
Microalb Creat Ratio: 10 mcg/mg creat (ref <30)
Microalb, Ur: 2.5 mg/dL

## 2018-08-21 LAB — COMPLETE METABOLIC PANEL WITH GFR
AG Ratio: 1.7 (calc) (ref 1.0–2.5)
ALT: 16 U/L (ref 6–29)
AST: 21 U/L (ref 10–35)
Albumin: 4.7 g/dL (ref 3.6–5.1)
Alkaline phosphatase (APISO): 57 U/L (ref 37–153)
BUN: 17 mg/dL (ref 7–25)
CO2: 32 mmol/L (ref 20–32)
Calcium: 10.6 mg/dL — ABNORMAL HIGH (ref 8.6–10.4)
Chloride: 100 mmol/L (ref 98–110)
Creat: 0.79 mg/dL (ref 0.60–0.93)
GFR, Est African American: 83 mL/min/{1.73_m2} (ref >=60)
GFR, Est Non African American: 71 mL/min/{1.73_m2} (ref >=60)
Globulin: 2.8 g/dL (calc) (ref 1.9–3.7)
Glucose, Bld: 91 mg/dL (ref 65–99)
Potassium: 4.8 mmol/L (ref 3.5–5.3)
Sodium: 142 mmol/L (ref 135–146)
Total Bilirubin: 1.6 mg/dL — ABNORMAL HIGH (ref 0.2–1.2)
Total Protein: 7.5 g/dL (ref 6.1–8.1)

## 2018-08-21 LAB — URINALYSIS, ROUTINE W REFLEX MICROSCOPIC
Bacteria, UA: NONE SEEN /HPF
Bilirubin Urine: NEGATIVE
Glucose, UA: NEGATIVE
Hgb urine dipstick: NEGATIVE
Hyaline Cast: NONE SEEN /LPF
Nitrite: NEGATIVE
Specific Gravity, Urine: 1.027 (ref 1.001–1.03)
pH: 5.5 (ref 5.0–8.0)

## 2018-08-21 LAB — HEMOGLOBIN A1C
Hgb A1c MFr Bld: 5.6 % of total Hgb (ref <5.7)
Mean Plasma Glucose: 114 (calc)
eAG (mmol/L): 6.3 (calc)

## 2018-08-21 LAB — LIPID PANEL
Cholesterol: 147 mg/dL (ref <200)
HDL: 47 mg/dL — ABNORMAL LOW (ref >=50)
LDL Cholesterol (Calc): 75 mg/dL (calc)
Non-HDL Cholesterol (Calc): 100 mg/dL (calc) (ref <130)
Total CHOL/HDL Ratio: 3.1 (calc) (ref <5.0)
Triglycerides: 158 mg/dL — ABNORMAL HIGH (ref <150)

## 2018-08-21 LAB — TSH: TSH: 1.78 mIU/L (ref 0.40–4.50)

## 2018-08-21 LAB — INSULIN, RANDOM: Insulin: 4.1 u[IU]/mL

## 2018-08-21 LAB — VITAMIN D 25 HYDROXY (VIT D DEFICIENCY, FRACTURES): Vit D, 25-Hydroxy: 88 ng/mL (ref 30–100)

## 2018-08-21 LAB — MAGNESIUM: Magnesium: 2 mg/dL (ref 1.5–2.5)

## 2018-09-06 ENCOUNTER — Other Ambulatory Visit: Payer: Self-pay | Admitting: Internal Medicine

## 2018-09-09 ENCOUNTER — Other Ambulatory Visit: Payer: Self-pay | Admitting: Adult Health

## 2018-09-09 DIAGNOSIS — G894 Chronic pain syndrome: Secondary | ICD-10-CM

## 2018-09-13 ENCOUNTER — Other Ambulatory Visit: Payer: Self-pay | Admitting: Internal Medicine

## 2018-09-13 DIAGNOSIS — F411 Generalized anxiety disorder: Secondary | ICD-10-CM

## 2018-10-25 LAB — HM DIABETES EYE EXAM

## 2018-10-30 ENCOUNTER — Encounter: Payer: Self-pay | Admitting: *Deleted

## 2018-11-15 ENCOUNTER — Other Ambulatory Visit: Payer: Self-pay | Admitting: Internal Medicine

## 2018-11-15 DIAGNOSIS — F411 Generalized anxiety disorder: Secondary | ICD-10-CM

## 2018-11-23 NOTE — Progress Notes (Signed)
MEDICARE ANNUAL WELLNESS VISIT AND FOLLOW UP  Assessment:   Encounter for Medicare annual wellness exam She will follow up ophthalmology, Dr. Oletta Lamas, and schedule mammogram  Essential hypertension - continue medications, DASH diet, exercise and monitor at home. Call if greater than 130/80.  -     CBC with Differential/Platelet -     CMP/GFR -     TSH  Controlled type 2 diabetes mellitus with diabetic nephropathy, without long-term current use of insulin (HCC) -     Hemoglobin A1c Discussed general issues about diabetes pathophysiology and management., Educational material distributed., Suggested low cholesterol diet., Encouraged aerobic exercise., Discussed foot care., Reminded to get yearly retinal exam. Excellent recent controll; taper metformin as indicated by A1Cs  Type 2 diabetes mellitus with stage 2 chronic kidney disease, without long-term current use of insulin (HCC) -     Hemoglobin A1c Discussed general issues about diabetes pathophysiology and management., Educational material distributed., Suggested low cholesterol diet., Encouraged aerobic exercise., Discussed foot care., Reminded to get yearly retinal exam.  Degenerative disc disease, lumbar Followed by Dr. Estanislado Pandy  Primary osteoarthritis of both knees Followed by Dr. Estanislado Pandy  Mixed hyperlipidemia -     Lipid panel - -continue medications, check lipids, decrease fatty foods, increase activity.   Non-Hodgkin's lymphoma, unspecified body region, unspecified non-Hodgkin lymphoma type ([place]) Monitor symptoms, in remission, released by Dr. Jana Hakim  Depression, major, recurrent, in partial remission ([place]) Continue prozac, follow up with psych, benzo use is appropriate Lifestyle discussed: diet/exerise, sleep hygiene, stress management, hydration  Fibromyalgia syndrome Continue medications, follow up with Dr. Estanislado Pandy  Medication management -     Magnesium  Vitamin D deficiency Continue  supplement  Gastroesophageal reflux disease, esophagitis presence not specified Continue PPI/H2 blocker, diet discussed  Irritable bowel syndrome, unspecified type If not on benefiber then add it, decrease stress,  if any worsening symptoms, blood in stool, AB pain, etc call office  BMi 24 Continue to recommend diet heavy in fruits and veggies and low in animal meats, cheeses, and dairy products, appropriate calorie intake Discuss exercise recommendations routinely Continue to monitor weight at each visit   Osteopenia Repeat DEXA 2021, continue Vit D and Ca, weight bearing exercises  OAB Wears pull ups, improved with oxybutynin  Over 30 minutes of exam, counseling, chart review, and critical decision making was performed  Future Appointments  Date Time Provider [place]  03/04/2019  2:30 PM Unk [name], MD [facility] None  09/10/2019  2:00 PM Unk [name], MD [facility] None    Plan:   During the course of the visit the patient was educated and counseled about appropriate screening and preventive services including:    Pneumococcal vaccine   Influenza vaccine  Td vaccine  Prevnar 13  Screening electrocardiogram  Screening mammography  Bone densitometry screening  Colorectal cancer screening  Diabetes screening  Glaucoma screening  Nutrition counseling   Advanced directives: given info/requested copies   Subjective:   Debra Short is a 78 y.o. female who presents for Medicare Annual Wellness Visit and 3 month follow up on hypertension, prediabetes, hyperlipidemia, vitamin D def.   She is thinking about applying to [employer] as she feels she is ready for extra assistance but has been deferring due to covid 19.   Non-hodgkin's was treated by Dr. Jana Hakim, in remission and monitored at this office.   Patient has a long standing history of fibromyalgia syndrome followed by Dr Estanislado Pandy and previously prescribed  hydrocodone but this was  d/c'd related to concerns with falling, and she has been doing outpatient therapy. She is on prozac 40 mg daily, PRN xanax for depression/anxiety and insomnia, was seeing psych but hasn't followed up recently, she takes 1/4-1/2 tab PRN and estimates 1 month supply lasts 4 months. She also takes gabapentin 600 mg at night, 300 mg during the day.   She is on oxybutynin for OAB and perceives significant benefit with this.   BMI is Body mass index is 24.89 kg/m., she has been working on diet, exercise is limited. She has been doing meal replacement green shake (1 meal per day) and is down from 172 lb to 148 lb today. Plans to start walking on treadmill at home.  Wt Readings from Last 3 Encounters:  11/26/18 148 lb 6.4 oz (67.3 kg)  08/20/18 156 lb 3.2 oz (70.9 kg)  05/14/18 166 lb (75.3 kg)   Her blood pressure has been controlled at home, today their BP is BP: 106/60 She does not workout. She denies chest pain, shortness of breath, dizziness. She will occasionally get some walking in around the house or do stretches but is not formally exercising.    She is on cholesterol medication and denies myalgias. Her cholesterol is at goal. The cholesterol last visit was:   Lab Results  Component Value Date   CHOL 147 08/20/2018   HDL 47 (L) 08/20/2018   LDLCALC 75 08/20/2018   TRIG 158 (H) 08/20/2018   CHOLHDL 3.1 08/20/2018   She has been working on diet/exercise for T2DM well controlled on metformin, most recently back into normal range. She has been taking metformin takes 2-3 tabs daily depending on diet. She denies hypoglycemia, extremity paraesthesias, unintentional weight loss, polydipsia, polyuria, blurry vision. She admits she does not check fasting glucose.  Lab Results  Component Value Date   HGBA1C 5.6 08/20/2018   Last GFR Lab Results  Component Value Date   GFRNONAA 71 08/20/2018   Patient is on Vitamin D supplement. Lab Results  Component Value Date    VD25OH 88 08/20/2018        Medication Review Current Outpatient Medications on File Prior to Visit  Medication Sig Dispense Refill  . ALPRAZolam (XANAX) 1 MG tablet TAKE 1/2-1 TABLET 2-3 TIMES DAILY ONLY IF NEEDED FOR ANXIETY ATTACK-LIMIT 5DAYS/WEEK AVOID ADDICTION 90 tablet 0  . aspirin EC 81 MG tablet Take 81 mg by mouth daily.    Marland Kitchen atenolol (TENORMIN) 50 MG tablet Take 1 tablet (50 mg total) by mouth daily. 90 tablet 1  . atorvastatin (LIPITOR) 80 MG tablet TAKE 1/3 TABLET BY MOUTH DAILY FOR CHOLESTEROL. DISCONTINUE PRAVASTATIN 90 tablet 1  . bisoprolol (ZEBETA) 5 MG tablet TAKE 1 TABLET EVERY MORNING FOR BLOOD PRESSURE 90 tablet 1  . Esomeprazole Magnesium (NEXIUM PO) Take 22.3 mg by mouth See admin instructions. Take 1 capsule (22.3 mg OTC) by mouth every morning before breakfast, may also take 1 capsule in the evening as needed for acid reflux    . FLUoxetine (PROZAC) 40 MG capsule TAKE 1 CAPSULE BY MOUTH EVERY DAY 90 capsule 1  . fluticasone (FLONASE) 50 MCG/ACT nasal spray Place 2 sprays into both nostrils daily. 48 g 1  . gabapentin (NEURONTIN) 300 MG capsule TAKE 2 CAPSULES TWICE A DAY 360 capsule 1  . gabapentin (NEURONTIN) 600 MG tablet Take 1/2 to 1 tablet 2 to 3 x /day as needed for Fibromyalgia 270 tablet 1  . metFORMIN (GLUCOPHAGE-XR) 500 MG 24 hr tablet Take 1 to  4 tablets daily as directed for Diabetes 360 tablet 1  . montelukast (SINGULAIR) 10 MG tablet TAKE 1 TABLET EVERY DAY AS NEEDED FOR ALLERGIES 90 tablet 1  . Multiple Vitamin (MULTIVITAMIN WITH MINERALS) TABS tablet Take 1 tablet by mouth daily.    . Multiple Vitamins-Minerals (OCUVITE PO) Take by mouth 2 (two) times a day.    . oxybutynin (DITROPAN) 5 MG tablet TAKE 1/2 TO 1 TABLET 2 TIMES DAILY FOR BLADDER 180 tablet 12  . triamcinolone (NASACORT) 55 MCG/ACT AERO nasal inhaler Place 2 sprays into both nostrils daily as needed (for seasonal allergies).      No current facility-administered medications on file prior  to visit.     Allergies: Allergies  Allergen Reactions  . Effexor [Venlafaxine] Other (See Comments)    Not effective  . Nortriptyline Other (See Comments)    Not effective  . Paxil [Paroxetine Hcl] Other (See Comments)    Not effective  . Robaxin [Methocarbamol] Itching  . Tizanidine Itching  . Zoloft [Sertraline Hcl] Other (See Comments)    Not effective    Current Problems (verified) has Hyperlipidemia associated with type 2 diabetes mellitus ([place]); Hypertension; GERD (gastroesophageal reflux disease); Vitamin D deficiency; IBS (irritable bowel syndrome); Diabetes mellitus type 2, controlled ([place]); Non Hodgkin's lymphoma ([place]); Medication management; Insomnia; CKD stage 2 due to type 2 diabetes mellitus ([place]); Degenerative disc disease, lumbar; Primary osteoarthritis of both knees; Fibromyalgia syndrome; Depression, major, recurrent, in partial remission ([place]); Overweight (BMI 25.0-29.9); and OAB (overactive bladder) on their problem list.  Screening Tests Immunization History  Administered Date(s) Administered  . Influenza Split 04/10/2013, 03/23/2016  . Influenza, High Dose Seasonal PF 03/24/2014, 02/27/2015  . Influenza-Unspecified 03/27/2018  . Pneumococcal Conjugate-13 03/24/2014  . Pneumococcal Polysaccharide-23 06/13/2008  . Td 09/24/2012  . Tdap 09/26/2014  . Zoster 02/23/2011    Preventative care: Last colonoscopy: 2013, diverticulosis, no polyps, Dr. Oletta Lamas, was recommended 5 year follow up that never happened due to pneumonia, she will call to inquire Last mammogram:  10/2017 - patient will schedule DEXA 10/2017 - T -1.4 forearm   Prior vaccinations: TD or Tdap: 2016   Influenza: 03/2018  Pneumococcal: 2010 Prevnar13: 2015 Shingles/Zostavax: 2012  Names of Other Physician/Practitioners you currently use: 1. [facility]- here for primary care 2. [facility], eye doctor, 10/25/2018 - report abstracted, has  follow up tomorrow due to decreased vision and failing drivers test  3. Dr. Johnn Hai, dentist, last visit q55months 2019  Patient Care Team: Unk [name], MD as PCP - General (Internal Medicine) Bo Merino, MD as Consulting Physician (Rheumatology) [name], MD as Consulting Physician (Gastroenterology) Allyn Kenner, MD (Dermatology)  Surgical: She  has a past surgical history that includes ORIF ankle fracture (Left, 09/26/2014); Abdominal hysterectomy; Tonsillectomy; and ORIF ankle fracture (Left, 09/26/2014). Family Her family history includes Cancer in her brother, father, maternal grandfather, and mother. Social history  She reports that she has never smoked. She has never used smokeless tobacco. She reports that she does not drink alcohol or use drugs.  MEDICARE WELLNESS OBJECTIVES: Physical activity: Current Exercise Habits: The patient does not participate in regular exercise at present, Exercise limited by: None identified Cardiac risk factors: Cardiac Risk Factors include: advanced age (>71men, >50 women);dyslipidemia;diabetes mellitus;hypertension;sedentary lifestyle Depression/mood screen:   Depression screen [facility] 2/9 11/26/2018  Decreased Interest 0  Down, Depressed, Hopeless 0  PHQ - 2 Score 0  Altered sleeping -  Tired, decreased energy -  Change in appetite -  Feeling bad or failure about yourself  -  Trouble concentrating -  Moving slowly or fidgety/restless -  Suicidal thoughts -  PHQ-9 Score -  Difficult doing work/chores -    ADLs:  In your present state of health, do you have any difficulty performing the following activities: 11/26/2018 08/20/2018  Hearing? N N  Vision? Y N  Comment following up tomorrow with ophth -  Difficulty concentrating or making decisions? N N  Walking or climbing stairs? N N  Dressing or bathing? N N  Doing errands, shopping? Y N  Comment due to vision, ophth follow up scheduled -  Some recent data might be hidden      Cognitive Testing  Alert? Yes  Normal Appearance?Yes  Oriented to person? Yes  Place? Yes   Time? Yes  Recall of three objects?  Yes  Can perform simple calculations? Yes  Displays appropriate judgment?Yes  Can read the correct time from a watch face?Yes  EOL planning: Does Patient Have a Medical Advance Directive?: Yes Type of Advance Directive: Healthcare Power of Attorney, Living will Does patient want to make changes to medical advance directive?: No - Patient declined Copy of [place] in Chart?: Yes - validated most recent copy scanned in chart (See row information)   Objective:   Today's Vitals   11/26/18 1351  BP: 106/60  Pulse: 83  Temp: (!) 97.5 F (36.4 C)  SpO2: 98%  Weight: 148 lb 6.4 oz (67.3 kg)   Body mass index is 24.89 kg/m. Wt Readings from Last 3 Encounters:  11/26/18 148 lb 6.4 oz (67.3 kg)  08/20/18 156 lb 3.2 oz (70.9 kg)  05/14/18 166 lb (75.3 kg)    General appearance: alert, no distress, WD/WN,  female HEENT: normocephalic, sclerae anicteric, TMs pearly, nares patent, no discharge or erythema, pharynx normal Oral cavity: MMM, no lesions Neck: supple, no lymphadenopathy, no thyromegaly, + nodules on thyroid left side more than right, no masses Heart: RRR, normal S1, S2, no murmurs Lungs: CTA bilaterally, no wheezes, rhonchi, or rales Abdomen: +bs, soft, non tender, non distended, no masses, no hepatomegaly, no splenomegaly Musculoskeletal: nontender, no swelling, no obvious deformity Extremities: no edema, no cyanosis, no clubbing Pulses: 2+ symmetric, upper and lower extremities, normal cap refill Neurological: alert, oriented x 3, CN2-12 intact, strength normal upper extremities and lower extremities, sensation normal throughout, DTRs 2+ throughout, no cerebellar signs, gait slow Psychiatric: normal affect, behavior normal, pleasant  Breast: defer Gyn: defer Rectal: defer   Medicare Attestation I have personally  reviewed: The patient's medical and social history Their use of alcohol, tobacco or illicit drugs Their current medications and supplements The patient's functional ability including ADLs,fall risks, home safety risks, cognitive, and hearing and visual impairment Diet and physical activities Evidence for depression or mood disorders  The patient's weight, height, BMI, and visual acuity have been recorded in the chart.  I have made referrals, counseling, and provided education to the patient based on review of the above and I have provided the patient with a written personalized care plan for preventive services.     Izora Ribas, NP   11/26/2018

## 2018-11-26 ENCOUNTER — Other Ambulatory Visit: Payer: Self-pay

## 2018-11-26 ENCOUNTER — Ambulatory Visit (INDEPENDENT_AMBULATORY_CARE_PROVIDER_SITE_OTHER): Payer: PPO | Admitting: Adult Health

## 2018-11-26 ENCOUNTER — Encounter: Payer: Self-pay | Admitting: Adult Health

## 2018-11-26 VITALS — BP 106/60 | HR 83 | Temp 97.5°F | Wt 148.4 lb

## 2018-11-26 DIAGNOSIS — G4700 Insomnia, unspecified: Secondary | ICD-10-CM

## 2018-11-26 DIAGNOSIS — C8590 Non-Hodgkin lymphoma, unspecified, unspecified site: Secondary | ICD-10-CM

## 2018-11-26 DIAGNOSIS — E1169 Type 2 diabetes mellitus with other specified complication: Secondary | ICD-10-CM | POA: Diagnosis not present

## 2018-11-26 DIAGNOSIS — M170 Bilateral primary osteoarthritis of knee: Secondary | ICD-10-CM | POA: Diagnosis not present

## 2018-11-26 DIAGNOSIS — I10 Essential (primary) hypertension: Secondary | ICD-10-CM | POA: Diagnosis not present

## 2018-11-26 DIAGNOSIS — Z0000 Encounter for general adult medical examination without abnormal findings: Secondary | ICD-10-CM

## 2018-11-26 DIAGNOSIS — K589 Irritable bowel syndrome without diarrhea: Secondary | ICD-10-CM

## 2018-11-26 DIAGNOSIS — E1122 Type 2 diabetes mellitus with diabetic chronic kidney disease: Secondary | ICD-10-CM

## 2018-11-26 DIAGNOSIS — K219 Gastro-esophageal reflux disease without esophagitis: Secondary | ICD-10-CM | POA: Diagnosis not present

## 2018-11-26 DIAGNOSIS — Z6824 Body mass index (BMI) 24.0-24.9, adult: Secondary | ICD-10-CM | POA: Diagnosis not present

## 2018-11-26 DIAGNOSIS — N182 Chronic kidney disease, stage 2 (mild): Secondary | ICD-10-CM

## 2018-11-26 DIAGNOSIS — F3341 Major depressive disorder, recurrent, in partial remission: Secondary | ICD-10-CM | POA: Diagnosis not present

## 2018-11-26 DIAGNOSIS — M8580 Other specified disorders of bone density and structure, unspecified site: Secondary | ICD-10-CM | POA: Insufficient documentation

## 2018-11-26 DIAGNOSIS — E559 Vitamin D deficiency, unspecified: Secondary | ICD-10-CM | POA: Diagnosis not present

## 2018-11-26 DIAGNOSIS — Z79899 Other long term (current) drug therapy: Secondary | ICD-10-CM

## 2018-11-26 DIAGNOSIS — Z0001 Encounter for general adult medical examination with abnormal findings: Secondary | ICD-10-CM | POA: Diagnosis not present

## 2018-11-26 DIAGNOSIS — E1121 Type 2 diabetes mellitus with diabetic nephropathy: Secondary | ICD-10-CM | POA: Diagnosis not present

## 2018-11-26 DIAGNOSIS — M5136 Other intervertebral disc degeneration, lumbar region: Secondary | ICD-10-CM | POA: Diagnosis not present

## 2018-11-26 DIAGNOSIS — N3281 Overactive bladder: Secondary | ICD-10-CM | POA: Diagnosis not present

## 2018-11-26 DIAGNOSIS — M797 Fibromyalgia: Secondary | ICD-10-CM

## 2018-11-26 DIAGNOSIS — E785 Hyperlipidemia, unspecified: Secondary | ICD-10-CM

## 2018-11-26 DIAGNOSIS — R6889 Other general symptoms and signs: Secondary | ICD-10-CM | POA: Diagnosis not present

## 2018-11-26 DIAGNOSIS — M85831 Other specified disorders of bone density and structure, right forearm: Secondary | ICD-10-CM

## 2018-11-26 NOTE — Patient Instructions (Addendum)
  Ms. Espejo , Thank you for taking time to come for your Medicare Wellness Visit. I appreciate your ongoing commitment to your health goals. Please review the following plan we discussed and let me know if I can assist you in the future.   These are the goals we discussed: Goals    . Exercise 5x per week (15-20 min per time)       This is a list of the screening recommended for you and due dates:  Health Maintenance  Topic Date Due  . Flu Shot  01/12/2019  . Hemoglobin A1C  02/20/2019  . Complete foot exam   08/20/2019  . Urine Protein Check  08/20/2019  . Eye exam for diabetics  10/25/2019  . Tetanus Vaccine  09/25/2024  . DEXA scan (bone density measurement)  Completed  . Pneumonia vaccines  Completed    Bring advanced directives paperwork   Call Dr. Oletta Lamas office - ask about colonoscopy that was due in 2018, or due to age could we do cologuard instead   HOW TO [place]  7 a.m.-6:30 p.m., Monday 7 a.m.-5 p.m., Tuesday-Friday Schedule an appointment by calling [phone].  [facility] Schedule an appointment by calling [phone].

## 2018-11-27 LAB — COMPLETE METABOLIC PANEL WITH GFR
AG Ratio: 2 (calc) (ref 1.0–2.5)
ALT: 13 U/L (ref 6–29)
AST: 17 U/L (ref 10–35)
Albumin: 4.3 g/dL (ref 3.6–5.1)
Alkaline phosphatase (APISO): 50 U/L (ref 37–153)
BUN: 20 mg/dL (ref 7–25)
CO2: 30 mmol/L (ref 20–32)
Calcium: 9.8 mg/dL (ref 8.6–10.4)
Chloride: 104 mmol/L (ref 98–110)
Creat: 0.85 mg/dL (ref 0.60–0.88)
GFR, Est African American: 75 mL/min/{1.73_m2} (ref >=60)
GFR, Est Non African American: 65 mL/min/{1.73_m2} (ref >=60)
Globulin: 2.2 g/dL (calc) (ref 1.9–3.7)
Glucose, Bld: 96 mg/dL (ref 65–99)
Potassium: 4.7 mmol/L (ref 3.5–5.3)
Sodium: 141 mmol/L (ref 135–146)
Total Bilirubin: 1 mg/dL (ref 0.2–1.2)
Total Protein: 6.5 g/dL (ref 6.1–8.1)

## 2018-11-27 LAB — CBC WITH DIFFERENTIAL/PLATELET
Absolute Monocytes: 499 cells/uL (ref 200–950)
Basophils Absolute: 29 cells/uL (ref 0–200)
Basophils Relative: 0.5 %
Eosinophils Absolute: 493 cells/uL (ref 15–500)
Eosinophils Relative: 8.5 %
HCT: 39 % (ref 35.0–45.0)
Hemoglobin: 12.8 g/dL (ref 11.7–15.5)
Lymphs Abs: 1183 cells/uL (ref 850–3900)
MCH: 31.1 pg (ref 27.0–33.0)
MCHC: 32.8 g/dL (ref 32.0–36.0)
MCV: 94.9 fL (ref 80.0–100.0)
MPV: 9.9 fL (ref 7.5–12.5)
Monocytes Relative: 8.6 %
Neutro Abs: 3596 cells/uL (ref 1500–7800)
Neutrophils Relative %: 62 %
Platelets: 304 10*3/uL (ref 140–400)
RBC: 4.11 10*6/uL (ref 3.80–5.10)
RDW: 12.7 % (ref 11.0–15.0)
Total Lymphocyte: 20.4 %
WBC: 5.8 10*3/uL (ref 3.8–10.8)

## 2018-11-27 LAB — LIPID PANEL
Cholesterol: 111 mg/dL (ref <200)
HDL: 40 mg/dL — ABNORMAL LOW (ref >=50)
LDL Cholesterol (Calc): 51 mg/dL (calc)
Non-HDL Cholesterol (Calc): 71 mg/dL (calc) (ref <130)
Total CHOL/HDL Ratio: 2.8 (calc) (ref <5.0)
Triglycerides: 112 mg/dL (ref <150)

## 2018-11-27 LAB — MAGNESIUM: Magnesium: 1.7 mg/dL (ref 1.5–2.5)

## 2018-11-27 LAB — HEMOGLOBIN A1C
Hgb A1c MFr Bld: 5.5 % of total Hgb (ref <5.7)
Mean Plasma Glucose: 111 (calc)
eAG (mmol/L): 6.2 (calc)

## 2018-11-27 LAB — TSH: TSH: 1.11 mIU/L (ref 0.40–4.50)

## 2018-12-09 ENCOUNTER — Other Ambulatory Visit: Payer: Self-pay | Admitting: Internal Medicine

## 2018-12-29 ENCOUNTER — Other Ambulatory Visit: Payer: Self-pay | Admitting: Internal Medicine

## 2018-12-31 DIAGNOSIS — H353131 Nonexudative age-related macular degeneration, bilateral, early dry stage: Secondary | ICD-10-CM | POA: Diagnosis not present

## 2019-02-14 ENCOUNTER — Other Ambulatory Visit: Payer: Self-pay | Admitting: Adult Health

## 2019-02-14 DIAGNOSIS — F411 Generalized anxiety disorder: Secondary | ICD-10-CM

## 2019-03-03 ENCOUNTER — Encounter: Payer: Self-pay | Admitting: Internal Medicine

## 2019-03-03 MED ORDER — ATORVASTATIN CALCIUM 80 MG PO TABS: ORAL_TABLET | ORAL | 1 refills | Status: DC

## 2019-03-03 NOTE — Progress Notes (Signed)
History of Present Illness:      This very nice 78 y.o. WWF presents for 6 month follow up with HTN, HLD, T2_NIDDM, and Vitamin D Deficiency.  Patient is followed by Dr Estanislado Pandy for Fibromyalgia.       Patient is treated for HTN (2012)  & BP has been controlled at home. Today's BP is at goal - 136/74. Patient has had no complaints of any cardiac type chest pain, palpitations, dyspnea / orthopnea / PND, dizziness, claudication, or dependent edema.      Hyperlipidemia is controlled with diet & meds. Patient denies myalgias or other med SE's. Last Lipids were at goal:  Lab Results  Component Value Date   CHOL 111 11/26/2018   HDL 40 (L) 11/26/2018   LDLCALC 51 11/26/2018   TRIG 112 11/26/2018   CHOLHDL 2.8 11/26/2018        Also, the patient is on Metformin for T2_NIDDM (2016) and has had no symptoms of reactive hypoglycemia, diabetic polys, paresthesias or visual blurring. She does not monitor CBG's.  Last A1c was Normal & at goal:  Lab Results  Component Value Date   HGBA1C 5.5 11/26/2018       Further, the patient also has history of Vitamin D Deficiency ("25" / 2009) and supplements vitamin D without any suspected side-effects. Last vitamin D was at goal: Lab Results  Component Value Date   VD25OH 88 08/20/2018    Current Outpatient Medications on File Prior to Visit  Medication Sig  . ALPRAZolam (XANAX) 1 MG tablet Take 1/2-1 tablet 2 - 3 x /day ONLY if needed for Anxiety Attack &  limit to 5 days /week to avoid addiction  . aspirin EC 81 MG tablet Take 81 mg by mouth daily.  Marland Kitchen atenolol (TENORMIN) 50 MG tablet Take 1 tablet (50 mg total) by mouth daily.  . Esomeprazole Magnesium (NEXIUM PO) Take 22.3 mg by mouth See admin instructions. Take 1 capsule (22.3 mg OTC) by mouth every morning before breakfast, may also take 1 capsule in the evening as needed for acid reflux  . FLUoxetine (PROZAC) 40 MG capsule Take 1 capsule Daily for Mood  . fluticasone (FLONASE) 50  MCG/ACT nasal spray Place 2 sprays into both nostrils daily.  Marland Kitchen gabapentin (NEURONTIN) 300 MG capsule TAKE 2 CAPSULES TWICE A DAY  . gabapentin (NEURONTIN) 600 MG tablet Take 1/2 to 1 tablet 2 to 3 x /day as needed for Fibromyalgia  . metFORMIN (GLUCOPHAGE-XR) 500 MG 24 hr tablet Take 1 to 4 tablets daily as directed for Diabetes  . montelukast (SINGULAIR) 10 MG tablet Take 1 tablet Daily for Allergies  . Multiple Vitamin (MULTIVITAMIN WITH MINERALS) TABS tablet Take 1 tablet by mouth daily.  . Multiple Vitamins-Minerals (OCUVITE PO) Take by mouth 2 (two) times a day.  . oxybutynin (DITROPAN) 5 MG tablet TAKE 1/2 TO 1 TABLET 2 TIMES DAILY FOR BLADDER  . triamcinolone (NASACORT) 55 MCG/ACT AERO nasal inhaler Place 2 sprays into both nostrils daily as needed (for seasonal allergies).    No current facility-administered medications on file prior to visit.     Allergies  Allergen Reactions  . Effexor [Venlafaxine] Other (See Comments)    Not effective  . Nortriptyline Other (See Comments)    Not effective  . Paxil [Paroxetine Hcl] Other (See Comments)    Not effective  . Robaxin [Methocarbamol] Itching  . Tizanidine Itching  . Zoloft [Sertraline Hcl] Other (See Comments)    Not  effective    PMHx:   Past Medical History:  Diagnosis Date  . Allergy   . Depression with anxiety 10/06/2014  . Diabetes mellitus without complication ([place])   . Fibromyalgia   . GERD (gastroesophageal reflux disease)   . History of migraine 08/25/2016  . Hyperlipidemia   . Hypertension   . IBS (irritable bowel syndrome)   . 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 Vitamin D deficiency    Immunization History  Administered Date(s) Administered  . Influenza Split 04/10/2013, 03/23/2016  . Influenza, High Dose Seasonal PF 03/24/2014, 02/27/2015  . Influenza-Unspecified 03/27/2018  . Pneumococcal Conjugate-13 03/24/2014  . Pneumococcal Polysaccharide-23 06/13/2008  . Td 09/24/2012  . Tdap 09/26/2014  . Zoster  02/23/2011   Past Surgical History:  Procedure Laterality Date  . ABDOMINAL HYSTERECTOMY    . ORIF ANKLE FRACTURE Left 09/26/2014  . ORIF ANKLE FRACTURE Left 09/26/2014   Procedure: OPEN REDUCTION INTERNAL FIXATION (ORIF) ANKLE FRACTURE;  Surgeon: [name], MD;  Location: [facility];  Service: Orthopedics;  Laterality: Left;  . TONSILLECTOMY      FHx:    Reviewed / unchanged  SHx:    Reviewed / unchanged   Systems Review:  Constitutional: Denies fever, chills, wt changes, headaches, insomnia, fatigue, night sweats, change in appetite. Eyes: Denies redness, blurred vision, diplopia, discharge, itchy, watery eyes.  ENT: Denies discharge, congestion, post nasal drip, epistaxis, sore throat, earache, hearing loss, dental pain, tinnitus, vertigo, sinus pain, snoring.  CV: Denies chest pain, palpitations, irregular heartbeat, syncope, dyspnea, diaphoresis, orthopnea, PND, claudication or edema. Respiratory: denies cough, dyspnea, DOE, pleurisy, hoarseness, laryngitis, wheezing.  Gastrointestinal: Denies dysphagia, odynophagia, heartburn, reflux, water brash, abdominal pain or cramps, nausea, vomiting, bloating, diarrhea, constipation, hematemesis, melena, hematochezia  or hemorrhoids. Genitourinary: Denies dysuria, frequency, urgency, nocturia, hesitancy, discharge, hematuria or flank pain. Musculoskeletal: Denies arthralgias, myalgias, stiffness, jt. swelling, pain, limping or strain/sprain.  Skin: Denies pruritus, rash, hives, warts, acne, eczema or change in skin lesion(s). Neuro: No weakness, tremor, incoordination, spasms, paresthesia or pain. Psychiatric: Denies confusion, memory loss or sensory loss. Endo: Denies change in weight, skin or hair change.  Heme/Lymph: No excessive bleeding, bruising or enlarged lymph nodes.  Physical Exam  BP 136/74   Pulse 92   Temp (!) 97.5 F (36.4 C)   Wt 148 lb 9.6 oz (67.4 kg)   SpO2 97%   BMI 24.92 kg/m   Appears  well nourished, well  groomed  and in no distress.  Eyes: PERRLA, EOMs, conjunctiva no swelling or erythema. Sinuses: No frontal/maxillary tenderness ENT/Mouth: EAC's clear, TM's nl w/o erythema, bulging. Nares clear w/o erythema, swelling, exudates. Oropharynx clear without erythema or exudates. Oral hygiene is good. Tongue normal, non obstructing. Hearing intact.  Neck: Supple. Thyroid not palpable. Car 2+/2+ without bruits, nodes or JVD. Chest: Respirations nl with BS clear & equal w/o rales, rhonchi, wheezing or stridor.  Cor: Heart sounds normal w/ regular rate and rhythm without sig. murmurs, gallops, clicks or rubs. Peripheral pulses normal and equal  without edema.  Abdomen: Soft & bowel sounds normal. Non-tender w/o guarding, rebound, hernias, masses or organomegaly.  Lymphatics: Unremarkable.  Musculoskeletal: Full ROM all peripheral extremities, joint stability, 5/5 strength and normal gait.  Skin: Warm, dry without exposed rashes, lesions or ecchymosis apparent.  Neuro: Cranial nerves intact, reflexes equal bilaterally. Sensory-motor testing grossly intact. Tendon reflexes grossly intact.  Pysch: Alert & oriented x 3.  Insight and judgement nl & appropriate. No ideations.  Assessment and Plan:  1. Essential hypertension  - Continue medication, monitor blood pressure at home.  - Continue DASH diet.  Reminder to go to the ER if any CP,  SOB, nausea, dizziness, severe HA, changes vision/speech.  - CBC with Differential/Platelet - COMPLETE METABOLIC PANEL WITH GFR - Magnesium - TSH  2. Hyperlipidemia associated with type 2 diabetes mellitus ([place])  - Continue diet/meds, exercise,& lifestyle modifications.  - Continue monitor periodic cholesterol/liver & renal functions   - Lipid panel - TSH  3. Type 2 diabetes mellitus with stage 2 chronic kidney disease, without long-term current use of insulin (HCC)  - Continue diet, exercise  - Lifestyle modifications.  - Monitor appropriate labs.  -  Insulin, random - Hemoglobin A1c  4. Vitamin D deficiency  - Continue supplementation.  - VITAMIN D 25 Hydroxyl  5. Fibromyalgia syndrome  6. Medication management  - CBC with Differential/Platelet - COMPLETE METABOLIC PANEL WITH GFR - Magnesium - Lipid panel - TSH - Insulin, random - VITAMIN D 25 Hydroxyl - Hemoglobin A1c        Discussed  regular exercise, BP monitoring, weight control to achieve/maintain BMI less than 25 and discussed med and SE's. Recommended labs to assess and monitor clinical status with further disposition pending results of labs.  I discussed the assessment and treatment plan with the patient. The patient was provided an opportunity to ask questions and all were answered. The patient agreed with the plan and demonstrated an understanding of the instructions.  I provided over 30 minutes of exam, counseling, chart review and  complex critical decision making.  Kirtland Bouchard, MD

## 2019-03-03 NOTE — Patient Instructions (Signed)

## 2019-03-04 ENCOUNTER — Encounter: Payer: Self-pay | Admitting: Internal Medicine

## 2019-03-04 ENCOUNTER — Ambulatory Visit (INDEPENDENT_AMBULATORY_CARE_PROVIDER_SITE_OTHER): Payer: PPO | Admitting: Internal Medicine

## 2019-03-04 ENCOUNTER — Other Ambulatory Visit: Payer: Self-pay

## 2019-03-04 VITALS — BP 136/74 | HR 92 | Temp 97.5°F | Ht 64.75 in | Wt 148.6 lb

## 2019-03-04 DIAGNOSIS — I10 Essential (primary) hypertension: Secondary | ICD-10-CM | POA: Diagnosis not present

## 2019-03-04 DIAGNOSIS — E1169 Type 2 diabetes mellitus with other specified complication: Secondary | ICD-10-CM

## 2019-03-04 DIAGNOSIS — M797 Fibromyalgia: Secondary | ICD-10-CM | POA: Diagnosis not present

## 2019-03-04 DIAGNOSIS — N182 Chronic kidney disease, stage 2 (mild): Secondary | ICD-10-CM

## 2019-03-04 DIAGNOSIS — E785 Hyperlipidemia, unspecified: Secondary | ICD-10-CM | POA: Diagnosis not present

## 2019-03-04 DIAGNOSIS — E559 Vitamin D deficiency, unspecified: Secondary | ICD-10-CM

## 2019-03-04 DIAGNOSIS — E1122 Type 2 diabetes mellitus with diabetic chronic kidney disease: Secondary | ICD-10-CM

## 2019-03-04 DIAGNOSIS — Z79899 Other long term (current) drug therapy: Secondary | ICD-10-CM

## 2019-03-04 MED ORDER — BISOPROLOL FUMARATE 5 MG PO TABS: ORAL_TABLET | ORAL | 1 refills | Status: DC

## 2019-03-05 LAB — CBC WITH DIFFERENTIAL/PLATELET
Absolute Monocytes: 529 cells/uL (ref 200–950)
Basophils Absolute: 32 cells/uL (ref 0–200)
Basophils Relative: 0.5 %
Eosinophils Absolute: 151 cells/uL (ref 15–500)
Eosinophils Relative: 2.4 %
HCT: 38.7 % (ref 35.0–45.0)
Hemoglobin: 12.8 g/dL (ref 11.7–15.5)
Lymphs Abs: 1600 cells/uL (ref 850–3900)
MCH: 31.4 pg (ref 27.0–33.0)
MCHC: 33.1 g/dL (ref 32.0–36.0)
MCV: 94.9 fL (ref 80.0–100.0)
MPV: 9.5 fL (ref 7.5–12.5)
Monocytes Relative: 8.4 %
Neutro Abs: 3988 cells/uL (ref 1500–7800)
Neutrophils Relative %: 63.3 %
Platelets: 291 10*3/uL (ref 140–400)
RBC: 4.08 10*6/uL (ref 3.80–5.10)
RDW: 11.9 % (ref 11.0–15.0)
Total Lymphocyte: 25.4 %
WBC: 6.3 10*3/uL (ref 3.8–10.8)

## 2019-03-05 LAB — COMPLETE METABOLIC PANEL WITH GFR
AG Ratio: 1.7 (calc) (ref 1.0–2.5)
ALT: 15 U/L (ref 6–29)
AST: 17 U/L (ref 10–35)
Albumin: 4.3 g/dL (ref 3.6–5.1)
Alkaline phosphatase (APISO): 53 U/L (ref 37–153)
BUN: 17 mg/dL (ref 7–25)
CO2: 31 mmol/L (ref 20–32)
Calcium: 9.5 mg/dL (ref 8.6–10.4)
Chloride: 100 mmol/L (ref 98–110)
Creat: 0.69 mg/dL (ref 0.60–0.88)
GFR, Est African American: 95 mL/min/{1.73_m2} (ref >=60)
GFR, Est Non African American: 82 mL/min/{1.73_m2} (ref >=60)
Globulin: 2.5 g/dL (calc) (ref 1.9–3.7)
Glucose, Bld: 94 mg/dL (ref 65–99)
Potassium: 5 mmol/L (ref 3.5–5.3)
Sodium: 141 mmol/L (ref 135–146)
Total Bilirubin: 1 mg/dL (ref 0.2–1.2)
Total Protein: 6.8 g/dL (ref 6.1–8.1)

## 2019-03-05 LAB — HEMOGLOBIN A1C
Hgb A1c MFr Bld: 5.4 % of total Hgb (ref <5.7)
Mean Plasma Glucose: 108 (calc)
eAG (mmol/L): 6 (calc)

## 2019-03-05 LAB — VITAMIN D 25 HYDROXY (VIT D DEFICIENCY, FRACTURES): Vit D, 25-Hydroxy: 58 ng/mL (ref 30–100)

## 2019-03-05 LAB — TSH: TSH: 1.46 mIU/L (ref 0.40–4.50)

## 2019-03-05 LAB — LIPID PANEL
Cholesterol: 146 mg/dL (ref <200)
HDL: 54 mg/dL (ref >=50)
LDL Cholesterol (Calc): 71 mg/dL (calc)
Non-HDL Cholesterol (Calc): 92 mg/dL (calc) (ref <130)
Total CHOL/HDL Ratio: 2.7 (calc) (ref <5.0)
Triglycerides: 125 mg/dL (ref <150)

## 2019-03-05 LAB — INSULIN, RANDOM: Insulin: 11.4 u[IU]/mL

## 2019-03-05 LAB — MAGNESIUM: Magnesium: 2 mg/dL (ref 1.5–2.5)

## 2019-03-28 ENCOUNTER — Ambulatory Visit: Payer: PPO

## 2019-05-07 ENCOUNTER — Other Ambulatory Visit: Payer: Self-pay | Admitting: Internal Medicine

## 2019-05-07 DIAGNOSIS — F411 Generalized anxiety disorder: Secondary | ICD-10-CM

## 2019-05-16 ENCOUNTER — Ambulatory Visit: Payer: Self-pay | Admitting: Adult Health

## 2019-06-03 ENCOUNTER — Other Ambulatory Visit: Payer: Self-pay | Admitting: Internal Medicine

## 2019-06-04 ENCOUNTER — Ambulatory Visit (INDEPENDENT_AMBULATORY_CARE_PROVIDER_SITE_OTHER): Payer: PPO | Admitting: Adult Health Nurse Practitioner

## 2019-06-04 ENCOUNTER — Other Ambulatory Visit: Payer: Self-pay

## 2019-06-04 ENCOUNTER — Encounter: Payer: Self-pay | Admitting: Adult Health Nurse Practitioner

## 2019-06-04 VITALS — BP 146/84 | HR 72 | Temp 97.2°F | Resp 16 | Ht 64.75 in | Wt 149.8 lb

## 2019-06-04 DIAGNOSIS — N3281 Overactive bladder: Secondary | ICD-10-CM | POA: Diagnosis not present

## 2019-06-04 DIAGNOSIS — Z6824 Body mass index (BMI) 24.0-24.9, adult: Secondary | ICD-10-CM

## 2019-06-04 DIAGNOSIS — E559 Vitamin D deficiency, unspecified: Secondary | ICD-10-CM | POA: Diagnosis not present

## 2019-06-04 DIAGNOSIS — I10 Essential (primary) hypertension: Secondary | ICD-10-CM | POA: Diagnosis not present

## 2019-06-04 DIAGNOSIS — E1122 Type 2 diabetes mellitus with diabetic chronic kidney disease: Secondary | ICD-10-CM

## 2019-06-04 DIAGNOSIS — J301 Allergic rhinitis due to pollen: Secondary | ICD-10-CM | POA: Diagnosis not present

## 2019-06-04 DIAGNOSIS — H9201 Otalgia, right ear: Secondary | ICD-10-CM

## 2019-06-04 DIAGNOSIS — K2101 Gastro-esophageal reflux disease with esophagitis, with bleeding: Secondary | ICD-10-CM | POA: Diagnosis not present

## 2019-06-04 DIAGNOSIS — M85831 Other specified disorders of bone density and structure, right forearm: Secondary | ICD-10-CM

## 2019-06-04 DIAGNOSIS — E538 Deficiency of other specified B group vitamins: Secondary | ICD-10-CM

## 2019-06-04 DIAGNOSIS — E1169 Type 2 diabetes mellitus with other specified complication: Secondary | ICD-10-CM

## 2019-06-04 DIAGNOSIS — E785 Hyperlipidemia, unspecified: Secondary | ICD-10-CM | POA: Diagnosis not present

## 2019-06-04 DIAGNOSIS — N182 Chronic kidney disease, stage 2 (mild): Secondary | ICD-10-CM

## 2019-06-04 MED ORDER — MONTELUKAST SODIUM 10 MG PO TABS: ORAL_TABLET | ORAL | 1 refills | Status: DC

## 2019-06-04 NOTE — Patient Instructions (Addendum)
Ear care: Both of your ears are clear of wax at this time Use 2-3 drops of oil for next 3-4 nights.  This will help to sooth your ear canals.  Use cotton ball to keep in ears while sleeping. You can use mineral oil, sweet oil or olive oil for this.  For regular maintenance: Use warm or room temp peroxide in ear once every 1-2 weeks, then apply oil that night x1.   Next morning let warm water from shower run into your ears.   This will help flush out any wax Do not use cotton swabs in your ears Should you have pain, decrease in your ability to hear or dizziness please contact the office for an appointment.   We will call you in 1-3 days with your lab results   Get back to walking outside, even for 5 min.  This helps with weight loss, bone strengthening and our mental health.    Vit D  & Vit C 1,000 mg   are recommended to help protect  against the Covid-19 and other Corona viruses.    Also it's recommended  to take  Zinc 50 mg  to help  protect against the Covid-19   and best place to get  is also on [email or web address]  and don't pay more than 6-8 cents /pill !  ================================ Coronavirus (COVID-19) Are you at risk?  Are you at risk for the Coronavirus (COVID-19)?  To be considered HIGH RISK for Coronavirus (COVID-19), you have to meet the following criteria:  . Traveled to [place], [place], [place], [place] or [place]; or in the [place] to [place], [place], [place]  . or [place]; and have fever, cough, and shortness of breath within the last 2 weeks of travel OR . Been in close contact with a person diagnosed with COVID-19 within the last 2 weeks and have  . fever, cough,and shortness of breath .  . IF YOU DO NOT MEET THESE CRITERIA, YOU ARE CONSIDERED LOW RISK FOR COVID-19.  What to do if you are HIGH RISK for COVID-19?  Marland Kitchen If you are having a medical emergency, call 911. . Seek medical care right away. Before you go to a doctor's office, urgent  care or emergency department, .  call ahead and tell them about your recent travel, contact with someone diagnosed with COVID-19  .  and your symptoms.  . You should receive instructions from your physician's office regarding next steps of care.  . When you arrive at healthcare provider, tell the healthcare staff immediately you have returned from  . visiting [place], [place], [place], [place] or [place]; or traveled in the [place] to [place], [place],  . [place] or [place] in the last two weeks or you have been in close contact with a person diagnosed with  . COVID-19 in the last 2 weeks.   . Tell the health care staff about your symptoms: fever, cough and shortness of breath. . After you have been seen by a medical provider, you will be either: o Tested for (COVID-19) and discharged home on quarantine except to seek medical care if  o symptoms worsen, and asked to  - Stay home and avoid contact with others until you get your results (4-5 days)  - Avoid travel on public transportation if possible (such as bus, train, or airplane) or o Sent to the Emergency Department by EMS for evaluation, COVID-19 testing  and  o possible admission depending on  your condition and test results.  What to do if you are LOW RISK for COVID-19?  Reduce your risk of any infection by using the same precautions used for avoiding the common cold or flu:  Marland Kitchen Wash your hands often with soap and warm water for at least 20 seconds.  If soap and water are not readily available,  . use an alcohol-based hand sanitizer with at least 60% alcohol.  . If coughing or sneezing, cover your mouth and nose by coughing or sneezing into the elbow areas of your shirt or coat, .  into a tissue or into your sleeve (not your hands). . Avoid shaking hands with others and consider head nods or verbal greetings only. . Avoid touching your eyes, nose, or mouth with unwashed hands.  . Avoid close contact with people who are  sick. . Avoid places or events with large numbers of people in one location, like concerts or sporting events. . Carefully consider travel plans you have or are making. . If you are planning any travel outside or inside the [place], visit the CDC's Travelers' Health webpage for the latest health notices. . If you have some symptoms but not all symptoms, continue to monitor at home and seek medical attention  . if your symptoms worsen. . If you are having a medical emergency, call 911.   . >>>>>>>>>>>>>>>>>>>>>>>>>>>>>>>>> . We Do NOT Approve of  [facility], [employer]  Our Patients  To Do Home Visits & We Do NOT Approve of [employer] > > > > > > > > > > > > > > > > > > > > > > > > > > > > > > > > > > > > > > >  Preventive Care for Adults  A healthy lifestyle and preventive care can promote health and wellness. Preventive health guidelines for women include the following key practices.  A routine yearly physical is a good way to check with your health care provider about your health and preventive screening. It is a chance to share any concerns and updates on your health and to receive a thorough exam.  Visit your dentist for a routine exam and preventive care every 6 months. Brush your teeth twice a day and floss once a day. Good oral hygiene prevents tooth decay and gum disease.  The frequency of eye exams is based on your age, health, family medical history, use of contact lenses, and other factors. Follow your health care provider's recommendations for frequency of eye exams.  Eat a healthy diet. Foods like vegetables, fruits, whole grains, low-fat dairy products, and lean protein foods contain the nutrients you need without too many calories. Decrease your intake of foods high in solid fats, added sugars, and salt. Eat the right amount of calories for you. Get information about a proper diet from your health care provider, if necessary.  Regular physical  exercise is one of the most important things you can do for your health. Most adults should get at least 150 minutes of moderate-intensity exercise (any activity that increases your heart rate and causes you to sweat) each week. In addition, most adults need muscle-strengthening exercises on 2 or more days a week.  Maintain a healthy weight. The body mass index (BMI) is a screening tool to identify possible weight problems. It provides an estimate of body fat based on height and weight. Your health care provider can find your BMI and can help you achieve or maintain a  healthy weight. For adults 20 years and older:  A BMI below 18.5 is considered underweight.  A BMI of 18.5 to 24.9 is normal.  A BMI of 25 to 29.9 is considered overweight.  A BMI of 30 and above is considered obese.  Maintain normal blood lipids and cholesterol levels by exercising and minimizing your intake of saturated fat. Eat a balanced diet with plenty of fruit and vegetables. If your lipid or cholesterol levels are high, you are over 50, or you are at high risk for heart disease, you may need your cholesterol levels checked more frequently. Ongoing high lipid and cholesterol levels should be treated with medicines if diet and exercise are not working.  If you smoke, find out from your health care provider how to quit. If you do not use tobacco, do not start.  Lung cancer screening is recommended for adults aged 44-80 years who are at high risk for developing lung cancer because of a history of smoking. A yearly low-dose CT scan of the lungs is recommended for people who have at least a 30-pack-year history of smoking and are a current smoker or have quit within the past 15 years. A pack year of smoking is smoking an average of 1 pack of cigarettes a day for 1 year (for example: 1 pack a day for 30 years or 2 packs a day for 15 years). Yearly screening should continue until the smoker has stopped smoking for at least 15 years.  Yearly screening should be stopped for people who develop a health problem that would prevent them from having lung cancer treatment.  Avoid use of street drugs. Do not share needles with anyone. Ask for help if you need support or instructions about stopping the use of drugs.  High blood pressure causes heart disease and increases the risk of stroke.  Ongoing high blood pressure should be treated with medicines if weight loss and exercise do not work.  If you are 54-53 years old, ask your health care provider if you should take aspirin to prevent strokes.  Diabetes screening involves taking a blood sample to check your fasting blood sugar level. This should be done once every 3 years, after age 60, if you are within normal weight and without risk factors for diabetes. Testing should be considered at a younger age or be carried out more frequently if you are overweight and have at least 1 risk factor for diabetes.  Breast cancer screening is essential preventive care for women. You should practice "breast self-awareness." This means understanding the normal appearance and feel of your breasts and may include breast self-examination. Any changes detected, no matter how small, should be reported to a health care provider. Women in their 68s and 30s should have a clinical breast exam (CBE) by a health care provider as part of a regular health exam every 1 to 3 years. After age 9, women should have a CBE every year. Starting at age 62, women should consider having a mammogram (breast X-ray test) every year. Women who have a family history of breast cancer should talk to their health care provider about genetic screening. Women at a high risk of breast cancer should talk to their health care providers about having an MRI and a mammogram every year.  Breast cancer gene (BRCA)-related cancer risk assessment is recommended for women who have family members with BRCA-related cancers. BRCA-related cancers include  breast, ovarian, tubal, and peritoneal cancers. Having family members with these cancers may be  associated with an increased risk for harmful changes (mutations) in the breast cancer genes BRCA1 and BRCA2. Results of the assessment will determine the need for genetic counseling and BRCA1 and BRCA2 testing.  Routine pelvic exams to screen for cancer are no longer recommended for nonpregnant women who are considered low risk for cancer of the pelvic organs (ovaries, uterus, and vagina) and who do not have symptoms. Ask your health care provider if a screening pelvic exam is right for you.  If you have had past treatment for cervical cancer or a condition that could lead to cancer, you need Pap tests and screening for cancer for at least 20 years after your treatment. If Pap tests have been discontinued, your risk factors (such as having a new sexual partner) need to be reassessed to determine if screening should be resumed. Some women have medical problems that increase the chance of getting cervical cancer. In these cases, your health care provider may recommend more frequent screening and Pap tests.    Colorectal cancer can be detected and often prevented. Most routine colorectal cancer screening begins at the age of 71 years and continues through age 32 years. However, your health care provider may recommend screening at an earlier age if you have risk factors for colon cancer. On a yearly basis, your health care provider may provide home test kits to check for hidden blood in the stool. Use of a small camera at the end of a tube, to directly examine the colon (sigmoidoscopy or colonoscopy), can detect the earliest forms of colorectal cancer. Talk to your health care provider about this at age 105, when routine screening begins.  Direct exam of the colon should be repeated every 5-10 years through age 38 years, unless early forms of pre-cancerous polyps or small growths are found.  Osteoporosis is a  disease in which the bones lose minerals and strength with aging. This can result in serious bone fractures or breaks. The risk of osteoporosis can be identified using a bone density scan. Women ages 3 years and over and women at risk for fractures or osteoporosis should discuss screening with their health care providers. Ask your health care provider whether you should take a calcium supplement or vitamin D to reduce the rate of osteoporosis.  Menopause can be associated with physical symptoms and risks. Hormone replacement therapy is available to decrease symptoms and risks. You should talk to your health care provider about whether hormone replacement therapy is right for you.  Use sunscreen. Apply sunscreen liberally and repeatedly throughout the day. You should seek shade when your shadow is shorter than you. Protect yourself by wearing long sleeves, pants, a wide-brimmed hat, and sunglasses year round, whenever you are outdoors.  Once a month, do a whole body skin exam, using a mirror to look at the skin on your back. Tell your health care provider of new moles, moles that have irregular borders, moles that are larger than a pencil eraser, or moles that have changed in shape or color.  Stay current with required vaccines (immunizations).  Influenza vaccine. All adults should be immunized every year.  Tetanus, diphtheria, and acellular pertussis (Td, Tdap) vaccine. Pregnant women should receive 1 dose of Tdap vaccine during each pregnancy. The dose should be obtained regardless of the length of time since the last dose. Immunization is preferred during the 27th-36th week of gestation. An adult who has not previously received Tdap or who does not know her vaccine status should receive 1  dose of Tdap. This initial dose should be followed by tetanus and diphtheria toxoids (Td) booster doses every 10 years. Adults with an unknown or incomplete history of completing a 3-dose immunization series with  Td-containing vaccines should begin or complete a primary immunization series including a Tdap dose. Adults should receive a Td booster every 10 years.    Zoster vaccine. One dose is recommended for adults aged 50 years or older unless certain conditions are present.    Pneumococcal 13-valent conjugate (PCV13) vaccine. When indicated, a person who is uncertain of her immunization history and has no record of immunization should receive the PCV13 vaccine. An adult aged 3 years or older who has certain medical conditions and has not been previously immunized should receive 1 dose of PCV13 vaccine. This PCV13 should be followed with a dose of pneumococcal polysaccharide (PPSV23) vaccine. The PPSV23 vaccine dose should be obtained at least 1 or more year(s) after the dose of PCV13 vaccine. An adult aged 35 years or older who has certain medical conditions and previously received 1 or more doses of PPSV23 vaccine should receive 1 dose of PCV13. The PCV13 vaccine dose should be obtained 1 or more years after the last PPSV23 vaccine dose.    Pneumococcal polysaccharide (PPSV23) vaccine. When PCV13 is also indicated, PCV13 should be obtained first. All adults aged 72 years and older should be immunized. An adult younger than age 60 years who has certain medical conditions should be immunized. Any person who resides in a nursing home or long-term care facility should be immunized. An adult smoker should be immunized. People with an immunocompromised condition and certain other conditions should receive both PCV13 and PPSV23 vaccines. People with human immunodeficiency virus (HIV) infection should be immunized as soon as possible after diagnosis. Immunization during chemotherapy or radiation therapy should be avoided. Routine use of PPSV23 vaccine is not recommended for American Indians, [place] Natives, or people younger than 65 years unless there are medical conditions that require PPSV23 vaccine. When  indicated, people who have unknown immunization and have no record of immunization should receive PPSV23 vaccine. One-time revaccination 5 years after the first dose of PPSV23 is recommended for people aged 19-64 years who have chronic kidney failure, nephrotic syndrome, asplenia, or immunocompromised conditions. People who received 1-2 doses of PPSV23 before age 79 years should receive another dose of PPSV23 vaccine at age 36 years or later if at least 5 years have passed since the previous dose. Doses of PPSV23 are not needed for people immunized with PPSV23 at or after age 82 years.   Preventive Services / Frequency  Ages 31 years and over  Blood pressure check.  Lipid and cholesterol check.  Lung cancer screening. / Every year if you are aged 39-80 years and have a 30-pack-year history of smoking and currently smoke or have quit within the past 15 years. Yearly screening is stopped once you have quit smoking for at least 15 years or develop a health problem that would prevent you from having lung cancer treatment.  Clinical breast exam.** / Every year after age 76 years.   BRCA-related cancer risk assessment.** / For women who have family members with a BRCA-related cancer (breast, ovarian, tubal, or peritoneal cancers).  Mammogram.** / Every year beginning at age 93 years and continuing for as long as you are in good health. Consult with your health care provider.  Pap test.** / Every 3 years starting at age 43 years through age 53 or 46 years  with 3 consecutive normal Pap tests. Testing can be stopped between 65 and 70 years with 3 consecutive normal Pap tests and no abnormal Pap or HPV tests in the past 10 years.  Fecal occult blood test (FOBT) of stool. / Every year beginning at age 36 years and continuing until age 61 years. You may not need to do this test if you get a colonoscopy every 10 years.  Flexible sigmoidoscopy or colonoscopy.** / Every 5 years for a flexible sigmoidoscopy  or every 10 years for a colonoscopy beginning at age 48 years and continuing until age 16 years.  Hepatitis C blood test.** / For all people born from [birth year] through 1967 and any individual with known risks for hepatitis C.  Osteoporosis screening.** / A one-time screening for women ages 24 years and over and women at risk for fractures or osteoporosis.  Skin self-exam. / Monthly.  Influenza vaccine. / Every year.  Tetanus, diphtheria, and acellular pertussis (Tdap/Td) vaccine.** / 1 dose of Td every 10 years.  Zoster vaccine.** / 1 dose for adults aged 46 years or older.  Pneumococcal 13-valent conjugate (PCV13) vaccine.** / Consult your health care provider.  Pneumococcal polysaccharide (PPSV23) vaccine.** / 1 dose for all adults aged 25 years and older. Screening for abdominal aortic aneurysm (AAA)  by ultrasound is recommended for people who have history of high blood pressure or who are current or former smokers. ++++++++++++++++++++ Recommend Adult Low Dose Aspirin or  coated  Aspirin 81 mg daily  To reduce risk of Colon Cancer 40 %,  Skin Cancer 26 % ,  Melanoma 46%  and  Pancreatic cancer 60% ++++++++++++++++++++ Vitamin D goal  is between 70-100.  Please make sure that you are taking your Vitamin D as directed.  It is very important as a natural anti-inflammatory  helping hair, skin, and nails, as well as reducing stroke and heart attack risk.  It helps your bones and helps with mood. It also decreases numerous cancer risks so please take it as directed.  Low Vit D is associated with a 200-300% higher risk for CANCER  and 200-300% higher risk for HEART   ATTACK  &  STROKE.   .....................................Marland Kitchen It is also associated with higher death rate at younger ages,  autoimmune diseases like Rheumatoid arthritis, Lupus, Multiple Sclerosis.    Also many other serious conditions, like depression, Alzheimer's Dementia, infertility, muscle aches, fatigue,  fibromyalgia - just to name a few. ++++++++++++++++++ Recommend the book "The END of DIETING" by Dr Excell Seltzer  & the book "The END of DIABETES " by Dr Excell Seltzer At [email or web address] - get book & Audio CD's    Being diabetic has a  300% increased risk for heart attack, stroke, cancer, and alzheimer- type vascular dementia. It is very important that you work harder with diet by avoiding all foods that are white. Avoid white rice (brown & wild rice is OK), white potatoes (sweetpotatoes in moderation is OK), White bread or wheat bread or anything made out of white flour like bagels, donuts, rolls, buns, biscuits, cakes, pastries, cookies, pizza crust, and pasta (made from white flour & egg whites) - vegetarian pasta or spinach or wheat pasta is OK. Multigrain breads like Arnold's or [employer], or multigrain sandwich thins or flatbreads.  Diet, exercise and weight loss can reverse and cure diabetes in the early stages.  Diet, exercise and weight loss is very important in the control and prevention of complications of diabetes which affects every system  in your body, ie. Brain - dementia/stroke, eyes - glaucoma/blindness, heart - heart attack/heart failure, kidneys - dialysis, stomach - gastric paralysis, intestines - malabsorption, nerves - severe painful neuritis, circulation - gangrene & loss of a leg(s), and finally cancer and Alzheimers.    I recommend avoid fried & greasy foods,  sweets/candy, white rice (brown or wild rice or Quinoa is OK), white potatoes (sweet potatoes are OK) - anything made from white flour - bagels, doughnuts, rolls, buns, biscuits,white and wheat breads, pizza crust and traditional pasta made of white flour & egg white(vegetarian pasta or spinach or wheat pasta is OK).  Multi-grain bread is OK - like multi-grain flat bread or sandwich thins. Avoid alcohol in excess. Exercise is also important.    Eat all the vegetables you want - avoid meat, especially red meat and dairy -  especially cheese.  Cheese is the most concentrated form of trans-fats which is the worst thing to clog up our arteries. Veggie cheese is OK which can be found in the fresh produce section at [employer] or [employer] or [employer]  +++++++++++++++++++ DASH Eating Plan  DASH stands for "Dietary Approaches to Stop Hypertension."   The DASH eating plan is a healthy eating plan that has been shown to reduce high blood pressure (hypertension). Additional health benefits may include reducing the risk of type 2 diabetes mellitus, heart disease, and stroke. The DASH eating plan may also help with weight loss. WHAT DO I NEED TO KNOW ABOUT THE DASH EATING PLAN? For the DASH eating plan, you will follow these general guidelines:  Choose foods with a percent daily value for sodium of less than 5% (as listed on the food label).  Use salt-free seasonings or herbs instead of table salt or sea salt.  Check with your health care provider or pharmacist before using salt substitutes.  Eat lower-sodium products, often labeled as "lower sodium" or "no salt added."  Eat fresh foods.  Eat more vegetables, fruits, and low-fat dairy products.  Choose whole grains. Look for the word "whole" as the first word in the ingredient list.  Choose fish   Limit sweets, desserts, sugars, and sugary drinks.  Choose heart-healthy fats.  Eat veggie cheese   Eat more home-cooked food and less restaurant, buffet, and fast food.  Limit fried foods.  Cook foods using methods other than frying.  Limit canned vegetables. If you do use them, rinse them well to decrease the sodium.  When eating at a restaurant, ask that your food be prepared with less salt, or no salt if possible.                      WHAT FOODS CAN I EAT? Read Dr Fara Olden Fuhrman's books on The End of Dieting & The End of Diabetes  Grains Whole grain or whole wheat bread. Brown rice. Whole grain or whole wheat pasta. Quinoa, bulgur, and whole grain  cereals. Low-sodium cereals. Corn or whole wheat flour tortillas. Whole grain cornbread. Whole grain crackers. Low-sodium crackers.  Vegetables Fresh or frozen vegetables (raw, steamed, roasted, or grilled). Low-sodium or reduced-sodium tomato and vegetable juices. Low-sodium or reduced-sodium tomato sauce and paste. Low-sodium or reduced-sodium canned vegetables.   Fruits All fresh, canned (in natural juice), or frozen fruits.  Protein Products  All fish and seafood.  Dried beans, peas, or lentils. Unsalted nuts and seeds. Unsalted canned beans.  Dairy Low-fat dairy products, such as skim or 1% milk, 2% or reduced-fat cheeses, low-fat  ricotta or cottage cheese, or plain low-fat yogurt. Low-sodium or reduced-sodium cheeses.  Fats and Oils Tub margarines without trans fats. Light or reduced-fat mayonnaise and salad dressings (reduced sodium). Avocado. Safflower, olive, or canola oils. Natural peanut or almond butter.  Other Unsalted popcorn and pretzels. The items listed above may not be a complete list of recommended foods or beverages. Contact your dietitian for more options.  +++++++++++++++  WHAT FOODS ARE NOT RECOMMENDED? Grains/ White flour or wheat flour White bread. White pasta. White rice. Refined cornbread. Bagels and croissants. Crackers that contain trans fat.  Vegetables  Creamed or fried vegetables. Vegetables in a . Regular canned vegetables. Regular canned tomato sauce and paste. Regular tomato and vegetable juices.  Fruits Dried fruits. Canned fruit in light or heavy syrup. Fruit juice.  Meat and Other Protein Products Meat in general - RED meat & White meat.  Fatty cuts of meat. Ribs, chicken wings, all processed meats as bacon, sausage, bologna, salami, fatback, hot dogs, bratwurst and packaged luncheon meats.  Dairy Whole or 2% milk, cream, half-and-half, and cream cheese. Whole-fat or sweetened yogurt. Full-fat cheeses or blue cheese. Non-dairy creamers and  whipped toppings. Processed cheese, cheese spreads, or cheese curds.  Condiments Onion and garlic salt, seasoned salt, table salt, and sea salt. Canned and packaged gravies. Worcestershire sauce. Tartar sauce. Barbecue sauce. Teriyaki sauce. Soy sauce, including reduced sodium. Steak sauce. Fish sauce. Oyster sauce. Cocktail sauce. Horseradish. Ketchup and mustard. Meat flavorings and tenderizers. Bouillon cubes. Hot sauce. Tabasco sauce. Marinades. Taco seasonings. Relishes.  Fats and Oils Butter, stick margarine, lard, shortening and bacon fat. Coconut, palm kernel, or palm oils. Regular salad dressings.  Pickles and olives. Salted popcorn and pretzels.  The items listed above may not be a complete list of foods and beverages to avoid.

## 2019-06-04 NOTE — Progress Notes (Signed)
FOLLOW UP  Assessment / Plan    Debra Short was seen today for follow-up.  Diagnoses and all orders for this visit:  Essential hypertension Continue current medications: Monitor blood pressure at home; call if consistently over 130/80 Continue DASH diet.   Reminder to go to the ER if any CP, SOB, nausea, dizziness, severe HA, changes vision/speech, left arm numbness and tingling and jaw pain. -     COMPLETE METABOLIC PANEL WITH GFR -     CBC with Diff -     TSH -     Magnesium  Gastroesophageal reflux disease with esophagitis and hemorrhage Doing well at this time Esomeprazole PRN Diet discussed Monitor for triggers Avoid food with high acid content Avoid excessive cafeine Increase water intake  Seasonal allergic rhinitis due to pollen Doing well at this time -     montelukast (SINGULAIR) 10 MG tablet; TAKE 1 TABLET DAILY FOR ALLERGIES  Hyperlipidemia associated with type 2 diabetes mellitus (HCC) Continue medications: Atorvastatin 1/3 tablet Discussed dietary and exercise modifications Low fat diet -     Lipid Profile  Type 2 diabetes mellitus with stage 2 chronic kidney disease, without long-term current use of insulin (HCC) Continue medications:metformin 500mg  two tablets twice a day Discussed general issues about diabetes pathophysiology and management. Education: Reviewed 'ABCs' of diabetes management (respective goals in parentheses):  A1C (<7), blood pressure (<130/80), and cholesterol (LDL <70) Dietary recommendations Encouraged aerobic exercise.  Discussed foot care, check daily Yearly retinal exam Dental exam every 6 months Monitor blood glucose, discussed goal for patient   CKD stage 2 due to type 2 diabetes mellitus ([place]) Discussed dietary and exercise modifications -     Hemoglobin A1c ([facility])  Vitamin D deficiency Continue supplementation Taking Vitamin D * IU daily -     Vitamin D (25 hydroxy)  B12 deficiency -     Vitamin B12  Otalgia,  right -     Ear Lavage, tolerated well Canal clear, TN's intact Discuss ear hygeine  Irritable bowel syndrome, unspecified type If not on benefiber then add it, decrease stress,  if any worsening symptoms, blood in stool, AB pain, etc call office  BMi 24 Continue to recommend diet heavy in fruits and veggies and low in animal meats, cheeses, and dairy products, appropriate calorie intake Discuss exercise recommendations routinely Continue to monitor weight at each visit  Osteopenia Repeat DEXA 2021, continue Vit D and Ca, weight bearing exercises  OAB Managing at this time Wearing incontinence pads Improved with oxybutynin  BMI 25.0-25.9 Over 30 minutes of interview,  exam, counseling, chart review, and critical decision making was performed  Future Appointments  Date Time Provider [place]  09/10/2019  2:00 PM Unk [name], MD [facility] None  12/11/2019  2:00 PM Liane Comber, NP [facility] None     Subjective:   Debra Short is a 77 y.o. female who presents for 3 month follow up on HTN, DMII, HLD, weight, OAB, GERD, Osteopenia and vitamin D def.   She is thinking about applying to [employer] homes as she feels she is ready for extra assistance but has been delaying related to covid 19.   Non-hodgkin's was treated by Dr. Jana Hakim, in remission and monitored at this office.   Patient has a long standing history of fibromyalgia syndrome followed by Dr Estanislado Pandy and previously prescribed hydrocodone but this was d/c'd related to concerns with falling, and she has been doing outpatient therapy. She is on prozac 40 mg daily,  PRN xanax for depression/anxiety and insomnia, was seeing psych but hasn't followed up recently, she takes 1/4-1/2 tab PRN and estimates 1 month supply lasts 4 months. She also takes gabapentin 600 mg at night, 300 mg during the day.   She is on oxybutynin for OAB and perceives significant benefit with this.   BMI is Body  mass index is 25.12 kg/m., she has been working on diet, exercise is limited. She has been doing meal replacement green shake (1 meal per day) and is down from 172 lb to 148 lb today. Plans to start walking on treadmill at home.  Wt Readings from Last 3 Encounters:  06/04/19 149 lb 12.8 oz (67.9 kg)  03/04/19 148 lb 9.6 oz (67.4 kg)  11/26/18 148 lb 6.4 oz (67.3 kg)   Her blood pressure has been controlled at home, today their BP is BP: (!) 146/84 She does not workout. She denies chest pain, shortness of breath, dizziness. She will occasionally get some walking in around the house or do stretches but is not formally exercising.    She is on cholesterol medication and denies myalgias. Her cholesterol is at goal. The cholesterol last visit was:   Lab Results  Component Value Date   CHOL 146 03/04/2019   HDL 54 03/04/2019   LDLCALC 71 03/04/2019   TRIG 125 03/04/2019   CHOLHDL 2.7 03/04/2019   She has been working on diet/exercise for T2DM well controlled on metformin, most recently back into normal range. She has been taking metformin takes 2-3 tabs daily depending on diet. She denies hypoglycemia, extremity paraesthesias, unintentional weight loss, polydipsia, polyuria, blurry vision. She admits she does not check fasting glucose.  Lab Results  Component Value Date   HGBA1C 5.4 03/04/2019   Last GFR Lab Results  Component Value Date   GFRNONAA 82 03/04/2019   Patient is on Vitamin D supplement. Lab Results  Component Value Date   VD25OH 58 03/04/2019        Medication Review Current Outpatient Medications on File Prior to Visit  Medication Sig Dispense Refill  . ALPRAZolam (XANAX) 1 MG tablet TAKE 1/2-1 TABS 2-3 TIMES/DAY ONLY IF NEEDED FOR ANXIETY ATTACK&LIMIT TO 5 DAYS/WEEK TO AVOID ADDICTION 90 tablet 0  . aspirin EC 81 MG tablet Take 81 mg by mouth daily.    Marland Kitchen atorvastatin (LIPITOR) 80 MG tablet Take 1/3 tablet Daily for Cholesterol 90 tablet 1  . bisoprolol (ZEBETA) 5 MG  tablet TAKE 1 TABLET EVERY MORNING FOR BLOOD PRESSURE 90 tablet 1  . Esomeprazole Magnesium (NEXIUM PO) Take 22.3 mg by mouth See admin instructions. Take 1 capsule (22.3 mg OTC) by mouth every morning before breakfast, may also take 1 capsule in the evening as needed for acid reflux    . fluticasone (FLONASE) 50 MCG/ACT nasal spray Place 2 sprays into both nostrils daily. 48 g 1  . gabapentin (NEURONTIN) 300 MG capsule TAKE 2 CAPSULES TWICE A DAY 360 capsule 1  . metFORMIN (GLUCOPHAGE-XR) 500 MG 24 hr tablet Take 1 to 4 tablets daily as directed for Diabetes 360 tablet 1  . montelukast (SINGULAIR) 10 MG tablet TAKE 1 TABLET DAILY FOR ALLERGIES 90 tablet 1  . Multiple Vitamin (MULTIVITAMIN WITH MINERALS) TABS tablet Take 1 tablet by mouth daily.    . Multiple Vitamins-Minerals (OCUVITE PO) Take by mouth 2 (two) times a day.    . oxybutynin (DITROPAN) 5 MG tablet TAKE 1/2 TO 1 TABLET 2 TIMES DAILY FOR BLADDER 180 tablet 12  .  triamcinolone (NASACORT) 55 MCG/ACT AERO nasal inhaler Place 2 sprays into both nostrils daily as needed (for seasonal allergies).     Marland Kitchen atenolol (TENORMIN) 50 MG tablet Take 1 tablet (50 mg total) by mouth daily. (Patient not taking: Reported on 06/04/2019) 90 tablet 1  . FLUoxetine (PROZAC) 40 MG capsule Take 1 capsule Daily for Mood (Patient not taking: Reported on 06/04/2019) 90 capsule 3   No current facility-administered medications on file prior to visit.    Allergies: Allergies  Allergen Reactions  . Effexor [Venlafaxine] Other (See Comments)    Not effective  . Nortriptyline Other (See Comments)    Not effective  . Paxil [Paroxetine Hcl] Other (See Comments)    Not effective  . Robaxin [Methocarbamol] Itching  . Tizanidine Itching  . Zoloft [Sertraline Hcl] Other (See Comments)    Not effective    Current Problems (verified) has Hyperlipidemia associated with type 2 diabetes mellitus ([place]); Essential hypertension; GERD (gastroesophageal reflux disease);  Vitamin D deficiency; IBS (irritable bowel syndrome); Type 2 diabetes mellitus with stage 2 chronic kidney disease, without long-term current use of insulin ([place]); Non Hodgkin's lymphoma ([place]); Medication management; Insomnia; CKD stage 2 due to type 2 diabetes mellitus ([place]); Degenerative disc disease, lumbar; Primary osteoarthritis of both knees; Fibromyalgia syndrome; Depression, major, recurrent, in partial remission ([place]); BMI 24.0-24.9, adult; OAB (overactive bladder); and Osteopenia on their problem list.  Screening Tests Immunization History  Administered Date(s) Administered  . Influenza Split 04/10/2013, 03/23/2016  . Influenza, High Dose Seasonal PF 03/24/2014, 02/27/2015  . Influenza-Unspecified 03/27/2018  . Pneumococcal Conjugate-13 03/24/2014  . Pneumococcal Polysaccharide-23 06/13/2008  . Td 09/24/2012  . Tdap 09/26/2014  . Zoster 02/23/2011    Preventative care: Last colonoscopy: 2013, diverticulosis, no polyps, Dr. Oletta Lamas, was recommended 5 year follow up that never happened due to pneumonia, she will call to inquire Last mammogram:  10/2017 - patient will schedule DEXA 10/2017 - T -1.4 forearm   Prior vaccinations: TD or Tdap: 2016   Influenza: 03/2018  Pneumococcal: 2010 Prevnar13: 2015 Shingles/Zostavax: 2012  Names of Other Physician/Practitioners you currently use: 1. [facility]- here for primary care 2. [facility], eye doctor, 10/25/2018 - report abstracted, has follow up tomorrow due to decreased vision and failing drivers test  3. Dr. Johnn Hai, dentist, last visit q67months 2019  Patient Care Team: Unk [name], MD as PCP - General (Internal Medicine) Bo Merino, MD as Consulting Physician (Rheumatology) [name], MD as Consulting Physician (Gastroenterology) Allyn Kenner, MD (Dermatology)  Surgical: She  has a past surgical history that includes ORIF ankle fracture (Left, 09/26/2014);  Abdominal hysterectomy; Tonsillectomy; and ORIF ankle fracture (Left, 09/26/2014). Family Her family history includes Cancer in her brother, father, maternal grandfather, and mother. Social history  She reports that she has never smoked. She has never used smokeless tobacco. She reports that she does not drink alcohol or use drugs.   Objective:   Today's Vitals   06/04/19 1545  BP: (!) 146/84  Pulse: 72  Resp: 16  Temp: (!) 97.2 F (36.2 C)  Weight: 149 lb 12.8 oz (67.9 kg)  Height: 5' 4.75" (1.645 m)   Body mass index is 25.12 kg/m. Wt Readings from Last 3 Encounters:  06/04/19 149 lb 12.8 oz (67.9 kg)  03/04/19 148 lb 9.6 oz (67.4 kg)  11/26/18 148 lb 6.4 oz (67.3 kg)    General appearance: alert, no distress, WD/WN,  female HEENT: normocephalic, sclerae anicteric, TMs pearly left, right cerumen impaction,  nares patent, no discharge or erythema, pharynx normal Oral cavity: MMM, no lesions Neck: supple, no lymphadenopathy, no thyromegaly, + nodules on thyroid left side more than right, no masses Heart: RRR, normal S1, S2, no murmurs Lungs: CTA bilaterally, no wheezes, rhonchi, or rales Abdomen: +bs, soft, non tender, non distended, no masses, no hepatomegaly, no splenomegaly Musculoskeletal: nontender, no swelling, no obvious deformity Extremities: no edema, no cyanosis, no clubbing Pulses: 2+ symmetric, upper and lower extremities, normal cap refill Neurological: alert, oriented x 3, CN2-12 intact, strength normal upper extremities and lower extremities, sensation normal throughout, DTRs 2+ throughout, no cerebellar signs, gait slow Psychiatric: normal affect, behavior normal, pleasant  Breast: defer Gyn: defer Rectal: defer   Garnet Sierras, NP [facility] 06/04/2019  3:45 PM

## 2019-06-05 LAB — LIPID PANEL
Cholesterol: 141 mg/dL (ref <200)
HDL: 54 mg/dL (ref >=50)
LDL Cholesterol (Calc): 67 mg/dL (calc)
Non-HDL Cholesterol (Calc): 87 mg/dL (calc) (ref <130)
Total CHOL/HDL Ratio: 2.6 (calc) (ref <5.0)
Triglycerides: 112 mg/dL (ref <150)

## 2019-06-05 LAB — CBC WITH DIFFERENTIAL/PLATELET
Absolute Monocytes: 512 cells/uL (ref 200–950)
Basophils Absolute: 38 cells/uL (ref 0–200)
Basophils Relative: 0.6 %
Eosinophils Absolute: 198 cells/uL (ref 15–500)
Eosinophils Relative: 3.1 %
HCT: 40.4 % (ref 35.0–45.0)
Hemoglobin: 13.5 g/dL (ref 11.7–15.5)
Lymphs Abs: 1926 cells/uL (ref 850–3900)
MCH: 31.2 pg (ref 27.0–33.0)
MCHC: 33.4 g/dL (ref 32.0–36.0)
MCV: 93.3 fL (ref 80.0–100.0)
MPV: 9.6 fL (ref 7.5–12.5)
Monocytes Relative: 8 %
Neutro Abs: 3725 cells/uL (ref 1500–7800)
Neutrophils Relative %: 58.2 %
Platelets: 281 10*3/uL (ref 140–400)
RBC: 4.33 10*6/uL (ref 3.80–5.10)
RDW: 12 % (ref 11.0–15.0)
Total Lymphocyte: 30.1 %
WBC: 6.4 10*3/uL (ref 3.8–10.8)

## 2019-06-05 LAB — COMPLETE METABOLIC PANEL WITH GFR
AG Ratio: 1.7 (calc) (ref 1.0–2.5)
ALT: 14 U/L (ref 6–29)
AST: 19 U/L (ref 10–35)
Albumin: 4.5 g/dL (ref 3.6–5.1)
Alkaline phosphatase (APISO): 61 U/L (ref 37–153)
BUN: 15 mg/dL (ref 7–25)
CO2: 31 mmol/L (ref 20–32)
Calcium: 10 mg/dL (ref 8.6–10.4)
Chloride: 99 mmol/L (ref 98–110)
Creat: 0.81 mg/dL (ref 0.60–0.88)
GFR, Est African American: 79 mL/min/{1.73_m2} (ref >=60)
GFR, Est Non African American: 69 mL/min/{1.73_m2} (ref >=60)
Globulin: 2.7 g/dL (calc) (ref 1.9–3.7)
Glucose, Bld: 87 mg/dL (ref 65–99)
Potassium: 4.6 mmol/L (ref 3.5–5.3)
Sodium: 139 mmol/L (ref 135–146)
Total Bilirubin: 1 mg/dL (ref 0.2–1.2)
Total Protein: 7.2 g/dL (ref 6.1–8.1)

## 2019-06-05 LAB — HEMOGLOBIN A1C
Hgb A1c MFr Bld: 5.7 % of total Hgb — ABNORMAL HIGH (ref <5.7)
Mean Plasma Glucose: 117 (calc)
eAG (mmol/L): 6.5 (calc)

## 2019-06-05 LAB — TSH: TSH: 1.8 mIU/L (ref 0.40–4.50)

## 2019-06-05 LAB — VITAMIN D 25 HYDROXY (VIT D DEFICIENCY, FRACTURES): Vit D, 25-Hydroxy: 67 ng/mL (ref 30–100)

## 2019-06-05 LAB — MAGNESIUM: Magnesium: 1.8 mg/dL (ref 1.5–2.5)

## 2019-06-05 LAB — VITAMIN B12: Vitamin B-12: 1548 pg/mL — ABNORMAL HIGH (ref 200–1100)

## 2019-07-26 DIAGNOSIS — Z23 Encounter for immunization: Secondary | ICD-10-CM | POA: Diagnosis not present

## 2019-08-02 ENCOUNTER — Other Ambulatory Visit: Payer: Self-pay | Admitting: Adult Health Nurse Practitioner

## 2019-08-02 DIAGNOSIS — F411 Generalized anxiety disorder: Secondary | ICD-10-CM

## 2019-08-04 ENCOUNTER — Other Ambulatory Visit: Payer: Self-pay | Admitting: Internal Medicine

## 2019-08-20 ENCOUNTER — Other Ambulatory Visit: Payer: Self-pay | Admitting: Internal Medicine

## 2019-09-09 ENCOUNTER — Encounter: Payer: Self-pay | Admitting: Internal Medicine

## 2019-09-09 NOTE — Progress Notes (Signed)
Annual Screening/Preventative Visit & Comprehensive Evaluation &  Examination     This very nice 79 y.o.  WWF presents for a Screening /Preventative Visit & comprehensive evaluation and management of multiple medical co-morbidities.  Patient has been followed for HTN, HLD, T2_NIDDM  and Vitamin D Deficiency.  Patient gas GERD controlled with diet & Nexium. Patient also has a very remote hx/o Non Hodgin's Lymphoma (1981) , cured.      HTN predates since 2012. Patient's BP has been controlled at home and patient denies any cardiac symptoms as chest pain, palpitations, shortness of breath, dizziness or ankle swelling. Today's BP is at goal - 116/80.      Patient's hyperlipidemia is controlled with diet and Atorvastatin. Patient denies myalgias or other medication SE's. Last lipids were at goal:  Lab Results  Component Value Date   CHOL 141 06/04/2019   HDL 54 06/04/2019   LDLCALC 67 06/04/2019   TRIG 112 06/04/2019   CHOLHDL 2.6 06/04/2019       Patient has hx/o Pre_DM in 2009 and was started on Metformin in 2016 for  T2_NIDDM.  Patient has CKD2 (GFR 69).  Patient denies reactive hypoglycemic symptoms, visual blurring, diabetic polys or paresthesias. Last A1c was near goal:  Lab Results  Component Value Date   HGBA1C 5.7 (H) 06/04/2019       Finally, patient has history of Vitamin D Deficiency ("25" / 2009)  and last Vitamin D was at goal:  Lab Results  Component Value Date   VD25OH 67 06/04/2019    Current Outpatient Medications on File Prior to Visit  Medication Sig  . ALPRAZolam (XANAX) 1 MG tablet TAKE 1/2-1 TABS 2-3 TIMES/DAY ONLY IF NEEDED FOR ANXIETY ATTACK&LIMIT TO 5 DAYS/WEEK TO AVOID ADDICTION  . Ascorbic Acid (VITAMIN C PO) Take 1 tablet by mouth daily.  Marland Kitchen aspirin EC 81 MG tablet Take 81 mg by mouth daily.  Marland Kitchen atorvastatin (LIPITOR) 80 MG tablet Take 1/3 tablet Daily for Cholesterol  . Esomeprazole Magnesium (NEXIUM PO) Take 22.3 mg by mouth See admin instructions. Take  1 capsule (22.3 mg OTC) by mouth every morning before breakfast, may also take 1 capsule in the evening as needed for acid reflux  . FLUoxetine (PROZAC) 40 MG capsule Take 1 capsule Daily for Mood  . fluticasone (FLONASE) 50 MCG/ACT nasal spray SPRAY 2 SPRAYS INTO EACH NOSTRIL EVERY DAY  . gabapentin (NEURONTIN) 300 MG capsule TAKE 2 CAPSULES TWICE A DAY  . metFORMIN (GLUCOPHAGE-XR) 500 MG 24 hr tablet Take 1 to 4 tablets daily as directed for Diabetes  . montelukast (SINGULAIR) 10 MG tablet TAKE 1 TABLET DAILY FOR ALLERGIES  . Multiple Vitamins-Minerals (OCUVITE PO) Take by mouth 2 (two) times a day.  . triamcinolone (NASACORT) 55 MCG/ACT AERO nasal inhaler Place 2 sprays into both nostrils daily as needed (for seasonal allergies).   Marland Kitchen VITAMIN D PO Take 5,000 Units by mouth daily.  Marland Kitchen zinc gluconate 50 MG tablet Take 50 mg by mouth daily.  Marland Kitchen atenolol (TENORMIN) 50 MG tablet Take 1 tablet (50 mg total) by mouth daily. (Patient not taking: Reported on 06/04/2019)  . bisoprolol (ZEBETA) 5 MG tablet TAKE 1 TABLET EVERY MORNING FOR BLOOD PRESSURE (Patient not taking: Reported on 09/10/2019)  . oxybutynin (DITROPAN) 5 MG tablet TAKE 1/2 TO 1 TABLET 2 TIMES DAILY FOR BLADDER  . solifenacin (VESICARE) 10 MG tablet Take 1 tablet Daily for Bladder   No current facility-administered medications on file prior to visit.  Allergies  Allergen Reactions  . Effexor [Venlafaxine] Other (See Comments)    Not effective  . Nortriptyline Other (See Comments)    Not effective  . Paxil [Paroxetine Hcl] Other (See Comments)    Not effective  . Robaxin [Methocarbamol] Itching  . Tizanidine Itching  . Zoloft [Sertraline Hcl] Other (See Comments)    Not effective   Past Medical History:  Diagnosis Date  . Allergy   . Depression with anxiety 10/06/2014  . Diabetes mellitus without complication ([place])   . Fibromyalgia   . GERD (gastroesophageal reflux disease)   . History of migraine 08/25/2016  .  Hyperlipidemia   . Hypertension   . IBS (irritable bowel syndrome)   . 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 Vitamin D deficiency    Health Maintenance  Topic Date Due  . INFLUENZA VACCINE  01/12/2019  . URINE MICROALBUMIN  08/20/2019  . OPHTHALMOLOGY EXAM  10/25/2019  . HEMOGLOBIN A1C  12/03/2019  . FOOT EXAM  09/08/2020  . TETANUS/TDAP  09/25/2024  . DEXA SCAN  Completed  . PNA vac Low Risk Adult  Completed   Immunization History  Administered Date(s) Administered  . Influenza Split 04/10/2013, 03/23/2016  . Influenza, High Dose Seasonal PF 03/24/2014, 02/27/2015  . Influenza-Unspecified 03/27/2018  . Moderna SARS-COVID-2 Vaccination 07/26/2019, 08/23/2019  . Pneumococcal Conjugate-13 03/24/2014  . Pneumococcal Polysaccharide-23 06/13/2008  . Td 09/24/2012  . Tdap 09/26/2014  . Zoster 02/23/2011    Last Colon - 08/08/2011 Colon - Arty Baumgartner - recc repeat 5 years (2018) - overdue  & aged out  Last MGM - 11/08/2017  Past Surgical History:  Procedure Laterality Date  . ABDOMINAL HYSTERECTOMY    . ORIF ANKLE FRACTURE Left 09/26/2014  . ORIF ANKLE FRACTURE Left 09/26/2014   Procedure: OPEN REDUCTION INTERNAL FIXATION (ORIF) ANKLE FRACTURE;  Surgeon: [name], MD;  Location: [place] [facility];  Service: Orthopedics;  Laterality: Left;  . TONSILLECTOMY     Family History  Problem Relation Age of Onset  . Cancer Mother   . Cancer Father   . Cancer Brother   . Cancer Maternal Grandfather    Social History   Tobacco Use  . Smoking status: Never Smoker  . Smokeless tobacco: Never Used  Substance Use Topics  . Alcohol use: No  . Drug use: No    ROS Constitutional: Denies fever, chills, weight loss/gain, headaches, insomnia,  night sweats, and change in appetite. Does c/o fatigue. Eyes: Denies redness, blurred vision, diplopia, discharge, itchy, watery eyes.  ENT: Denies discharge, congestion, post nasal drip, epistaxis, sore throat, earache, hearing loss, dental pain, Tinnitus,  Vertigo, Sinus pain, snoring.  Cardio: Denies chest pain, palpitations, irregular heartbeat, syncope, dyspnea, diaphoresis, orthopnea, PND, claudication, edema Respiratory: denies cough, dyspnea, DOE, pleurisy, hoarseness, laryngitis, wheezing.  Gastrointestinal: Denies dysphagia, heartburn, reflux, water brash, pain, cramps, nausea, vomiting, bloating, diarrhea, constipation, hematemesis, melena, hematochezia, jaundice, hemorrhoids Genitourinary: Denies dysuria, frequency, urgency, nocturia, hesitancy, discharge, hematuria, flank pain Breast: Breast lumps, nipple discharge, bleeding.  Musculoskeletal: Denies arthralgia, myalgia, stiffness, Jt. Swelling, pain, limp, and strain/sprain. Denies falls. Skin: Denies puritis, rash, hives, warts, acne, eczema, changing in skin lesion Neuro: No weakness, tremor, incoordination, spasms, paresthesia, pain Psychiatric: Denies confusion, memory loss, sensory loss. Denies Depression. Endocrine: Denies change in weight, skin, hair change, nocturia, and paresthesia, diabetic polys, visual blurring, hyper / hypo glycemic episodes.  Heme/Lymph: No excessive bleeding, bruising, enlarged lymph nodes.  Physical Exam  BP 116/80   Pulse 92   Temp (!)  97 F (36.1 C)   Resp 16   Ht 5' 4.5" (1.638 m)   Wt 153 lb 3.2 oz (69.5 kg)   BMI 25.89 kg/m   General Appearance: Well nourished, well groomed and in no apparent distress.  Eyes: PERRLA, EOMs, conjunctiva no swelling or erythema, normal fundi and vessels. Sinuses: No frontal/maxillary tenderness ENT/Mouth: EACs patent / TMs  nl. Nares clear without erythema, swelling, mucoid exudates. Oral hygiene is good. No erythema, swelling, or exudate. Tongue normal, non-obstructing. Tonsils not swollen or erythematous. Hearing normal.  Neck: Supple, thyroid not palpable. No bruits, nodes or JVD. Respiratory: Respiratory effort normal.  BS equal and clear bilateral without rales, rhonci, wheezing or stridor. Cardio:  Heart sounds are normal with regular rate and rhythm and no murmurs, rubs or gallops. Peripheral pulses are normal and equal bilaterally without edema. No aortic or femoral bruits. Chest: symmetric with normal excursions and percussion. Breasts: Symmetric, without lumps, nipple discharge, retractions, or fibrocystic changes.  Abdomen: Flat, soft with bowel sounds active. Nontender, no guarding, rebound, hernias, masses, or organomegaly.  Lymphatics: Non tender without lymphadenopathy.  Genitourinary:  Musculoskeletal: Full ROM all peripheral extremities, joint stability, 5/5 strength, and normal gait. Skin: Warm and dry without rashes, lesions, cyanosis, clubbing or  ecchymosis.  Neuro: Cranial nerves intact, reflexes equal bilaterally. Normal muscle tone, no cerebellar symptoms. Sensation intact.  Pysch: Alert and oriented X 3, normal affect, Insight and Judgment appropriate.   Assessment and Plan  1. Annual Preventative Screening Examination  2. Essential hypertension  - EKG 12-Lead - Urinalysis, Routine w reflex microscopic - Microalbumin / creatinine urine ratio - CBC with Differential/Platelet - COMPLETE METABOLIC PANEL WITH GFR - Magnesium - TSH  3. Hyperlipidemia associated with type 2 diabetes mellitus ([place])  - EKG 12-Lead - Lipid panel - TSH  4. Type 2 diabetes mellitus with stage 2 chronic kidney disease,  without long-term current use of insulin ([facility]CC)  - EKG 12-Lead - Urinalysis, Routine w reflex microscopic - Microalbumin / creatinine urine ratio - HM DIABETES FOOT EXAM - LOW EXTREMITY NEUR EXAM DOCUM - Insulin, random - Hemoglobin A1c  5. Vitamin D deficiency  - VITAMIN D 25 Hydroxy  6. Gastroesophageal reflux disease  - CBC with Differential/Platelet  7. B12 deficiency  - Vitamin B12  8. History of non-Hodgkin's lymphoma   9. Screening for colorectal cancer  - POC Hemoccult Bld/Stl   10. Screening for ischemic heart disease  - EKG  12-Lead  11. Medication management  - Urinalysis, Routine w reflex microscopic - Microalbumin / creatinine urine ratio - CBC with Differential/Platelet - COMPLETE METABOLIC PANEL WITH GFR - Magnesium - Lipid panel - TSH - Insulin, random - VITAMIN D 25 Hydroxy - Hemoglobin A1c - Vitamin B12        Patient was counseled in prudent diet to achieve/maintain BMI less than 25 for weight control, BP monitoring, regular exercise and medications. Discussed med's effects and SE's. Screening labs and tests as requested with regular follow-up as recommended. Over 40 minutes of exam, counseling, chart review and high complex critical decision making was performed.   Kirtland Bouchard, MD

## 2019-09-09 NOTE — Patient Instructions (Signed)

## 2019-09-10 ENCOUNTER — Ambulatory Visit (INDEPENDENT_AMBULATORY_CARE_PROVIDER_SITE_OTHER): Payer: PPO | Admitting: Internal Medicine

## 2019-09-10 ENCOUNTER — Other Ambulatory Visit: Payer: Self-pay

## 2019-09-10 VITALS — BP 116/80 | HR 92 | Temp 97.0°F | Resp 16 | Ht 64.5 in | Wt 153.2 lb

## 2019-09-10 DIAGNOSIS — E538 Deficiency of other specified B group vitamins: Secondary | ICD-10-CM | POA: Diagnosis not present

## 2019-09-10 DIAGNOSIS — E559 Vitamin D deficiency, unspecified: Secondary | ICD-10-CM | POA: Diagnosis not present

## 2019-09-10 DIAGNOSIS — K219 Gastro-esophageal reflux disease without esophagitis: Secondary | ICD-10-CM

## 2019-09-10 DIAGNOSIS — E1169 Type 2 diabetes mellitus with other specified complication: Secondary | ICD-10-CM | POA: Diagnosis not present

## 2019-09-10 DIAGNOSIS — Z0000 Encounter for general adult medical examination without abnormal findings: Secondary | ICD-10-CM

## 2019-09-10 DIAGNOSIS — E785 Hyperlipidemia, unspecified: Secondary | ICD-10-CM | POA: Diagnosis not present

## 2019-09-10 DIAGNOSIS — I10 Essential (primary) hypertension: Secondary | ICD-10-CM | POA: Diagnosis not present

## 2019-09-10 DIAGNOSIS — N182 Chronic kidney disease, stage 2 (mild): Secondary | ICD-10-CM | POA: Diagnosis not present

## 2019-09-10 DIAGNOSIS — Z0001 Encounter for general adult medical examination with abnormal findings: Secondary | ICD-10-CM

## 2019-09-10 DIAGNOSIS — Z79899 Other long term (current) drug therapy: Secondary | ICD-10-CM

## 2019-09-10 DIAGNOSIS — Z1211 Encounter for screening for malignant neoplasm of colon: Secondary | ICD-10-CM

## 2019-09-10 DIAGNOSIS — E1122 Type 2 diabetes mellitus with diabetic chronic kidney disease: Secondary | ICD-10-CM

## 2019-09-10 DIAGNOSIS — Z8572 Personal history of non-Hodgkin lymphomas: Secondary | ICD-10-CM

## 2019-09-10 DIAGNOSIS — Z136 Encounter for screening for cardiovascular disorders: Secondary | ICD-10-CM | POA: Diagnosis not present

## 2019-09-11 ENCOUNTER — Other Ambulatory Visit: Payer: Self-pay

## 2019-09-11 ENCOUNTER — Other Ambulatory Visit: Payer: Self-pay | Admitting: Internal Medicine

## 2019-09-11 DIAGNOSIS — N3000 Acute cystitis without hematuria: Secondary | ICD-10-CM

## 2019-09-11 LAB — URINALYSIS, ROUTINE W REFLEX MICROSCOPIC
Bilirubin Urine: NEGATIVE
Glucose, UA: NEGATIVE
Hgb urine dipstick: NEGATIVE
Ketones, ur: NEGATIVE
Nitrite: NEGATIVE
Protein, ur: NEGATIVE
Specific Gravity, Urine: 1.017 (ref 1.001–1.03)
pH: 6.5 (ref 5.0–8.0)

## 2019-09-11 LAB — COMPLETE METABOLIC PANEL WITH GFR
AG Ratio: 1.7 (calc) (ref 1.0–2.5)
ALT: 12 U/L (ref 6–29)
AST: 13 U/L (ref 10–35)
Albumin: 4.5 g/dL (ref 3.6–5.1)
Alkaline phosphatase (APISO): 62 U/L (ref 37–153)
BUN: 16 mg/dL (ref 7–25)
CO2: 33 mmol/L — ABNORMAL HIGH (ref 20–32)
Calcium: 10.3 mg/dL (ref 8.6–10.4)
Chloride: 101 mmol/L (ref 98–110)
Creat: 0.76 mg/dL (ref 0.60–0.88)
GFR, Est African American: 86 mL/min/{1.73_m2} (ref >=60)
GFR, Est Non African American: 74 mL/min/{1.73_m2} (ref >=60)
Globulin: 2.6 g/dL (calc) (ref 1.9–3.7)
Glucose, Bld: 104 mg/dL — ABNORMAL HIGH (ref 65–99)
Potassium: 4.9 mmol/L (ref 3.5–5.3)
Sodium: 142 mmol/L (ref 135–146)
Total Bilirubin: 0.8 mg/dL (ref 0.2–1.2)
Total Protein: 7.1 g/dL (ref 6.1–8.1)

## 2019-09-11 LAB — CBC WITH DIFFERENTIAL/PLATELET
Absolute Monocytes: 573 cells/uL (ref 200–950)
Basophils Absolute: 49 cells/uL (ref 0–200)
Basophils Relative: 0.8 %
Eosinophils Absolute: 342 cells/uL (ref 15–500)
Eosinophils Relative: 5.6 %
HCT: 41.3 % (ref 35.0–45.0)
Hemoglobin: 13.4 g/dL (ref 11.7–15.5)
Lymphs Abs: 1690 cells/uL (ref 850–3900)
MCH: 30.9 pg (ref 27.0–33.0)
MCHC: 32.4 g/dL (ref 32.0–36.0)
MCV: 95.4 fL (ref 80.0–100.0)
MPV: 9.8 fL (ref 7.5–12.5)
Monocytes Relative: 9.4 %
Neutro Abs: 3447 cells/uL (ref 1500–7800)
Neutrophils Relative %: 56.5 %
Platelets: 353 10*3/uL (ref 140–400)
RBC: 4.33 10*6/uL (ref 3.80–5.10)
RDW: 12.6 % (ref 11.0–15.0)
Total Lymphocyte: 27.7 %
WBC: 6.1 10*3/uL (ref 3.8–10.8)

## 2019-09-11 LAB — LIPID PANEL
Cholesterol: 139 mg/dL (ref <200)
HDL: 47 mg/dL — ABNORMAL LOW (ref >=50)
LDL Cholesterol (Calc): 68 mg/dL (calc)
Non-HDL Cholesterol (Calc): 92 mg/dL (calc) (ref <130)
Total CHOL/HDL Ratio: 3 (calc) (ref <5.0)
Triglycerides: 162 mg/dL — ABNORMAL HIGH (ref <150)

## 2019-09-11 LAB — HEMOGLOBIN A1C
Hgb A1c MFr Bld: 5.6 % of total Hgb (ref <5.7)
Mean Plasma Glucose: 114 (calc)
eAG (mmol/L): 6.3 (calc)

## 2019-09-11 LAB — TSH: TSH: 2.32 mIU/L (ref 0.40–4.50)

## 2019-09-11 LAB — INSULIN, RANDOM: Insulin: 38.4 u[IU]/mL — ABNORMAL HIGH

## 2019-09-11 LAB — MAGNESIUM: Magnesium: 1.8 mg/dL (ref 1.5–2.5)

## 2019-09-11 LAB — VITAMIN B12: Vitamin B-12: 978 pg/mL (ref 200–1100)

## 2019-09-11 LAB — VITAMIN D 25 HYDROXY (VIT D DEFICIENCY, FRACTURES): Vit D, 25-Hydroxy: 64 ng/mL (ref 30–100)

## 2019-09-11 LAB — MICROALBUMIN / CREATININE URINE RATIO
Creatinine, Urine: 106 mg/dL (ref 20–275)
Microalb Creat Ratio: 12 mcg/mg creat (ref <30)
Microalb, Ur: 1.3 mg/dL

## 2019-09-12 LAB — URINE CULTURE
MICRO NUMBER:: 10314415
SPECIMEN QUALITY:: ADEQUATE

## 2019-09-22 ENCOUNTER — Other Ambulatory Visit: Payer: Self-pay | Admitting: Internal Medicine

## 2019-09-22 DIAGNOSIS — E1121 Type 2 diabetes mellitus with diabetic nephropathy: Secondary | ICD-10-CM

## 2019-11-17 ENCOUNTER — Other Ambulatory Visit: Payer: Self-pay | Admitting: Adult Health

## 2019-11-17 DIAGNOSIS — F411 Generalized anxiety disorder: Secondary | ICD-10-CM

## 2019-12-05 ENCOUNTER — Other Ambulatory Visit: Payer: Self-pay | Admitting: Internal Medicine

## 2019-12-09 NOTE — Progress Notes (Signed)
MEDICARE ANNUAL WELLNESS VISIT AND FOLLOW UP  Assessment:   Encounter for Medicare annual wellness exam She will follow up ophthalmology, Dr. Oletta Lamas, and schedule mammogram with dexa   Atherosclerosis of aorta Per CXR 2019  Control blood pressure, cholesterol, glucose, increase exercise.   Essential hypertension - continue medications, DASH diet, exercise and monitor at home. Call if greater than 130/80.  -     CBC with Differential/Platelet -     CMP/GFR -     TSH  Controlled type 2 diabetes mellitus with diabetic nephropathy, without long-term current use of insulin (HCC) -     Hemoglobin A1c Discussed general issues about diabetes pathophysiology and management., Educational material distributed., Suggested low cholesterol diet., Encouraged aerobic exercise., Discussed foot care., Reminded to get yearly retinal exam and forward report Excellent recent controll; taper metformin as indicated by A1Cs  Type 2 diabetes mellitus with stage 2 chronic kidney disease, without long-term current use of insulin (HCC) -     Hemoglobin A1c Discussed general issues about diabetes pathophysiology and management., Educational material distributed., Suggested low cholesterol diet., Encouraged aerobic exercise., Discussed foot care., Reminded to get yearly retinal exam.  Degenerative disc disease, lumbar Followed by Dr. Estanislado Pandy   Primary osteoarthritis of both knees Followed by Dr. Estanislado Pandy  Mixed hyperlipidemia -     Lipid panel - -continue medications, check lipids, decrease fatty foods, increase activity.   Non-Hodgkin's lymphoma, unspecified body region, unspecified non-Hodgkin lymphoma type ([place]) Monitor symptoms, in remission, released by Dr. Jana Hakim  Depression, major, recurrent, in partial remission ([place]) Continue prozac, follow up with psych, benzo use is appropriate Lifestyle discussed: diet/exerise, sleep hygiene, stress management, hydration  Fibromyalgia  syndrome Continue medications, follow up with Dr. Estanislado Pandy  Medication management -     Magnesium  Vitamin D deficiency Continue supplement  Gastroesophageal reflux disease, esophagitis presence not specified Continue PPI/H2 blocker, diet discussed  Irritable bowel syndrome, unspecified type  If not on benefiber then add it, decrease stress,  if any worsening symptoms, blood in stool, AB pain, etc call office  BMi 24 Continue to recommend diet heavy in fruits and veggies and low in animal meats, cheeses, and dairy products, appropriate calorie intake Discuss exercise recommendations routinely Continue to monitor weight at each visit  Osteopenia Repeat DEXA 2021- ordered, patient will schedule, continue Vit D and Ca, weight bearing exercises  OAB Wears pull ups, improved with vesicare  Stye Advised warm compresses 3-4 times a day, gently clean eyelids with baby shampoo on qtip or wash cloth and massage lid Avoid wearing eye makeup and switch to new once resolved Call with any changes or if not resolving Ophthalmology for any vision changes or if not resolved in 2 weeks    Over 30 minutes of exam, counseling, chart review, and critical decision making was performed  Future Appointments  Date Time Provider [place]  03/12/2020  4:00 PM Unk [name], MD [facility] None  09/16/2020  2:00 PM Unk [name], MD [facility] None    Plan:   During the course of the visit the patient was educated and counseled about appropriate screening and preventive services including:    Pneumococcal vaccine   Influenza vaccine  Td vaccine  Prevnar 13  Screening electrocardiogram  Screening mammography  Bone densitometry screening  Colorectal cancer screening  Diabetes screening  Glaucoma screening  Nutrition counseling   Advanced directives: given info/requested copies   Subjective:   Debra Short is a 79 y.o. female who presents for Medicare Annual  Wellness Visit and 3 month follow up on hypertension, prediabetes, hyperlipidemia, vitamin D def.   She is thinking about applying to [employer] homes as she feels she is ready for extra assistance but has been deferring due to covid 19.   She presents with tender stye to R upper lid x 10-11 days, has been doing warm compresses with improvement   Non-hodgkin's was treated by Dr. Jana Hakim, in remission and monitored at this office.   Patient has a long standing history of fibromyalgia syndrome followed by Dr Estanislado Pandy and previously prescribed hydrocodone but this was d/c'd related to concerns with falling, and she has been doing outpatient therapy. She is on prozac 40 mg daily, PRN xanax for depression/anxiety and insomnia, was seeing psych but hasn't followed up recently, she takes 1/4-1/2 tab PRN and estimates 1 month supply lasts 4 months. She stopped gabapentin for pain due blurry vision, doing tylenol.   She is vesicare for OAB and perceives significant benefit with this.   IBS managed by lifestyle  BMI is Body mass index is 24.98 kg/m., she has been working on diet, exercise is limited. She has been doing meal replacement green shake (1 meal per day) and is down from 172 lb to 147 lb today.  Plans to start walking on treadmill at home.  Wt Readings from Last 3 Encounters:  12/11/19 147 lb 12.8 oz (67 kg)  09/10/19 153 lb 3.2 oz (69.5 kg)  06/04/19 149 lb 12.8 oz (67.9 kg)   Has aortic atherosclerosis per CXR 2019  Her blood pressure has been controlled at home, today their BP is BP: 120/74 Not on ACE/ARB due to low BPs She does not workout. She denies chest pain, shortness of breath, dizziness. She will occasionally get some walking in around the house or do stretches but is not formally exercising.    Takes atorvastatin 80 mg - takes 1/3 tab daily per patient, had myalgias with higher dose She is on cholesterol medication and denies myalgias. Her cholesterol is at  goal. The cholesterol last visit was:   Lab Results  Component Value Date   CHOL 139 09/10/2019   HDL 47 (L) 09/10/2019   LDLCALC 68 09/10/2019   TRIG 162 (H) 09/10/2019   CHOLHDL 3.0 09/10/2019   She has been working on diet/exercise for T2DM well controlled on metformin, most recently back into normal range. She has been taking metformin takes 2-3 tabs daily depending on diet. She denies hypoglycemia, extremity paraesthesias, unintentional weight loss, polydipsia, polyuria, blurry vision. She admits she does not check fasting glucose.  Lab Results  Component Value Date   HGBA1C 5.6 09/10/2019   CKD II associated with T2DM monitored at this office:  Lab Results  Component Value Date   [facility] 74 09/10/2019   Patient is on Vitamin D supplement. Lab Results  Component Value Date   VD25OH 64 09/10/2019        Medication Review Current Outpatient Medications on File Prior to Visit  Medication Sig Dispense Refill  . ALPRAZolam (XANAX) 1 MG tablet TAKE 1/2-1 TABS 2-3 TIMES/DAY ONLY IF NEEDED FOR ANXIETY ATTACK LIMIT TO 5 DAYS/WEEK TO AVOID ADDICTION 90 tablet 0  . Ascorbic Acid (VITAMIN C PO) Take 1 tablet by mouth daily.    Marland Kitchen aspirin EC 81 MG tablet Take 81 mg by mouth daily.    Marland Kitchen atorvastatin (LIPITOR) 80 MG tablet Take 1/3 tablet Daily for Cholesterol 90 tablet 1  . Esomeprazole Magnesium (NEXIUM PO) Take  22.3 mg by mouth See admin instructions. Take 1 capsule (22.3 mg OTC) by mouth every morning before breakfast, may also take 1 capsule in the evening as needed for acid reflux    . FLUoxetine (PROZAC) 40 MG capsule Take 1 capsule Daily for Mood 90 capsule 3  . fluticasone (FLONASE) 50 MCG/ACT nasal spray SPRAY 2 SPRAYS INTO EACH NOSTRIL EVERY DAY 48 mL 3  . metFORMIN (GLUCOPHAGE-XR) 500 MG 24 hr tablet Take 2 tablets 2 x  /day with Meals for Diabetes 360 tablet 1  . montelukast (SINGULAIR) 10 MG tablet TAKE 1 TABLET DAILY FOR ALLERGIES 90 tablet 1  . Multiple Vitamins-Minerals  (OCUVITE PO) Take by mouth 2 (two) times a day.    . solifenacin (VESICARE) 10 MG tablet Take 1 tablet Daily for Bladder Control 90 tablet 0  . triamcinolone (NASACORT) 55 MCG/ACT AERO nasal inhaler Place 2 sprays into both nostrils daily as needed (for seasonal allergies).     Marland Kitchen VITAMIN D PO Take 5,000 Units by mouth daily.    Marland Kitchen zinc gluconate 50 MG tablet Take 50 mg by mouth daily.     No current facility-administered medications on file prior to visit.    Allergies: Allergies  Allergen Reactions  . Effexor [Venlafaxine] Other (See Comments)    Not effective  . Nortriptyline Other (See Comments)    Not effective  . Paxil [Paroxetine Hcl] Other (See Comments)    Not effective  . Robaxin [Methocarbamol] Itching  . Tizanidine Itching  . Zoloft [Sertraline Hcl] Other (See Comments)    Not effective    Current Problems (verified) has Hyperlipidemia associated with type 2 diabetes mellitus ([place]); Essential hypertension; Gastroesophageal reflux disease; Vitamin D deficiency; IBS (irritable bowel syndrome); Type 2 diabetes mellitus with stage 2 chronic kidney disease, without long-term current use of insulin ([place]); Non Hodgkin's lymphoma ([place]); Medication management; Insomnia; CKD stage 2 due to type 2 diabetes mellitus ([place]); Degenerative disc disease, lumbar; Primary osteoarthritis of both knees; Fibromyalgia syndrome; Depression, major, recurrent, in partial remission ([place]); BMI 24.0-24.9, adult; OAB (overactive bladder); Osteopenia; and Aortic atherosclerosis (HCC) on their problem list.  Screening Tests Immunization History  Administered Date(s) Administered  . Influenza Split 04/10/2013, 03/23/2016  . Influenza, High Dose Seasonal PF 03/24/2014, 02/27/2015  . Influenza-Unspecified 03/27/2018  . Moderna SARS-COVID-2 Vaccination 07/26/2019, 08/23/2019  . Pneumococcal Conjugate-13 03/24/2014  . Pneumococcal Polysaccharide-23 06/13/2008  . Td 09/24/2012  . Tdap 09/26/2014  . Zoster  02/23/2011    Preventative care: Last colonoscopy: 2013, diverticulosis, never polyps, Dr. Oletta Lamas, was recommended 5 year follow up that never happened due to pneumonia, declines further due to age Last mammogram:  10/2017 - patient states will schedule  DEXA 10/2017 - T -1.4 forearm  PAP: remote, DONE    Prior vaccinations: TD or Tdap: 2016    Influenza: 03/2018  Pneumococcal: 2010 Prevnar13: 2015 Shingles/Zostavax: 2012 Covid 19: 2/2, 2021, moderna  Names of Other Physician/Practitioners you currently use: 1. [facility]- here for primary care 2. [facility], eye doctor, 10/25/2018 - DUE - patient will schedule  3. Dr. Johnn Hai, dentist, last visit q83months 2020  Patient Care Team: Unk [name], MD as PCP - General (Internal Medicine) Bo Merino, MD as Consulting Physician (Rheumatology) [name], MD (Inactive) as Consulting Physician (Gastroenterology) Allyn Kenner, MD (Dermatology)  Surgical: She  has a past surgical history that includes ORIF ankle fracture (Left, 09/26/2014); Abdominal hysterectomy; Tonsillectomy; and ORIF ankle fracture (Left, 09/26/2014). Family Her family history  includes Cancer in her brother, father, maternal grandfather, and mother. Social history  She reports that she has never smoked. She has never used smokeless tobacco. She reports that she does not drink alcohol and does not use drugs.  MEDICARE WELLNESS OBJECTIVES: Physical activity: Current Exercise Habits: The patient does not participate in regular exercise at present, Exercise limited by: None identified Cardiac risk factors: Cardiac Risk Factors include: advanced age (>57men, >33 women);dyslipidemia;hypertension;sedentary lifestyle;diabetes mellitus Depression/mood screen:   Depression screen [facility] 2/9 12/11/2019  Decreased Interest 0  Down, Depressed, Hopeless 0  PHQ - 2 Score 0  Altered sleeping 0  Tired, decreased energy 0   Change in appetite 0  Feeling bad or failure about yourself  0  Trouble concentrating 0  Moving slowly or fidgety/restless 0  Suicidal thoughts 0  PHQ-9 Score 0  Difficult doing work/chores Not difficult at all    ADLs:  In your present state of health, do you have any difficulty performing the following activities: 12/11/2019 09/09/2019  Hearing? 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 N N  Difficulty concentrating or making decisions? N N  Walking or climbing stairs? N N  Dressing or bathing? N N  Doing errands, shopping? N -  Some recent data might be hidden     Cognitive Testing  Alert? Yes  Normal Appearance?Yes  Oriented to person? Yes  Place? Yes   Time? Yes  Recall of three objects?  Yes  Can perform simple calculations? Yes  Displays appropriate judgment?Yes  Can read the correct time from a watch face?Yes  EOL planning: Does Patient Have a Medical Advance Directive?: Yes Type of Advance Directive: Healthcare Power of Attorney, Living will Does patient want to make changes to medical advance directive?: No - Patient declined Copy of [place] in Chart?: No - copy requested   Objective:   Today's Vitals   12/11/19 1408  BP: 120/74  Pulse: 82  Temp: (!) 97 F (36.1 C)  SpO2: 96%  Weight: 147 lb 12.8 oz (67 kg)   Body mass index is 24.98 kg/m. Wt Readings from Last 3 Encounters:  12/11/19 147 lb 12.8 oz (67 kg)  09/10/19 153 lb 3.2 oz (69.5 kg)  06/04/19 149 lb 12.8 oz (67.9 kg)    General appearance: alert, no distress, WD/WN,  female HEENT: normocephalic, sclerae anicteric, TMs pearly, nares patent, no discharge or erythema, pharynx normal. R upper external nontender nodular lump without discharge Oral cavity: MMM, no lesions Neck: supple, no lymphadenopathy, no thyromegaly, + nodules on thyroid left side more than right, no masses Heart: RRR, normal S1, S2, no murmurs Lungs: CTA bilaterally, no wheezes,  rhonchi, or rales Abdomen: +bs, soft, non tender, non distended, no masses, no hepatomegaly, no splenomegaly Musculoskeletal: nontender, no swelling, no obvious deformity Extremities: no edema, no cyanosis, no clubbing Pulses: 2+ symmetric, upper and lower extremities, normal cap refill Neurological: alert, oriented x 3, CN2-12 intact, strength normal upper extremities and lower extremities, sensation normal throughout, DTRs 2+ throughout, no cerebellar signs, gait slow Psychiatric: normal affect, behavior normal, pleasant    Medicare Attestation I have personally reviewed: The patient's medical and social history Their use of alcohol, tobacco or illicit drugs Their current medications and supplements The patient's functional ability including ADLs,fall risks, home safety risks, cognitive, and hearing and visual impairment Diet and physical activities Evidence for depression or mood disorders  The patient's weight, height, BMI, and visual acuity have  been recorded in the chart.  I have made referrals, counseling, and provided education to the patient based on review of the above and I have provided the patient with a written personalized care plan for preventive services.     Izora Ribas, NP   12/11/2019

## 2019-12-11 ENCOUNTER — Encounter: Payer: Self-pay | Admitting: Adult Health

## 2019-12-11 ENCOUNTER — Ambulatory Visit (INDEPENDENT_AMBULATORY_CARE_PROVIDER_SITE_OTHER): Payer: PPO | Admitting: Adult Health

## 2019-12-11 ENCOUNTER — Other Ambulatory Visit: Payer: Self-pay

## 2019-12-11 VITALS — BP 120/74 | HR 82 | Temp 97.0°F | Wt 147.8 lb

## 2019-12-11 DIAGNOSIS — Z0001 Encounter for general adult medical examination with abnormal findings: Secondary | ICD-10-CM

## 2019-12-11 DIAGNOSIS — M85831 Other specified disorders of bone density and structure, right forearm: Secondary | ICD-10-CM

## 2019-12-11 DIAGNOSIS — F3341 Major depressive disorder, recurrent, in partial remission: Secondary | ICD-10-CM

## 2019-12-11 DIAGNOSIS — M5136 Other intervertebral disc degeneration, lumbar region: Secondary | ICD-10-CM

## 2019-12-11 DIAGNOSIS — K589 Irritable bowel syndrome without diarrhea: Secondary | ICD-10-CM | POA: Diagnosis not present

## 2019-12-11 DIAGNOSIS — Z0000 Encounter for general adult medical examination without abnormal findings: Secondary | ICD-10-CM

## 2019-12-11 DIAGNOSIS — M797 Fibromyalgia: Secondary | ICD-10-CM

## 2019-12-11 DIAGNOSIS — I700 Atherosclerosis of aorta: Secondary | ICD-10-CM

## 2019-12-11 DIAGNOSIS — K219 Gastro-esophageal reflux disease without esophagitis: Secondary | ICD-10-CM

## 2019-12-11 DIAGNOSIS — N182 Chronic kidney disease, stage 2 (mild): Secondary | ICD-10-CM | POA: Diagnosis not present

## 2019-12-11 DIAGNOSIS — C8590 Non-Hodgkin lymphoma, unspecified, unspecified site: Secondary | ICD-10-CM | POA: Diagnosis not present

## 2019-12-11 DIAGNOSIS — E1122 Type 2 diabetes mellitus with diabetic chronic kidney disease: Secondary | ICD-10-CM | POA: Diagnosis not present

## 2019-12-11 DIAGNOSIS — N3281 Overactive bladder: Secondary | ICD-10-CM

## 2019-12-11 DIAGNOSIS — M170 Bilateral primary osteoarthritis of knee: Secondary | ICD-10-CM

## 2019-12-11 DIAGNOSIS — H00011 Hordeolum externum right upper eyelid: Secondary | ICD-10-CM

## 2019-12-11 DIAGNOSIS — G4700 Insomnia, unspecified: Secondary | ICD-10-CM | POA: Diagnosis not present

## 2019-12-11 DIAGNOSIS — I10 Essential (primary) hypertension: Secondary | ICD-10-CM

## 2019-12-11 DIAGNOSIS — E1169 Type 2 diabetes mellitus with other specified complication: Secondary | ICD-10-CM | POA: Diagnosis not present

## 2019-12-11 DIAGNOSIS — E785 Hyperlipidemia, unspecified: Secondary | ICD-10-CM | POA: Diagnosis not present

## 2019-12-11 DIAGNOSIS — R6889 Other general symptoms and signs: Secondary | ICD-10-CM

## 2019-12-11 DIAGNOSIS — Z6824 Body mass index (BMI) 24.0-24.9, adult: Secondary | ICD-10-CM

## 2019-12-11 NOTE — Patient Instructions (Addendum)
Debra Short , Thank you for taking time to come for your Medicare Wellness Visit. I appreciate your ongoing commitment to your health goals. Please review the following plan we discussed and let me know if I can assist you in the future.   These are the goals we discussed: Goals    . Exercise 5x per week (15-20 min per time)       This is a list of the screening recommended for you and due dates:  Health Maintenance  Topic Date Due  . Eye exam for diabetics  10/25/2019  . Flu Shot  01/12/2020  . Hemoglobin A1C  03/12/2020  . Complete foot exam   09/08/2020  . Urine Protein Check  09/09/2020  . Tetanus Vaccine  09/25/2024  . DEXA scan (bone density measurement)  Completed  . COVID-19 Vaccine  Completed  . Pneumonia vaccines  Completed     Try voltaren (diclofenac gel) over the counter topical as needed for hand and foot pain - ok with kidneys/stomach, safe with tylenol   Schedule diabetes eye exam in 2 weeks  Try massaging eye lid with qtip in baby shampoo after doing hot compress 4 times daily - follow up with eye doc in 2 weeks if not resolving    HOW TO SCHEDULE A MAMMOGRAM - [place]  7 a.m.-6:30 p.m., Monday 7 a.m.-5 p.m., Tuesday-Friday Schedule an appointment by calling [phone].      Exercising to Stay Healthy To become healthy and stay healthy, it is recommended that you do moderate-intensity and vigorous-intensity exercise. You can tell that you are exercising at a moderate intensity if your heart starts beating faster and you start breathing faster but can still hold a conversation. You can tell that you are exercising at a vigorous intensity if you are breathing much harder and faster and cannot hold a conversation while exercising. Exercising regularly is important. It has many health benefits, such as:  Improving overall fitness, flexibility, and endurance.  Increasing bone density.  Helping with  weight control.  Decreasing body fat.  Increasing muscle strength.  Reducing stress and tension.  Improving overall health. How often should I exercise? Choose an activity that you enjoy, and set realistic goals. Your health care provider can help you make an activity plan that works for you. Exercise regularly as told by your health care provider. This may include:  Doing strength training two times a week, such as: ? Lifting weights. ? Using resistance bands. ? Push-ups. ? Sit-ups. ? Yoga.  Doing a certain intensity of exercise for a given amount of time. Choose from these options: ? A total of 150 minutes of moderate-intensity exercise every week. ? A total of 75 minutes of vigorous-intensity exercise every week. ? A mix of moderate-intensity and vigorous-intensity exercise every week. Children, pregnant women, people who have not exercised regularly, people who are overweight, and older adults may need to talk with a health care provider about what activities are safe to do. If you have a medical condition, be sure to talk with your health care provider before you start a new exercise program. What are some exercise ideas? Moderate-intensity exercise ideas include:  Walking 1 mile (1.6 km) in about 15 minutes.  Biking.  Hiking.  Golfing.  Dancing.  Water aerobics. Vigorous-intensity exercise ideas include:  Walking 4.5 miles (7.2 km) or more in about 1 hour.  Jogging or running 5 miles (8 km) in about 1  hour.  Biking 10 miles (16.1 km) or more in about 1 hour.  Lap swimming.  Roller-skating or in-line skating.  Cross-country skiing.  Vigorous competitive sports, such as football, basketball, and soccer.  Jumping rope.  Aerobic dancing. What are some everyday activities that can help me to get exercise?  Home work, such as: ? Pushing a Conservation officer, nature. ? Raking and bagging leaves.  Washing your car.  Pushing a stroller.  Shoveling  snow.  Gardening.  Washing windows or floors. How can I be more active in my day-to-day activities?  Use stairs instead of an elevator.  Take a walk during your lunch break.  If you drive, park your car farther away from your work or school.  If you take public transportation, get off one stop early and walk the rest of the way.  Stand up or walk around during all of your indoor phone calls.  Get up, stretch, and walk around every 30 minutes throughout the day.  Enjoy exercise with a friend. Support to continue exercising will help you keep a regular routine of activity. What guidelines can I follow while exercising?  Before you start a new exercise program, talk with your health care provider.  Do not exercise so much that you hurt yourself, feel dizzy, or get very short of breath.  Wear comfortable clothes and wear shoes with good support.  Drink plenty of water while you exercise to prevent dehydration or heat stroke.  Work out until your breathing and your heartbeat get faster. Where to find more information  U.S. Department of Health and Human Services: [employer].at  Centers for Disease Control and Prevention (CDC): http://www.wolf.info/ Summary  Exercising regularly is important. It will improve your overall fitness, flexibility, and endurance.  Regular exercise also will improve your overall health. It can help you control your weight, reduce stress, and improve your bone density.  Do not exercise so much that you hurt yourself, feel dizzy, or get very short of breath.  Before you start a new exercise program, talk with your health care provider. This information is not intended to replace advice given to you by your health care provider. Make sure you discuss any questions you have with your health care provider. Document Revised: 05/12/2017 Document Reviewed: 04/20/2017 Elsevier Patient Education  2020 [employer].

## 2019-12-12 ENCOUNTER — Other Ambulatory Visit: Payer: Self-pay | Admitting: Adult Health

## 2019-12-12 DIAGNOSIS — E1121 Type 2 diabetes mellitus with diabetic nephropathy: Secondary | ICD-10-CM

## 2019-12-12 LAB — HEMOGLOBIN A1C
Hgb A1c MFr Bld: 5.4 % of total Hgb (ref <5.7)
Mean Plasma Glucose: 108 (calc)
eAG (mmol/L): 6 (calc)

## 2019-12-12 LAB — COMPLETE METABOLIC PANEL WITH GFR
AG Ratio: 1.8 (calc) (ref 1.0–2.5)
ALT: 12 U/L (ref 6–29)
AST: 15 U/L (ref 10–35)
Albumin: 4.5 g/dL (ref 3.6–5.1)
Alkaline phosphatase (APISO): 60 U/L (ref 37–153)
BUN: 17 mg/dL (ref 7–25)
CO2: 32 mmol/L (ref 20–32)
Calcium: 10.2 mg/dL (ref 8.6–10.4)
Chloride: 102 mmol/L (ref 98–110)
Creat: 0.79 mg/dL (ref 0.60–0.88)
GFR, Est African American: 81 mL/min/{1.73_m2} (ref >=60)
GFR, Est Non African American: 70 mL/min/{1.73_m2} (ref >=60)
Globulin: 2.5 g/dL (calc) (ref 1.9–3.7)
Glucose, Bld: 102 mg/dL — ABNORMAL HIGH (ref 65–99)
Potassium: 5 mmol/L (ref 3.5–5.3)
Sodium: 142 mmol/L (ref 135–146)
Total Bilirubin: 1 mg/dL (ref 0.2–1.2)
Total Protein: 7 g/dL (ref 6.1–8.1)

## 2019-12-12 LAB — LIPID PANEL
Cholesterol: 147 mg/dL (ref <200)
HDL: 46 mg/dL — ABNORMAL LOW (ref >=50)
LDL Cholesterol (Calc): 77 mg/dL (calc)
Non-HDL Cholesterol (Calc): 101 mg/dL (calc) (ref <130)
Total CHOL/HDL Ratio: 3.2 (calc) (ref <5.0)
Triglycerides: 143 mg/dL (ref <150)

## 2019-12-12 LAB — CBC WITH DIFFERENTIAL/PLATELET
Absolute Monocytes: 550 cells/uL (ref 200–950)
Basophils Absolute: 30 cells/uL (ref 0–200)
Basophils Relative: 0.6 %
Eosinophils Absolute: 170 cells/uL (ref 15–500)
Eosinophils Relative: 3.4 %
HCT: 39.6 % (ref 35.0–45.0)
Hemoglobin: 13 g/dL (ref 11.7–15.5)
Lymphs Abs: 1355 cells/uL (ref 850–3900)
MCH: 31.1 pg (ref 27.0–33.0)
MCHC: 32.8 g/dL (ref 32.0–36.0)
MCV: 94.7 fL (ref 80.0–100.0)
MPV: 9.7 fL (ref 7.5–12.5)
Monocytes Relative: 11 %
Neutro Abs: 2895 cells/uL (ref 1500–7800)
Neutrophils Relative %: 57.9 %
Platelets: 261 10*3/uL (ref 140–400)
RBC: 4.18 10*6/uL (ref 3.80–5.10)
RDW: 12.3 % (ref 11.0–15.0)
Total Lymphocyte: 27.1 %
WBC: 5 10*3/uL (ref 3.8–10.8)

## 2019-12-12 LAB — MAGNESIUM: Magnesium: 1.9 mg/dL (ref 1.5–2.5)

## 2019-12-12 LAB — TSH: TSH: 1.49 mIU/L (ref 0.40–4.50)

## 2019-12-12 MED ORDER — METFORMIN HCL ER 500 MG PO TB24: ORAL_TABLET | ORAL | 1 refills | Status: DC

## 2020-02-11 ENCOUNTER — Other Ambulatory Visit: Payer: Self-pay | Admitting: Internal Medicine

## 2020-02-25 ENCOUNTER — Other Ambulatory Visit: Payer: Self-pay | Admitting: Internal Medicine

## 2020-02-29 ENCOUNTER — Encounter: Payer: Self-pay | Admitting: Internal Medicine

## 2020-02-29 ENCOUNTER — Other Ambulatory Visit: Payer: Self-pay | Admitting: Internal Medicine

## 2020-02-29 DIAGNOSIS — F411 Generalized anxiety disorder: Secondary | ICD-10-CM

## 2020-02-29 DIAGNOSIS — R450 Nervousness: Secondary | ICD-10-CM | POA: Insufficient documentation

## 2020-03-12 ENCOUNTER — Other Ambulatory Visit: Payer: Self-pay

## 2020-03-12 ENCOUNTER — Ambulatory Visit (INDEPENDENT_AMBULATORY_CARE_PROVIDER_SITE_OTHER): Payer: PPO | Admitting: Internal Medicine

## 2020-03-12 VITALS — BP 122/80 | HR 82 | Temp 97.8°F | Resp 16 | Ht 64.5 in | Wt 154.4 lb

## 2020-03-12 DIAGNOSIS — E1169 Type 2 diabetes mellitus with other specified complication: Secondary | ICD-10-CM | POA: Diagnosis not present

## 2020-03-12 DIAGNOSIS — E1122 Type 2 diabetes mellitus with diabetic chronic kidney disease: Secondary | ICD-10-CM

## 2020-03-12 DIAGNOSIS — I10 Essential (primary) hypertension: Secondary | ICD-10-CM

## 2020-03-12 DIAGNOSIS — E559 Vitamin D deficiency, unspecified: Secondary | ICD-10-CM | POA: Diagnosis not present

## 2020-03-12 DIAGNOSIS — E785 Hyperlipidemia, unspecified: Secondary | ICD-10-CM | POA: Diagnosis not present

## 2020-03-12 DIAGNOSIS — N182 Chronic kidney disease, stage 2 (mild): Secondary | ICD-10-CM

## 2020-03-12 DIAGNOSIS — Z79899 Other long term (current) drug therapy: Secondary | ICD-10-CM

## 2020-03-12 NOTE — Patient Instructions (Signed)

## 2020-03-12 NOTE — Progress Notes (Signed)
History of Present Illness:       This very nice 79 y.o. WWF presents for 6 month follow up with HTN, HLD, Pre-Diabetes and Vitamin D Deficiency.  Patient's GERD is controlled with diet & her meds.        Patient is followed expectantly for labile  HTN & BP has been controlled at home. Today's BP is at goal -  122/80. Patient has had no complaints of any cardiac type chest pain, palpitations, dyspnea / orthopnea / PND, dizziness, claudication, or dependent edema.      Hyperlipidemia is controlled with diet & Atorvastatin. Patient denies myalgias or other med SE's. Last Lipids were at goal:  Lab Results  Component Value Date   CHOL 147 12/11/2019   HDL 46 (L) 12/11/2019   LDLCALC 77 12/11/2019   TRIG 143 12/11/2019   CHOLHDL 3.2 12/11/2019    Also, the patient has history of T2_NIDDM /CKD2 and has had no symptoms of reactive hypoglycemia, diabetic polys, paresthesias or visual blurring.  Last A1c was Normal & at goal:  Lab Results  Component Value Date   HGBA1C 5.4 12/11/2019       Further, the patient also has history of Vitamin D Deficiency and supplements vitamin D without any suspected side-effects. Last vitamin D was at goal:  Lab Results  Component Value Date   VD25OH 64 09/10/2019    Current Outpatient Medications on File Prior to Visit  Medication Sig  . ALPRAZolam  1 MG tablet Take 1/2 - 1 tablet 2 - 3 x /day ONLY if needed   . VITAMIN C Take 1 tablet  daily.  Marland Kitchen aspirin EC 81 MG tablet Take daily.  Marland Kitchen atorvastatin 80 MG tablet TAKE    1/3   TABLET DAILY   . NEXIUM Takes 2 caps /day   . FLUoxetine 40 MG capsule TAKE 1 CAPSULE DAILY FOR MOOD  . fLONASE nasal spray 2 SPRAYS INTO EACH NOSTRIL EVERY DAY  . gabapentin  300 MG capsule Take 300 mg by mouth daily.  . metFORMIN-XR 500 MG  Take 1 tablet 2 x /day with Meals for Diabetes  . montelukast  10 MG tablet TAKE 1 TABLET DAILY FOR ALLERGIES  . OCUVITE Take  2 times a day.  . VESICARE 10 MG tablet Take 1  tablet Daily for Bladder Control  . VITAMIN D  Take 5,000 Units daily.  Marland Kitchen zinc  50 MG tablet Take  daily.    Allergies  Allergen Reactions  . Effexor [Venlafaxine] Other (See Comments)    Not effective  . Nortriptyline Other (See Comments)    Not effective  . Paxil [Paroxetine Hcl] Other (See Comments)    Not effective  . Robaxin [Methocarbamol] Itching  . Tizanidine Itching  . Zoloft [Sertraline Hcl] Other (See Comments)    Not effective    PMHx:   Past Medical History:  Diagnosis Date  . Allergy   . Depression with anxiety 10/06/2014  . Diabetes mellitus without complication ([place])   . Fibromyalgia   . GERD (gastroesophageal reflux disease)   . History of migraine 08/25/2016  . Hyperlipidemia   . Hypertension   . IBS (irritable bowel syndrome)   . 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 Vitamin D deficiency     Immunization History  Administered Date(s) Administered  . Influenza Split 04/10/2013, 03/23/2016  . Influenza, High Dose Seasonal PF 03/24/2014, 02/27/2015  . Influenza-Unspecified 03/27/2018  . Moderna SARS-COVID-2 Vaccination 07/26/2019,  08/23/2019  . Pneumococcal Conjugate-13 03/24/2014  . Pneumococcal Polysaccharide-23 06/13/2008  . Td 09/24/2012  . Tdap 09/26/2014  . Zoster 02/23/2011    Past Surgical History:  Procedure Laterality Date  . ABDOMINAL HYSTERECTOMY    . ORIF ANKLE FRACTURE Left 09/26/2014  . ORIF ANKLE FRACTURE Left 09/26/2014   Procedure: OPEN REDUCTION INTERNAL FIXATION (ORIF) ANKLE FRACTURE;  Surgeon: [name], MD;  Location: [facility];  Service: Orthopedics;  Laterality: Left;  . TONSILLECTOMY      FHx:    Reviewed / unchanged  SHx:    Reviewed / unchanged   Systems Review:  Constitutional: Denies fever, chills, wt changes, headaches, insomnia, fatigue, night sweats, change in appetite. Eyes: Denies redness, blurred vision, diplopia, discharge, itchy, watery eyes.  ENT: Denies discharge, congestion, post nasal drip, epistaxis, sore  throat, earache, hearing loss, dental pain, tinnitus, vertigo, sinus pain, snoring.  CV: Denies chest pain, palpitations, irregular heartbeat, syncope, dyspnea, diaphoresis, orthopnea, PND, claudication or edema. Respiratory: denies cough, dyspnea, DOE, pleurisy, hoarseness, laryngitis, wheezing.  Gastrointestinal: Denies dysphagia, odynophagia, heartburn, reflux, water brash, abdominal pain or cramps, nausea, vomiting, bloating, diarrhea, constipation, hematemesis, melena, hematochezia  or hemorrhoids. Genitourinary: Denies dysuria, frequency, urgency, nocturia, hesitancy, discharge, hematuria or flank pain. Musculoskeletal: Denies arthralgias, myalgias, stiffness, jt. swelling, pain, limping or strain/sprain.  Skin: Denies pruritus, rash, hives, warts, acne, eczema or change in skin lesion(s). Neuro: No weakness, tremor, incoordination, spasms, paresthesia or pain. Psychiatric: Denies confusion, memory loss or sensory loss. Endo: Denies change in weight, skin or hair change.  Heme/Lymph: No excessive bleeding, bruising or enlarged lymph nodes.  Physical Exam  BP 122/80   Pulse 82   Temp 97.8 F (36.6 C)   Resp 16   Ht 5' 4.5" (1.638 m)   Wt 154 lb 6.4 oz (70 kg)   SpO2 97%   BMI 26.09 kg/m   Appears  well nourished, well groomed  and in no distress.  Eyes: PERRLA, EOMs, conjunctiva no swelling or erythema. Sinuses: No frontal/maxillary tenderness ENT/Mouth: EAC's clear, TM's nl w/o erythema, bulging. Nares clear w/o erythema, swelling, exudates. Oropharynx clear without erythema or exudates. Oral hygiene is good. Tongue normal, non obstructing. Hearing intact.  Neck: Supple. Thyroid not palpable. Car 2+/2+ without bruits, nodes or JVD. Chest: Respirations nl with BS clear & equal w/o rales, rhonchi, wheezing or stridor.  Cor: Heart sounds normal w/ regular rate and rhythm without sig. murmurs, gallops, clicks or rubs. Peripheral pulses normal and equal  without edema.  Abdomen:  Soft & bowel sounds normal. Non-tender w/o guarding, rebound, hernias, masses or organomegaly.  Lymphatics: Unremarkable.  Musculoskeletal: Full ROM all peripheral extremities, joint stability, 5/5 strength and normal gait.  Skin: Warm, dry without exposed rashes, lesions or ecchymosis apparent.  Neuro: Cranial nerves intact, reflexes equal bilaterally. Sensory-motor testing grossly intact. Tendon reflexes grossly intact.  Pysch: Alert & oriented x 3.  Insight and judgement nl & appropriate. No ideations.  Assessment and Plan:  1. Essential hypertension  - Continue medication, monitor blood pressure at home.  - Continue DASH diet.  Reminder to go to the ER if any CP,  SOB, nausea, dizziness, severe HA, changes vision/speech.  - CBC with Differential/Platelet - COMPLETE METABOLIC PANEL WITH GFR - Magnesium - TSH  2. Hyperlipidemia associated with type 2 diabetes mellitus ([place])  - Continue diet/meds, exercise,& lifestyle modifications.  - Continue monitor periodic cholesterol/liver & renal functions   - Lipid panel  3. Type 2 diabetes mellitus with stage 2 chronic  kidney  disease, without long-term current use of insulin ([facility])  - Continue diet, exercise  - Lifestyle modifications.  - Monitor appropriate labs.  - Hemoglobin A1c - Insulin, random  4. Vitamin D deficiency  - Continue supplementation.  - VITAMIN D 25 Hydroxy  5. Medication management  - CBC with Differential/Platelet - COMPLETE METABOLIC PANEL WITH GFR - Magnesium - Lipid panel - TSH - Hemoglobin A1c - Insulin, random - VITAMIN D 25 Hydroxy         Discussed  regular exercise, BP monitoring, weight control to achieve/maintain BMI less than 25 and discussed med and SE's. Recommended labs to assess and monitor clinical status with further disposition pending results of labs.  I discussed the assessment and treatment plan with the patient. The patient was provided an opportunity to ask questions and all  were answered. The patient agreed with the plan and demonstrated an understanding of the instructions.  I provided over 30 minutes of exam, counseling, chart review and  complex critical decision making.   Kirtland Bouchard, MD

## 2020-03-13 ENCOUNTER — Encounter: Payer: Self-pay | Admitting: Internal Medicine

## 2020-03-13 LAB — LIPID PANEL
Cholesterol: 133 mg/dL (ref <200)
HDL: 51 mg/dL (ref >=50)
LDL Cholesterol (Calc): 60 mg/dL (calc)
Non-HDL Cholesterol (Calc): 82 mg/dL (calc) (ref <130)
Total CHOL/HDL Ratio: 2.6 (calc) (ref <5.0)
Triglycerides: 137 mg/dL (ref <150)

## 2020-03-13 LAB — COMPLETE METABOLIC PANEL WITH GFR
AG Ratio: 1.9 (calc) (ref 1.0–2.5)
ALT: 14 U/L (ref 6–29)
AST: 17 U/L (ref 10–35)
Albumin: 4.5 g/dL (ref 3.6–5.1)
Alkaline phosphatase (APISO): 64 U/L (ref 37–153)
BUN/Creatinine Ratio: 16 (calc) (ref 6–22)
BUN: 16 mg/dL (ref 7–25)
CO2: 31 mmol/L (ref 20–32)
Calcium: 10.1 mg/dL (ref 8.6–10.4)
Chloride: 99 mmol/L (ref 98–110)
Creat: 1.01 mg/dL — ABNORMAL HIGH (ref 0.60–0.88)
GFR, Est African American: 60 mL/min/{1.73_m2} (ref >=60)
GFR, Est Non African American: 52 mL/min/{1.73_m2} — ABNORMAL LOW (ref >=60)
Globulin: 2.4 g/dL (calc) (ref 1.9–3.7)
Glucose, Bld: 90 mg/dL (ref 65–99)
Potassium: 4.8 mmol/L (ref 3.5–5.3)
Sodium: 139 mmol/L (ref 135–146)
Total Bilirubin: 1.2 mg/dL (ref 0.2–1.2)
Total Protein: 6.9 g/dL (ref 6.1–8.1)

## 2020-03-13 LAB — CBC WITH DIFFERENTIAL/PLATELET
Absolute Monocytes: 608 cells/uL (ref 200–950)
Basophils Absolute: 38 cells/uL (ref 0–200)
Basophils Relative: 0.6 %
Eosinophils Absolute: 141 cells/uL (ref 15–500)
Eosinophils Relative: 2.2 %
HCT: 38.8 % (ref 35.0–45.0)
Hemoglobin: 12.6 g/dL (ref 11.7–15.5)
Lymphs Abs: 1517 cells/uL (ref 850–3900)
MCH: 30.7 pg (ref 27.0–33.0)
MCHC: 32.5 g/dL (ref 32.0–36.0)
MCV: 94.4 fL (ref 80.0–100.0)
MPV: 9.7 fL (ref 7.5–12.5)
Monocytes Relative: 9.5 %
Neutro Abs: 4096 cells/uL (ref 1500–7800)
Neutrophils Relative %: 64 %
Platelets: 319 10*3/uL (ref 140–400)
RBC: 4.11 10*6/uL (ref 3.80–5.10)
RDW: 12.3 % (ref 11.0–15.0)
Total Lymphocyte: 23.7 %
WBC: 6.4 10*3/uL (ref 3.8–10.8)

## 2020-03-13 LAB — HEMOGLOBIN A1C
Hgb A1c MFr Bld: 5.7 % of total Hgb — ABNORMAL HIGH (ref <5.7)
Mean Plasma Glucose: 117 (calc)
eAG (mmol/L): 6.5 (calc)

## 2020-03-13 LAB — TSH: TSH: 1.86 mIU/L (ref 0.40–4.50)

## 2020-03-13 LAB — MAGNESIUM: Magnesium: 1.8 mg/dL (ref 1.5–2.5)

## 2020-03-13 LAB — VITAMIN D 25 HYDROXY (VIT D DEFICIENCY, FRACTURES): Vit D, 25-Hydroxy: 70 ng/mL (ref 30–100)

## 2020-03-13 LAB — INSULIN, RANDOM: Insulin: 12.1 u[IU]/mL

## 2020-03-13 MED ORDER — ESOMEPRAZOLE MAGNESIUM 40 MG PO CPDR: DELAYED_RELEASE_CAPSULE | ORAL | 33 refills | Status: DC

## 2020-04-18 ENCOUNTER — Other Ambulatory Visit: Payer: Self-pay | Admitting: Internal Medicine

## 2020-05-14 NOTE — Progress Notes (Signed)
Office Visit Note  Patient: Debra Short             Date of Birth: 05-18-1941           MRN: [id number]             PCP: Unk [name], MD Referring: Unk [name], MD Visit Date: 05/27/2020 Occupation: @GUAROCC @  Subjective:  Generalized pain.   History of Present Illness: Debra Short is a 79 y.o. female history of fibromyalgia, osteoarthritis and degenerative disc disease. She returns today after her last visit in September 2018. She states her fibromyalgia is flaring and she has been experiencing pain all over. She describes pain and discomfort in her right arm and her right lower extremity. She continues to have discomfort in her hands from underlying osteoarthritis. She has difficulty making a fist and her grip has decreased. She has been experiencing increased discomfort in her right knee joint. She states she used VQ orthroCare brace in the past which was helpful . She also tried ankle joint braces but could not wear them as she had history of fracture of the left ankle in the past. Her lower back is not bothering her as much currently. Had some intentional weight loss and she is pleased about it.  Activities of Daily Living:  Patient reports morning stiffness for 24 hours.   Patient Reports nocturnal pain.  Difficulty dressing/grooming: Reports Difficulty climbing stairs: Reports Difficulty getting out of chair: Reports Difficulty using hands for taps, buttons, cutlery, and/or writing: Reports  Review of Systems  Constitutional: Positive for fatigue.  HENT: Negative for mouth sores, mouth dryness and nose dryness.   Eyes: Positive for visual disturbance. Negative for pain, itching and dryness.  Respiratory: Negative for cough and hemoptysis.   Cardiovascular: Negative for chest pain, palpitations and swelling in legs/feet.  Gastrointestinal: Positive for constipation and diarrhea. Negative for abdominal pain and blood in stool.  Endocrine: Negative for  increased urination.  Genitourinary: Negative for painful urination.  Musculoskeletal: Positive for arthralgias, joint pain, myalgias, muscle weakness, morning stiffness, muscle tenderness and myalgias. Negative for joint swelling.  Skin: Negative for color change, rash and redness.  Allergic/Immunologic: Negative for susceptible to infections.  Neurological: Positive for dizziness, numbness, headaches, memory loss and weakness.  Hematological: Negative for swollen glands.  Psychiatric/Behavioral: Positive for confusion and sleep disturbance.    PMFS History:  Patient Active Problem List   Diagnosis Date Noted  . Nervous 02/29/2020  . Aortic atherosclerosis ([place]) 12/11/2019  . Osteopenia 11/26/2018  . OAB (overactive bladder) 05/14/2018  . BMI 24.0-24.9, adult 05/09/2018  . Depression, major, recurrent, in partial remission ([place]) 04/25/2017  . Degenerative disc disease, lumbar 07/20/2016  . Primary osteoarthritis of both knees 07/20/2016  . Fibromyalgia syndrome 07/20/2016  . CKD stage 2 due to type 2 diabetes mellitus ([place]) 10/04/2015  . Insomnia 10/06/2014  . Medication management 10/09/2013  . Hyperlipidemia associated with type 2 diabetes mellitus ([place])   . Essential hypertension   . Gastroesophageal reflux disease   . Vitamin D deficiency   . IBS (irritable bowel syndrome)   . Type 2 diabetes mellitus with stage 2 chronic kidney disease, without long-term current use of insulin ([place])   . Non Hodgkin's lymphoma ([place])     Past Medical History:  Diagnosis Date  . Allergy   . Depression with anxiety 10/06/2014  . Diabetes mellitus without complication ([place])   . Fibromyalgia   . GERD (gastroesophageal reflux disease)   . History of  migraine 08/25/2016  . Hyperlipidemia   . Hypertension   . IBS (irritable bowel syndrome)   . 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 Vitamin D deficiency     Family History  Problem Relation Age of Onset  . Cancer Mother   . Cancer Father   . Cancer  Brother   . Cancer Maternal Grandfather    Past Surgical History:  Procedure Laterality Date  . ABDOMINAL HYSTERECTOMY    . ORIF ANKLE FRACTURE Left 09/26/2014  . ORIF ANKLE FRACTURE Left 09/26/2014   Procedure: OPEN REDUCTION INTERNAL FIXATION (ORIF) ANKLE FRACTURE;  Surgeon: [name], MD;  Location: [facility];  Service: Orthopedics;  Laterality: Left;  . TONSILLECTOMY     Social History   Social History Narrative  . Not on file   Immunization History  Administered Date(s) Administered  . Influenza Split 04/10/2013, 03/23/2016  . Influenza, High Dose Seasonal PF 03/24/2014, 02/27/2015  . Influenza-Unspecified 03/27/2018  . Moderna Sars-Covid-2 Vaccination 07/26/2019, 08/23/2019, 04/21/2020  . Pneumococcal Conjugate-13 03/24/2014  . Pneumococcal Polysaccharide-23 06/13/2008  . Td 09/24/2012  . Tdap 09/26/2014  . Zoster 02/23/2011     Objective: Vital Signs: BP 127/76 (BP Location: Left Arm, Patient Position: Sitting, Cuff Size: Normal)   Pulse 86   Ht 5' 0.75" (1.543 m)   Wt 148 lb 12.8 oz (67.5 kg)   BMI 28.35 kg/m    Physical Exam Vitals and nursing note reviewed.  Constitutional:      Appearance: She is well-developed and well-nourished.  HENT:     Head: Normocephalic and atraumatic.  Eyes:     Extraocular Movements: EOM normal.     Conjunctiva/sclera: Conjunctivae normal.  Cardiovascular:     Rate and Rhythm: Normal rate and regular rhythm.     Pulses: Intact distal pulses.     Heart sounds: Normal heart sounds.  Pulmonary:     Effort: Pulmonary effort is normal.     Breath sounds: Normal breath sounds.  Abdominal:     General: Bowel sounds are normal.     Palpations: Abdomen is soft.  Musculoskeletal:     Cervical back: Normal range of motion.  Lymphadenopathy:     Cervical: No cervical adenopathy.  Skin:    General: Skin is warm and dry.     Capillary Refill: Capillary refill takes less than 2 seconds.  Neurological:     Mental Status: She is alert  and oriented to person, place, and time.  Psychiatric:        Mood and Affect: Mood and affect normal.        Behavior: Behavior normal.      Musculoskeletal Exam: She has limited range of motion of her cervical spine.  Right shoulder abduction was limited to about 30 degrees.  Left shoulder joint was about 120 degrees.  She has discomfort range of motion of her right shoulder joint.  Elbow joints with good range of motion.  She has incomplete fist formation.  She has bilateral PIP and DIP thickening with no synovitis.  Hip joints with good range of motion.  She has limited extension of her right knee joint without any warmth swelling or effusion.  There is no tenderness over ankles or MTPs.  Left ankle joint is thickened due to previous fracture.  CDAI Exam: CDAI Score: - Patient Global: -; Provider Global: - Swollen: -; Tender: - Joint Exam 05/27/2020   No joint exam has been documented for this visit   There is currently no information  documented on the homunculus. Go to the Rheumatology activity and complete the homunculus joint exam.  Investigation: No additional findings.  Imaging: XR KNEE 3 VIEW RIGHT  Result Date: 05/27/2020 Severe medial compartment narrowing with intercondylar and lateral osteophytes was noted.  Severe patellofemoral narrowing was noted.  No chondrocalcinosis was noted. Impression: These findings are consistent with severe osteoarthritis and severe chondromalacia patella.  XR Shoulder Right  Result Date: 05/27/2020 Severe glenohumeral joint space narrowing with inferior spurring was noted.  Acromioclavicular narrowing was noted.  No chondrocalcinosis was noted. Impression: These findings are consistent with severe glenohumeral joint arthritis and acromioclavicular arthritis.   Recent Labs: Lab Results  Component Value Date   WBC 6.4 03/12/2020   HGB 12.6 03/12/2020   PLT 319 03/12/2020   NA 139 03/12/2020   K 4.8 03/12/2020   CL 99 03/12/2020    CO2 31 03/12/2020   GLUCOSE 90 03/12/2020   BUN 16 03/12/2020   CREATININE 1.01 (H) 03/12/2020   BILITOT 1.2 03/12/2020   ALKPHOS 40 03/29/2017   AST 17 03/12/2020   ALT 14 03/12/2020   PROT 6.9 03/12/2020   ALBUMIN 2.9 (L) 03/29/2017   CALCIUM 10.1 03/12/2020   GFRAA 60 03/12/2020    Speciality Comments: No specialty comments available.  Procedures:  Large Joint Inj: R glenohumeral on 05/27/2020 3:57 PM Indications: pain Details: 27 G 1.5 in needle, posterior approach  Arthrogram: No  Medications: 40 mg triamcinolone acetonide 40 MG/ML; 1.5 mL lidocaine 1 % Aspirate: 0 mL Outcome: tolerated well, no immediate complications Procedure, treatment alternatives, risks and benefits explained, specific risks discussed. Consent was given by the patient. Immediately prior to procedure a time out was called to verify the correct patient, procedure, equipment, support staff and site/side marked as required. Patient was prepped and draped in the usual sterile fashion.     Allergies: Effexor [venlafaxine], Nortriptyline, Paxil [paroxetine hcl], Robaxin [methocarbamol], Tizanidine, and Zoloft [sertraline hcl]   Assessment / Plan:     Visit Diagnoses: Fibromyalgia-she continues to have some generalized pain and discomfort.  She is positive tender points.  Chronic right shoulder pain -she has limited range of motion of her right shoulder joint with discomfort.  Plan: XR Shoulder Right.  She has severe glenohumeral joint space narrowing and acromioclavicular arthritis.  Per patient's request right shoulder joint was injected with cortisone as described above.  I have advised her to monitor blood sugar closely as she is diabetic.  She states her blood sugars have been running normal.  I will also make a referral to Dr. [name] for possible right shoulder joint replacement.  Primary osteoarthritis of both hands-she has DIP and PIP thickening and incomplete fist formation consistent with  osteoarthritis.  Chronic pain of right knee -she has been having increased pain and discomfort in her right knee.  No warmth swelling or effusion was noted.  Plan: XR KNEE 3 VIEW RIGHT.  X-ray was consistent with severe osteoarthritis and severe chondromalacia patella.  We will consider cortisone injection at the follow-up visit and she may benefit from Visco supplement injections.  Primary osteoarthritis of both knees - X-ray of her left knee revealed mild osteoarthritis and moderate chondromalacia patella  Instability of joints of both ankles-she has tried ankle brace but could not wear it due to discomfort.  DDD (degenerative disc disease), lumbar-she is currently not having much discomfort in her lower back.  Other chronic pain-she states that she had to come off hydrocodone.  She continues to have chronic pain  and discomfort.  History of depression  History of diabetes mellitus  History of hypercholesterolemia  History of migraine  History of non-Hodgkin's lymphoma  History of hearing loss - left ear  Osteopenia of multiple sites - Nov 08, 2017 T-scoreLeft Forearm Radius 33% 11/08/2017 79.1 -1.7 0.736 g/cm2. She takes calcium and vitamin D. Repeat DEXA is pending.  Orders: Orders Placed This Encounter  Procedures  . Large Joint Inj: R glenohumeral  . XR Shoulder Right  . XR KNEE 3 VIEW RIGHT  . Ambulatory referral to Orthopedic Surgery   No orders of the defined types were placed in this encounter.   Follow-Up Instructions: Return in about 3 months (around 08/25/2020) for Osteoarthritis, DDD, FMS.   Bo Merino, MD  Note - This record has been created using Editor, commissioning.  Chart creation errors have been sought, but may not always  have been located. Such creation errors do not reflect on  the standard of medical care.

## 2020-05-27 ENCOUNTER — Ambulatory Visit (INDEPENDENT_AMBULATORY_CARE_PROVIDER_SITE_OTHER): Payer: PPO | Admitting: Rheumatology

## 2020-05-27 ENCOUNTER — Encounter: Payer: Self-pay | Admitting: Rheumatology

## 2020-05-27 ENCOUNTER — Ambulatory Visit (INDEPENDENT_AMBULATORY_CARE_PROVIDER_SITE_OTHER): Payer: PPO

## 2020-05-27 ENCOUNTER — Other Ambulatory Visit: Payer: Self-pay

## 2020-05-27 VITALS — BP 127/76 | HR 86 | Ht 60.75 in | Wt 148.8 lb

## 2020-05-27 DIAGNOSIS — M25511 Pain in right shoulder: Secondary | ICD-10-CM

## 2020-05-27 DIAGNOSIS — M797 Fibromyalgia: Secondary | ICD-10-CM

## 2020-05-27 DIAGNOSIS — M19042 Primary osteoarthritis, left hand: Secondary | ICD-10-CM

## 2020-05-27 DIAGNOSIS — M25561 Pain in right knee: Secondary | ICD-10-CM

## 2020-05-27 DIAGNOSIS — M8589 Other specified disorders of bone density and structure, multiple sites: Secondary | ICD-10-CM | POA: Diagnosis not present

## 2020-05-27 DIAGNOSIS — M5136 Other intervertebral disc degeneration, lumbar region: Secondary | ICD-10-CM | POA: Diagnosis not present

## 2020-05-27 DIAGNOSIS — Z8639 Personal history of other endocrine, nutritional and metabolic disease: Secondary | ICD-10-CM | POA: Diagnosis not present

## 2020-05-27 DIAGNOSIS — M170 Bilateral primary osteoarthritis of knee: Secondary | ICD-10-CM

## 2020-05-27 DIAGNOSIS — Z8659 Personal history of other mental and behavioral disorders: Secondary | ICD-10-CM

## 2020-05-27 DIAGNOSIS — M25371 Other instability, right ankle: Secondary | ICD-10-CM

## 2020-05-27 DIAGNOSIS — M19041 Primary osteoarthritis, right hand: Secondary | ICD-10-CM | POA: Diagnosis not present

## 2020-05-27 DIAGNOSIS — Z8669 Personal history of other diseases of the nervous system and sense organs: Secondary | ICD-10-CM | POA: Diagnosis not present

## 2020-05-27 DIAGNOSIS — G8929 Other chronic pain: Secondary | ICD-10-CM | POA: Diagnosis not present

## 2020-05-27 DIAGNOSIS — Z8572 Personal history of non-Hodgkin lymphomas: Secondary | ICD-10-CM

## 2020-05-27 DIAGNOSIS — M25372 Other instability, left ankle: Secondary | ICD-10-CM

## 2020-05-27 NOTE — Patient Instructions (Signed)
Journal for Nurse Practitioners, 15(4), 263-267. Retrieved March 19, 2018 from [email or web address].com/[email or web address]">  Knee Exercises Ask your health care provider which exercises are safe for you. Do exercises exactly as told by your health care provider and adjust them as directed. It is normal to feel mild stretching, pulling, tightness, or discomfort as you do these exercises. Stop right away if you feel sudden pain or your pain gets worse. Do not begin these exercises until told by your health care provider. Stretching and range-of-motion exercises These exercises warm up your muscles and joints and improve the movement and flexibility of your knee. These exercises also help to relieve pain and swelling. Knee extension, prone 1. Lie on your abdomen (prone position) on a bed. 2. Place your left / right knee just beyond the edge of the surface so your knee is not on the bed. You can put a towel under your left / right thigh just above your kneecap for comfort. 3. Relax your leg muscles and allow gravity to straighten your knee (extension). You should feel a stretch behind your left / right knee. 4. Hold this position for __________ seconds. 5. Scoot up so your knee is supported between repetitions. Repeat __________ times. Complete this exercise __________ times a day. Knee flexion, active  1. Lie on your back with both legs straight. If this causes back discomfort, bend your left / right knee so your foot is flat on the floor. 2. Slowly slide your left / right heel back toward your buttocks. Stop when you feel a gentle stretch in the front of your knee or thigh (flexion). 3. Hold this position for __________ seconds. 4. Slowly slide your left / right heel back to the starting position. Repeat __________ times. Complete this exercise __________ times a day. Quadriceps stretch, prone  1. Lie on your abdomen on a firm surface, such as a bed or padded floor. 2. Bend your left / right knee and hold  your ankle. If you cannot reach your ankle or pant leg, loop a belt around your foot and grab the belt instead. 3. Gently pull your heel toward your buttocks. Your knee should not slide out to the side. You should feel a stretch in the front of your thigh and knee (quadriceps). 4. Hold this position for __________ seconds. Repeat __________ times. Complete this exercise __________ times a day. Hamstring, supine 1. Lie on your back (supine position). 2. Loop a belt or towel over the ball of your left / right foot. The ball of your foot is on the walking surface, right under your toes. 3. Straighten your left / right knee and slowly pull on the belt to raise your leg until you feel a gentle stretch behind your knee (hamstring). ? Do not let your knee bend while you do this. ? Keep your other leg flat on the floor. 4. Hold this position for __________ seconds. Repeat __________ times. Complete this exercise __________ times a day. Strengthening exercises These exercises build strength and endurance in your knee. Endurance is the ability to use your muscles for a long time, even after they get tired. Quadriceps, isometric This exercise stretches the muscles in front of your thigh (quadriceps) without moving your knee joint (isometric). 1. Lie on your back with your left / right leg extended and your other knee bent. Put a rolled towel or small pillow under your knee if told by your health care provider. 2. Slowly tense the muscles in the front of your left /   right thigh. You should see your kneecap slide up toward your hip or see increased dimpling just above the knee. This motion will push the back of the knee toward the floor. 3. For __________ seconds, hold the muscle as tight as you can without increasing your pain. 4. Relax the muscles slowly and completely. Repeat __________ times. Complete this exercise __________ times a day. Straight leg raises This exercise stretches the muscles in front  of your thigh (quadriceps) and the muscles that move your hips (hip flexors). 1. Lie on your back with your left / right leg extended and your other knee bent. 2. Tense the muscles in the front of your left / right thigh. You should see your kneecap slide up or see increased dimpling just above the knee. Your thigh may even shake a bit. 3. Keep these muscles tight as you raise your leg 4-6 inches (10-15 cm) off the floor. Do not let your knee bend. 4. Hold this position for __________ seconds. 5. Keep these muscles tense as you lower your leg. 6. Relax your muscles slowly and completely after each repetition. Repeat __________ times. Complete this exercise __________ times a day. Hamstring, isometric 1. Lie on your back on a firm surface. 2. Bend your left / right knee about __________ degrees. 3. Dig your left / right heel into the surface as if you are trying to pull it toward your buttocks. Tighten the muscles in the back of your thighs (hamstring) to "dig" as hard as you can without increasing any pain. 4. Hold this position for __________ seconds. 5. Release the tension gradually and allow your muscles to relax completely for __________ seconds after each repetition. Repeat __________ times. Complete this exercise __________ times a day. Hamstring curls If told by your health care provider, do this exercise while wearing ankle weights. Begin with __________ lb weights. Then increase the weight by 1 lb (0.5 kg) increments. Do not wear ankle weights that are more than __________ lb. 1. Lie on your abdomen with your legs straight. 2. Bend your left / right knee as far as you can without feeling pain. Keep your hips flat against the floor. 3. Hold this position for __________ seconds. 4. Slowly lower your leg to the starting position. Repeat __________ times. Complete this exercise __________ times a day. Squats This exercise strengthens the muscles in front of your thigh and knee  (quadriceps). 1. Stand in front of a table, with your feet and knees pointing straight ahead. You may rest your hands on the table for balance but not for support. 2. Slowly bend your knees and lower your hips like you are going to sit in a chair. ? Keep your weight over your heels, not over your toes. ? Keep your lower legs upright so they are parallel with the table legs. ? Do not let your hips go lower than your knees. ? Do not bend lower than told by your health care provider. ? If your knee pain increases, do not bend as low. 3. Hold the squat position for __________ seconds. 4. Slowly push with your legs to return to standing. Do not use your hands to pull yourself to standing. Repeat __________ times. Complete this exercise __________ times a day. Wall slides This exercise strengthens the muscles in front of your thigh and knee (quadriceps). 1. Lean your back against a smooth wall or door, and walk your feet out 18-24 inches (46-61 cm) from it. 2. Place your feet hip-width apart. 3.   Slowly slide down the wall or door until your knees bend __________ degrees. Keep your knees over your heels, not over your toes. Keep your knees in line with your hips. 4. Hold this position for __________ seconds. Repeat __________ times. Complete this exercise __________ times a day. Straight leg raises This exercise strengthens the muscles that rotate the leg at the hip and move it away from your body (hip abductors). 1. Lie on your side with your left / right leg in the top position. Lie so your head, shoulder, knee, and hip line up. You may bend your bottom knee to help you keep your balance. 2. Roll your hips slightly forward so your hips are stacked directly over each other and your left / right knee is facing forward. 3. Leading with your heel, lift your top leg 4-6 inches (10-15 cm). You should feel the muscles in your outer hip lifting. ? Do not let your foot drift forward. ? Do not let your knee  roll toward the ceiling. 4. Hold this position for __________ seconds. 5. Slowly return your leg to the starting position. 6. Let your muscles relax completely after each repetition. Repeat __________ times. Complete this exercise __________ times a day. Straight leg raises This exercise stretches the muscles that move your hips away from the front of the pelvis (hip extensors). 1. Lie on your abdomen on a firm surface. You can put a pillow under your hips if that is more comfortable. 2. Tense the muscles in your buttocks and lift your left / right leg about 4-6 inches (10-15 cm). Keep your knee straight as you lift your leg. 3. Hold this position for __________ seconds. 4. Slowly lower your leg to the starting position. 5. Let your leg relax completely after each repetition. Repeat __________ times. Complete this exercise __________ times a day. This information is not intended to replace advice given to you by your health care provider. Make sure you discuss any questions you have with your health care provider. Document Revised: 03/20/2018 Document Reviewed: 03/20/2018 Elsevier Patient Education  [place]. Shoulder Exercises Ask your health care provider which exercises are safe for you. Do exercises exactly as told by your health care provider and adjust them as directed. It is normal to feel mild stretching, pulling, tightness, or discomfort as you do these exercises. Stop right away if you feel sudden pain or your pain gets worse. Do not begin these exercises until told by your health care provider. Stretching exercises External rotation and abduction This exercise is sometimes called corner stretch. This exercise rotates your arm outward (external rotation) and moves your arm out from your body (abduction). 6. Stand in a doorway with one of your feet slightly in front of the other. This is called a staggered stance. If you cannot reach your forearms to the door frame, stand  facing a corner of a room. 7. Choose one of the following positions as told by your health care provider: ? Place your hands and forearms on the door frame above your head. ? Place your hands and forearms on the door frame at the height of your head. ? Place your hands on the door frame at the height of your elbows. 8. Slowly move your weight onto your front foot until you feel a stretch across your chest and in the front of your shoulders. Keep your head and chest upright and keep your abdominal muscles tight. 9. Hold for __________ seconds. 10. To release the stretch,  shift your weight to your back foot. Repeat __________ times. Complete this exercise __________ times a day. Extension, standing 5. Stand and hold a broomstick, a cane, or a similar object behind your back. ? Your hands should be a little wider than shoulder width apart. ? Your palms should face away from your back. 6. Keeping your elbows straight and your shoulder muscles relaxed, move the stick away from your body until you feel a stretch in your shoulders (extension). ? Avoid shrugging your shoulders while you move the stick. Keep your shoulder blades tucked down toward the middle of your back. 7. Hold for __________ seconds. 8. Slowly return to the starting position. Repeat __________ times. Complete this exercise __________ times a day. Range-of-motion exercises Pendulum  5. Stand near a wall or a surface that you can hold onto for balance. 6. Bend at the waist and let your left / right arm hang straight down. Use your other arm to support you. Keep your back straight and do not lock your knees. 7. Relax your left / right arm and shoulder muscles, and move your hips and your trunk so your left / right arm swings freely. Your arm should swing because of the motion of your body, not because you are using your arm or shoulder muscles. 8. Keep moving your hips and trunk so your arm swings in the following directions, as told  by your health care provider: ? Side to side. ? Forward and backward. ? In clockwise and counterclockwise circles. 9. Continue each motion for __________ seconds, or for as long as told by your health care provider. 10. Slowly return to the starting position. Repeat __________ times. Complete this exercise __________ times a day. Shoulder flexion, standing  5. Stand and hold a broomstick, a cane, or a similar object. Place your hands a little more than shoulder width apart on the object. Your left / right hand should be palm up, and your other hand should be palm down. 6. Keep your elbow straight and your shoulder muscles relaxed. Push the stick up with your healthy arm to raise your left / right arm in front of your body, and then over your head until you feel a stretch in your shoulder (flexion). ? Avoid shrugging your shoulder while you raise your arm. Keep your shoulder blade tucked down toward the middle of your back. 7. Hold for __________ seconds. 8. Slowly return to the starting position. Repeat __________ times. Complete this exercise __________ times a day. Shoulder abduction, standing 5. Stand and hold a broomstick, a cane, or a similar object. Place your hands a little more than shoulder width apart on the object. Your left / right hand should be palm up, and your other hand should be palm down. 6. Keep your elbow straight and your shoulder muscles relaxed. Push the object across your body toward your left / right side. Raise your left / right arm to the side of your body (abduction) until you feel a stretch in your shoulder. ? Do not raise your arm above shoulder height unless your health care provider tells you to do that. ? If directed, raise your arm over your head. ? Avoid shrugging your shoulder while you raise your arm. Keep your shoulder blade tucked down toward the middle of your back. 7. Hold for __________ seconds. 8. Slowly return to the starting position. Repeat  __________ times. Complete this exercise __________ times a day. Internal rotation  7. Place your left / right hand behind  your back, palm up. 8. Use your other hand to dangle an exercise band, a towel, or a similar object over your shoulder. Grasp the band with your left / right hand so you are holding on to both ends. 9. Gently pull up on the band until you feel a stretch in the front of your left / right shoulder. The movement of your arm toward the center of your body is called internal rotation. ? Avoid shrugging your shoulder while you raise your arm. Keep your shoulder blade tucked down toward the middle of your back. 10. Hold for __________ seconds. 11. Release the stretch by letting go of the band and lowering your hands. Repeat __________ times. Complete this exercise __________ times a day. Strengthening exercises External rotation  6. Sit in a stable chair without armrests. 7. Secure an exercise band to a stable object at elbow height on your left / right side. 8. Place a soft object, such as a folded towel or a small pillow, between your left / right upper arm and your body to move your elbow about 4 inches (10 cm) away from your side. 9. Hold the end of the exercise band so it is tight and there is no slack. 10. Keeping your elbow pressed against the soft object, slowly move your forearm out, away from your abdomen (external rotation). Keep your body steady so only your forearm moves. 11. Hold for __________ seconds. 12. Slowly return to the starting position. Repeat __________ times. Complete this exercise __________ times a day. Shoulder abduction  5. Sit in a stable chair without armrests, or stand up. 6. Hold a __________ weight in your left / right hand, or hold an exercise band with both hands. 7. Start with your arms straight down and your left / right palm facing in, toward your body. 8. Slowly lift your left / right hand out to your side (abduction). Do not lift your  hand above shoulder height unless your health care provider tells you that this is safe. ? Keep your arms straight. ? Avoid shrugging your shoulder while you do this movement. Keep your shoulder blade tucked down toward the middle of your back. 9. Hold for __________ seconds. 10. Slowly lower your arm, and return to the starting position. Repeat __________ times. Complete this exercise __________ times a day. Shoulder extension 5. Sit in a stable chair without armrests, or stand up. 6. Secure an exercise band to a stable object in front of you so it is at shoulder height. 7. Hold one end of the exercise band in each hand. Your palms should face each other. 8. Straighten your elbows and lift your hands up to shoulder height. 9. Step back, away from the secured end of the exercise band, until the band is tight and there is no slack. 10. Squeeze your shoulder blades together as you pull your hands down to the sides of your thighs (extension). Stop when your hands are straight down by your sides. Do not let your hands go behind your body. 11. Hold for __________ seconds. 12. Slowly return to the starting position. Repeat __________ times. Complete this exercise __________ times a day. Shoulder row 5. Sit in a stable chair without armrests, or stand up. 6. Secure an exercise band to a stable object in front of you so it is at waist height. 7. Hold one end of the exercise band in each hand. Position your palms so that your thumbs are facing the ceiling (neutral position). 8.  Bend each of your elbows to a 90-degree angle (right angle) and keep your upper arms at your sides. 9. Step back until the band is tight and there is no slack. 10. Slowly pull your elbows back behind you. 11. Hold for __________ seconds. 12. Slowly return to the starting position. Repeat __________ times. Complete this exercise __________ times a day. Shoulder press-ups  7. Sit in a stable chair that has armrests. Sit  upright, with your feet flat on the floor. 8. Put your hands on the armrests so your elbows are bent and your fingers are pointing forward. Your hands should be about even with the sides of your body. 9. Push down on the armrests and use your arms to lift yourself off the chair. Straighten your elbows and lift yourself up as much as you comfortably can. ? Move your shoulder blades down, and avoid letting your shoulders move up toward your ears. ? Keep your feet on the ground. As you get stronger, your feet should support less of your body weight as you lift yourself up. 10. Hold for __________ seconds. 11. Slowly lower yourself back into the chair. Repeat __________ times. Complete this exercise __________ times a day. Wall push-ups  6. Stand so you are facing a stable wall. Your feet should be about one arm-length away from the wall. 7. Lean forward and place your palms on the wall at shoulder height. 8. Keep your feet flat on the floor as you bend your elbows and lean forward toward the wall. 9. Hold for __________ seconds. 10. Straighten your elbows to push yourself back to the starting position. Repeat __________ times. Complete this exercise __________ times a day. This information is not intended to replace advice given to you by your health care provider. Make sure you discuss any questions you have with your health care provider. Document Revised: 09/21/2018 Document Reviewed: 06/29/2018 Elsevier Patient Education  [place].

## 2020-05-28 MED ORDER — TRIAMCINOLONE ACETONIDE 40 MG/ML IJ SUSP
40.0000 mg | INTRAMUSCULAR | Status: AC | PRN
  Administered 2020-05-27: 40 mg via INTRA_ARTICULAR

## 2020-05-28 MED ORDER — LIDOCAINE HCL 1 % IJ SOLN
1.5000 mL | INTRAMUSCULAR | Status: AC | PRN
  Administered 2020-05-27: 1.5 mL

## 2020-06-17 ENCOUNTER — Other Ambulatory Visit: Payer: Self-pay | Admitting: Internal Medicine

## 2020-06-17 ENCOUNTER — Other Ambulatory Visit: Payer: Self-pay | Admitting: Adult Health Nurse Practitioner

## 2020-06-17 DIAGNOSIS — F411 Generalized anxiety disorder: Secondary | ICD-10-CM

## 2020-06-17 DIAGNOSIS — J301 Allergic rhinitis due to pollen: Secondary | ICD-10-CM

## 2020-07-17 NOTE — Progress Notes (Deleted)
Office Visit Note  Patient: Debra Short             Date of Birth: [birth year]           MRN: [id number]             PCP: Unk [name], MD Referring: Unk [name], MD Visit Date: 07/30/2020 Occupation: @GUAROCC @  Subjective:  No chief complaint on file.   History of Present Illness: Debra Short is a 80 y.o. female ***   Activities of Daily Living:  Patient reports morning stiffness for *** {minute/hour:19697}.   Patient {ACTIONS;DENIES/REPORTS:21021675::"Denies"} nocturnal pain.  Difficulty dressing/grooming: {ACTIONS;DENIES/REPORTS:21021675::"Denies"} Difficulty climbing stairs: {ACTIONS;DENIES/REPORTS:21021675::"Denies"} Difficulty getting out of chair: {ACTIONS;DENIES/REPORTS:21021675::"Denies"} Difficulty using hands for taps, buttons, cutlery, and/or writing: {ACTIONS;DENIES/REPORTS:21021675::"Denies"}  No Rheumatology ROS completed.   PMFS History:  Patient Active Problem List   Diagnosis Date Noted  . Nervous 02/29/2020  . Aortic atherosclerosis ([place]) 12/11/2019  . Osteopenia 11/26/2018  . OAB (overactive bladder) 05/14/2018  . BMI 24.0-24.9, adult 05/09/2018  . Depression, major, recurrent, in partial remission ([place]) 04/25/2017  . Degenerative disc disease, lumbar 07/20/2016  . Primary osteoarthritis of both knees 07/20/2016  . Fibromyalgia syndrome 07/20/2016  . CKD stage 2 due to type 2 diabetes mellitus ([place]) 10/04/2015  . Insomnia 10/06/2014  . Medication management 10/09/2013  . Hyperlipidemia associated with type 2 diabetes mellitus ([place])   . Essential hypertension   . Gastroesophageal reflux disease   . Vitamin D deficiency   . IBS (irritable bowel syndrome)   . Type 2 diabetes mellitus with stage 2 chronic kidney disease, without long-term current use of insulin ([place])   . Non Hodgkin's lymphoma ([place])     Past Medical History:  Diagnosis Date  . Allergy   . Depression with anxiety 10/06/2014  . Diabetes mellitus without complication  ([place])   . Fibromyalgia   . GERD (gastroesophageal reflux disease)   . History of migraine 08/25/2016  . Hyperlipidemia   . Hypertension   . IBS (irritable bowel syndrome)   . 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 Vitamin D deficiency     Family History  Problem Relation Age of Onset  . Cancer Mother   . Cancer Father   . Cancer Brother   . Cancer Maternal Grandfather    Past Surgical History:  Procedure Laterality Date  . ABDOMINAL HYSTERECTOMY    . ORIF ANKLE FRACTURE Left 09/26/2014  . ORIF ANKLE FRACTURE Left 09/26/2014   Procedure: OPEN REDUCTION INTERNAL FIXATION (ORIF) ANKLE FRACTURE;  Surgeon: [name], MD;  Location: [facility];  Service: Orthopedics;  Laterality: Left;  . TONSILLECTOMY     Social History   Social History Narrative  . Not on file   Immunization History  Administered Date(s) Administered  . Influenza Split 04/10/2013, 03/23/2016  . Influenza, High Dose Seasonal PF 03/24/2014, 02/27/2015  . Influenza-Unspecified 03/27/2018  . Moderna Sars-Covid-2 Vaccination 07/26/2019, 08/23/2019, 04/21/2020  . Pneumococcal Conjugate-13 03/24/2014  . Pneumococcal Polysaccharide-23 06/13/2008  . Td 09/24/2012  . Tdap 09/26/2014  . Zoster 02/23/2011     Objective: Vital Signs: There were no vitals taken for this visit.   Physical Exam   Musculoskeletal Exam: ***  CDAI Exam: CDAI Score: - Patient Global: -; Provider Global: - Swollen: -; Tender: - Joint Exam 07/30/2020   No joint exam has been documented for this visit   There is currently no information documented on the homunculus. Go to the Rheumatology activity and complete the homunculus joint exam.  Investigation: No additional findings.  Imaging: No results found.  Recent Labs: Lab Results  Component Value Date   WBC 6.4 03/12/2020   HGB 12.6 03/12/2020   PLT 319 03/12/2020   NA 139 03/12/2020   K 4.8 03/12/2020   CL 99 03/12/2020   CO2 31 03/12/2020   GLUCOSE 90 03/12/2020   BUN 16  03/12/2020   CREATININE 1.01 (H) 03/12/2020   BILITOT 1.2 03/12/2020   ALKPHOS 40 03/29/2017   AST 17 03/12/2020   ALT 14 03/12/2020   PROT 6.9 03/12/2020   ALBUMIN 2.9 (L) 03/29/2017   CALCIUM 10.1 03/12/2020   GFRAA 60 03/12/2020    Speciality Comments: No specialty comments available.  Procedures:  No procedures performed Allergies: Effexor [venlafaxine], Nortriptyline, Paxil [paroxetine hcl], Robaxin [methocarbamol], Tizanidine, and Zoloft [sertraline hcl]   Assessment / Plan:     Visit Diagnoses: No diagnosis found.  Orders: No orders of the defined types were placed in this encounter.  No orders of the defined types were placed in this encounter.   Face-to-face time spent with patient was *** minutes. Greater than 50% of time was spent in counseling and coordination of care.  Follow-Up Instructions: No follow-ups on file.   Earnestine Mealing, CMA  Note - This record has been created using Editor, commissioning.  Chart creation errors have been sought, but may not always  have been located. Such creation errors do not reflect on  the standard of medical care.

## 2020-07-20 ENCOUNTER — Other Ambulatory Visit: Payer: Self-pay | Admitting: Internal Medicine

## 2020-07-20 MED ORDER — GABAPENTIN 300 MG PO CAPS: ORAL_CAPSULE | ORAL | 0 refills | Status: DC

## 2020-07-22 ENCOUNTER — Other Ambulatory Visit: Payer: Self-pay | Admitting: Internal Medicine

## 2020-07-30 ENCOUNTER — Ambulatory Visit: Payer: Medicare Other | Admitting: Rheumatology

## 2020-07-30 DIAGNOSIS — G8929 Other chronic pain: Secondary | ICD-10-CM

## 2020-07-30 DIAGNOSIS — Z8659 Personal history of other mental and behavioral disorders: Secondary | ICD-10-CM

## 2020-07-30 DIAGNOSIS — M797 Fibromyalgia: Secondary | ICD-10-CM

## 2020-07-30 DIAGNOSIS — M19042 Primary osteoarthritis, left hand: Secondary | ICD-10-CM

## 2020-07-30 DIAGNOSIS — Z8639 Personal history of other endocrine, nutritional and metabolic disease: Secondary | ICD-10-CM

## 2020-07-30 DIAGNOSIS — M25372 Other instability, left ankle: Secondary | ICD-10-CM

## 2020-07-30 DIAGNOSIS — Z8572 Personal history of non-Hodgkin lymphomas: Secondary | ICD-10-CM

## 2020-07-30 DIAGNOSIS — Z8669 Personal history of other diseases of the nervous system and sense organs: Secondary | ICD-10-CM

## 2020-07-30 DIAGNOSIS — M170 Bilateral primary osteoarthritis of knee: Secondary | ICD-10-CM

## 2020-07-30 DIAGNOSIS — M5136 Other intervertebral disc degeneration, lumbar region: Secondary | ICD-10-CM

## 2020-07-30 DIAGNOSIS — M8589 Other specified disorders of bone density and structure, multiple sites: Secondary | ICD-10-CM

## 2020-08-11 DIAGNOSIS — H353131 Nonexudative age-related macular degeneration, bilateral, early dry stage: Secondary | ICD-10-CM | POA: Diagnosis not present

## 2020-08-11 DIAGNOSIS — H532 Diplopia: Secondary | ICD-10-CM | POA: Diagnosis not present

## 2020-08-11 DIAGNOSIS — H5203 Hypermetropia, bilateral: Secondary | ICD-10-CM | POA: Diagnosis not present

## 2020-08-11 LAB — HM DIABETES EYE EXAM

## 2020-08-25 ENCOUNTER — Encounter: Payer: Self-pay | Admitting: *Deleted

## 2020-08-28 DIAGNOSIS — H04123 Dry eye syndrome of bilateral lacrimal glands: Secondary | ICD-10-CM | POA: Diagnosis not present

## 2020-09-12 ENCOUNTER — Other Ambulatory Visit: Payer: Self-pay | Admitting: Internal Medicine

## 2020-09-12 DIAGNOSIS — E1121 Type 2 diabetes mellitus with diabetic nephropathy: Secondary | ICD-10-CM

## 2020-09-14 ENCOUNTER — Other Ambulatory Visit: Payer: Self-pay | Admitting: Internal Medicine

## 2020-09-15 ENCOUNTER — Encounter: Payer: Self-pay | Admitting: Internal Medicine

## 2020-09-15 NOTE — Patient Instructions (Signed)

## 2020-09-15 NOTE — Progress Notes (Signed)
Annual Screening/Preventative Visit & Comprehensive Evaluation &  Examination   Future Appointments  Date Time Provider [place]  09/16/2020  2:00 PM Unk [name], MD [facility] None  09/17/2021  9:00 AM Unk [name], MD [facility] None        This very nice 80 y.o. WWF presents for a Screening /Preventative Visit & comprehensive evaluation and management of multiple medical co-morbidities.  Patient has been followed for HTN, HLD, T2_NIDDM and Vitamin D Deficiency. Patient's GERD is controlled with her diet /Nexium.       HTN predates circa 2012. Patient's BP has been controlled at home and patient denies any cardiac symptoms as chest pain, palpitations, shortness of breath, dizziness or ankle swelling. Today's BP is at goal - 122/76.       Patient's hyperlipidemia is controlled with diet and medications. Patient denies myalgias or other medication SE's. 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 She denies reactive hypoglycemic symptoms, visual blurring, diabetic polys or paresthesias. Last A1c was near goal:  Lab Results  Component Value Date   HGBA1C 5.7 (H) 03/12/2020       Finally, patient has history of Vitamin D Deficiency ("25" /2009) and last Vitamin D was at goal:  Lab Results  Component Value Date   VD25OH 48 03/12/2020    Current Outpatient Medications on File Prior to Visit  Medication Sig  . ALPRAZolam (XANAX) 1 MG tablet TAKE 1/2 - 1 TABLET 2 - 3 X /DAY ONLY IF NEEDED FOR ANXIETY ATTACK & LIMIT TO 5 DAYS /WEEK TO AVOID ADDICTION & DEMENTIA  . Ascorbic Acid (VITAMIN C PO) Take 1 tablet by mouth daily.  Marland Kitchen aspirin EC 81 MG tablet Take 81 mg by mouth daily.  Marland Kitchen atorvastatin  (LIPITOR) 80 MG tablet TAKE 1/3 TABLET BY MOUTH DAILY FOR CHOLESTEROL. DISCONTINUE PRAVASTATIN  . esomeprazole (NEXIUM) 40 MG capsule Take     1 capsule     Daily     to Prevent Heartburn & Indigestion  . FLUoxetine (PROZAC) 40 MG capsule TAKE 1 CAPSULE DAILY FOR MOOD  . fluticasone (FLONASE) 50 MCG/ACT nasal spray Place 1-2 sprays into both nostrils daily as needed for allergies or rhinitis.  Marland Kitchen gabapentin (NEURONTIN) 300 MG capsule Take  2 capsules  2 x /day  for Pain  . metFORMIN (GLUCOPHAGE-XR) 500 MG 24 hr tablet TAKE 2 TABLETS BY MOUTH 2 X /DAY WITH MEALS FOR DIABETES  . montelukast (SINGULAIR) 10 MG tablet5,000 Units TAKE 1 TABLET BY MOUTH DAILY FOR ALLERGIES  . Multiple Vitamins-Minerals (OCUVITE PO) Take by mouth 2 (two) times a day.  . solifenacin (VESICARE) 10 MG tablet TAKE 1 TABLET  ONCE DAILY FOR BLADDER  . VITAMIN D PO Take   daily.  Marland Kitchen zinc gluconate 50 MG tablet Take 50 mg by mouth daily.    Allergies  Allergen Reactions  . Effexor [Venlafaxine] Not effective  . Nortriptyline Not effective  . Paxil [Paroxetine Hcl] Not effective  . Robaxin [Methocarbamol] Itching  . Tizanidine Itching  . Zoloft [Sertraline Hcl] Other     Past Medical History:  Diagnosis Date  . Allergy   . Depression with  anxiety 10/06/2014  . Diabetes mellitus without complication ([place])   . Fibromyalgia   . GERD (gastroesophageal reflux disease)   . History of migraine 08/25/2016  . Hyperlipidemia   . Hypertension   . IBS (irritable bowel syndrome)   . 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 Vitamin D deficiency     Health Maintenance  Topic Date Due  . FOOT EXAM  09/08/2020  . HEMOGLOBIN A1C  09/09/2020  . URINE MICROALBUMIN  09/09/2020  . COVID-19 Vaccine (4 - Booster for Moderna series) 10/19/2020  . INFLUENZA VACCINE  01/11/2021  . OPHTHALMOLOGY EXAM  08/11/2021  . TETANUS/TDAP  09/25/2024  . DEXA SCAN  Completed  . PNA vac Low Risk Adult  Completed  . HPV VACCINES  Aged Out    Immunization  History  Administered Date(s) Administered  . Influenza Split 04/10/2013, 03/23/2016  . Influenza, High Dose Seasonal PF 03/24/2014, 02/27/2015  . Influenza-Unspecified 03/27/2018  . Moderna Sars-Covid-2 Vaccination 07/26/2019, 08/23/2019, 04/21/2020  . Pneumococcal Conjugate-13 03/24/2014  . Pneumococcal Polysaccharide-23 06/13/2008  . Td 09/24/2012  . Tdap 09/26/2014  . Zoster 02/23/2011    Last Colon - 08/08/2011 Colon - Arty Baumgartner - recc repeat 5 years (2018) - overdue  & aged out  Last MGM - 11/08/2017  Past Surgical History:  Procedure Laterality Date  . ABDOMINAL HYSTERECTOMY    . ORIF ANKLE FRACTURE Left 09/26/2014  . ORIF ANKLE FRACTURE Left 09/26/2014   Procedure: OPEN REDUCTION INTERNAL FIXATION (ORIF) ANKLE FRACTURE;  Surgeon: [name], MD;  Location: [facility];  Service: Orthopedics;  Laterality: Left;  . TONSILLECTOMY      Family History  Problem Relation Age of Onset  . Cancer Mother   . Cancer Father   . Cancer Brother   . Cancer Maternal Grandfather     Social History   Tobacco Use  . Smoking status: Never Smoker  . Smokeless tobacco: Never Used  Vaping Use  . Vaping Use: Never used  Substance Use Topics  . Alcohol use: No  . Drug use: No    ROS Constitutional: Denies fever, chills, weight loss/gain, headaches, insomnia,  night sweats, and change in appetite. Does c/o fatigue. Eyes: Denies redness, blurred vision, diplopia, discharge, itchy, watery eyes.  ENT: Denies discharge, congestion, post nasal drip, epistaxis, sore throat, earache, hearing loss, dental pain, Tinnitus, Vertigo, Sinus pain, snoring.  Cardio: Denies chest pain, palpitations, irregular heartbeat, syncope, dyspnea, diaphoresis, orthopnea, PND, claudication, edema Respiratory: denies cough, dyspnea, DOE, pleurisy, hoarseness, laryngitis, wheezing.  Gastrointestinal: Denies dysphagia, heartburn, reflux, water brash, pain, cramps, nausea, vomiting, bloating, diarrhea, constipation,  hematemesis, melena, hematochezia, jaundice, hemorrhoids Genitourinary: Denies dysuria, frequency, urgency, nocturia, hesitancy, discharge, hematuria, flank pain Breast: Breast lumps, nipple discharge, bleeding.  Musculoskeletal: Denies arthralgia, myalgia, stiffness, Jt. Swelling, pain, limp, and strain/sprain. Denies falls. Skin: Denies puritis, rash, hives, warts, acne, eczema, changing in skin lesion Neuro: No weakness, tremor, incoordination, spasms, paresthesia, pain Psychiatric: Denies confusion, memory loss, sensory loss. Denies Depression. Endocrine: Denies change in weight, skin, hair change, nocturia, and paresthesia, diabetic polys, visual blurring, hyper / hypo glycemic episodes.  Heme/Lymph: No excessive bleeding, bruising, enlarged lymph nodes.  Physical Exam  BP 122/76   Pulse 82   Temp 97.7 F (36.5 C)   Resp 16   Ht 5' 4.5" (1.638 m)   Wt 151 lb 6.4 oz (68.7 kg)   SpO2 95%   BMI 25.59 kg/m   General Appearance: Well nourished, well groomed and in no apparent distress.  Eyes: PERRLA, EOMs, conjunctiva no swelling or erythema, normal fundi and vessels. Sinuses: No frontal/maxillary tenderness ENT/Mouth: EACs patent / TMs  nl. Nares clear without erythema, swelling, mucoid exudates. Oral hygiene is good. No erythema, swelling, or exudate. Tongue normal, non-obstructing. Tonsils not swollen or erythematous. Hearing normal.  Neck: Supple, thyroid not palpable. No bruits, nodes or JVD. Respiratory: Respiratory effort normal.  BS equal and clear bilateral without rales, rhonci, wheezing or stridor. Cardio: Heart sounds are normal with regular rate and rhythm and no murmurs, rubs or gallops. Peripheral pulses are normal and equal bilaterally without edema. No aortic or femoral bruits. Chest: symmetric with normal excursions and percussion. Breasts: Symmetric, without lumps, nipple discharge, retractions, or fibrocystic changes.  Abdomen: Flat, soft with bowel sounds active.  Nontender, no guarding, rebound, hernias, masses, or organomegaly.  Lymphatics: Non tender without lymphadenopathy.  Genitourinary:  Musculoskeletal: Full ROM all peripheral extremities, joint stability, 5/5 strength, and normal gait. Skin: Warm and dry without rashes, lesions, cyanosis, clubbing or  ecchymosis.  Neuro: Cranial nerves intact, reflexes equal bilaterally. Normal muscle tone, no cerebellar symptoms. Sensation intact.  Pysch: Alert and oriented X 3, normal affect, Insight and Judgment appropriate.   Assessment and Plan  1. Annual Preventative Screening Examination   2. Essential hypertension  - EKG 12-Lead - Urinalysis, Routine w reflex microscopic - Microalbumin / creatinine urine ratio - CBC with Differential/Platelet - COMPLETE METABOLIC PANEL WITH GFR - Magnesium - TSH  3. Hyperlipidemia associated with type 2 diabetes mellitus ([place])  - EKG 12-Lead - Lipid panel - TSH  4. Type 2 diabetes mellitus with stage 3a chronic kidney  disease, without long-term current use of insulin ([facility])  - EKG 12-Lead - PTH, intact and calcium - Hemoglobin A1c - Insulin, random  5. Vitamin D deficiency  - VITAMIN D 25 Hydroxy  6. Aortic atherosclerosis ([place]) by CXR in 2019  - EKG 12-Lead  7. Gastroesophageal reflux disease  - CBC with Differential/Platelet  8. History of non-Hodgkin's lymphoma   9. Screening for colorectal cancer  - POC Hemoccult Bld/Stl   10. Screening for ischemic heart disease  - EKG 12-Lead  11. Medication management  - Urinalysis, Routine w reflex microscopic - Microalbumin / creatinine urine ratio - CBC with Differential/Platelet - COMPLETE METABOLIC PANEL WITH GFR - Magnesium - Lipid panel - TSH - Hemoglobin A1c - Insulin, random - VITAMIN D 25 Hydroxy         Patient was counseled in prudent diet to achieve/maintain BMI less than 25 for weight control, BP monitoring, regular exercise and medications. Discussed med's effects  and SE's. Screening labs and tests as requested with regular follow-up as recommended. Over 40 minutes of exam, counseling, chart review and high complex critical decision making was performed.   Kirtland Bouchard, MD

## 2020-09-16 ENCOUNTER — Ambulatory Visit (INDEPENDENT_AMBULATORY_CARE_PROVIDER_SITE_OTHER): Payer: PPO | Admitting: Internal Medicine

## 2020-09-16 ENCOUNTER — Other Ambulatory Visit: Payer: Self-pay

## 2020-09-16 VITALS — BP 122/76 | HR 82 | Temp 97.7°F | Resp 16 | Ht 64.5 in | Wt 151.4 lb

## 2020-09-16 DIAGNOSIS — E1122 Type 2 diabetes mellitus with diabetic chronic kidney disease: Secondary | ICD-10-CM

## 2020-09-16 DIAGNOSIS — I700 Atherosclerosis of aorta: Secondary | ICD-10-CM

## 2020-09-16 DIAGNOSIS — N1831 Chronic kidney disease, stage 3a: Secondary | ICD-10-CM | POA: Diagnosis not present

## 2020-09-16 DIAGNOSIS — E785 Hyperlipidemia, unspecified: Secondary | ICD-10-CM | POA: Diagnosis not present

## 2020-09-16 DIAGNOSIS — Z0001 Encounter for general adult medical examination with abnormal findings: Secondary | ICD-10-CM

## 2020-09-16 DIAGNOSIS — Z8572 Personal history of non-Hodgkin lymphomas: Secondary | ICD-10-CM

## 2020-09-16 DIAGNOSIS — E1169 Type 2 diabetes mellitus with other specified complication: Secondary | ICD-10-CM | POA: Diagnosis not present

## 2020-09-16 DIAGNOSIS — Z0000 Encounter for general adult medical examination without abnormal findings: Secondary | ICD-10-CM

## 2020-09-16 DIAGNOSIS — I10 Essential (primary) hypertension: Secondary | ICD-10-CM

## 2020-09-16 DIAGNOSIS — Z136 Encounter for screening for cardiovascular disorders: Secondary | ICD-10-CM

## 2020-09-16 DIAGNOSIS — E559 Vitamin D deficiency, unspecified: Secondary | ICD-10-CM | POA: Diagnosis not present

## 2020-09-16 DIAGNOSIS — K219 Gastro-esophageal reflux disease without esophagitis: Secondary | ICD-10-CM

## 2020-09-16 DIAGNOSIS — Z79899 Other long term (current) drug therapy: Secondary | ICD-10-CM

## 2020-09-16 DIAGNOSIS — Z1211 Encounter for screening for malignant neoplasm of colon: Secondary | ICD-10-CM

## 2020-09-17 LAB — COMPLETE METABOLIC PANEL WITH GFR
AG Ratio: 1.8 (calc) (ref 1.0–2.5)
ALT: 15 U/L (ref 6–29)
AST: 17 U/L (ref 10–35)
Albumin: 4.4 g/dL (ref 3.6–5.1)
Alkaline phosphatase (APISO): 57 U/L (ref 37–153)
BUN: 19 mg/dL (ref 7–25)
CO2: 32 mmol/L (ref 20–32)
Calcium: 9.9 mg/dL (ref 8.6–10.4)
Chloride: 100 mmol/L (ref 98–110)
Creat: 0.77 mg/dL (ref 0.60–0.88)
GFR, Est African American: 84 mL/min/{1.73_m2} (ref >=60)
GFR, Est Non African American: 72 mL/min/{1.73_m2} (ref >=60)
Globulin: 2.5 g/dL (calc) (ref 1.9–3.7)
Glucose, Bld: 88 mg/dL (ref 65–139)
Potassium: 4.6 mmol/L (ref 3.5–5.3)
Sodium: 140 mmol/L (ref 135–146)
Total Bilirubin: 1.3 mg/dL — ABNORMAL HIGH (ref 0.2–1.2)
Total Protein: 6.9 g/dL (ref 6.1–8.1)

## 2020-09-17 LAB — LIPID PANEL
Cholesterol: 135 mg/dL (ref <200)
HDL: 50 mg/dL (ref >=50)
LDL Cholesterol (Calc): 66 mg/dL (calc)
Non-HDL Cholesterol (Calc): 85 mg/dL (calc) (ref <130)
Total CHOL/HDL Ratio: 2.7 (calc) (ref <5.0)
Triglycerides: 102 mg/dL (ref <150)

## 2020-09-17 LAB — URINALYSIS, ROUTINE W REFLEX MICROSCOPIC
Bacteria, UA: NONE SEEN /HPF
Bilirubin Urine: NEGATIVE
Glucose, UA: NEGATIVE
Hgb urine dipstick: NEGATIVE
Ketones, ur: NEGATIVE
Nitrite: NEGATIVE
Specific Gravity, Urine: 1.024 (ref 1.001–1.03)
pH: 7 (ref 5.0–8.0)

## 2020-09-17 LAB — CBC WITH DIFFERENTIAL/PLATELET
Absolute Monocytes: 580 cells/uL (ref 200–950)
Basophils Absolute: 38 cells/uL (ref 0–200)
Basophils Relative: 0.6 %
Eosinophils Absolute: 151 cells/uL (ref 15–500)
Eosinophils Relative: 2.4 %
HCT: 38.2 % (ref 35.0–45.0)
Hemoglobin: 12.3 g/dL (ref 11.7–15.5)
Lymphs Abs: 1373 cells/uL (ref 850–3900)
MCH: 30.4 pg (ref 27.0–33.0)
MCHC: 32.2 g/dL (ref 32.0–36.0)
MCV: 94.6 fL (ref 80.0–100.0)
MPV: 9.9 fL (ref 7.5–12.5)
Monocytes Relative: 9.2 %
Neutro Abs: 4158 cells/uL (ref 1500–7800)
Neutrophils Relative %: 66 %
Platelets: 287 10*3/uL (ref 140–400)
RBC: 4.04 10*6/uL (ref 3.80–5.10)
RDW: 12.1 % (ref 11.0–15.0)
Total Lymphocyte: 21.8 %
WBC: 6.3 10*3/uL (ref 3.8–10.8)

## 2020-09-17 LAB — VITAMIN D 25 HYDROXY (VIT D DEFICIENCY, FRACTURES): Vit D, 25-Hydroxy: 82 ng/mL (ref 30–100)

## 2020-09-17 LAB — HEMOGLOBIN A1C
Hgb A1c MFr Bld: 5.5 % of total Hgb (ref <5.7)
Mean Plasma Glucose: 111 mg/dL
eAG (mmol/L): 6.2 mmol/L

## 2020-09-17 LAB — MAGNESIUM: Magnesium: 1.7 mg/dL (ref 1.5–2.5)

## 2020-09-17 LAB — MICROSCOPIC MESSAGE

## 2020-09-17 LAB — PTH, INTACT AND CALCIUM
Calcium: 9.9 mg/dL (ref 8.6–10.4)
PTH: 42 pg/mL (ref 16–77)

## 2020-09-17 LAB — MICROALBUMIN / CREATININE URINE RATIO
Creatinine, Urine: 148 mg/dL (ref 20–275)
Microalb Creat Ratio: 16 mcg/mg creat (ref <30)
Microalb, Ur: 2.4 mg/dL

## 2020-09-17 LAB — TSH: TSH: 1.94 mIU/L (ref 0.40–4.50)

## 2020-09-17 LAB — INSULIN, RANDOM: Insulin: 11.8 u[IU]/mL

## 2020-09-18 NOTE — Progress Notes (Signed)
============================================================ ============================================================  -    PTH  is a hormone to regulate calcium balance  is   normal  ============================================================ ============================================================  -  Total Chol = 135  Excellent   - Very low risk for Heart Attack  / Stroke ========================================================  -  Magnesium  -  1.7    -  very  low- goal is betw 2.0 - 2.5,   - So..............Marland Kitchen  Recommend that you take  Magnesium 500 mg tablet daily   - also important to eat lots of  leafy green vegetables   - spinach - Kale - collards - greens - okra - asparagus  - broccoli - quinoa - squash - almonds   - black, red, white beans  -  peas - green beans ============================================================ ============================================================  -  A1c - Normal - in Normal Non - Diabetic range - Excellent !  ============================================================ ============================================================  -  Vitamin D = 82 - Excellent  ============================================================ ============================================================  -  All Else - CBC - Kidneys - Electrolytes - Liver - Magnesium & Thyroid    - all  Normal / OK ============================================================ ============================================================

## 2020-09-24 ENCOUNTER — Other Ambulatory Visit: Payer: Self-pay | Admitting: Adult Health

## 2020-09-24 DIAGNOSIS — F411 Generalized anxiety disorder: Secondary | ICD-10-CM

## 2020-09-25 ENCOUNTER — Emergency Department (HOSPITAL_COMMUNITY): Payer: PPO

## 2020-09-25 ENCOUNTER — Other Ambulatory Visit: Payer: Self-pay

## 2020-09-25 ENCOUNTER — Emergency Department (HOSPITAL_COMMUNITY)
Admission: EM | Admit: 2020-09-25 | Discharge: 2020-09-25 | Disposition: A | Discharge status: Home / Self Care | Payer: PPO | Attending: Emergency Medicine | Admitting: Emergency Medicine

## 2020-09-25 ENCOUNTER — Encounter (HOSPITAL_COMMUNITY): Payer: Self-pay

## 2020-09-25 DIAGNOSIS — N182 Chronic kidney disease, stage 2 (mild): Secondary | ICD-10-CM | POA: Insufficient documentation

## 2020-09-25 DIAGNOSIS — S022XXA Fracture of nasal bones, initial encounter for closed fracture: Secondary | ICD-10-CM | POA: Insufficient documentation

## 2020-09-25 DIAGNOSIS — S0181XA Laceration without foreign body of other part of head, initial encounter: Secondary | ICD-10-CM | POA: Diagnosis not present

## 2020-09-25 DIAGNOSIS — Y9389 Activity, other specified: Secondary | ICD-10-CM | POA: Diagnosis not present

## 2020-09-25 DIAGNOSIS — S0992XA Unspecified injury of nose, initial encounter: Secondary | ICD-10-CM | POA: Diagnosis present

## 2020-09-25 DIAGNOSIS — M542 Cervicalgia: Secondary | ICD-10-CM | POA: Insufficient documentation

## 2020-09-25 DIAGNOSIS — E1122 Type 2 diabetes mellitus with diabetic chronic kidney disease: Secondary | ICD-10-CM | POA: Diagnosis not present

## 2020-09-25 DIAGNOSIS — Z7984 Long term (current) use of oral hypoglycemic drugs: Secondary | ICD-10-CM | POA: Insufficient documentation

## 2020-09-25 DIAGNOSIS — Z7982 Long term (current) use of aspirin: Secondary | ICD-10-CM | POA: Insufficient documentation

## 2020-09-25 DIAGNOSIS — S01112A Laceration without foreign body of left eyelid and periocular area, initial encounter: Secondary | ICD-10-CM | POA: Insufficient documentation

## 2020-09-25 DIAGNOSIS — S0083XA Contusion of other part of head, initial encounter: Secondary | ICD-10-CM | POA: Diagnosis not present

## 2020-09-25 DIAGNOSIS — W19XXXA Unspecified fall, initial encounter: Secondary | ICD-10-CM | POA: Diagnosis not present

## 2020-09-25 DIAGNOSIS — W101XXA Fall (on)(from) sidewalk curb, initial encounter: Secondary | ICD-10-CM | POA: Insufficient documentation

## 2020-09-25 DIAGNOSIS — Z23 Encounter for immunization: Secondary | ICD-10-CM | POA: Insufficient documentation

## 2020-09-25 DIAGNOSIS — R58 Hemorrhage, not elsewhere classified: Secondary | ICD-10-CM | POA: Diagnosis not present

## 2020-09-25 DIAGNOSIS — Z043 Encounter for examination and observation following other accident: Secondary | ICD-10-CM | POA: Diagnosis not present

## 2020-09-25 DIAGNOSIS — I10 Essential (primary) hypertension: Secondary | ICD-10-CM | POA: Diagnosis not present

## 2020-09-25 MED ORDER — LIDOCAINE-EPINEPHRINE (PF) 2 %-1:200000 IJ SOLN: 20.0000 mL | Freq: Once | INTRAMUSCULAR | Status: DC

## 2020-09-25 MED ORDER — TETANUS-DIPHTH-ACELL PERTUSSIS 5-2.5-18.5 LF-MCG/0.5 IM SUSY
0.5000 mL | PREFILLED_SYRINGE | Freq: Once | INTRAMUSCULAR | Status: AC
  Administered 2020-09-25: 0.5 mL via INTRAMUSCULAR
  Filled 2020-09-25: qty 0.5

## 2020-09-25 MED ORDER — LIDOCAINE-EPINEPHRINE (PF) 2 %-1:200000 IJ SOLN
10.0000 mL | Freq: Once | INTRAMUSCULAR | Status: AC
  Administered 2020-09-25: 10 mL
  Filled 2020-09-25: qty 20

## 2020-09-25 NOTE — ED Notes (Signed)
PT refusing to put on gown, pts bra removed.

## 2020-09-25 NOTE — ED Triage Notes (Signed)
Pt BIB [facility] after suffering a mechanical fall while picking up her dry cleaning. Pt tripped on the curb. Pt denies any LOC or being on a blood thinner. Pt does have an abrasion to the bridge of the nose and a lac to the forehead on the L side. No other injuries noted. VSS

## 2020-09-25 NOTE — ED Provider Notes (Signed)
[facility] EMERGENCY DEPARTMENT Provider Note   CSN: [id number] Arrival date & time: 09/25/20  1726     History Chief Complaint  Patient presents with  . Fall    Debra Short is a 80 y.o. female.  HPI    80 year old female presents today after fall.  She states she was going into the dry cleaner.  She stubbed her toe and tripped and fell striking her face.  She has a laceration to her forehead and contusion to her nose.  She denies other injuries.  She denies loss of consciousness.  She denies being on any blood thinners.  She states that she has had some recent falls.  Is not clear on the cause of this.  Denies any lateralized weakness Past Medical History:  Diagnosis Date  . Allergy   . Diabetes mellitus without complication ([place])   . Fibromyalgia   . History of migraine 08/25/2016  . Hyperlipidemia   . IBS (irritable bowel syndrome)   . 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 Vitamin D deficiency     Patient Active Problem List   Diagnosis Date Noted  . Nervous 02/29/2020  . Aortic atherosclerosis ([place]) by CXR in 2019 12/11/2019  . Osteopenia 11/26/2018  . OAB (overactive bladder) 05/14/2018  . BMI 24.0-24.9, adult 05/09/2018  . Depression, major, recurrent, in partial remission ([place]) 04/25/2017  . Degenerative disc disease, lumbar 07/20/2016  . Primary osteoarthritis of both knees 07/20/2016  . Fibromyalgia syndrome 07/20/2016  . CKD stage 2 due to type 2 diabetes mellitus ([place]) 10/04/2015  . Insomnia 10/06/2014  . Medication management 10/09/2013  . Hyperlipidemia associated with type 2 diabetes mellitus ([place])   . Essential hypertension   . Gastroesophageal reflux disease   . Vitamin D deficiency   . IBS (irritable bowel syndrome)   . Type 2 diabetes mellitus with stage 2 chronic kidney disease, without long-term current use of insulin ([place])   . Non Hodgkin's lymphoma [facility])     Past Surgical History:  Procedure Laterality Date  . ABDOMINAL  HYSTERECTOMY    . ORIF ANKLE FRACTURE Left 09/26/2014  . ORIF ANKLE FRACTURE Left 09/26/2014   Procedure: OPEN REDUCTION INTERNAL FIXATION (ORIF) ANKLE FRACTURE;  Surgeon: [name], MD;  Location: [facility];  Service: Orthopedics;  Laterality: Left;  . TONSILLECTOMY       OB History   No obstetric history on file.     Family History  Problem Relation Age of Onset  . Cancer Mother   . Cancer Father   . Cancer Brother   . Cancer Maternal Grandfather     Social History   Tobacco Use  . Smoking status: Never Smoker  . Smokeless tobacco: Never Used  Vaping Use  . Vaping Use: Never used  Substance Use Topics  . Alcohol use: No  . Drug use: No    Home Medications Prior to Admission medications   Medication Sig Start Date End Date Taking? Authorizing Provider  acetaminophen (TYLENOL) 650 MG CR tablet Take 650 mg by mouth See admin instructions. Take 650 mg by mouth at bedtime and an additional 650 mg during the day as needed for pain   Yes [provider]  ALPRAZolam (XANAX) 1 MG tablet TAKE 1/2 - 1 TABLET 2 - 3 X /DAY ONLY IF NEEDED FOR ANXIETY ATTACK & LIMIT TO 5 DAYS /WEEK TO AVOID ADDICTION & DEMENTIA Patient taking differently: Take 0.5-1 mg by mouth See admin instructions. Take 0.5-1 mg by  mouth two to to three times a day "only if needed for an anxiety attack and limit to 5 days a week to avoid addiction and dementia" 09/24/20  Yes Liane Comber, NP  aspirin EC 81 MG tablet Take 81 mg by mouth daily with lunch.   Yes [provider]  atorvastatin (LIPITOR) 80 MG tablet TAKE 1/3 TABLET BY MOUTH DAILY FOR CHOLESTEROL. DISCONTINUE PRAVASTATIN Patient taking differently: Take 26.7 mg by mouth daily. 02/25/20  Yes Liane Comber, NP  Cholecalciferol (VITAMIN D-3 PO) Take 1-2 capsules by mouth daily.   Yes [provider]  esomeprazole (NEXIUM) 40 MG capsule Take     1 capsule     Daily     to Prevent Heartburn & Indigestion Patient taking differently:  Take 40 mg by mouth daily as needed (for heartburn or reflux). 03/13/20  Yes Unk [name], MD  FLUoxetine (PROZAC) 40 MG capsule TAKE 1 CAPSULE DAILY FOR MOOD Patient taking differently: Take 40 mg by mouth daily in the afternoon. 02/11/20  Yes Liane Comber, NP  fluticasone (FLONASE) 50 MCG/ACT nasal spray Place 1-2 sprays into both nostrils daily as needed for allergies or rhinitis. 09/14/20  Yes Liane Comber, NP  gabapentin (NEURONTIN) 300 MG capsule Take  2 capsules  2 x /day  for Pain Patient taking differently: Take 300 mg by mouth See admin instructions. Take 300 mg by mouth at bedtime and an additional 300 mg one to three times a day as needed for pain 07/20/20  Yes Unk [name], MD  metFORMIN (GLUCOPHAGE-XR) 500 MG 24 hr tablet TAKE 2 TABLETS BY MOUTH 2 X /DAY WITH MEALS FOR DIABETES Patient taking differently: Take 500 mg by mouth See admin instructions. Take 500 mg by mouth with lunch, dinner, and at bedtime 09/12/20  Yes Corbett, Caryl Pina, NP  montelukast (SINGULAIR) 10 MG tablet TAKE 1 TABLET BY MOUTH DAILY FOR ALLERGIES Patient taking differently: Take 5-10 mg by mouth at bedtime as needed (for allergies). 06/18/20  Yes Liane Comber, NP  Multiple Vitamins-Minerals (ICAPS) CAPS Take 1 capsule by mouth daily with lunch.   Yes [provider]  Multiple Vitamins-Minerals (ONE-A-DAY PROACTIVE 65+) TABS Take 1 tablet by mouth daily with breakfast.   Yes [provider]  solifenacin (VESICARE) 10 MG tablet TAKE 1 TABLET BY MOUTH ONCE DAILY FOR BLADDER Patient taking differently: Take 10 mg by mouth at bedtime. 07/22/20  Yes McClanahan, Danton Sewer, NP  vitamin C (ASCORBIC ACID) 500 MG tablet Take 500 mg by mouth every 7 (seven) days.   Yes [provider]    Allergies    Effexor [venlafaxine], Nortriptyline, Other, Paxil [paroxetine hcl], Robaxin [methocarbamol], Tizanidine, and Zoloft [sertraline hcl]  Review of Systems   Review of Systems  All other systems  reviewed and are negative.   Physical Exam Updated Vital Signs BP (!) 155/80   Pulse 77   Temp 98.3 F (36.8 C) (Oral)   Resp 17   SpO2 96%   Physical Exam Vitals and nursing note reviewed.  Constitutional:      General: She is not in acute distress.    Appearance: Normal appearance.  HENT:     Head: Normocephalic.     Comments: Laceration to left forehead Contusion over nares    Right Ear: Tympanic membrane and external ear normal.     Left Ear: Tympanic membrane and external ear normal.     Nose:     Comments: Nares clear without bleeding and no septal hematoma  Mouth/Throat:     Mouth: Mucous membranes are moist.     Pharynx: Oropharynx is clear.  Eyes:     Extraocular Movements: Extraocular movements intact.     Pupils: Pupils are equal, round, and reactive to light.  Neck:     Comments: Cervical collar in place Mild diffuse tenderness over cervical spine Cardiovascular:     Rate and Rhythm: Normal rate and regular rhythm.     Pulses: Normal pulses.     Heart sounds: Normal heart sounds.  Pulmonary:     Effort: Pulmonary effort is normal.  Abdominal:     General: Abdomen is flat.     Palpations: Abdomen is soft.  Musculoskeletal:        General: No swelling, tenderness or deformity. Normal range of motion.  Skin:    General: Skin is warm and dry.     Capillary Refill: Capillary refill takes less than 2 seconds.  Neurological:     General: No focal deficit present.     Mental Status: She is alert.     Cranial Nerves: No cranial nerve deficit.     Motor: No weakness.     Coordination: Coordination normal.     Deep Tendon Reflexes: Reflexes normal.  Psychiatric:        Mood and Affect: Mood normal.     ED Results / Procedures / Treatments   Labs (all labs ordered are listed, but only abnormal results are displayed) Labs Reviewed - No data to display  EKG None  Radiology CT Head Wo Contrast  Result Date: 09/25/2020 CLINICAL DATA:  [place], left  periorbital trauma EXAM: CT HEAD WITHOUT CONTRAST CT MAXILLOFACIAL WITHOUT CONTRAST CT CERVICAL SPINE WITHOUT CONTRAST TECHNIQUE: Multidetector CT imaging of the head, cervical spine, and maxillofacial structures were performed using the standard protocol without intravenous contrast. Multiplanar CT image reconstructions of the cervical spine and maxillofacial structures were also generated. COMPARISON:  None. FINDINGS: CT HEAD FINDINGS Brain: No acute infarct or hemorrhage. Lateral ventricles and midline structures are unremarkable. No acute extra-axial fluid collections. No mass effect. Vascular: No hyperdense vessel or unexpected calcification. Skull: Normal. Negative for fracture or focal lesion. Other: None. CT MAXILLOFACIAL FINDINGS Osseous: Minimally displaced fracture through the right aspect of the nasal bone. No other acute bony abnormalities. Orbits: Negative. No traumatic or inflammatory finding. Sinuses: Clear. Soft tissues: Prominent soft tissue swelling over the nasal bridge and in the left periorbital region. Laceration in the left supraorbital tissues. CT CERVICAL SPINE FINDINGS Alignment: There is mild anterolisthesis of C3 on C4, C4 on C5, and C5 on C6. Otherwise alignment is anatomic. Skull base and vertebrae: No acute fracture. No primary bone lesion or focal pathologic process. Soft tissues and spinal canal: No prevertebral fluid or swelling. No visible canal hematoma. Disc levels: Extensive multilevel spondylosis most pronounced from C3 through T1. There is diffuse facet hypertrophy. Prominent neural foraminal encroachment is seen bilaterally from C3-4 through C6-7. Upper chest: Airway is patent. Mild subpleural consolidation at the left apex likely scarring. Other: Reconstructed images demonstrate no additional findings. IMPRESSION: 1. No acute intracranial process. 2. Minimally displaced right-sided nasal bone fracture, with soft tissue swelling over the nasal bridge. 3. Left periorbital  soft tissue swelling, with small left supraorbital scalp laceration. 4. No acute cervical spine fracture. 5. Extensive multilevel cervical spondylosis and facet hypertrophy. Electronically Signed   By: Randa Ngo M.D.   On: 09/25/2020 22:17   CT Cervical Spine Wo Contrast  Result Date: 09/25/2020 CLINICAL DATA:  [place], left periorbital trauma EXAM: CT HEAD WITHOUT CONTRAST CT MAXILLOFACIAL WITHOUT CONTRAST CT CERVICAL SPINE WITHOUT CONTRAST TECHNIQUE: Multidetector CT imaging of the head, cervical spine, and maxillofacial structures were performed using the standard protocol without intravenous contrast. Multiplanar CT image reconstructions of the cervical spine and maxillofacial structures were also generated. COMPARISON:  None. FINDINGS: CT HEAD FINDINGS Brain: No acute infarct or hemorrhage. Lateral ventricles and midline structures are unremarkable. No acute extra-axial fluid collections. No mass effect. Vascular: No hyperdense vessel or unexpected calcification. Skull: Normal. Negative for fracture or focal lesion. Other: None. CT MAXILLOFACIAL FINDINGS Osseous: Minimally displaced fracture through the right aspect of the nasal bone. No other acute bony abnormalities. Orbits: Negative. No traumatic or inflammatory finding. Sinuses: Clear. Soft tissues: Prominent soft tissue swelling over the nasal bridge and in the left periorbital region. Laceration in the left supraorbital tissues. CT CERVICAL SPINE FINDINGS Alignment: There is mild anterolisthesis of C3 on C4, C4 on C5, and C5 on C6. Otherwise alignment is anatomic. Skull base and vertebrae: No acute fracture. No primary bone lesion or focal pathologic process. Soft tissues and spinal canal: No prevertebral fluid or swelling. No visible canal hematoma. Disc levels: Extensive multilevel spondylosis most pronounced from C3 through T1. There is diffuse facet hypertrophy. Prominent neural foraminal encroachment is seen bilaterally from C3-4 through C6-7.  Upper chest: Airway is patent. Mild subpleural consolidation at the left apex likely scarring. Other: Reconstructed images demonstrate no additional findings. IMPRESSION: 1. No acute intracranial process. 2. Minimally displaced right-sided nasal bone fracture, with soft tissue swelling over the nasal bridge. 3. Left periorbital soft tissue swelling, with small left supraorbital scalp laceration. 4. No acute cervical spine fracture. 5. Extensive multilevel cervical spondylosis and facet hypertrophy. Electronically Signed   By: Randa Ngo M.D.   On: 09/25/2020 22:17   CT Maxillofacial Wo Contrast  Result Date: 09/25/2020 CLINICAL DATA:  [place], left periorbital trauma EXAM: CT HEAD WITHOUT CONTRAST CT MAXILLOFACIAL WITHOUT CONTRAST CT CERVICAL SPINE WITHOUT CONTRAST TECHNIQUE: Multidetector CT imaging of the head, cervical spine, and maxillofacial structures were performed using the standard protocol without intravenous contrast. Multiplanar CT image reconstructions of the cervical spine and maxillofacial structures were also generated. COMPARISON:  None. FINDINGS: CT HEAD FINDINGS Brain: No acute infarct or hemorrhage. Lateral ventricles and midline structures are unremarkable. No acute extra-axial fluid collections. No mass effect. Vascular: No hyperdense vessel or unexpected calcification. Skull: Normal. Negative for fracture or focal lesion. Other: None. CT MAXILLOFACIAL FINDINGS Osseous: Minimally displaced fracture through the right aspect of the nasal bone. No other acute bony abnormalities. Orbits: Negative. No traumatic or inflammatory finding. Sinuses: Clear. Soft tissues: Prominent soft tissue swelling over the nasal bridge and in the left periorbital region. Laceration in the left supraorbital tissues. CT CERVICAL SPINE FINDINGS Alignment: There is mild anterolisthesis of C3 on C4, C4 on C5, and C5 on C6. Otherwise alignment is anatomic. Skull base and vertebrae: No acute fracture. No primary bone  lesion or focal pathologic process. Soft tissues and spinal canal: No prevertebral fluid or swelling. No visible canal hematoma. Disc levels: Extensive multilevel spondylosis most pronounced from C3 through T1. There is diffuse facet hypertrophy. Prominent neural foraminal encroachment is seen bilaterally from C3-4 through C6-7. Upper chest: Airway is patent. Mild subpleural consolidation at the left apex likely scarring. Other: Reconstructed images demonstrate no additional findings. IMPRESSION: 1. No acute intracranial process. 2. Minimally displaced right-sided nasal bone fracture, with soft tissue swelling over the nasal bridge. 3. Left periorbital  soft tissue swelling, with small left supraorbital scalp laceration. 4. No acute cervical spine fracture. 5. Extensive multilevel cervical spondylosis and facet hypertrophy. Electronically Signed   By: Randa Ngo M.D.   On: 09/25/2020 22:17    Procedures Procedures   Medications Ordered in ED Medications - No data to display  ED Course  I have reviewed the triage vital signs and the nursing notes.  Pertinent labs & imaging results that were available during my care of the patient were reviewed by me and considered in my medical decision making (see chart for details).    9:30 PM Patient hemodynamically stable awaiting CT scan MDM Rules/Calculators/A&P                          80 year old female with reported mechanical fall.  She has a nasal bone fracture on CAT scan but no acute intracranial abnormality, or cervical spine injury. Laceration repair per Montine Circle, PA Discussed findings with patient and will refer for outpatient follow-up for nasal bone fracture. Discussed suture care, wound care, and suture removal. Discussed return precautions and need for follow-up patient voices understanding. Final Clinical Impression(s) / ED Diagnoses Final diagnoses:  Fall, initial encounter  Contusion of face, initial encounter  Laceration  of eyebrow and forehead, left, initial encounter  Closed fracture of nasal bone, initial encounter    Rx / DC Orders ED Discharge Orders    None       Pattricia Boss, MD 09/25/20 2300

## 2020-09-25 NOTE — Discharge Instructions (Addendum)
You should be removed in 7 to 10 days You do have a broken bone in your nose, please call the ENT referred to for follow-up Take Tylenol and ibuprofen as needed for pain Return if you are having new or worsening symptoms

## 2020-09-25 NOTE — ED Provider Notes (Signed)
  [facility] EMERGENCY DEPARTMENT Provider Note      Procedures .Marland KitchenLaceration Repair  Date/Time: 09/25/2020 10:59 PM Performed by: Montine Circle, PA-C Authorized by: Montine Circle, PA-C   Consent:    Consent obtained:  Verbal   Consent given by:  Patient   Risks, benefits, and alternatives were discussed: yes     Risks discussed:  Infection, pain, poor cosmetic result, retained foreign body and poor wound healing   Alternatives discussed:  Referral Universal protocol:    Procedure explained and questions answered to patient or proxy's satisfaction: yes     Relevant documents present and verified: yes     Test results available: yes     Imaging studies available: yes     Required blood products, implants, devices, and special equipment available: yes     Site/side marked: yes     Immediately prior to procedure, a time out was called: yes     Patient identity confirmed:  Verbally with patient Anesthesia:    Anesthesia method:  Local infiltration   Local anesthetic:  Lidocaine 1% WITH epi Laceration details:    Location:  Face   Face location:  Forehead   Length (cm):  2 Pre-procedure details:    Preparation:  Patient was prepped and draped in usual sterile fashion Exploration:    Wound exploration: wound explored through full range of motion and entire depth of wound visualized     Contaminated: no   Treatment:    Area cleansed with:  Saline   Amount of cleaning:  Standard Skin repair:    Repair method:  Sutures   Suture size:  5-0   Wound skin closure material used: vicryl rapide.   Number of sutures:  3 Approximation:    Approximation:  Close Repair type:    Repair type:  Simple Post-procedure details:    Dressing:  Antibiotic ointment and adhesive bandage   Procedure completion:  Tolerated well, no immediate complications       Montine Circle, PA-C 09/25/20 2301    Pattricia Boss, MD 09/27/20 1504

## 2020-09-26 NOTE — ED Notes (Signed)
Called patient's mobile phone, gave verbal instructions of discharge and referral on message.

## 2020-09-29 DIAGNOSIS — H04123 Dry eye syndrome of bilateral lacrimal glands: Secondary | ICD-10-CM | POA: Diagnosis not present

## 2020-10-29 DIAGNOSIS — H532 Diplopia: Secondary | ICD-10-CM | POA: Diagnosis not present

## 2020-11-02 ENCOUNTER — Encounter (HOSPITAL_COMMUNITY): Payer: Self-pay

## 2020-11-02 ENCOUNTER — Emergency Department (HOSPITAL_COMMUNITY): Payer: PPO

## 2020-11-02 ENCOUNTER — Other Ambulatory Visit: Payer: Self-pay

## 2020-11-02 ENCOUNTER — Observation Stay (HOSPITAL_COMMUNITY)
Admission: EM | Admit: 2020-11-02 | Discharge: 2020-11-04 | Disposition: A | Discharge status: Home / Self Care | Payer: PPO | Attending: Internal Medicine | Admitting: Internal Medicine

## 2020-11-02 DIAGNOSIS — S065X9A Traumatic subdural hemorrhage with loss of consciousness of unspecified duration, initial encounter: Secondary | ICD-10-CM | POA: Diagnosis present

## 2020-11-02 DIAGNOSIS — W1839XA Other fall on same level, initial encounter: Secondary | ICD-10-CM | POA: Diagnosis not present

## 2020-11-02 DIAGNOSIS — S065X0A Traumatic subdural hemorrhage without loss of consciousness, initial encounter: Principal | ICD-10-CM | POA: Insufficient documentation

## 2020-11-02 DIAGNOSIS — S065XAA Traumatic subdural hemorrhage with loss of consciousness status unknown, initial encounter: Secondary | ICD-10-CM | POA: Diagnosis present

## 2020-11-02 DIAGNOSIS — I10 Essential (primary) hypertension: Secondary | ICD-10-CM | POA: Diagnosis present

## 2020-11-02 DIAGNOSIS — Z7982 Long term (current) use of aspirin: Secondary | ICD-10-CM | POA: Diagnosis not present

## 2020-11-02 DIAGNOSIS — S0181XA Laceration without foreign body of other part of head, initial encounter: Secondary | ICD-10-CM | POA: Diagnosis not present

## 2020-11-02 DIAGNOSIS — W19XXXA Unspecified fall, initial encounter: Secondary | ICD-10-CM | POA: Diagnosis not present

## 2020-11-02 DIAGNOSIS — I129 Hypertensive chronic kidney disease with stage 1 through stage 4 chronic kidney disease, or unspecified chronic kidney disease: Secondary | ICD-10-CM | POA: Insufficient documentation

## 2020-11-02 DIAGNOSIS — M47812 Spondylosis without myelopathy or radiculopathy, cervical region: Secondary | ICD-10-CM | POA: Diagnosis not present

## 2020-11-02 DIAGNOSIS — Z23 Encounter for immunization: Secondary | ICD-10-CM | POA: Diagnosis not present

## 2020-11-02 DIAGNOSIS — Y92009 Unspecified place in unspecified non-institutional (private) residence as the place of occurrence of the external cause: Secondary | ICD-10-CM | POA: Insufficient documentation

## 2020-11-02 DIAGNOSIS — E119 Type 2 diabetes mellitus without complications: Secondary | ICD-10-CM | POA: Diagnosis present

## 2020-11-02 DIAGNOSIS — E1122 Type 2 diabetes mellitus with diabetic chronic kidney disease: Secondary | ICD-10-CM | POA: Diagnosis present

## 2020-11-02 DIAGNOSIS — Z20822 Contact with and (suspected) exposure to covid-19: Secondary | ICD-10-CM | POA: Diagnosis not present

## 2020-11-02 DIAGNOSIS — Z7984 Long term (current) use of oral hypoglycemic drugs: Secondary | ICD-10-CM | POA: Insufficient documentation

## 2020-11-02 DIAGNOSIS — N182 Chronic kidney disease, stage 2 (mild): Secondary | ICD-10-CM | POA: Diagnosis not present

## 2020-11-02 DIAGNOSIS — S022XXA Fracture of nasal bones, initial encounter for closed fracture: Secondary | ICD-10-CM | POA: Diagnosis not present

## 2020-11-02 DIAGNOSIS — Z79899 Other long term (current) drug therapy: Secondary | ICD-10-CM | POA: Diagnosis not present

## 2020-11-02 DIAGNOSIS — S199XXA Unspecified injury of neck, initial encounter: Secondary | ICD-10-CM | POA: Diagnosis not present

## 2020-11-02 DIAGNOSIS — Z043 Encounter for examination and observation following other accident: Secondary | ICD-10-CM | POA: Diagnosis not present

## 2020-11-02 DIAGNOSIS — R58 Hemorrhage, not elsewhere classified: Secondary | ICD-10-CM | POA: Diagnosis not present

## 2020-11-02 DIAGNOSIS — C8590 Non-Hodgkin lymphoma, unspecified, unspecified site: Secondary | ICD-10-CM | POA: Diagnosis present

## 2020-11-02 DIAGNOSIS — S0990XA Unspecified injury of head, initial encounter: Secondary | ICD-10-CM

## 2020-11-02 HISTORY — DX: Traumatic subdural hemorrhage with loss of consciousness status unknown, initial encounter: S06.5XAA

## 2020-11-02 LAB — BASIC METABOLIC PANEL
Anion gap: 7 (ref 5–15)
BUN: 15 mg/dL (ref 8–23)
CO2: 27 mmol/L (ref 22–32)
Calcium: 9.1 mg/dL (ref 8.9–10.3)
Chloride: 102 mmol/L (ref 98–111)
Creatinine, Ser: 0.87 mg/dL (ref 0.44–1.00)
GFR, Estimated: >60 mL/min (ref >60)
Glucose, Bld: 104 mg/dL — ABNORMAL HIGH (ref 70–99)
Potassium: 4.1 mmol/L (ref 3.5–5.1)
Sodium: 136 mmol/L (ref 135–145)

## 2020-11-02 LAB — CBC WITH DIFFERENTIAL/PLATELET
Abs Immature Granulocytes: 0.06 10*3/uL (ref 0.00–0.07)
Basophils Absolute: 0 10*3/uL (ref 0.0–0.1)
Basophils Relative: 0 %
Eosinophils Absolute: 0.1 10*3/uL (ref 0.0–0.5)
Eosinophils Relative: 1 %
HCT: 36.6 % (ref 36.0–46.0)
Hemoglobin: 11.9 g/dL — ABNORMAL LOW (ref 12.0–15.0)
Immature Granulocytes: 1 %
Lymphocytes Relative: 10 %
Lymphs Abs: 1.2 10*3/uL (ref 0.7–4.0)
MCH: 30.9 pg (ref 26.0–34.0)
MCHC: 32.5 g/dL (ref 30.0–36.0)
MCV: 95.1 fL (ref 80.0–100.0)
Monocytes Absolute: 0.8 10*3/uL (ref 0.1–1.0)
Monocytes Relative: 7 %
Neutro Abs: 9.4 10*3/uL — ABNORMAL HIGH (ref 1.7–7.7)
Neutrophils Relative %: 81 %
Platelets: 269 10*3/uL (ref 150–400)
RBC: 3.85 MIL/uL — ABNORMAL LOW (ref 3.87–5.11)
RDW: 12 % (ref 11.5–15.5)
WBC: 11.6 10*3/uL — ABNORMAL HIGH (ref 4.0–10.5)
nRBC: 0 % (ref 0.0–0.2)

## 2020-11-02 LAB — PROTIME-INR
INR: 1 (ref 0.8–1.2)
Prothrombin Time: 13.4 seconds (ref 11.4–15.2)

## 2020-11-02 LAB — RESP PANEL BY RT-PCR (FLU A&B, COVID) ARPGX2
Influenza A by PCR: NEGATIVE
Influenza B by PCR: NEGATIVE
SARS Coronavirus 2 by RT PCR: NEGATIVE

## 2020-11-02 LAB — TYPE AND SCREEN
ABO/RH(D): A POS
Antibody Screen: NEGATIVE

## 2020-11-02 MED ORDER — ACETAMINOPHEN 500 MG PO TABS
1000.0000 mg | ORAL_TABLET | Freq: Once | ORAL | Status: AC
  Administered 2020-11-02: 1000 mg via ORAL
  Filled 2020-11-02: qty 2

## 2020-11-02 MED ORDER — TETANUS-DIPHTH-ACELL PERTUSSIS 5-2.5-18.5 LF-MCG/0.5 IM SUSY
0.5000 mL | PREFILLED_SYRINGE | Freq: Once | INTRAMUSCULAR | Status: AC
  Administered 2020-11-02: 0.5 mL via INTRAMUSCULAR
  Filled 2020-11-02: qty 0.5

## 2020-11-02 MED ORDER — LIDOCAINE-EPINEPHRINE (PF) 2 %-1:200000 IJ SOLN
10.0000 mL | Freq: Once | INTRAMUSCULAR | Status: AC
  Administered 2020-11-02: 10 mL
  Filled 2020-11-02: qty 20

## 2020-11-02 NOTE — Progress Notes (Signed)
We were cal;led to look at the [facility] on this 80 yo female with frequent falls. The is a tiny extra axial hyperdensity maybe 21mm in the L inferotemporal region. No indication for neurosurgical intervention. If she is admitted would repeat the scan in the am, and let us know if there is any change. Should resolve without consequence and would treat as a mild TBI.

## 2020-11-02 NOTE — ED Provider Notes (Signed)
[facility] EMERGENCY DEPARTMENT Provider Note   CSN: [id number] Arrival date & time: 11/02/20  1929     History Chief Complaint  Patient presents with  . Fall  . Head Injury    Debra Short is a 80 y.o. female.  80 year old female with prior medical history as detailed below presents for evaluation.  Patient reports that she fell at home.  She lives by herself.  She reports a history of frequent falls.  She struck her forehead tonight when she fell.  She does not think that she passed out.  She has 2 significant lacerations to the forehead one above the left eyebrow and one of the hairline of her forehead.  She denies other injury.  She arrives by EMS after a neighbor apparently helped her call for help.  She does take aspirin daily.  She is unsure of her last tetanus.  The history is provided by the patient and medical records.  Fall This is a new problem. The current episode started less than 1 hour ago. The problem occurs every several days. The problem has not changed since onset.Pertinent negatives include no chest pain, no abdominal pain, no headaches and no shortness of breath. Nothing aggravates the symptoms. Nothing relieves the symptoms.  Head Injury Associated symptoms: no headaches        Past Medical History:  Diagnosis Date  . Allergy   . Diabetes mellitus without complication ([place])   . Fibromyalgia   . History of migraine 08/25/2016  . Hyperlipidemia   . IBS (irritable bowel syndrome)   . 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 Vitamin D deficiency     Patient Active Problem List   Diagnosis Date Noted  . Nervous 02/29/2020  . Aortic atherosclerosis ([place]) by CXR in 2019 12/11/2019  . Osteopenia 11/26/2018  . OAB (overactive bladder) 05/14/2018  . BMI 24.0-24.9, adult 05/09/2018  . Depression, major, recurrent, in partial remission ([place]) 04/25/2017  . Degenerative disc disease, lumbar 07/20/2016  . Primary osteoarthritis of both knees  07/20/2016  . Fibromyalgia syndrome 07/20/2016  . CKD stage 2 due to type 2 diabetes mellitus ([place]) 10/04/2015  . Insomnia 10/06/2014  . Medication management 10/09/2013  . Hyperlipidemia associated with type 2 diabetes mellitus ([place])   . Essential hypertension   . Gastroesophageal reflux disease   . Vitamin D deficiency   . IBS (irritable bowel syndrome)   . Type 2 diabetes mellitus with stage 2 chronic kidney disease, without long-term current use of insulin ([place])   . Non Hodgkin's lymphoma [facility])     Past Surgical History:  Procedure Laterality Date  . ABDOMINAL HYSTERECTOMY    . ORIF ANKLE FRACTURE Left 09/26/2014  . ORIF ANKLE FRACTURE Left 09/26/2014   Procedure: OPEN REDUCTION INTERNAL FIXATION (ORIF) ANKLE FRACTURE;  Surgeon: [name], MD;  Location: [facility];  Service: Orthopedics;  Laterality: Left;  . TONSILLECTOMY       OB History   No obstetric history on file.     Family History  Problem Relation Age of Onset  . Cancer Mother   . Cancer Father   . Cancer Brother   . Cancer Maternal Grandfather     Social History   Tobacco Use  . Smoking status: Never Smoker  . Smokeless tobacco: Never Used  Vaping Use  . Vaping Use: Never used  Substance Use Topics  . Alcohol use: No  . Drug use: No    Home Medications Prior to Admission medications  Medication Sig Start Date End Date Taking? Authorizing Provider  acetaminophen (TYLENOL) 650 MG CR tablet Take 650 mg by mouth See admin instructions. Take 650 mg by mouth at bedtime and an additional 650 mg during the day as needed for pain    [provider]  ALPRAZolam (XANAX) 1 MG tablet TAKE 1/2 - 1 TABLET 2 - 3 X /DAY ONLY IF NEEDED FOR ANXIETY ATTACK & LIMIT TO 5 DAYS /WEEK TO AVOID ADDICTION & DEMENTIA Patient taking differently: Take 0.5-1 mg by mouth See admin instructions. Take 0.5-1 mg by mouth two to to three times a day "only if needed for an anxiety attack and limit to 5 days a week to avoid  addiction and dementia" 09/24/20   Liane Comber, NP  aspirin EC 81 MG tablet Take 81 mg by mouth daily with lunch.    [provider]  atorvastatin (LIPITOR) 80 MG tablet TAKE 1/3 TABLET BY MOUTH DAILY FOR CHOLESTEROL. DISCONTINUE PRAVASTATIN Patient taking differently: Take 26.7 mg by mouth daily. 02/25/20   Liane Comber, NP  Cholecalciferol (VITAMIN D-3 PO) Take 1-2 capsules by mouth daily.    [provider]  esomeprazole (NEXIUM) 40 MG capsule Take     1 capsule     Daily     to Prevent Heartburn & Indigestion Patient taking differently: Take 40 mg by mouth daily as needed (for heartburn or reflux). 03/13/20   Unk [name], MD  FLUoxetine (PROZAC) 40 MG capsule TAKE 1 CAPSULE DAILY FOR MOOD Patient taking differently: Take 40 mg by mouth daily in the afternoon. 02/11/20   Liane Comber, NP  fluticasone (FLONASE) 50 MCG/ACT nasal spray Place 1-2 sprays into both nostrils daily as needed for allergies or rhinitis. 09/14/20   Liane Comber, NP  gabapentin (NEURONTIN) 300 MG capsule Take  2 capsules  2 x /day  for Pain Patient taking differently: Take 300 mg by mouth See admin instructions. Take 300 mg by mouth at bedtime and an additional 300 mg one to three times a day as needed for pain 07/20/20   Unk [name], MD  metFORMIN (GLUCOPHAGE-XR) 500 MG 24 hr tablet TAKE 2 TABLETS BY MOUTH 2 X /DAY WITH MEALS FOR DIABETES Patient taking differently: Take 500 mg by mouth See admin instructions. Take 500 mg by mouth with lunch, dinner, and at bedtime 09/12/20   Liane Comber, NP  montelukast (SINGULAIR) 10 MG tablet TAKE 1 TABLET BY MOUTH DAILY FOR ALLERGIES Patient taking differently: Take 5-10 mg by mouth at bedtime as needed (for allergies). 06/18/20   Liane Comber, NP  Multiple Vitamins-Minerals (ICAPS) CAPS Take 1 capsule by mouth daily with lunch.    [provider]  Multiple Vitamins-Minerals (ONE-A-DAY PROACTIVE 65+) TABS Take 1 tablet by mouth daily with  breakfast.    [provider]  solifenacin (VESICARE) 10 MG tablet TAKE 1 TABLET BY MOUTH ONCE DAILY FOR BLADDER Patient taking differently: Take 10 mg by mouth at bedtime. 07/22/20   Garnet Sierras, NP  vitamin C (ASCORBIC ACID) 500 MG tablet Take 500 mg by mouth every 7 (seven) days.    [provider]    Allergies    Effexor [venlafaxine], Nortriptyline, Other, Paxil [paroxetine hcl], Robaxin [methocarbamol], Tizanidine, and Zoloft [sertraline hcl]  Review of Systems   Review of Systems  Respiratory: Negative for shortness of breath.   Cardiovascular: Negative for chest pain.  Gastrointestinal: Negative for abdominal pain.  Neurological: Negative for headaches.  All other systems reviewed and are negative.  Physical Exam Updated Vital Signs BP (!) 155/63   Pulse 77   Temp 97.7 F (36.5 C) (Oral)   Resp 17   Ht 5\' 5"  (1.651 m)   Wt 67.1 kg   SpO2 94%   BMI 24.63 kg/m   Physical Exam Vitals and nursing note reviewed.  Constitutional:      General: She is not in acute distress.    Appearance: She is well-developed.  HENT:     Head: Normocephalic.     Comments: 2 lacerations to the forehead -  1 laceration at hairline is approximately 4 cm and linear.  Second laceration is just above the left eyebrow.  This is approximately 4 cm with complex jagged injury pattern.  There is evidence injury to the galea underneath.  No foreign body noted. Eyes:     Conjunctiva/sclera: Conjunctivae normal.     Pupils: Pupils are equal, round, and reactive to light.  Cardiovascular:     Rate and Rhythm: Normal rate and regular rhythm.     Heart sounds: Normal heart sounds.  Pulmonary:     Effort: Pulmonary effort is normal. No respiratory distress.     Breath sounds: Normal breath sounds.  Abdominal:     General: There is no distension.     Palpations: Abdomen is soft.     Tenderness: There is no abdominal tenderness.  Musculoskeletal:        General: No  deformity. Normal range of motion.     Cervical back: Normal range of motion and neck supple.  Skin:    General: Skin is warm and dry.  Neurological:     Mental Status: She is alert and oriented to person, place, and time.     ED Results / Procedures / Treatments   Labs (all labs ordered are listed, but only abnormal results are displayed) Labs Reviewed  BASIC METABOLIC PANEL - Abnormal; Notable for the following components:      Result Value   Glucose, Bld 104 (*)    All other components within normal limits  CBC WITH DIFFERENTIAL/PLATELET - Abnormal; Notable for the following components:   WBC 11.6 (*)    RBC 3.85 (*)    Hemoglobin 11.9 (*)    Neutro Abs 9.4 (*)    All other components within normal limits  RESP PANEL BY RT-PCR (FLU A&B, COVID) ARPGX2  PROTIME-INR  TYPE AND SCREEN    EKG None  Radiology CT Head Wo Contrast  Result Date: 11/02/2020 CLINICAL DATA:  Facial trauma.  Fall. EXAM: CT HEAD WITHOUT CONTRAST CT MAXILLOFACIAL WITHOUT CONTRAST CT CERVICAL SPINE WITHOUT CONTRAST TECHNIQUE: Multidetector CT imaging of the head, cervical spine, and maxillofacial structures were performed using the standard protocol without intravenous contrast. Multiplanar CT image reconstructions of the cervical spine and maxillofacial structures were also generated. COMPARISON:  CT head/maxillofacial/cervical spine 09/25/2020. FINDINGS: CT HEAD FINDINGS Brain: Mild generalized parenchymal atrophy. Thin acute extra-axial hematoma within the inferolateral aspect of the left middle cranial fossa (series 1, images 13 and 14) (series 6, image 47). This measures up to 3 mm in thickness. No significant mass effect upon the underlying left temporal lobe. Trace acute subdural hemorrhage is also suspected along the left tentorium (for instance as seen on series 5, image 46). Mild patchy and ill-defined hypoattenuation within the cerebral white matter, nonspecific but compatible with chronic small vessel  ischemic disease. No demarcated cortical infarct. No extra-axial fluid collection. No evidence of intracranial mass. No midline shift. Vascular: No hyperdense vessel.  Atherosclerotic calcifications Skull: Normal. Negative for fracture or focal lesion. Other: Forehead hematoma and laceration eccentric to the left. CT MAXILLOFACIAL FINDINGS Osseous: Mildly displaced bilateral nasal bone fractures, unchanged as compared to the maxillofacial CT of 09/25/2020. No interval acute maxillofacial fracture is identified Orbits: No acute finding within the orbits. The globes are normal in size and contour. The extraocular muscles and optic nerve sheath complexes are symmetric and unremarkable. Sinuses: Trace bilateral ethmoid sinus mucosal thickening. Soft tissues: Forehead hematoma and laceration eccentric to the left. Additional left periorbital and left cheek hematoma. CT CERVICAL SPINE FINDINGS Alignment: Straightening of the expected cervical lordosis. 2 mm C3-C4, C4-C5, C5-C6, C7-T1, T1-T2 and T3-T4 grade 1 anterolisthesis, chronic and unchanged. Partially imaged thoracic levocurvature. Skull base and vertebrae: The basion-dental and atlanto-dental intervals are maintained.No evidence of acute fracture to the cervical spine. Soft tissues and spinal canal: No prevertebral fluid or swelling. No visible canal hematoma. Disc levels: Cervical spondylosis with multilevel disc space narrowing, disc bulges, endplate spurring, uncovertebral hypertrophy and facet arthrosis. Disc space narrowing is advanced at C3-C4, C4-C5, C5-C6, C6-C7 and T2-T3. No appreciable high-grade spinal canal stenosis. Multilevel bony neural foraminal narrowing. Upper chest: Unchanged opacity within the medial left lung apex, likely reflecting scarring. No visible pneumothorax. These results were called by telephone at the time of interpretation on 11/02/2020 at 9:08 pm to provider [facility] , who verbally acknowledged these results. IMPRESSION: CT  head: 1. Acute extra-axial hematoma within the inferolateral aspect of the left middle cranial fossa, measuring up to 3 mm in thickness. 2. Trace acute subdural hematoma is also suspected along the left tentorium. 3. Forehead hematoma and laceration eccentric to the left. 4. Mild generalized parenchymal atrophy and moderate cerebral white matter chronic small vessel ischemic disease, stable as compared to the head CT of 09/25/2020. CT maxillofacial: 1. Mildly displaced bilateral nasal bone fractures, unchanged as compared to the maxillofacial CT of 09/25/2020. 2. No interval acute maxillofacial fracture is identified. 3. Left forehead hematoma and laceration, eccentric to the left. 4. Additional left periorbital and left cheek hematoma. CT cervical spine: 1. No evidence of acute fracture to the cervical spine. 2. Nonspecific straightening of the expected cervical lordosis. 3. 2 mm C3-C4, C4-C5, C5-C6, C7-T1, T1-T2 and T3-T4 grade 1 anterolisthesis, chronic and unchanged as compared to the prior exam of 09/25/2020. 4. Cervical spondylosis, as described. Electronically Signed   By: Kellie Simmering DO   On: 11/02/2020 21:08   CT Cervical Spine Wo Contrast  Result Date: 11/02/2020 CLINICAL DATA:  Facial trauma.  Fall. EXAM: CT HEAD WITHOUT CONTRAST CT MAXILLOFACIAL WITHOUT CONTRAST CT CERVICAL SPINE WITHOUT CONTRAST TECHNIQUE: Multidetector CT imaging of the head, cervical spine, and maxillofacial structures were performed using the standard protocol without intravenous contrast. Multiplanar CT image reconstructions of the cervical spine and maxillofacial structures were also generated. COMPARISON:  CT head/maxillofacial/cervical spine 09/25/2020. FINDINGS: CT HEAD FINDINGS Brain: Mild generalized parenchymal atrophy. Thin acute extra-axial hematoma within the inferolateral aspect of the left middle cranial fossa (series 1, images 13 and 14) (series 6, image 47). This measures up to 3 mm in thickness. No significant  mass effect upon the underlying left temporal lobe. Trace acute subdural hemorrhage is also suspected along the left tentorium (for instance as seen on series 5, image 46). Mild patchy and ill-defined hypoattenuation within the cerebral white matter, nonspecific but compatible with chronic small vessel ischemic disease. No demarcated cortical infarct. No extra-axial fluid collection. No evidence of intracranial mass. No midline shift.  Vascular: No hyperdense vessel.  Atherosclerotic calcifications Skull: Normal. Negative for fracture or focal lesion. Other: Forehead hematoma and laceration eccentric to the left. CT MAXILLOFACIAL FINDINGS Osseous: Mildly displaced bilateral nasal bone fractures, unchanged as compared to the maxillofacial CT of 09/25/2020. No interval acute maxillofacial fracture is identified Orbits: No acute finding within the orbits. The globes are normal in size and contour. The extraocular muscles and optic nerve sheath complexes are symmetric and unremarkable. Sinuses: Trace bilateral ethmoid sinus mucosal thickening. Soft tissues: Forehead hematoma and laceration eccentric to the left. Additional left periorbital and left cheek hematoma. CT CERVICAL SPINE FINDINGS Alignment: Straightening of the expected cervical lordosis. 2 mm C3-C4, C4-C5, C5-C6, C7-T1, T1-T2 and T3-T4 grade 1 anterolisthesis, chronic and unchanged. Partially imaged thoracic levocurvature. Skull base and vertebrae: The basion-dental and atlanto-dental intervals are maintained.No evidence of acute fracture to the cervical spine. Soft tissues and spinal canal: No prevertebral fluid or swelling. No visible canal hematoma. Disc levels: Cervical spondylosis with multilevel disc space narrowing, disc bulges, endplate spurring, uncovertebral hypertrophy and facet arthrosis. Disc space narrowing is advanced at C3-C4, C4-C5, C5-C6, C6-C7 and T2-T3. No appreciable high-grade spinal canal stenosis. Multilevel bony neural foraminal  narrowing. Upper chest: Unchanged opacity within the medial left lung apex, likely reflecting scarring. No visible pneumothorax. These results were called by telephone at the time of interpretation on 11/02/2020 at 9:08 pm to provider [facility] , who verbally acknowledged these results. IMPRESSION: CT head: 1. Acute extra-axial hematoma within the inferolateral aspect of the left middle cranial fossa, measuring up to 3 mm in thickness. 2. Trace acute subdural hematoma is also suspected along the left tentorium. 3. Forehead hematoma and laceration eccentric to the left. 4. Mild generalized parenchymal atrophy and moderate cerebral white matter chronic small vessel ischemic disease, stable as compared to the head CT of 09/25/2020. CT maxillofacial: 1. Mildly displaced bilateral nasal bone fractures, unchanged as compared to the maxillofacial CT of 09/25/2020. 2. No interval acute maxillofacial fracture is identified. 3. Left forehead hematoma and laceration, eccentric to the left. 4. Additional left periorbital and left cheek hematoma. CT cervical spine: 1. No evidence of acute fracture to the cervical spine. 2. Nonspecific straightening of the expected cervical lordosis. 3. 2 mm C3-C4, C4-C5, C5-C6, C7-T1, T1-T2 and T3-T4 grade 1 anterolisthesis, chronic and unchanged as compared to the prior exam of 09/25/2020. 4. Cervical spondylosis, as described. Electronically Signed   By: Kellie Simmering DO   On: 11/02/2020 21:08   CT Maxillofacial WO CM  Result Date: 11/02/2020 CLINICAL DATA:  Facial trauma.  Fall. EXAM: CT HEAD WITHOUT CONTRAST CT MAXILLOFACIAL WITHOUT CONTRAST CT CERVICAL SPINE WITHOUT CONTRAST TECHNIQUE: Multidetector CT imaging of the head, cervical spine, and maxillofacial structures were performed using the standard protocol without intravenous contrast. Multiplanar CT image reconstructions of the cervical spine and maxillofacial structures were also generated. COMPARISON:  CT  head/maxillofacial/cervical spine 09/25/2020. FINDINGS: CT HEAD FINDINGS Brain: Mild generalized parenchymal atrophy. Thin acute extra-axial hematoma within the inferolateral aspect of the left middle cranial fossa (series 1, images 13 and 14) (series 6, image 47). This measures up to 3 mm in thickness. No significant mass effect upon the underlying left temporal lobe. Trace acute subdural hemorrhage is also suspected along the left tentorium (for instance as seen on series 5, image 46). Mild patchy and ill-defined hypoattenuation within the cerebral white matter, nonspecific but compatible with chronic small vessel ischemic disease. No demarcated cortical infarct. No extra-axial fluid collection. No evidence of intracranial  mass. No midline shift. Vascular: No hyperdense vessel.  Atherosclerotic calcifications Skull: Normal. Negative for fracture or focal lesion. Other: Forehead hematoma and laceration eccentric to the left. CT MAXILLOFACIAL FINDINGS Osseous: Mildly displaced bilateral nasal bone fractures, unchanged as compared to the maxillofacial CT of 09/25/2020. No interval acute maxillofacial fracture is identified Orbits: No acute finding within the orbits. The globes are normal in size and contour. The extraocular muscles and optic nerve sheath complexes are symmetric and unremarkable. Sinuses: Trace bilateral ethmoid sinus mucosal thickening. Soft tissues: Forehead hematoma and laceration eccentric to the left. Additional left periorbital and left cheek hematoma. CT CERVICAL SPINE FINDINGS Alignment: Straightening of the expected cervical lordosis. 2 mm C3-C4, C4-C5, C5-C6, C7-T1, T1-T2 and T3-T4 grade 1 anterolisthesis, chronic and unchanged. Partially imaged thoracic levocurvature. Skull base and vertebrae: The basion-dental and atlanto-dental intervals are maintained.No evidence of acute fracture to the cervical spine. Soft tissues and spinal canal: No prevertebral fluid or swelling. No visible canal  hematoma. Disc levels: Cervical spondylosis with multilevel disc space narrowing, disc bulges, endplate spurring, uncovertebral hypertrophy and facet arthrosis. Disc space narrowing is advanced at C3-C4, C4-C5, C5-C6, C6-C7 and T2-T3. No appreciable high-grade spinal canal stenosis. Multilevel bony neural foraminal narrowing. Upper chest: Unchanged opacity within the medial left lung apex, likely reflecting scarring. No visible pneumothorax. These results were called by telephone at the time of interpretation on 11/02/2020 at 9:08 pm to provider [facility] , who verbally acknowledged these results. IMPRESSION: CT head: 1. Acute extra-axial hematoma within the inferolateral aspect of the left middle cranial fossa, measuring up to 3 mm in thickness. 2. Trace acute subdural hematoma is also suspected along the left tentorium. 3. Forehead hematoma and laceration eccentric to the left. 4. Mild generalized parenchymal atrophy and moderate cerebral white matter chronic small vessel ischemic disease, stable as compared to the head CT of 09/25/2020. CT maxillofacial: 1. Mildly displaced bilateral nasal bone fractures, unchanged as compared to the maxillofacial CT of 09/25/2020. 2. No interval acute maxillofacial fracture is identified. 3. Left forehead hematoma and laceration, eccentric to the left. 4. Additional left periorbital and left cheek hematoma. CT cervical spine: 1. No evidence of acute fracture to the cervical spine. 2. Nonspecific straightening of the expected cervical lordosis. 3. 2 mm C3-C4, C4-C5, C5-C6, C7-T1, T1-T2 and T3-T4 grade 1 anterolisthesis, chronic and unchanged as compared to the prior exam of 09/25/2020. 4. Cervical spondylosis, as described. Electronically Signed   By: Kellie Simmering DO   On: 11/02/2020 21:08    Procedures .Marland KitchenLaceration Repair  Date/Time: 11/02/2020 10:24 PM Performed by: Valarie Merino, MD Authorized by: Valarie Merino, MD   Consent:    Consent obtained:  Verbal    Consent given by:  Patient   Risks, benefits, and alternatives were discussed: yes     Risks discussed:  Infection, pain, poor cosmetic result, need for additional repair, nerve damage, poor wound healing, retained foreign body, tendon damage and vascular damage   Alternatives discussed:  No treatment Universal protocol:    Immediately prior to procedure, a time out was called: yes     Patient identity confirmed:  Verbally with patient Anesthesia:    Anesthesia method:  Local infiltration   Local anesthetic:  Lidocaine 2% WITH epi Laceration details:    Location: Left forehead just above left eye.   Length (cm):  4   Depth (mm):  3 Pre-procedure details:    Preparation:  Patient was prepped and draped in usual sterile fashion Exploration:  Hemostasis achieved with:  Direct pressure   Imaging outcome: foreign body not noted     Wound exploration: wound explored through full range of motion and entire depth of wound visualized     Wound extent: fascia violated     Wound extent: no areolar tissue violation noted, no foreign bodies/material noted, no muscle damage noted, no nerve damage noted, no tendon damage noted, no underlying fracture noted and no vascular damage noted     Wound extent comment:  Galea defect noted   Contaminated: no   Treatment:    Area cleansed with:  Saline and povidone-iodine   Amount of cleaning:  Standard   Irrigation solution:  Sterile saline   Irrigation method:  Pressure wash and syringe   Visualized foreign bodies/material removed: no     Debridement:  None   Undermining:  None   Scar revision: no     Layers/structures repaired:  Janae Sauce:    Suture size:  5-0   Suture material:  Vicryl   Suture technique:  Simple interrupted   Number of sutures:  2 Skin repair:    Repair method:  Sutures   Suture size:  5-0   Suture material:  Prolene   Suture technique:  Running Approximation:    Approximation:  Close Repair type:    Repair type:   Intermediate Post-procedure details:    Dressing:  Open (no dressing)   Procedure completion:  Tolerated .Marland KitchenLaceration Repair  Date/Time: 11/02/2020 10:28 PM Performed by: Valarie Merino, MD Authorized by: Valarie Merino, MD   Consent:    Consent obtained:  Verbal   Consent given by:  Patient   Risks, benefits, and alternatives were discussed: yes     Risks discussed:  Infection, need for additional repair, poor wound healing, pain, poor cosmetic result, nerve damage, tendon damage, vascular damage and retained foreign body   Alternatives discussed:  No treatment Universal protocol:    Immediately prior to procedure, a time out was called: yes     Patient identity confirmed:  Verbally with patient Anesthesia:    Anesthesia method:  Local infiltration   Local anesthetic:  Lidocaine 2% WITH epi Laceration details:    Location: mid forehead at hairline    Length (cm):  4 Pre-procedure details:    Preparation:  Patient was prepped and draped in usual sterile fashion Exploration:    Imaging outcome: foreign body not noted     Wound exploration: entire depth of wound visualized     Contaminated: no   Treatment:    Area cleansed with:  Povidone-iodine and saline   Amount of cleaning:  Standard   Irrigation solution:  Sterile saline   Irrigation method:  Syringe   Debridement:  None   Scar revision: no   Skin repair:    Repair method:  Sutures   Suture size:  5-0   Suture material:  Prolene   Suture technique:  Running Approximation:    Approximation:  Close Repair type:    Repair type:  Simple Post-procedure details:    Dressing:  Open (no dressing)   Procedure completion:  Tolerated     Medications Ordered in ED Medications  Tdap (BOOSTRIX) injection 0.5 mL (0.5 mLs Intramuscular Given 11/02/20 1945)  lidocaine-EPINEPHrine (XYLOCAINE W/EPI) 2 %-1:200000 (PF) injection 10 mL (10 mLs Infiltration Given 11/02/20 2110)  acetaminophen (TYLENOL) tablet 1,000 mg (1,000 mg  Oral Given 11/02/20 2119)    ED Course  I have reviewed the triage vital signs  and the nursing notes.  Pertinent labs & imaging results that were available during my care of the patient were reviewed by me and considered in my medical decision making (see chart for details).    MDM Rules/Calculators/A&P                         MDM  MSE complete  Debra Short was evaluated in Emergency Department on 11/02/2020 for the symptoms described in the history of present illness. She was evaluated in the context of the global COVID-19 pandemic, which necessitated consideration that the patient might be at risk for infection with the SARS-CoV-2 virus that causes COVID-19. Institutional protocols and algorithms that pertain to the evaluation of patients at risk for COVID-19 are in a state of rapid change based on information released by regulatory bodies including the CDC and federal and state organizations. These policies and algorithms were followed during the patient's care in the ED.  Patient is presenting with complaint of fall from standing with head injury and lacerations to the forehead.  CT imaging is concerning for possible minimal intracranial hemorrhage.  Case and CT reviewed with neurosurgery.  Dr. Ronnald Ramp does not feel that the patient requires operative intervention.  He recommends repeat CT imaging in approximately 10 to 12 hours.  Forehead lacerations repaired.  Patient tolerated this well.  Hospitalist service is aware of case and will evaluate for observation admission.  Final Clinical Impression(s) / ED Diagnoses Final diagnoses:  Injury of head, initial encounter  Fall, initial encounter  Laceration of forehead, initial encounter    Rx / DC Orders ED Discharge Orders    None       Valarie Merino, MD 11/02/20 2236

## 2020-11-02 NOTE — ED Triage Notes (Signed)
Pt brought in by EMS from home for a ground level fall. Pt denies LOC. Pt has a laceration to the right side of her head and her forehead.

## 2020-11-03 ENCOUNTER — Observation Stay (HOSPITAL_COMMUNITY): Payer: PPO

## 2020-11-03 ENCOUNTER — Encounter (HOSPITAL_COMMUNITY): Payer: Self-pay | Admitting: Internal Medicine

## 2020-11-03 DIAGNOSIS — R531 Weakness: Secondary | ICD-10-CM | POA: Diagnosis not present

## 2020-11-03 DIAGNOSIS — N182 Chronic kidney disease, stage 2 (mild): Secondary | ICD-10-CM | POA: Diagnosis not present

## 2020-11-03 DIAGNOSIS — S065X0A Traumatic subdural hemorrhage without loss of consciousness, initial encounter: Secondary | ICD-10-CM | POA: Diagnosis not present

## 2020-11-03 DIAGNOSIS — I618 Other nontraumatic intracerebral hemorrhage: Secondary | ICD-10-CM | POA: Diagnosis not present

## 2020-11-03 DIAGNOSIS — S0181XA Laceration without foreign body of other part of head, initial encounter: Secondary | ICD-10-CM

## 2020-11-03 DIAGNOSIS — S065X9A Traumatic subdural hemorrhage with loss of consciousness of unspecified duration, initial encounter: Secondary | ICD-10-CM | POA: Diagnosis not present

## 2020-11-03 DIAGNOSIS — Z23 Encounter for immunization: Secondary | ICD-10-CM | POA: Diagnosis not present

## 2020-11-03 DIAGNOSIS — Z7982 Long term (current) use of aspirin: Secondary | ICD-10-CM | POA: Diagnosis not present

## 2020-11-03 DIAGNOSIS — G319 Degenerative disease of nervous system, unspecified: Secondary | ICD-10-CM | POA: Diagnosis not present

## 2020-11-03 DIAGNOSIS — E1122 Type 2 diabetes mellitus with diabetic chronic kidney disease: Secondary | ICD-10-CM | POA: Diagnosis not present

## 2020-11-03 DIAGNOSIS — Z79899 Other long term (current) drug therapy: Secondary | ICD-10-CM | POA: Diagnosis not present

## 2020-11-03 DIAGNOSIS — Z87828 Personal history of other (healed) physical injury and trauma: Secondary | ICD-10-CM | POA: Diagnosis not present

## 2020-11-03 DIAGNOSIS — Z7984 Long term (current) use of oral hypoglycemic drugs: Secondary | ICD-10-CM | POA: Diagnosis not present

## 2020-11-03 DIAGNOSIS — I129 Hypertensive chronic kidney disease with stage 1 through stage 4 chronic kidney disease, or unspecified chronic kidney disease: Secondary | ICD-10-CM | POA: Diagnosis not present

## 2020-11-03 DIAGNOSIS — I6200 Nontraumatic subdural hemorrhage, unspecified: Secondary | ICD-10-CM | POA: Diagnosis not present

## 2020-11-03 DIAGNOSIS — Z20822 Contact with and (suspected) exposure to covid-19: Secondary | ICD-10-CM | POA: Diagnosis not present

## 2020-11-03 LAB — GLUCOSE, CAPILLARY
Glucose-Capillary: 148 mg/dL — ABNORMAL HIGH (ref 70–99)
Glucose-Capillary: 137 mg/dL — ABNORMAL HIGH (ref 70–99)
Glucose-Capillary: 95 mg/dL (ref 70–99)
Glucose-Capillary: 101 mg/dL — ABNORMAL HIGH (ref 70–99)

## 2020-11-03 LAB — CBC
HCT: 35.7 % — ABNORMAL LOW (ref 36.0–46.0)
Hemoglobin: 11.8 g/dL — ABNORMAL LOW (ref 12.0–15.0)
MCH: 30.7 pg (ref 26.0–34.0)
MCHC: 33.1 g/dL (ref 30.0–36.0)
MCV: 93 fL (ref 80.0–100.0)
Platelets: 263 10*3/uL (ref 150–400)
RBC: 3.84 MIL/uL — ABNORMAL LOW (ref 3.87–5.11)
RDW: 12.1 % (ref 11.5–15.5)
WBC: 13.2 10*3/uL — ABNORMAL HIGH (ref 4.0–10.5)
nRBC: 0 % (ref 0.0–0.2)

## 2020-11-03 LAB — BASIC METABOLIC PANEL
Anion gap: 8 (ref 5–15)
BUN: 13 mg/dL (ref 8–23)
CO2: 29 mmol/L (ref 22–32)
Calcium: 9.3 mg/dL (ref 8.9–10.3)
Chloride: 101 mmol/L (ref 98–111)
Creatinine, Ser: 0.84 mg/dL (ref 0.44–1.00)
GFR, Estimated: >60 mL/min (ref >60)
Glucose, Bld: 106 mg/dL — ABNORMAL HIGH (ref 70–99)
Potassium: 4.3 mmol/L (ref 3.5–5.1)
Sodium: 138 mmol/L (ref 135–145)

## 2020-11-03 MED ORDER — ATORVASTATIN CALCIUM 10 MG PO TABS
30.0000 mg | ORAL_TABLET | Freq: Every day | ORAL | Status: DC
  Administered 2020-11-04: 30 mg via ORAL
  Filled 2020-11-03: qty 3

## 2020-11-03 MED ORDER — ONDANSETRON HCL 4 MG/2ML IJ SOLN
INTRAMUSCULAR | Status: AC
  Filled 2020-11-03: qty 2

## 2020-11-03 MED ORDER — ATORVASTATIN CALCIUM 20 MG PO TABS
26.7000 mg | ORAL_TABLET | Freq: Every day | ORAL | Status: DC
  Administered 2020-11-03: 25 mg via ORAL
  Filled 2020-11-03 (×2): qty 0.5

## 2020-11-03 MED ORDER — ACETAMINOPHEN 325 MG PO TABS
650.0000 mg | ORAL_TABLET | Freq: Four times a day (QID) | ORAL | Status: DC | PRN
  Administered 2020-11-03 (×3): 650 mg via ORAL
  Filled 2020-11-03 (×3): qty 2

## 2020-11-03 MED ORDER — ONDANSETRON HCL 4 MG/2ML IJ SOLN
4.0000 mg | Freq: Once | INTRAMUSCULAR | Status: AC
  Administered 2020-11-03: 4 mg via INTRAVENOUS
  Filled 2020-11-03: qty 2

## 2020-11-03 MED ORDER — INSULIN ASPART 100 UNIT/ML IJ SOLN: 0.0000 [IU] | Freq: Three times a day (TID) | INTRAMUSCULAR | Status: DC

## 2020-11-03 MED ORDER — PANTOPRAZOLE SODIUM 40 MG PO TBEC
40.0000 mg | DELAYED_RELEASE_TABLET | Freq: Every day | ORAL | Status: DC
  Administered 2020-11-03 – 2020-11-04 (×2): 40 mg via ORAL
  Filled 2020-11-03 (×2): qty 1

## 2020-11-03 MED ORDER — ONDANSETRON HCL 4 MG/2ML IJ SOLN
4.0000 mg | Freq: Four times a day (QID) | INTRAMUSCULAR | Status: DC | PRN
  Administered 2020-11-03 (×2): 4 mg via INTRAVENOUS
  Filled 2020-11-03: qty 2

## 2020-11-03 MED ORDER — ACETAMINOPHEN 650 MG RE SUPP: 650.0000 mg | Freq: Four times a day (QID) | RECTAL | Status: DC | PRN

## 2020-11-03 MED ORDER — GABAPENTIN 300 MG PO CAPS: 300.0000 mg | ORAL_CAPSULE | Freq: Three times a day (TID) | ORAL | Status: DC | PRN

## 2020-11-03 MED ORDER — FLUOXETINE HCL 20 MG PO CAPS
40.0000 mg | ORAL_CAPSULE | Freq: Every day | ORAL | Status: DC
  Administered 2020-11-03 (×2): 40 mg via ORAL
  Filled 2020-11-03 (×2): qty 2

## 2020-11-03 MED ORDER — GABAPENTIN 300 MG PO CAPS
300.0000 mg | ORAL_CAPSULE | Freq: Every day | ORAL | Status: DC
  Administered 2020-11-03 (×2): 300 mg via ORAL
  Filled 2020-11-03 (×2): qty 1

## 2020-11-03 MED ORDER — MONTELUKAST SODIUM 10 MG PO TABS: 5.0000 mg | ORAL_TABLET | Freq: Every evening | ORAL | Status: DC | PRN

## 2020-11-03 MED ORDER — GABAPENTIN 300 MG PO CAPS: 300.0000 mg | ORAL_CAPSULE | ORAL | Status: DC

## 2020-11-03 MED ORDER — DARIFENACIN HYDROBROMIDE ER 7.5 MG PO TB24
7.5000 mg | ORAL_TABLET | Freq: Every day | ORAL | Status: DC
  Administered 2020-11-03: 7.5 mg via ORAL
  Filled 2020-11-03 (×2): qty 1

## 2020-11-03 NOTE — H&P (Signed)
History and Physical    MARSHEILA ALEJO JSH:[id number] DOB: 08/04/1940 DOA: 11/02/2020  PCP: Unk [name], MD  Patient coming from: Home.  Chief Complaint: Fall.  Head injury.  HPI: MARGERET STACHNIK is a 80 y.o. female with history of diabetes mellitus type 2, hyperlipidemia, chronic anemia previous history of lymphoma had a fall at home and hit her head.  Patient states she has been having frequent falls recently.  Does not recall the exact circumstances and what makes a fall.  Denies losing consciousness.  Denies any chest pain shortness of breath or palpitations.  ED Course: In the ER on exam patient has laceration on the forehead and CT head shows acute extra-axial hematoma of the inferolateral aspect of the left middle cranial fossa measuring up to 3 mm and trace subdural hematoma along the left side.  On-call neurosurgeon Dr. Ronnald Ramp was consulted by ER physician requested admit for observation and repeat CT head.  Patient's laceration of the forehead was sutured.  Labs are largely at baseline with a hemoglobin of 11.9.  COVID test was negative.  Review of Systems: As per HPI, rest all negative.   Past Medical History:  Diagnosis Date  . Allergy   . Diabetes mellitus without complication ([place])   . Fibromyalgia   . History of migraine 08/25/2016  . Hyperlipidemia   . IBS (irritable bowel syndrome)   . 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 Vitamin D deficiency     Past Surgical History:  Procedure Laterality Date  . ABDOMINAL HYSTERECTOMY    . ORIF ANKLE FRACTURE Left 09/26/2014  . ORIF ANKLE FRACTURE Left 09/26/2014   Procedure: OPEN REDUCTION INTERNAL FIXATION (ORIF) ANKLE FRACTURE;  Surgeon: [name], MD;  Location: [facility];  Service: Orthopedics;  Laterality: Left;  . TONSILLECTOMY       reports that she has never smoked. She has never used smokeless tobacco. She reports that she does not drink alcohol and does not use drugs.  Allergies  Allergen Reactions  . Effexor  [Venlafaxine] Other (See Comments)    Not effective  . Nortriptyline Other (See Comments)    Not effective  . Other Other (See Comments)    Spicy foods cause heartburn  . Paxil [Paroxetine Hcl] Other (See Comments)    Not effective  . Robaxin [Methocarbamol] Itching  . Tizanidine Itching  . Zoloft [Sertraline Hcl] Other (See Comments)    Not effective    Family History  Problem Relation Age of Onset  . Cancer Mother   . Cancer Father   . Cancer Brother   . Cancer Maternal Grandfather     Prior to Admission medications   Medication Sig Start Date End Date Taking? Authorizing Provider  acetaminophen (TYLENOL) 650 MG CR tablet Take 650 mg by mouth See admin instructions. Take 650 mg by mouth at bedtime and an additional 650 mg during the day as needed for pain   Yes [provider]  ALPRAZolam (XANAX) 1 MG tablet TAKE 1/2 - 1 TABLET 2 - 3 X /DAY ONLY IF NEEDED FOR ANXIETY ATTACK & LIMIT TO 5 DAYS /WEEK TO AVOID ADDICTION & DEMENTIA Patient taking differently: Take 0.5-1 mg by mouth See admin instructions. Take 0.5-1 mg by mouth two to to three times a day "only if needed for an anxiety attack and limit to 5 days a week to avoid addiction and dementia" 09/24/20  Yes Liane Comber, NP  aspirin EC 81 MG tablet Take 81 mg by mouth daily  with lunch.   Yes [provider]  atorvastatin (LIPITOR) 80 MG tablet TAKE 1/3 TABLET BY MOUTH DAILY FOR CHOLESTEROL. DISCONTINUE PRAVASTATIN Patient taking differently: Take 26.7 mg by mouth daily. 02/25/20  Yes Liane Comber, NP  Cholecalciferol (VITAMIN D-3 PO) Take 1-2 capsules by mouth daily.   Yes [provider]  esomeprazole (NEXIUM) 40 MG capsule Take     1 capsule     Daily     to Prevent Heartburn & Indigestion Patient taking differently: Take 40 mg by mouth daily as needed (for heartburn or reflux). 03/13/20  Yes Unk [name], MD  FLUoxetine (PROZAC) 40 MG capsule TAKE 1 CAPSULE DAILY FOR MOOD Patient taking  differently: Take 40 mg by mouth daily in the afternoon. 02/11/20  Yes Liane Comber, NP  fluticasone (FLONASE) 50 MCG/ACT nasal spray Place 1-2 sprays into both nostrils daily as needed for allergies or rhinitis. 09/14/20  Yes Liane Comber, NP  gabapentin (NEURONTIN) 300 MG capsule Take  2 capsules  2 x /day  for Pain Patient taking differently: Take 300 mg by mouth See admin instructions. Take 300 mg by mouth at bedtime and an additional 300 mg one to three times a day as needed for pain 07/20/20  Yes Unk [name], MD  metFORMIN (GLUCOPHAGE-XR) 500 MG 24 hr tablet TAKE 2 TABLETS BY MOUTH 2 X /DAY WITH MEALS FOR DIABETES Patient taking differently: Take 500 mg by mouth See admin instructions. Take 500 mg by mouth with lunch, dinner, and at bedtime 09/12/20  Yes Corbett, Caryl Pina, NP  montelukast (SINGULAIR) 10 MG tablet TAKE 1 TABLET BY MOUTH DAILY FOR ALLERGIES Patient taking differently: Take 5-10 mg by mouth at bedtime as needed (for allergies). 06/18/20  Yes Liane Comber, NP  Multiple Vitamins-Minerals (ICAPS) CAPS Take 1 capsule by mouth daily with lunch.   Yes [provider]  Multiple Vitamins-Minerals (ONE-A-DAY PROACTIVE 65+) TABS Take 1 tablet by mouth daily with breakfast.   Yes [provider]  solifenacin (VESICARE) 10 MG tablet TAKE 1 TABLET BY MOUTH ONCE DAILY FOR BLADDER Patient taking differently: Take 10 mg by mouth at bedtime. 07/22/20  Yes McClanahan, Danton Sewer, NP  vitamin C (ASCORBIC ACID) 500 MG tablet Take 500 mg by mouth every 7 (seven) days.   Yes [provider]    Physical Exam: Constitutional: Moderately built and nourished. Vitals:   11/02/20 2330 11/02/20 2345 11/03/20 0000 11/03/20 0025  BP: (!) 147/58 (!) 146/75  (!) 179/58  Pulse: 78 77 77 74  Resp: (!) 21  19 17   Temp:    98.7 F (37.1 C)  TempSrc:    Oral  SpO2: 96% 95% 94% 97%  Weight:    68.6 kg  Height:    5\' 5"  (1.651 m)   Eyes: Anicteric no pallor. ENMT: Laceration of the  left forehead which was sutured. Neck: No neck rigidity. Respiratory: No rhonchi or crepitations. Cardiovascular: S1-S2 heard. Abdomen: Soft nontender bowel sounds present. Musculoskeletal: No edema. Skin: Sutured laceration on the forehead. Neurologic: Alert awake oriented to time place and person.  Moves all extremities. Psychiatric: Appears normal.  Normal affect.   Labs on Admission: I have personally reviewed following labs and imaging studies  CBC: Recent Labs  Lab 11/02/20 2125  WBC 11.6*  NEUTROABS 9.4*  HGB 11.9*  HCT 36.6  MCV 95.1  PLT 097   Basic Metabolic Panel: Recent Labs  Lab 11/02/20 2125  NA 136  K 4.1  CL 102  CO2 27  GLUCOSE  104*  BUN 15  CREATININE 0.87  CALCIUM 9.1   GFR: Estimated Creatinine Clearance: 48.5 mL/min (by C-G formula based on SCr of 0.87 mg/dL). Liver Function Tests: No results for input(s): AST, ALT, ALKPHOS, BILITOT, PROT, ALBUMIN in the last 168 hours. No results for input(s): LIPASE, AMYLASE in the last 168 hours. No results for input(s): AMMONIA in the last 168 hours. Coagulation Profile: Recent Labs  Lab 11/02/20 2125  INR 1.0   Cardiac Enzymes: No results for input(s): CKTOTAL, CKMB, CKMBINDEX, TROPONINI in the last 168 hours. BNP (last 3 results) No results for input(s): PROBNP in the last 8760 hours. HbA1C: No results for input(s): HGBA1C in the last 72 hours. CBG: No results for input(s): GLUCAP in the last 168 hours. Lipid Profile: No results for input(s): CHOL, HDL, LDLCALC, TRIG, CHOLHDL, LDLDIRECT in the last 72 hours. Thyroid Function Tests: No results for input(s): TSH, T4TOTAL, FREET4, T3FREE, THYROIDAB in the last 72 hours. Anemia Panel: No results for input(s): VITAMINB12, FOLATE, FERRITIN, TIBC, IRON, RETICCTPCT in the last 72 hours. Urine analysis:    Component Value Date/Time   COLORURINE DARK YELLOW 09/16/2020 [place] 09/16/2020 1544   LABSPEC 1.024 09/16/2020 1544    PHURINE 7.0 09/16/2020 1544   GLUCOSEU NEGATIVE 09/16/2020 1544   HGBUR NEGATIVE 09/16/2020 1544   BILIRUBINUR NEGATIVE 03/25/2017 1831   KETONESUR NEGATIVE 09/16/2020 1544   PROTEINUR TRACE (A) 09/16/2020 1544   UROBILINOGEN 1.0 10/31/2014 0238   NITRITE NEGATIVE 09/16/2020 1544   LEUKOCYTESUR 2+ (A) 09/16/2020 1544   Sepsis Labs: @LABRCNTIP (procalcitonin:4,lacticidven:4) ) Recent Results (from the past 240 hour(s))  Resp Panel by RT-PCR (Flu A&B, Covid) Nasopharyngeal Swab     Status: None   Collection Time: 11/02/20  9:26 PM   Specimen: Nasopharyngeal Swab; Nasopharyngeal(NP) swabs in vial transport medium  Result Value Ref Range Status   SARS Coronavirus 2 by RT PCR NEGATIVE NEGATIVE Final    Comment: (NOTE) SARS-CoV-2 target nucleic acids are NOT DETECTED.  The SARS-CoV-2 RNA is generally detectable in upper respiratory specimens during the acute phase of infection. The lowest concentration of SARS-CoV-2 viral copies this assay can detect is 138 copies/mL. A negative result does not preclude SARS-Cov-2 infection and should not be used as the sole basis for treatment or other patient management decisions. A negative result may occur with  improper specimen collection/handling, submission of specimen other than nasopharyngeal swab, presence of viral mutation(s) within the areas targeted by this assay, and inadequate number of viral copies(<138 copies/mL). A negative result must be combined with clinical observations, patient history, and epidemiological information. The expected result is Negative.  Fact Sheet for Patients:  EntrepreneurPulse.com.au  Fact Sheet for Healthcare Providers:  IncredibleEmployment.be  This test is no t yet approved or cleared by the [place] FDA and  has been authorized for detection and/or diagnosis of SARS-CoV-2 by FDA under an Emergency Use Authorization (EUA). This EUA will remain  in effect  (meaning this test can be used) for the duration of the COVID-19 declaration under Section 564(b)(1) of the Act, 21 U.S.C.section 360bbb-3(b)(1), unless the authorization is terminated  or revoked sooner.       Influenza A by PCR NEGATIVE NEGATIVE Final   Influenza B by PCR NEGATIVE NEGATIVE Final    Comment: (NOTE) The Xpert Xpress SARS-CoV-2/FLU/RSV plus assay is intended as an aid in the diagnosis of influenza from Nasopharyngeal swab specimens and should not be used as a sole basis for treatment. Nasal washings and  aspirates are unacceptable for Xpert Xpress SARS-CoV-2/FLU/RSV testing.  Fact Sheet for Patients: EntrepreneurPulse.com.au  Fact Sheet for Healthcare Providers: IncredibleEmployment.be  This test is not yet approved or cleared by the [place] FDA and has been authorized for detection and/or diagnosis of SARS-CoV-2 by FDA under an Emergency Use Authorization (EUA). This EUA will remain in effect (meaning this test can be used) for the duration of the COVID-19 declaration under Section 564(b)(1) of the Act, 21 U.S.C. section 360bbb-3(b)(1), unless the authorization is terminated or revoked.  Performed at [facility], [place] [address], [place], [place] [address]      Radiological Exams on Admission: CT Head Wo Contrast  Result Date: 11/02/2020 CLINICAL DATA:  Facial trauma.  Fall. EXAM: CT HEAD WITHOUT CONTRAST CT MAXILLOFACIAL WITHOUT CONTRAST CT CERVICAL SPINE WITHOUT CONTRAST TECHNIQUE: Multidetector CT imaging of the head, cervical spine, and maxillofacial structures were performed using the standard protocol without intravenous contrast. Multiplanar CT image reconstructions of the cervical spine and maxillofacial structures were also generated. COMPARISON:  CT head/maxillofacial/cervical spine 09/25/2020. FINDINGS: CT HEAD FINDINGS Brain: Mild generalized parenchymal atrophy. Thin acute extra-axial hematoma within the  inferolateral aspect of the left middle cranial fossa (series 1, images 13 and 14) (series 6, image 47). This measures up to 3 mm in thickness. No significant mass effect upon the underlying left temporal lobe. Trace acute subdural hemorrhage is also suspected along the left tentorium (for instance as seen on series 5, image 46). Mild patchy and ill-defined hypoattenuation within the cerebral white matter, nonspecific but compatible with chronic small vessel ischemic disease. No demarcated cortical infarct. No extra-axial fluid collection. No evidence of intracranial mass. No midline shift. Vascular: No hyperdense vessel.  Atherosclerotic calcifications Skull: Normal. Negative for fracture or focal lesion. Other: Forehead hematoma and laceration eccentric to the left. CT MAXILLOFACIAL FINDINGS Osseous: Mildly displaced bilateral nasal bone fractures, unchanged as compared to the maxillofacial CT of 09/25/2020. No interval acute maxillofacial fracture is identified Orbits: No acute finding within the orbits. The globes are normal in size and contour. The extraocular muscles and optic nerve sheath complexes are symmetric and unremarkable. Sinuses: Trace bilateral ethmoid sinus mucosal thickening. Soft tissues: Forehead hematoma and laceration eccentric to the left. Additional left periorbital and left cheek hematoma. CT CERVICAL SPINE FINDINGS Alignment: Straightening of the expected cervical lordosis. 2 mm C3-C4, C4-C5, C5-C6, C7-T1, T1-T2 and T3-T4 grade 1 anterolisthesis, chronic and unchanged. Partially imaged thoracic levocurvature. Skull base and vertebrae: The basion-dental and atlanto-dental intervals are maintained.No evidence of acute fracture to the cervical spine. Soft tissues and spinal canal: No prevertebral fluid or swelling. No visible canal hematoma. Disc levels: Cervical spondylosis with multilevel disc space narrowing, disc bulges, endplate spurring, uncovertebral hypertrophy and facet arthrosis.  Disc space narrowing is advanced at C3-C4, C4-C5, C5-C6, C6-C7 and T2-T3. No appreciable high-grade spinal canal stenosis. Multilevel bony neural foraminal narrowing. Upper chest: Unchanged opacity within the medial left lung apex, likely reflecting scarring. No visible pneumothorax. These results were called by telephone at the time of interpretation on 11/02/2020 at 9:08 pm to provider [facility] , who verbally acknowledged these results. IMPRESSION: CT head: 1. Acute extra-axial hematoma within the inferolateral aspect of the left middle cranial fossa, measuring up to 3 mm in thickness. 2. Trace acute subdural hematoma is also suspected along the left tentorium. 3. Forehead hematoma and laceration eccentric to the left. 4. Mild generalized parenchymal atrophy and moderate cerebral white matter chronic small vessel ischemic disease, stable as compared  to the head CT of 09/25/2020. CT maxillofacial: 1. Mildly displaced bilateral nasal bone fractures, unchanged as compared to the maxillofacial CT of 09/25/2020. 2. No interval acute maxillofacial fracture is identified. 3. Left forehead hematoma and laceration, eccentric to the left. 4. Additional left periorbital and left cheek hematoma. CT cervical spine: 1. No evidence of acute fracture to the cervical spine. 2. Nonspecific straightening of the expected cervical lordosis. 3. 2 mm C3-C4, C4-C5, C5-C6, C7-T1, T1-T2 and T3-T4 grade 1 anterolisthesis, chronic and unchanged as compared to the prior exam of 09/25/2020. 4. Cervical spondylosis, as described. Electronically Signed   By: Kellie Simmering DO   On: 11/02/2020 21:08   CT Cervical Spine Wo Contrast  Result Date: 11/02/2020 CLINICAL DATA:  Facial trauma.  Fall. EXAM: CT HEAD WITHOUT CONTRAST CT MAXILLOFACIAL WITHOUT CONTRAST CT CERVICAL SPINE WITHOUT CONTRAST TECHNIQUE: Multidetector CT imaging of the head, cervical spine, and maxillofacial structures were performed using the standard protocol without  intravenous contrast. Multiplanar CT image reconstructions of the cervical spine and maxillofacial structures were also generated. COMPARISON:  CT head/maxillofacial/cervical spine 09/25/2020. FINDINGS: CT HEAD FINDINGS Brain: Mild generalized parenchymal atrophy. Thin acute extra-axial hematoma within the inferolateral aspect of the left middle cranial fossa (series 1, images 13 and 14) (series 6, image 47). This measures up to 3 mm in thickness. No significant mass effect upon the underlying left temporal lobe. Trace acute subdural hemorrhage is also suspected along the left tentorium (for instance as seen on series 5, image 46). Mild patchy and ill-defined hypoattenuation within the cerebral white matter, nonspecific but compatible with chronic small vessel ischemic disease. No demarcated cortical infarct. No extra-axial fluid collection. No evidence of intracranial mass. No midline shift. Vascular: No hyperdense vessel.  Atherosclerotic calcifications Skull: Normal. Negative for fracture or focal lesion. Other: Forehead hematoma and laceration eccentric to the left. CT MAXILLOFACIAL FINDINGS Osseous: Mildly displaced bilateral nasal bone fractures, unchanged as compared to the maxillofacial CT of 09/25/2020. No interval acute maxillofacial fracture is identified Orbits: No acute finding within the orbits. The globes are normal in size and contour. The extraocular muscles and optic nerve sheath complexes are symmetric and unremarkable. Sinuses: Trace bilateral ethmoid sinus mucosal thickening. Soft tissues: Forehead hematoma and laceration eccentric to the left. Additional left periorbital and left cheek hematoma. CT CERVICAL SPINE FINDINGS Alignment: Straightening of the expected cervical lordosis. 2 mm C3-C4, C4-C5, C5-C6, C7-T1, T1-T2 and T3-T4 grade 1 anterolisthesis, chronic and unchanged. Partially imaged thoracic levocurvature. Skull base and vertebrae: The basion-dental and atlanto-dental intervals are  maintained.No evidence of acute fracture to the cervical spine. Soft tissues and spinal canal: No prevertebral fluid or swelling. No visible canal hematoma. Disc levels: Cervical spondylosis with multilevel disc space narrowing, disc bulges, endplate spurring, uncovertebral hypertrophy and facet arthrosis. Disc space narrowing is advanced at C3-C4, C4-C5, C5-C6, C6-C7 and T2-T3. No appreciable high-grade spinal canal stenosis. Multilevel bony neural foraminal narrowing. Upper chest: Unchanged opacity within the medial left lung apex, likely reflecting scarring. No visible pneumothorax. These results were called by telephone at the time of interpretation on 11/02/2020 at 9:08 pm to provider [facility] , who verbally acknowledged these results. IMPRESSION: CT head: 1. Acute extra-axial hematoma within the inferolateral aspect of the left middle cranial fossa, measuring up to 3 mm in thickness. 2. Trace acute subdural hematoma is also suspected along the left tentorium. 3. Forehead hematoma and laceration eccentric to the left. 4. Mild generalized parenchymal atrophy and moderate cerebral white matter chronic small vessel  ischemic disease, stable as compared to the head CT of 09/25/2020. CT maxillofacial: 1. Mildly displaced bilateral nasal bone fractures, unchanged as compared to the maxillofacial CT of 09/25/2020. 2. No interval acute maxillofacial fracture is identified. 3. Left forehead hematoma and laceration, eccentric to the left. 4. Additional left periorbital and left cheek hematoma. CT cervical spine: 1. No evidence of acute fracture to the cervical spine. 2. Nonspecific straightening of the expected cervical lordosis. 3. 2 mm C3-C4, C4-C5, C5-C6, C7-T1, T1-T2 and T3-T4 grade 1 anterolisthesis, chronic and unchanged as compared to the prior exam of 09/25/2020. 4. Cervical spondylosis, as described. Electronically Signed   By: Kellie Simmering DO   On: 11/02/2020 21:08   CT Maxillofacial WO CM  Result Date:  11/02/2020 CLINICAL DATA:  Facial trauma.  Fall. EXAM: CT HEAD WITHOUT CONTRAST CT MAXILLOFACIAL WITHOUT CONTRAST CT CERVICAL SPINE WITHOUT CONTRAST TECHNIQUE: Multidetector CT imaging of the head, cervical spine, and maxillofacial structures were performed using the standard protocol without intravenous contrast. Multiplanar CT image reconstructions of the cervical spine and maxillofacial structures were also generated. COMPARISON:  CT head/maxillofacial/cervical spine 09/25/2020. FINDINGS: CT HEAD FINDINGS Brain: Mild generalized parenchymal atrophy. Thin acute extra-axial hematoma within the inferolateral aspect of the left middle cranial fossa (series 1, images 13 and 14) (series 6, image 47). This measures up to 3 mm in thickness. No significant mass effect upon the underlying left temporal lobe. Trace acute subdural hemorrhage is also suspected along the left tentorium (for instance as seen on series 5, image 46). Mild patchy and ill-defined hypoattenuation within the cerebral white matter, nonspecific but compatible with chronic small vessel ischemic disease. No demarcated cortical infarct. No extra-axial fluid collection. No evidence of intracranial mass. No midline shift. Vascular: No hyperdense vessel.  Atherosclerotic calcifications Skull: Normal. Negative for fracture or focal lesion. Other: Forehead hematoma and laceration eccentric to the left. CT MAXILLOFACIAL FINDINGS Osseous: Mildly displaced bilateral nasal bone fractures, unchanged as compared to the maxillofacial CT of 09/25/2020. No interval acute maxillofacial fracture is identified Orbits: No acute finding within the orbits. The globes are normal in size and contour. The extraocular muscles and optic nerve sheath complexes are symmetric and unremarkable. Sinuses: Trace bilateral ethmoid sinus mucosal thickening. Soft tissues: Forehead hematoma and laceration eccentric to the left. Additional left periorbital and left cheek hematoma. CT  CERVICAL SPINE FINDINGS Alignment: Straightening of the expected cervical lordosis. 2 mm C3-C4, C4-C5, C5-C6, C7-T1, T1-T2 and T3-T4 grade 1 anterolisthesis, chronic and unchanged. Partially imaged thoracic levocurvature. Skull base and vertebrae: The basion-dental and atlanto-dental intervals are maintained.No evidence of acute fracture to the cervical spine. Soft tissues and spinal canal: No prevertebral fluid or swelling. No visible canal hematoma. Disc levels: Cervical spondylosis with multilevel disc space narrowing, disc bulges, endplate spurring, uncovertebral hypertrophy and facet arthrosis. Disc space narrowing is advanced at C3-C4, C4-C5, C5-C6, C6-C7 and T2-T3. No appreciable high-grade spinal canal stenosis. Multilevel bony neural foraminal narrowing. Upper chest: Unchanged opacity within the medial left lung apex, likely reflecting scarring. No visible pneumothorax. These results were called by telephone at the time of interpretation on 11/02/2020 at 9:08 pm to provider [facility] , who verbally acknowledged these results. IMPRESSION: CT head: 1. Acute extra-axial hematoma within the inferolateral aspect of the left middle cranial fossa, measuring up to 3 mm in thickness. 2. Trace acute subdural hematoma is also suspected along the left tentorium. 3. Forehead hematoma and laceration eccentric to the left. 4. Mild generalized parenchymal atrophy and moderate cerebral white  matter chronic small vessel ischemic disease, stable as compared to the head CT of 09/25/2020. CT maxillofacial: 1. Mildly displaced bilateral nasal bone fractures, unchanged as compared to the maxillofacial CT of 09/25/2020. 2. No interval acute maxillofacial fracture is identified. 3. Left forehead hematoma and laceration, eccentric to the left. 4. Additional left periorbital and left cheek hematoma. CT cervical spine: 1. No evidence of acute fracture to the cervical spine. 2. Nonspecific straightening of the expected cervical  lordosis. 3. 2 mm C3-C4, C4-C5, C5-C6, C7-T1, T1-T2 and T3-T4 grade 1 anterolisthesis, chronic and unchanged as compared to the prior exam of 09/25/2020. 4. Cervical spondylosis, as described. Electronically Signed   By: Kellie Simmering DO   On: 11/02/2020 21:08     Assessment/Plan Principal Problem:   Subdural hematoma ([place]) Active Problems:   Essential hypertension   Type 2 diabetes mellitus with stage 2 chronic kidney disease, without long-term current use of insulin (HCC)   Non Hodgkin's lymphoma ([place])   CKD stage 2 due to type 2 diabetes mellitus (HCC)   Laceration of forehead    1. Acute extra axial hematoma and subdural hematoma for which the ER physician had discussed with on-call neurosurgeon Dr. Ronnald Ramp recommended getting a repeat CT head in 12 hours.  We will continue to monitor.  Patient takes aspirin which we will hold. 2. Diabetes mellitus type 2 we will keep patient on sliding scale coverage. 3. Laceration of the forehead which has been sutured.  Sutures need to be removed at least 1 week.  Patient did receive Tdap. 4. Elevated blood pressure readings we will keep patient n.p.o. and IV hydralazine and follow blood pressure trends. 5. Hyperlipidemia on statins. 6. Depression on fluoxetine. 7. Chronic anemia follow CBC.  EKG is pending.   DVT prophylaxis: SCDs.  Avoiding anticoagulation since patient has intracranial bleed. Code Status: Full code. Family Communication: Discussed with patient. Disposition Plan: To be determined. Consults called: ER physician discussed with neurosurgeon.  Physical therapy. Admission status: Observation.   Rise Patience MD [facility] Pager [phone].  If 7PM-7AM, please contact night-coverage [email or web address] Password [facility]  11/03/2020, 12:34 AM

## 2020-11-03 NOTE — Progress Notes (Addendum)
Patient assisted to chair with 2 assists.  Able to march in place, but very hesitant to transfer with only one assist.  Independent with bed mobility.  States she lives alone and "I fall a lot".  States she does not feel dizzy, but that her "legs just give out".  Has generalized weakness.  Chair alarm and call light in place.  Chair locked and legs elevated as per patient.  Continues to complain of nausea despite Zofran being given earlier on previous shift for emesis x 2.  Refuses po at this time.

## 2020-11-03 NOTE — Evaluation (Signed)
Physical Therapy Evaluation Patient Details Name: Debra Short MRN: [id number] DOB: 1941/04/06 Today's Date: 11/03/2020   History of Present Illness  80 y.o. female presents after a fall at home 11/02/20 where she hit her head with laceration on her forehead. CT head shows acute extra-axial hematoma of the inferolateral aspect of the left middle cranial fossa measuring up to 3 mm and trace subdural hematoma along the left side Stable on repeat CT 5/24 PMH:hx of falling, diabetes mellitus type 2, hyperlipidemia, chronic anemia previous history of lymphoma.  Clinical Impression  PTA pt living alone in single story home with 2 steps to enter. Pt reports independence with mobility without AD, bADLs and iADLs, still driving. Pt report 13 falls in last 2 years with pattern of falling anteriorly to L. Pt is limited in safe mobility by decreased safety awareness in terms of not being aware of what is causing falls. Pt also exhibits decreased strength and balance deficits. Pt is currently min guard for transfer and minA to min guard for ambulation without AD. Pt reports increased furniture in home from down size in home and throw rugs present and that possibly clearing out would make house safer. PT recommended that pt utilize her RW for improved steadiness with gait. PT recommending HHPT for safety check and for HEP for strengthening and balance. PT will continue to follow pt acutely.    Follow Up Recommendations Home health PT;Supervision - Intermittent    Equipment Recommendations  Other (comment) (has necessary equipment)       Precautions / Restrictions Precautions Precautions: Fall Precaution Comments: 13 falls over last 2 years Restrictions Weight Bearing Restrictions: No      Mobility  Bed Mobility               General bed mobility comments: OOB in recliner    Transfers Overall transfer level: Needs assistance   Transfers: Sit to/from Stand Sit to Stand: Min guard          General transfer comment: min guard for safety  Ambulation/Gait Ambulation/Gait assistance: Min assist;Min guard Gait Distance (Feet): 80 Feet Assistive device: None Gait Pattern/deviations: Step-through pattern;Decreased step length - right;Decreased step length - left;Shuffle;Staggering left;Staggering right Gait velocity: slowed Gait velocity interpretation: <1.8 ft/sec, indicate of risk for recurrent falls General Gait Details: contact guard assist to begin ambulation, step through gait with mild balance deficits, able to progress to min guard assist      Balance Overall balance assessment: Mild deficits observed, not formally tested;History of Falls (pt reports 13 falls in last 2 years, falls mainly forward and to the L, multiple L sided injuries)                                           Pertinent Vitals/Pain Pain Assessment: Faces Faces Pain Scale: Hurts little more Pain Location: face an L neck and upper back Pain Descriptors / Indicators: Grimacing;Guarding;Aching;Sore Pain Intervention(s): Limited activity within patient's tolerance;Monitored during session;Repositioned;RN gave pain meds during session    [place] expects to be discharged to:: Private residence Living Arrangements: Alone Available Help at Discharge: Family;Friend(s);Available PRN/intermittently Type of Home: House Home Access: Stairs to enter Entrance Stairs-Rails: None Entrance Stairs-Number of Steps: 2 Home Layout: One level Home Equipment: Walker - 2 wheels;Shower seat;Grab bars - toilet;Grab bars - tub/shower;Hand held shower head      Prior Function Level of  Independence: Independent         Comments: Still drives        Extremity/Trunk Assessment   Upper Extremity Assessment Upper Extremity Assessment: Generalized weakness    Lower Extremity Assessment Lower Extremity Assessment: RLE deficits/detail;LLE deficits/detail RLE Deficits /  Details: ROM WFL, hip flex 4/5, knee ext 4/5, knee flex 3/5, plantar/dorsi 4/5 RLE Sensation: WNL RLE Coordination: WNL LLE Deficits / Details: ROM WFL, hip flex 4/5, knee ext 4/5, knee flex 3/5, plantar/dorsi 4/5 LLE Sensation: WNL LLE Coordination: WNL    Cervical / Trunk Assessment Cervical / Trunk Assessment: Kyphotic  Communication   Communication: No difficulties  Cognition Arousal/Alertness: Awake/alert Behavior During Therapy: WFL for tasks assessed/performed Overall Cognitive Status: Impaired/Different from baseline Area of Impairment: Safety/judgement                         Safety/Judgement: Decreased awareness of safety     General Comments: pt unable to recall what happens before falls, can not pinpoint cause of falls      General Comments General comments (skin integrity, edema, etc.): VSS on RA, multiple sutures to L side of face    Exercises General Exercises - Lower Extremity Long Arc Quad: AROM;Both;10 reps;Seated Hip ABduction/ADduction: AROM;Both;10 reps;Seated Hip Flexion/Marching: AROM;Both;10 reps;Seated Toe Raises: AROM;Both;10 reps;Seated Heel Raises: AROM;Both;10 reps;Seated   Assessment/Plan    PT Assessment Patient needs continued PT services  PT Problem List Decreased balance;Decreased mobility;Decreased cognition;Decreased safety awareness;Pain       PT Treatment Interventions DME instruction;Gait training;Stair training;Functional mobility training;Therapeutic activities;Therapeutic exercise;Balance training;Cognitive remediation;Patient/family education    PT Goals (Current goals can be found in the Care Plan section)  Acute Rehab PT Goals Patient Stated Goal: go home PT Goal Formulation: With patient Time For Goal Achievement: 11/17/20 Potential to Achieve Goals: Fair    Frequency Min 3X/week   Barriers to discharge Decreased caregiver support         AM-PAC PT "6 Clicks" Mobility  Outcome Measure Help needed  turning from your back to your side while in a flat bed without using bedrails?: None Help needed moving from lying on your back to sitting on the side of a flat bed without using bedrails?: None Help needed moving to and from a bed to a chair (including a wheelchair)?: None Help needed standing up from a chair using your arms (e.g., wheelchair or bedside chair)?: None Help needed to walk in hospital room?: None Help needed climbing 3-5 steps with a railing? : A Little 6 Click Score: 23    End of Session Equipment Utilized During Treatment: Gait belt Activity Tolerance: Patient tolerated treatment well Patient left: in chair;with call bell/phone within reach;with chair alarm set Nurse Communication: Mobility status PT Visit Diagnosis: Unsteadiness on feet (R26.81);Repeated falls (R29.6);History of falling (Z91.81);Difficulty in walking, not elsewhere classified (R26.2)    Time: 6701-4103 PT Time Calculation (min) (ACUTE ONLY): 31 min   Charges:   PT Evaluation $PT Eval Moderate Complexity: 1 Mod PT Treatments $Therapeutic Exercise: 8-22 mins        Nasean Zapf B. Migdalia Dk PT, DPT Acute Rehabilitation Services Pager [phone] Office [phone]   [place] 11/03/2020, 11:11 AM

## 2020-11-03 NOTE — TOC Initial Note (Addendum)
Transition of Care [facility]) - Initial/Assessment Note    Patient Details  Name: Debra Short MRN: [id number] Date of Birth: 08-May-1941  [facility] of [facility]) CM/SW Contact:    Marilu Favre, RN Phone Number: 11/03/2020, 1:47 PM  Clinical Narrative:                 Spoke to patient at bedside. Confirmed face sheet information.  Discussed PT recommendations for HHPT and intermittent supervision.  Patient from home alone, however her neighbor is available to stay with her through the day and her son in there in the evenings.   Patient had walker, 3 in 1 and wheel chair however she gave it away. PT recommendation for  walker and 3 in 1 same ordered with [place] [facility].   Patient has no preference in home health agency . NCM calling agencies   [facility].   Expected Discharge Plan: [place]     Patient Goals and CMS Choice Patient states their goals for this hospitalization and ongoing recovery are:: to return to home CMS Medicare.gov Compare Post Acute Care list provided to:: Patient Choice offered to / list presented to : Patient  Expected Discharge Plan and Services Expected Discharge Plan: [place] Choice: [place] arrangements for the past 2 months: Single Family Home                 DME Arranged: 3-N-1,Walker rolling DME Agency: [employer] Date DME Agency Contacted: 11/03/20 Time DME Agency Contacted: [id number] Representative spoke with at DME Agency: Freda Munro HH Arranged: PT          Prior Living Arrangements/Services Living arrangements for the past 2 months: [place] Lives with:: Self Patient language and need for interpreter reviewed:: Yes Do you feel safe going back to the place where you live?: Yes      Need for Family Participation in Patient Care: Yes (Comment) Care giver support system in place?: Yes (comment)    Criminal Activity/Legal Involvement Pertinent to Current Situation/Hospitalization: No - Comment as needed  Activities of Daily Living Home Assistive Devices/Equipment: Eyeglasses,Cane (specify quad or straight) ADL Screening (condition at time of admission) Patient's cognitive ability adequate to safely complete daily activities?: Yes Is the patient deaf or have difficulty hearing?: No Does the patient have difficulty seeing, even when wearing glasses/contacts?: No Does the patient have difficulty concentrating, remembering, or making decisions?: Yes Patient able to express need for assistance with ADLs?: Yes Does the patient have difficulty dressing or bathing?: No Independently performs ADLs?: Yes (appropriate for developmental age) Does the patient have difficulty walking or climbing stairs?: No Weakness of Legs: Both Weakness of Arms/Hands: None  Permission Sought/Granted   Permission granted to share information with : No              Emotional Assessment Appearance:: Appears stated age Attitude/Demeanor/Rapport: Engaged Affect (typically observed): Accepting Orientation: : Oriented to Self,Oriented to Place,Oriented to  Time,Oriented to Situation Alcohol / Substance Use: Not Applicable Psych Involvement: No (comment)  Admission diagnosis:  Subdural hematoma ([place]) [S06.5X9A] Injury of head, initial encounter [S09.90XA] Fall, initial encounter [W19.XXXA] Laceration of forehead, initial encounter [S01.81XA] Patient Active Problem List   Diagnosis Date Noted  . Laceration of forehead 11/03/2020  . Subdural hematoma ([place]) 11/02/2020  . Nervous 02/29/2020  . Aortic atherosclerosis ([place]) by CXR in 2019 12/11/2019  . Osteopenia  11/26/2018  . OAB (overactive bladder) 05/14/2018  . BMI 24.0-24.9, adult 05/09/2018  . Depression, major, recurrent, in partial remission ([place]) 04/25/2017  . Degenerative disc disease, lumbar 07/20/2016  . Primary osteoarthritis of both knees  07/20/2016  . Fibromyalgia syndrome 07/20/2016  . CKD stage 2 due to type 2 diabetes mellitus ([place]) 10/04/2015  . Insomnia 10/06/2014  . Medication management 10/09/2013  . Hyperlipidemia associated with type 2 diabetes mellitus ([place])   . Essential hypertension   . Gastroesophageal reflux disease   . Vitamin D deficiency   . IBS (irritable bowel syndrome)   . Type 2 diabetes mellitus with stage 2 chronic kidney disease, without long-term current use of insulin ([place])   . Non Hodgkin's lymphoma ([place])    PCP:  Unk [name], MD Pharmacy:   [id number] - [place], [place] [address] [place] [address] Phone: [phone] Fax: [phone]  [place] [address] [place] - [address] [place] [place] [address] Phone: [phone] Fax: [phone]     Social Determinants of Health (SDOH) Interventions    Readmission Risk Interventions No flowsheet data found.

## 2020-11-03 NOTE — Progress Notes (Signed)
Patient placed in observation after midnight, please see H&P.  Here with a head injury after a fall.  Lives alone.  Per Dr. Ronnald Ramp, NS: a tiny extra axial hyperdensity maybe 56mm in the L inferotemporal region. No indication for neurosurgical intervention. If she is admitted would repeat the scan in the am, and let us know if there is any change. Should resolve without consequence and would treat as a mild TBI. -repeat head CT stable 5/24 AM -PT/OT eval pending

## 2020-11-04 ENCOUNTER — Telehealth: Payer: Self-pay | Admitting: *Deleted

## 2020-11-04 DIAGNOSIS — S065X9A Traumatic subdural hemorrhage with loss of consciousness of unspecified duration, initial encounter: Secondary | ICD-10-CM | POA: Diagnosis not present

## 2020-11-04 DIAGNOSIS — I10 Essential (primary) hypertension: Secondary | ICD-10-CM | POA: Diagnosis not present

## 2020-11-04 DIAGNOSIS — E1122 Type 2 diabetes mellitus with diabetic chronic kidney disease: Secondary | ICD-10-CM | POA: Diagnosis not present

## 2020-11-04 DIAGNOSIS — N182 Chronic kidney disease, stage 2 (mild): Secondary | ICD-10-CM

## 2020-11-04 LAB — GLUCOSE, CAPILLARY
Glucose-Capillary: 102 mg/dL — ABNORMAL HIGH (ref 70–99)
Glucose-Capillary: 117 mg/dL — ABNORMAL HIGH (ref 70–99)

## 2020-11-04 NOTE — Progress Notes (Addendum)
Discharge instructions reviewed and given to pt, pt and  friend/family at bedside acknowledge understanding. Reinforced importance of using walker for safety and fall risk. Reinforced importance of calling 911 for neurological changes cp or sob. Pt assures in home alternating support from son grandson and RN granddaughter will be available to her. Instructed on importance of sitting to dress and groom self to help reduce risk of falling. Pt discharged with DME walker and 3 in 1

## 2020-11-04 NOTE — Evaluation (Signed)
Occupational Therapy Evaluation Patient Details Name: Debra Short MRN: [id number] DOB: 1940-10-01 Today's Date: 11/04/2020    History of Present Illness 80 y.o. female presents after a fall at home 11/02/20 where she hit her head with laceration on her forehead. CT head shows acute extra-axial hematoma of the inferolateral aspect of the left middle cranial fossa measuring up to 3 mm and trace subdural hematoma along the left side Stable on repeat CT 5/24 PMH:hx of falling, diabetes mellitus type 2, hyperlipidemia, chronic anemia previous history of lymphoma.   Clinical Impression   Pt was functioning independently, but endorsed many fall with burning in B LEs. She reports her PCP prescribed gabapentin for neuropathy, but she does not take the full dose. Pt is overall functioning at a supervision level with RW strongly encouraged. Educated at length in fall prevention in the home and recommend [place] to address home safety and IADL with RW. Recommended medical alert system. Pt receptive to all information and agreeable to [facility].     Follow Up Recommendations  Home health OT    Equipment Recommendations  3 in 1 bedside commode    Recommendations for Other Services       Precautions / Restrictions Precautions Precautions: Fall Precaution Comments: 13 falls over last 2 years      Mobility Bed Mobility Overal bed mobility: Independent                  Transfers Overall transfer level: Needs assistance Equipment used: Rolling walker (2 wheeled) Transfers: Sit to/from Stand Sit to Stand: Supervision         General transfer comment: cues for hand placement    Balance                                           ADL either performed or assessed with clinical judgement   ADL Overall ADL's : Needs assistance/impaired Eating/Feeding: Independent   Grooming: Supervision/safety;Standing   Upper Body Bathing: Set up;Sitting   Lower Body Bathing:  Supervison/ safety;Sit to/from stand   Upper Body Dressing : Set up;Sitting   Lower Body Dressing: Supervision/safety;Sit to/from stand   Toilet Transfer: Supervision/safety;Ambulation;RW   Toileting- Clothing Manipulation and Hygiene: Supervision/safety;Sit to/from stand       Functional mobility during ADLs: Supervision/safety;Rolling walker General ADL Comments: Extensive education in fall prevention provided.     Vision Patient Visual Report: No change from baseline       Perception     Praxis      Pertinent Vitals/Pain Pain Assessment: 0-10 Pain Score: 8  Pain Location: LEs Pain Descriptors / Indicators: Burning Pain Intervention(s): Monitored during session;Repositioned     Hand Dominance Right   Extremity/Trunk Assessment Upper Extremity Assessment Upper Extremity Assessment: Overall WFL for tasks assessed   Lower Extremity Assessment Lower Extremity Assessment: Defer to PT evaluation   Cervical / Trunk Assessment Cervical / Trunk Assessment: Kyphotic   Communication Communication Communication: No difficulties   Cognition Arousal/Alertness: Awake/alert Behavior During Therapy: WFL for tasks assessed/performed Overall Cognitive Status: Within Functional Limits for tasks assessed                                     General Comments       Exercises     Shoulder Instructions  Home Living Family/patient expects to be discharged to:: Private residence Living Arrangements: Alone Available Help at Discharge: Family;Friend(s);Available PRN/intermittently Type of Home: House Home Access: Stairs to enter [employer] of Steps: 2 Entrance Stairs-Rails: None Home Layout: One level     Bathroom Shower/Tub: Occupational psychologist: Handicapped height     Home Equipment: Shower seat;Hand held shower head;Grab bars - toilet;Grab bars - tub/shower          Prior Functioning/Environment Level of Independence:  Independent        Comments: Still drives        OT Problem List: Impaired balance (sitting and/or standing)      OT Treatment/Interventions:      OT Goals(Current goals can be found in the care plan section) Acute Rehab OT Goals Patient Stated Goal: go home  OT Frequency:     Barriers to D/C:            Co-evaluation              AM-PAC OT "6 Clicks" Daily Activity     Outcome Measure Help from another person eating meals?: None Help from another person taking care of personal grooming?: A Little Help from another person toileting, which includes using toliet, bedpan, or urinal?: A Little Help from another person bathing (including washing, rinsing, drying)?: A Little Help from another person to put on and taking off regular upper body clothing?: None Help from another person to put on and taking off regular lower body clothing?: A Little 6 Click Score: 20   End of Session Equipment Utilized During Treatment: Rolling walker  Activity Tolerance: Patient tolerated treatment well Patient left: in chair;with call bell/phone within reach;with chair alarm set  OT Visit Diagnosis: Other abnormalities of gait and mobility (R26.89);Unsteadiness on feet (R26.81)                Time: 9458-5929 OT Time Calculation (min): 33 min Charges:  OT General Charges $OT Visit: 1 Visit OT Evaluation $OT Eval Low Complexity: 1 Low OT Treatments $Self Care/Home Management : 8-22 mins  Nestor Lewandowsky, OTR/L Acute Rehabilitation Services Pager: [phone] Office: [phone]  [place] So 11/04/2020, 9:14 AM

## 2020-11-04 NOTE — Discharge Summary (Signed)
Physician Discharge Summary  JAMILLE FISHER BDZ:[id number] DOB: [birth year] DOA: 11/02/2020  PCP: Unk [name], MD  Admit date: 11/02/2020 Discharge date: 11/04/2020  Admitted From: home Disposition:  home  Recommendations for Outpatient Follow-up:  1. Follow up with PCP in 1-2 weeks 2. Please obtain BMP/CBC in one week  Home Health: PT, OT Equipment/Devices: walker, DME 3 in 1  Discharge Condition: stable CODE STATUS: Full code Diet recommendation: regular  HPI: Per admitting MD, Debra Short is a 80 y.o. female with history of diabetes mellitus type 2, hyperlipidemia, chronic anemia previous history of lymphoma had a fall at home and hit her head.  Patient states she has been having frequent falls recently.  Does not recall the exact circumstances and what makes a fall.  Denies losing consciousness.  Denies any chest pain shortness of breath or palpitations.  Hospital Course / Discharge diagnoses: Principal problem Acute extra axial hematoma and subdural hematoma - ER physician had discussed with on-call neurosurgeon Dr. Ronnald Ramp recommended getting a repeat CT head, this was done on 5/24 and showed stability. PT evaluated patient and recommended HHPT/OT which will be arranged prior to her dc. She has not been using assistive devices consistently and has been having worsening LE neuropathy which is likely contributing to her falls.   Active problema LE neuropathy - recommended to  Increase the gabapentin to twice daily  HLD - continue home medications DM2 - continue home medications. A1C 5.5 Bladder incontinence - no significant UTI history. Continue vesicare Depression / Anxiety - continue home medications Laceration of the forehead -has been sutured.  Sutures need to be removed at least 1 week.  Patient did receive Tdap.  Sepsis ruled out   Discharge Instructions   Allergies as of 11/04/2020      Reactions   Effexor [venlafaxine] Other (See Comments)   Not  effective   Nortriptyline Other (See Comments)   Not effective   Other Other (See Comments)   Spicy foods cause heartburn   Paxil [paroxetine Hcl] Other (See Comments)   Not effective   Robaxin [methocarbamol] Itching   Tizanidine Itching   Zoloft [sertraline Hcl] Other (See Comments)   Not effective      Medication List    STOP taking these medications   aspirin EC 81 MG tablet     TAKE these medications   acetaminophen 650 MG CR tablet Commonly known as: TYLENOL Take 650 mg by mouth See admin instructions. Take 650 mg by mouth at bedtime and an additional 650 mg during the day as needed for pain   ALPRAZolam 1 MG tablet Commonly known as: XANAX TAKE 1/2 - 1 TABLET 2 - 3 X /DAY ONLY IF NEEDED FOR ANXIETY ATTACK & LIMIT TO 5 DAYS /WEEK TO AVOID ADDICTION & DEMENTIA What changed: See the new instructions.   atorvastatin 80 MG tablet Commonly known as: LIPITOR TAKE 1/3 TABLET BY MOUTH DAILY FOR CHOLESTEROL. DISCONTINUE PRAVASTATIN What changed:   how much to take  how to take this  when to take this  additional instructions   esomeprazole 40 MG capsule Commonly known as: NexIUM Take     1 capsule     Daily     to Prevent Heartburn & Indigestion What changed:   how much to take  how to take this  when to take this  reasons to take this  additional instructions   FLUoxetine 40 MG capsule Commonly known as: PROZAC TAKE 1 CAPSULE DAILY FOR MOOD What  changed: See the new instructions.   fluticasone 50 MCG/ACT nasal spray Commonly known as: FLONASE Place 1-2 sprays into both nostrils daily as needed for allergies or rhinitis.   gabapentin 300 MG capsule Commonly known as: NEURONTIN Take  2 capsules  2 x /day  for Pain What changed:   how much to take  how to take this  when to take this  additional instructions   metFORMIN 500 MG 24 hr tablet Commonly known as: GLUCOPHAGE-XR TAKE 2 TABLETS BY MOUTH 2 X /DAY WITH MEALS FOR DIABETES What  changed:   how much to take  how to take this  when to take this  additional instructions   montelukast 10 MG tablet Commonly known as: SINGULAIR TAKE 1 TABLET BY MOUTH DAILY FOR ALLERGIES What changed:   how much to take  how to take this  when to take this  reasons to take this  additional instructions   One-A-Day Proactive 65+ Tabs Take 1 tablet by mouth daily with breakfast.   ICaps Caps Take 1 capsule by mouth daily with lunch.   solifenacin 10 MG tablet Commonly known as: VESICARE TAKE 1 TABLET BY MOUTH ONCE DAILY FOR BLADDER What changed:   how much to take  how to take this  when to take this  additional instructions   vitamin C 500 MG tablet Commonly known as: ASCORBIC ACID Take 500 mg by mouth every 7 (seven) days.   VITAMIN D-3 PO Take 1-2 capsules by mouth daily.            Durable Medical Equipment  (From admission, onward)         Start     Ordered   11/03/20 1351  For home use only DME Walker rolling  Once       Question Answer Comment  Walker: With [place] Wheels   Patient needs a walker to treat with the following condition Weakness      11/03/20 1350   11/03/20 1351  For home use only DME 3 n 1  Once        11/03/20 1350          Follow-up Information    [facility] Follow up.   Specialty: Home Health Services Contact information: [address] [place]  [place] [phone]               Consultations:  Neurosurgery ER  Procedures/Studies:  CT HEAD WO CONTRAST  Result Date: 11/03/2020 CLINICAL DATA:  Follow-up subdural hematoma EXAM: CT HEAD WITHOUT CONTRAST TECHNIQUE: Contiguous axial images were obtained from the base of the skull through the vertex without intravenous contrast. COMPARISON:  CT from the previous day. FINDINGS: Brain: There is again noted a small extra-axial hematoma adjacent to the left temporal tip in the middle cranial fossa. Some prominence of the left  tentorium is again noted also consistent with extra-axial hematoma. The overall appearance is stable from the previous day. No new focal area of hemorrhage is noted. Mild atrophic changes and chronic white matter ischemic change is again identified and stable. Vascular: No hyperdense vessel or unexpected calcification. Skull: Normal. Negative for fracture or focal lesion. Sinuses/Orbits: No acute finding. Other: Soft tissue swelling along the left periorbital region and extending superiorly in the left frontal scalp is noted but slightly less prominent. Previous laceration appears to have been repaired. IMPRESSION: Stable appearing extra-axial hematomas along the left temporal tip and tentorium on the left similar to that  seen on the previous day. No new hemorrhage is seen. Chronic atrophic and ischemic changes as described. Left scalp swelling and apparent repair of previously seen laceration. Electronically Signed   By: Inez Catalina M.D.   On: 11/03/2020 08:57   CT Head Wo Contrast  Result Date: 11/02/2020 CLINICAL DATA:  Facial trauma.  Fall. EXAM: CT HEAD WITHOUT CONTRAST CT MAXILLOFACIAL WITHOUT CONTRAST CT CERVICAL SPINE WITHOUT CONTRAST TECHNIQUE: Multidetector CT imaging of the head, cervical spine, and maxillofacial structures were performed using the standard protocol without intravenous contrast. Multiplanar CT image reconstructions of the cervical spine and maxillofacial structures were also generated. COMPARISON:  CT head/maxillofacial/cervical spine 09/25/2020. FINDINGS: CT HEAD FINDINGS Brain: Mild generalized parenchymal atrophy. Thin acute extra-axial hematoma within the inferolateral aspect of the left middle cranial fossa (series 1, images 13 and 14) (series 6, image 47). This measures up to 3 mm in thickness. No significant mass effect upon the underlying left temporal lobe. Trace acute subdural hemorrhage is also suspected along the left tentorium (for instance as seen on series 5, image  46). Mild patchy and ill-defined hypoattenuation within the cerebral white matter, nonspecific but compatible with chronic small vessel ischemic disease. No demarcated cortical infarct. No extra-axial fluid collection. No evidence of intracranial mass. No midline shift. Vascular: No hyperdense vessel.  Atherosclerotic calcifications Skull: Normal. Negative for fracture or focal lesion. Other: Forehead hematoma and laceration eccentric to the left. CT MAXILLOFACIAL FINDINGS Osseous: Mildly displaced bilateral nasal bone fractures, unchanged as compared to the maxillofacial CT of 09/25/2020. No interval acute maxillofacial fracture is identified Orbits: No acute finding within the orbits. The globes are normal in size and contour. The extraocular muscles and optic nerve sheath complexes are symmetric and unremarkable. Sinuses: Trace bilateral ethmoid sinus mucosal thickening. Soft tissues: Forehead hematoma and laceration eccentric to the left. Additional left periorbital and left cheek hematoma. CT CERVICAL SPINE FINDINGS Alignment: Straightening of the expected cervical lordosis. 2 mm C3-C4, C4-C5, C5-C6, C7-T1, T1-T2 and T3-T4 grade 1 anterolisthesis, chronic and unchanged. Partially imaged thoracic levocurvature. Skull base and vertebrae: The basion-dental and atlanto-dental intervals are maintained.No evidence of acute fracture to the cervical spine. Soft tissues and spinal canal: No prevertebral fluid or swelling. No visible canal hematoma. Disc levels: Cervical spondylosis with multilevel disc space narrowing, disc bulges, endplate spurring, uncovertebral hypertrophy and facet arthrosis. Disc space narrowing is advanced at C3-C4, C4-C5, C5-C6, C6-C7 and T2-T3. No appreciable high-grade spinal canal stenosis. Multilevel bony neural foraminal narrowing. Upper chest: Unchanged opacity within the medial left lung apex, likely reflecting scarring. No visible pneumothorax. These results were called by telephone at  the time of interpretation on 11/02/2020 at 9:08 pm to provider [facility] , who verbally acknowledged these results. IMPRESSION: CT head: 1. Acute extra-axial hematoma within the inferolateral aspect of the left middle cranial fossa, measuring up to 3 mm in thickness. 2. Trace acute subdural hematoma is also suspected along the left tentorium. 3. Forehead hematoma and laceration eccentric to the left. 4. Mild generalized parenchymal atrophy and moderate cerebral white matter chronic small vessel ischemic disease, stable as compared to the head CT of 09/25/2020. CT maxillofacial: 1. Mildly displaced bilateral nasal bone fractures, unchanged as compared to the maxillofacial CT of 09/25/2020. 2. No interval acute maxillofacial fracture is identified. 3. Left forehead hematoma and laceration, eccentric to the left. 4. Additional left periorbital and left cheek hematoma. CT cervical spine: 1. No evidence of acute fracture to the cervical spine. 2. Nonspecific straightening of the expected  cervical lordosis. 3. 2 mm C3-C4, C4-C5, C5-C6, C7-T1, T1-T2 and T3-T4 grade 1 anterolisthesis, chronic and unchanged as compared to the prior exam of 09/25/2020. 4. Cervical spondylosis, as described. Electronically Signed   By: Kellie Simmering DO   On: 11/02/2020 21:08   CT Cervical Spine Wo Contrast  Result Date: 11/02/2020 CLINICAL DATA:  Facial trauma.  Fall. EXAM: CT HEAD WITHOUT CONTRAST CT MAXILLOFACIAL WITHOUT CONTRAST CT CERVICAL SPINE WITHOUT CONTRAST TECHNIQUE: Multidetector CT imaging of the head, cervical spine, and maxillofacial structures were performed using the standard protocol without intravenous contrast. Multiplanar CT image reconstructions of the cervical spine and maxillofacial structures were also generated. COMPARISON:  CT head/maxillofacial/cervical spine 09/25/2020. FINDINGS: CT HEAD FINDINGS Brain: Mild generalized parenchymal atrophy. Thin acute extra-axial hematoma within the inferolateral aspect of  the left middle cranial fossa (series 1, images 13 and 14) (series 6, image 47). This measures up to 3 mm in thickness. No significant mass effect upon the underlying left temporal lobe. Trace acute subdural hemorrhage is also suspected along the left tentorium (for instance as seen on series 5, image 46). Mild patchy and ill-defined hypoattenuation within the cerebral white matter, nonspecific but compatible with chronic small vessel ischemic disease. No demarcated cortical infarct. No extra-axial fluid collection. No evidence of intracranial mass. No midline shift. Vascular: No hyperdense vessel.  Atherosclerotic calcifications Skull: Normal. Negative for fracture or focal lesion. Other: Forehead hematoma and laceration eccentric to the left. CT MAXILLOFACIAL FINDINGS Osseous: Mildly displaced bilateral nasal bone fractures, unchanged as compared to the maxillofacial CT of 09/25/2020. No interval acute maxillofacial fracture is identified Orbits: No acute finding within the orbits. The globes are normal in size and contour. The extraocular muscles and optic nerve sheath complexes are symmetric and unremarkable. Sinuses: Trace bilateral ethmoid sinus mucosal thickening. Soft tissues: Forehead hematoma and laceration eccentric to the left. Additional left periorbital and left cheek hematoma. CT CERVICAL SPINE FINDINGS Alignment: Straightening of the expected cervical lordosis. 2 mm C3-C4, C4-C5, C5-C6, C7-T1, T1-T2 and T3-T4 grade 1 anterolisthesis, chronic and unchanged. Partially imaged thoracic levocurvature. Skull base and vertebrae: The basion-dental and atlanto-dental intervals are maintained.No evidence of acute fracture to the cervical spine. Soft tissues and spinal canal: No prevertebral fluid or swelling. No visible canal hematoma. Disc levels: Cervical spondylosis with multilevel disc space narrowing, disc bulges, endplate spurring, uncovertebral hypertrophy and facet arthrosis. Disc space narrowing is  advanced at C3-C4, C4-C5, C5-C6, C6-C7 and T2-T3. No appreciable high-grade spinal canal stenosis. Multilevel bony neural foraminal narrowing. Upper chest: Unchanged opacity within the medial left lung apex, likely reflecting scarring. No visible pneumothorax. These results were called by telephone at the time of interpretation on 11/02/2020 at 9:08 pm to provider [facility] , who verbally acknowledged these results. IMPRESSION: CT head: 1. Acute extra-axial hematoma within the inferolateral aspect of the left middle cranial fossa, measuring up to 3 mm in thickness. 2. Trace acute subdural hematoma is also suspected along the left tentorium. 3. Forehead hematoma and laceration eccentric to the left. 4. Mild generalized parenchymal atrophy and moderate cerebral white matter chronic small vessel ischemic disease, stable as compared to the head CT of 09/25/2020. CT maxillofacial: 1. Mildly displaced bilateral nasal bone fractures, unchanged as compared to the maxillofacial CT of 09/25/2020. 2. No interval acute maxillofacial fracture is identified. 3. Left forehead hematoma and laceration, eccentric to the left. 4. Additional left periorbital and left cheek hematoma. CT cervical spine: 1. No evidence of acute fracture to the cervical spine. 2.  Nonspecific straightening of the expected cervical lordosis. 3. 2 mm C3-C4, C4-C5, C5-C6, C7-T1, T1-T2 and T3-T4 grade 1 anterolisthesis, chronic and unchanged as compared to the prior exam of 09/25/2020. 4. Cervical spondylosis, as described. Electronically Signed   By: Kellie Simmering DO   On: 11/02/2020 21:08   CT Maxillofacial WO CM  Result Date: 11/02/2020 CLINICAL DATA:  Facial trauma.  Fall. EXAM: CT HEAD WITHOUT CONTRAST CT MAXILLOFACIAL WITHOUT CONTRAST CT CERVICAL SPINE WITHOUT CONTRAST TECHNIQUE: Multidetector CT imaging of the head, cervical spine, and maxillofacial structures were performed using the standard protocol without intravenous contrast. Multiplanar CT  image reconstructions of the cervical spine and maxillofacial structures were also generated. COMPARISON:  CT head/maxillofacial/cervical spine 09/25/2020. FINDINGS: CT HEAD FINDINGS Brain: Mild generalized parenchymal atrophy. Thin acute extra-axial hematoma within the inferolateral aspect of the left middle cranial fossa (series 1, images 13 and 14) (series 6, image 47). This measures up to 3 mm in thickness. No significant mass effect upon the underlying left temporal lobe. Trace acute subdural hemorrhage is also suspected along the left tentorium (for instance as seen on series 5, image 46). Mild patchy and ill-defined hypoattenuation within the cerebral white matter, nonspecific but compatible with chronic small vessel ischemic disease. No demarcated cortical infarct. No extra-axial fluid collection. No evidence of intracranial mass. No midline shift. Vascular: No hyperdense vessel.  Atherosclerotic calcifications Skull: Normal. Negative for fracture or focal lesion. Other: Forehead hematoma and laceration eccentric to the left. CT MAXILLOFACIAL FINDINGS Osseous: Mildly displaced bilateral nasal bone fractures, unchanged as compared to the maxillofacial CT of 09/25/2020. No interval acute maxillofacial fracture is identified Orbits: No acute finding within the orbits. The globes are normal in size and contour. The extraocular muscles and optic nerve sheath complexes are symmetric and unremarkable. Sinuses: Trace bilateral ethmoid sinus mucosal thickening. Soft tissues: Forehead hematoma and laceration eccentric to the left. Additional left periorbital and left cheek hematoma. CT CERVICAL SPINE FINDINGS Alignment: Straightening of the expected cervical lordosis. 2 mm C3-C4, C4-C5, C5-C6, C7-T1, T1-T2 and T3-T4 grade 1 anterolisthesis, chronic and unchanged. Partially imaged thoracic levocurvature. Skull base and vertebrae: The basion-dental and atlanto-dental intervals are maintained.No evidence of acute  fracture to the cervical spine. Soft tissues and spinal canal: No prevertebral fluid or swelling. No visible canal hematoma. Disc levels: Cervical spondylosis with multilevel disc space narrowing, disc bulges, endplate spurring, uncovertebral hypertrophy and facet arthrosis. Disc space narrowing is advanced at C3-C4, C4-C5, C5-C6, C6-C7 and T2-T3. No appreciable high-grade spinal canal stenosis. Multilevel bony neural foraminal narrowing. Upper chest: Unchanged opacity within the medial left lung apex, likely reflecting scarring. No visible pneumothorax. These results were called by telephone at the time of interpretation on 11/02/2020 at 9:08 pm to provider [facility] , who verbally acknowledged these results. IMPRESSION: CT head: 1. Acute extra-axial hematoma within the inferolateral aspect of the left middle cranial fossa, measuring up to 3 mm in thickness. 2. Trace acute subdural hematoma is also suspected along the left tentorium. 3. Forehead hematoma and laceration eccentric to the left. 4. Mild generalized parenchymal atrophy and moderate cerebral white matter chronic small vessel ischemic disease, stable as compared to the head CT of 09/25/2020. CT maxillofacial: 1. Mildly displaced bilateral nasal bone fractures, unchanged as compared to the maxillofacial CT of 09/25/2020. 2. No interval acute maxillofacial fracture is identified. 3. Left forehead hematoma and laceration, eccentric to the left. 4. Additional left periorbital and left cheek hematoma. CT cervical spine: 1. No evidence of acute fracture to  the cervical spine. 2. Nonspecific straightening of the expected cervical lordosis. 3. 2 mm C3-C4, C4-C5, C5-C6, C7-T1, T1-T2 and T3-T4 grade 1 anterolisthesis, chronic and unchanged as compared to the prior exam of 09/25/2020. 4. Cervical spondylosis, as described. Electronically Signed   By: Kellie Simmering DO   On: 11/02/2020 21:08      Subjective: - no chest pain, shortness of breath, no abdominal  pain, nausea or vomiting.   Discharge Exam: BP (!) 148/69 (BP Location: Left Arm)   Pulse 80   Temp 97.7 F (36.5 C) (Oral)   Resp 18   Ht 5\' 5"  (1.651 m)   Wt 68.6 kg   SpO2 96%   BMI 25.17 kg/m   General: Pt is alert, awake, not in acute distress Cardiovascular: RRR, S1/S2 +, no rubs, no gallops Respiratory: CTA bilaterally, no wheezing, no rhonchi Abdominal: Soft, NT, ND, bowel sounds + Extremities: no edema, no cyanosis    The results of significant diagnostics from this hospitalization (including imaging, microbiology, ancillary and laboratory) are listed below for reference.     Microbiology: Recent Results (from the past 240 hour(s))  Resp Panel by RT-PCR (Flu A&B, Covid) Nasopharyngeal Swab     Status: None   Collection Time: 11/02/20  9:26 PM   Specimen: Nasopharyngeal Swab; Nasopharyngeal(NP) swabs in vial transport medium  Result Value Ref Range Status   SARS Coronavirus 2 by RT PCR NEGATIVE NEGATIVE Final    Comment: (NOTE) SARS-CoV-2 target nucleic acids are NOT DETECTED.  The SARS-CoV-2 RNA is generally detectable in upper respiratory specimens during the acute phase of infection. The lowest concentration of SARS-CoV-2 viral copies this assay can detect is 138 copies/mL. A negative result does not preclude SARS-Cov-2 infection and should not be used as the sole basis for treatment or other patient management decisions. A negative result may occur with  improper specimen collection/handling, submission of specimen other than nasopharyngeal swab, presence of viral mutation(s) within the areas targeted by this assay, and inadequate number of viral copies(<138 copies/mL). A negative result must be combined with clinical observations, patient history, and epidemiological information. The expected result is Negative.  Fact Sheet for Patients:  EntrepreneurPulse.com.au  Fact Sheet for Healthcare Providers:   IncredibleEmployment.be  This test is no t yet approved or cleared by the [place] FDA and  has been authorized for detection and/or diagnosis of SARS-CoV-2 by FDA under an Emergency Use Authorization (EUA). This EUA will remain  in effect (meaning this test can be used) for the duration of the COVID-19 declaration under Section 564(b)(1) of the Act, 21 U.S.C.section 360bbb-3(b)(1), unless the authorization is terminated  or revoked sooner.       Influenza A by PCR NEGATIVE NEGATIVE Final   Influenza B by PCR NEGATIVE NEGATIVE Final    Comment: (NOTE) The Xpert Xpress SARS-CoV-2/FLU/RSV plus assay is intended as an aid in the diagnosis of influenza from Nasopharyngeal swab specimens and should not be used as a sole basis for treatment. Nasal washings and aspirates are unacceptable for Xpert Xpress SARS-CoV-2/FLU/RSV testing.  Fact Sheet for Patients: EntrepreneurPulse.com.au  Fact Sheet for Healthcare Providers: IncredibleEmployment.be  This test is not yet approved or cleared by the [place] FDA and has been authorized for detection and/or diagnosis of SARS-CoV-2 by FDA under an Emergency Use Authorization (EUA). This EUA will remain in effect (meaning this test can be used) for the duration of the COVID-19 declaration under Section 564(b)(1) of the Act, 21 U.S.C. section 360bbb-3(b)(1), unless the  authorization is terminated or revoked.  Performed at [facility], [place] [address] [place], [place] [address]      Labs: Basic Metabolic Panel: Recent Labs  Lab 11/02/20 2125 11/03/20 0259  NA 136 138  K 4.1 4.3  CL 102 101  CO2 27 29  GLUCOSE 104* 106*  BUN 15 13  CREATININE 0.87 0.84  CALCIUM 9.1 9.3   Liver Function Tests: No results for input(s): AST, ALT, ALKPHOS, BILITOT, PROT, ALBUMIN in the last 168 hours. CBC: Recent Labs  Lab 11/02/20 2125 11/03/20 0259  WBC 11.6* 13.2*   NEUTROABS 9.4*  --   HGB 11.9* 11.8*  HCT 36.6 35.7*  MCV 95.1 93.0  PLT 269 263   CBG: Recent Labs  Lab 11/03/20 0800 11/03/20 1108 11/03/20 1631 11/03/20 2130 11/04/20 0615  GLUCAP 148* 137* 95 101* 102*   Hgb A1c No results for input(s): HGBA1C in the last 72 hours. Lipid Profile No results for input(s): CHOL, HDL, LDLCALC, TRIG, CHOLHDL, LDLDIRECT in the last 72 hours. Thyroid function studies No results for input(s): TSH, T4TOTAL, T3FREE, THYROIDAB in the last 72 hours.  Invalid input(s): FREET3 Urinalysis    Component Value Date/Time   COLORURINE DARK YELLOW 09/16/2020 [place] 09/16/2020 1544   LABSPEC 1.024 09/16/2020 1544   PHURINE 7.0 09/16/2020 1544   GLUCOSEU NEGATIVE 09/16/2020 1544   HGBUR NEGATIVE 09/16/2020 1544   [place] 03/25/2017 1831   KETONESUR NEGATIVE 09/16/2020 1544   PROTEINUR TRACE (A) 09/16/2020 1544   UROBILINOGEN 1.0 10/31/2014 0238   NITRITE NEGATIVE 09/16/2020 1544   LEUKOCYTESUR 2+ (A) 09/16/2020 1544    FURTHER DISCHARGE INSTRUCTIONS:   Get Medicines reviewed and adjusted: Please take all your medications with you for your next visit with your Primary MD   Laboratory/radiological data: Please request your Primary MD to go over all hospital tests and procedure/radiological results at the follow up, please ask your Primary MD to get all Hospital records sent to his/her office.   In some cases, they will be blood work, cultures and biopsy results pending at the time of your discharge. Please request that your primary care M.D. goes through all the records of your hospital data and follows up on these results.   Also Note the following: If you experience worsening of your admission symptoms, develop shortness of breath, life threatening emergency, suicidal or homicidal thoughts you must seek medical attention immediately by calling 911 or calling your MD immediately  if symptoms less severe.   You must  read complete instructions/literature along with all the possible adverse reactions/side effects for all the Medicines you take and that have been prescribed to you. Take any new Medicines after you have completely understood and accpet all the possible adverse reactions/side effects.    Do not drive when taking Pain medications or sleeping medications (Benzodaizepines)   Do not take more than prescribed Pain, Sleep and Anxiety Medications. It is not advisable to combine anxiety,sleep and pain medications without talking with your primary care practitioner   Special Instructions: If you have smoked or chewed Tobacco  in the last 2 yrs please stop smoking, stop any regular Alcohol  and or any Recreational drug use.   Wear Seat belts while driving.   Please note: You were cared for by a hospitalist during your hospital stay. Once you are discharged, your primary care physician will handle any further medical issues. Please note that NO REFILLS for any discharge medications will be  authorized once you are discharged, as it is imperative that you return to your primary care physician (or establish a relationship with a primary care physician if you do not have one) for your post hospital discharge needs so that they can reassess your need for medications and monitor your lab values.  Time coordinating discharge: 35 minutes  SIGNED:  Marzetta Board, MD, PhD 11/04/2020, 9:22 AM

## 2020-11-04 NOTE — Telephone Encounter (Signed)
I left a message for the patient to return my call.

## 2020-11-04 NOTE — TOC CAGE-AID Note (Signed)
Transition of Care [facility]) - CAGE-AID Screening   Patient Details  Name: Debra Short MRN: [id number] Date of Birth: 06-17-1940  Clinical Narrative:  Patient denies any alcohol or drug use.  CAGE-AID Screening:    Have You Ever Felt You Ought to Cut Down on Your Drinking or Drug Use?: No Have People Annoyed You By Critizing Your Drinking Or Drug Use?: No Have You Felt Bad Or Guilty About Your Drinking Or Drug Use?: No Have You Ever Had a Drink or Used Drugs First Thing In The Morning to Steady Your Nerves or to Get Rid of a Hangover?: No CAGE-AID Score: 0  Substance Abuse Education Offered: No

## 2020-11-10 ENCOUNTER — Emergency Department (HOSPITAL_COMMUNITY)
Admission: EM | Admit: 2020-11-10 | Discharge: 2020-11-11 | Disposition: A | Discharge status: Home / Self Care | Payer: PPO | Attending: Emergency Medicine | Admitting: Emergency Medicine

## 2020-11-10 ENCOUNTER — Encounter (HOSPITAL_COMMUNITY): Payer: Self-pay | Admitting: Emergency Medicine

## 2020-11-10 ENCOUNTER — Other Ambulatory Visit: Payer: Self-pay

## 2020-11-10 DIAGNOSIS — R531 Weakness: Secondary | ICD-10-CM | POA: Diagnosis not present

## 2020-11-10 DIAGNOSIS — Z9181 History of falling: Secondary | ICD-10-CM | POA: Insufficient documentation

## 2020-11-10 DIAGNOSIS — N182 Chronic kidney disease, stage 2 (mild): Secondary | ICD-10-CM | POA: Diagnosis not present

## 2020-11-10 DIAGNOSIS — Z7984 Long term (current) use of oral hypoglycemic drugs: Secondary | ICD-10-CM | POA: Diagnosis not present

## 2020-11-10 DIAGNOSIS — I10 Essential (primary) hypertension: Secondary | ICD-10-CM | POA: Diagnosis not present

## 2020-11-10 DIAGNOSIS — E1122 Type 2 diabetes mellitus with diabetic chronic kidney disease: Secondary | ICD-10-CM | POA: Insufficient documentation

## 2020-11-10 DIAGNOSIS — E161 Other hypoglycemia: Secondary | ICD-10-CM | POA: Diagnosis not present

## 2020-11-10 DIAGNOSIS — W19XXXA Unspecified fall, initial encounter: Secondary | ICD-10-CM | POA: Diagnosis not present

## 2020-11-10 DIAGNOSIS — E162 Hypoglycemia, unspecified: Secondary | ICD-10-CM | POA: Diagnosis not present

## 2020-11-10 DIAGNOSIS — I129 Hypertensive chronic kidney disease with stage 1 through stage 4 chronic kidney disease, or unspecified chronic kidney disease: Secondary | ICD-10-CM | POA: Insufficient documentation

## 2020-11-10 LAB — CBC WITH DIFFERENTIAL/PLATELET
Abs Immature Granulocytes: 0.03 10*3/uL (ref 0.00–0.07)
Basophils Absolute: 0.1 10*3/uL (ref 0.0–0.1)
Basophils Relative: 1 %
Eosinophils Absolute: 0 10*3/uL (ref 0.0–0.5)
Eosinophils Relative: 0 %
HCT: 42 % (ref 36.0–46.0)
Hemoglobin: 14 g/dL (ref 12.0–15.0)
Immature Granulocytes: 0 %
Lymphocytes Relative: 15 %
Lymphs Abs: 1.3 10*3/uL (ref 0.7–4.0)
MCH: 30.6 pg (ref 26.0–34.0)
MCHC: 33.3 g/dL (ref 30.0–36.0)
MCV: 91.7 fL (ref 80.0–100.0)
Monocytes Absolute: 0.8 10*3/uL (ref 0.1–1.0)
Monocytes Relative: 9 %
Neutro Abs: 7 10*3/uL (ref 1.7–7.7)
Neutrophils Relative %: 75 %
Platelets: 375 10*3/uL (ref 150–400)
RBC: 4.58 MIL/uL (ref 3.87–5.11)
RDW: 12.4 % (ref 11.5–15.5)
WBC: 9.3 10*3/uL (ref 4.0–10.5)
nRBC: 0 % (ref 0.0–0.2)

## 2020-11-10 LAB — COMPREHENSIVE METABOLIC PANEL
ALT: 19 U/L (ref 0–44)
AST: 20 U/L (ref 15–41)
Albumin: 4.2 g/dL (ref 3.5–5.0)
Alkaline Phosphatase: 62 U/L (ref 38–126)
Anion gap: 15 (ref 5–15)
BUN: 29 mg/dL — ABNORMAL HIGH (ref 8–23)
CO2: 24 mmol/L (ref 22–32)
Calcium: 9.8 mg/dL (ref 8.9–10.3)
Chloride: 100 mmol/L (ref 98–111)
Creatinine, Ser: 0.52 mg/dL (ref 0.44–1.00)
GFR, Estimated: >60 mL/min (ref >60)
Glucose, Bld: 78 mg/dL (ref 70–99)
Potassium: 3.4 mmol/L — ABNORMAL LOW (ref 3.5–5.1)
Sodium: 139 mmol/L (ref 135–145)
Total Bilirubin: 2.1 mg/dL — ABNORMAL HIGH (ref 0.3–1.2)
Total Protein: 7.9 g/dL (ref 6.5–8.1)

## 2020-11-10 LAB — URINALYSIS, ROUTINE W REFLEX MICROSCOPIC
Bacteria, UA: NONE SEEN
Bilirubin Urine: NEGATIVE
Glucose, UA: 50 mg/dL — AB
Ketones, ur: 80 mg/dL — AB
Leukocytes,Ua: NEGATIVE
Nitrite: NEGATIVE
Protein, ur: 30 mg/dL — AB
Specific Gravity, Urine: 1.024 (ref 1.005–1.030)
pH: 5 (ref 5.0–8.0)

## 2020-11-10 MED ORDER — SODIUM CHLORIDE 0.9 % IV BOLUS
500.0000 mL | Freq: Once | INTRAVENOUS | Status: AC
  Administered 2020-11-10: 500 mL via INTRAVENOUS

## 2020-11-10 MED ORDER — GABAPENTIN 300 MG PO CAPS
600.0000 mg | ORAL_CAPSULE | Freq: Two times a day (BID) | ORAL | Status: DC
  Administered 2020-11-11 (×2): 600 mg via ORAL
  Filled 2020-11-10 (×2): qty 2

## 2020-11-10 MED ORDER — GABAPENTIN 300 MG PO CAPS: 300.0000 mg | ORAL_CAPSULE | ORAL | Status: DC

## 2020-11-10 MED ORDER — DARIFENACIN HYDROBROMIDE ER 7.5 MG PO TB24
7.5000 mg | ORAL_TABLET | Freq: Every day | ORAL | Status: DC
  Administered 2020-11-11: 7.5 mg via ORAL
  Filled 2020-11-10: qty 1

## 2020-11-10 MED ORDER — METFORMIN HCL ER 500 MG PO TB24
1000.0000 mg | ORAL_TABLET | Freq: Two times a day (BID) | ORAL | Status: DC
  Administered 2020-11-11: 1000 mg via ORAL
  Filled 2020-11-10 (×3): qty 2

## 2020-11-10 MED ORDER — ACETAMINOPHEN 325 MG PO TABS: 650.0000 mg | ORAL_TABLET | Freq: Three times a day (TID) | ORAL | Status: DC | PRN

## 2020-11-10 MED ORDER — PANTOPRAZOLE SODIUM 40 MG PO TBEC
40.0000 mg | DELAYED_RELEASE_TABLET | Freq: Every day | ORAL | Status: DC
  Administered 2020-11-11: 40 mg via ORAL
  Filled 2020-11-10: qty 1

## 2020-11-10 NOTE — ED Notes (Signed)
No urine collected with attempt to in and out cath. Bladder scan completed showing 21ml in bladder. Purewick applied to patient. Pt instructed to use call bell if she gets sensation to urinate and she verbalized understanding.

## 2020-11-10 NOTE — ED Provider Notes (Signed)
[facility] DEPT Provider Note   CSN: [id number] Arrival date & time: 11/10/20  1437     History Chief Complaint  Patient presents with  . Weakness    Debra Short is a 80 y.o. female presenting from home with generalized weakness.  The patient was discharged in the hospital proximately 7 days ago after having issues with recurrent falls at home, sustaining a small subdural hematoma.  She had stitches placed on 2 forehead lacerations.  She was evaluated by physical therapy and Occupational Therapy, who felt that she could be discharged home with assistance.  The patient reports that her strength was okay when she arrived home, but for the past 3 to 4 days she has been essentially bedbound.  She reports weakness in her legs giving out on her when she tries to stand.  She also reports lightheadedness when she stands.  A neighbor was visiting her today was concerned that she has been essentially bedbound for 4 days, not eating or drinking anything, and therefore called 911 to bring her to the ED.  The neighbor also spoke to the patient's son Debra Short , whom I spoke with as well.  He lives in [place] and was working today.  Since her discharge from the hospital, the patient denies that she has had any recurring falls or head injuries.  She denies headache.  She was unaware that her stitches needed to be removed.    She has not been taking any of her medications because "I couldn't get to them."  Lennette Bihari tells me that he offered to the patient to come stay with him and other family members after her last discharge, but she had refused at that time.  She preferred to be by herself alone.  She does not have any home health set up.  HPI     Past Medical History:  Diagnosis Date  . Allergy   . Diabetes mellitus without complication ([place])   . Fibromyalgia   . History of migraine 08/25/2016  . Hyperlipidemia   . IBS (irritable bowel syndrome)   . 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 Vitamin D deficiency     Patient Active Problem List   Diagnosis Date Noted  . Laceration of forehead 11/03/2020  . Subdural hematoma ([place]) 11/02/2020  . Nervous 02/29/2020  . Aortic atherosclerosis ([place]) by CXR in 2019 12/11/2019  . Osteopenia 11/26/2018  . OAB (overactive bladder) 05/14/2018  . BMI 24.0-24.9, adult 05/09/2018  . Depression, major, recurrent, in partial remission ([place]) 04/25/2017  . Degenerative disc disease, lumbar 07/20/2016  . Primary osteoarthritis of both knees 07/20/2016  . Fibromyalgia syndrome 07/20/2016  . CKD stage 2 due to type 2 diabetes mellitus ([place]) 10/04/2015  . Insomnia 10/06/2014  . Medication management 10/09/2013  . Hyperlipidemia associated with type 2 diabetes mellitus ([place])   . Essential hypertension   . Gastroesophageal reflux disease   . Vitamin D deficiency   . IBS (irritable bowel syndrome)   . Type 2 diabetes mellitus with stage 2 chronic kidney disease, without long-term current use of insulin ([place])   . Non Hodgkin's lymphoma [facility])     Past Surgical History:  Procedure Laterality Date  . ABDOMINAL HYSTERECTOMY    . ORIF ANKLE FRACTURE Left 09/26/2014  . ORIF ANKLE FRACTURE Left 09/26/2014   Procedure: OPEN REDUCTION INTERNAL FIXATION (ORIF) ANKLE FRACTURE;  Surgeon: [name], MD;  Location: [facility];  Service: Orthopedics;  Laterality: Left;  . TONSILLECTOMY  OB History   No obstetric history on file.     Family History  Problem Relation Age of Onset  . Cancer Mother   . Cancer Father   . Cancer Brother   . Cancer Maternal Grandfather     Social History   Tobacco Use  . Smoking status: Never Smoker  . Smokeless tobacco: Never Used  Vaping Use  . Vaping Use: Never used  Substance Use Topics  . Alcohol use: No  . Drug use: No    Home Medications Prior to Admission medications   Medication Sig Start Date End Date Taking? Authorizing Provider  acetaminophen (TYLENOL) 650 MG CR  tablet Take 650 mg by mouth See admin instructions. Take 650 mg by mouth at bedtime and an additional 650 mg during the day as needed for pain    [provider]  ALPRAZolam (XANAX) 1 MG tablet TAKE 1/2 - 1 TABLET 2 - 3 X /DAY ONLY IF NEEDED FOR ANXIETY ATTACK & LIMIT TO 5 DAYS /WEEK TO AVOID ADDICTION & DEMENTIA Patient taking differently: Take 0.5-1 mg by mouth See admin instructions. Take 0.5-1 mg by mouth two to to three times a day "only if needed for an anxiety attack and limit to 5 days a week to avoid addiction and dementia" 09/24/20   Liane Comber, NP  atorvastatin (LIPITOR) 80 MG tablet TAKE 1/3 TABLET BY MOUTH DAILY FOR CHOLESTEROL. DISCONTINUE PRAVASTATIN Patient taking differently: Take 26.7 mg by mouth daily. 02/25/20   Liane Comber, NP  Cholecalciferol (VITAMIN D-3 PO) Take 1-2 capsules by mouth daily.    [provider]  esomeprazole (NEXIUM) 40 MG capsule Take     1 capsule     Daily     to Prevent Heartburn & Indigestion Patient taking differently: Take 40 mg by mouth daily as needed (for heartburn or reflux). 03/13/20   Unk [name], MD  FLUoxetine (PROZAC) 40 MG capsule TAKE 1 CAPSULE DAILY FOR MOOD Patient taking differently: Take 40 mg by mouth daily in the afternoon. 02/11/20   Liane Comber, NP  fluticasone (FLONASE) 50 MCG/ACT nasal spray Place 1-2 sprays into both nostrils daily as needed for allergies or rhinitis. 09/14/20   Liane Comber, NP  gabapentin (NEURONTIN) 300 MG capsule Take  2 capsules  2 x /day  for Pain Patient taking differently: Take 300 mg by mouth See admin instructions. Take 300 mg by mouth at bedtime and an additional 300 mg one to three times a day as needed for pain 07/20/20   Unk [name], MD  metFORMIN (GLUCOPHAGE-XR) 500 MG 24 hr tablet TAKE 2 TABLETS BY MOUTH 2 X /DAY WITH MEALS FOR DIABETES Patient taking differently: Take 500 mg by mouth See admin instructions. Take 500 mg by mouth with lunch, dinner, and at bedtime  09/12/20   Liane Comber, NP  montelukast (SINGULAIR) 10 MG tablet TAKE 1 TABLET BY MOUTH DAILY FOR ALLERGIES Patient taking differently: Take 5-10 mg by mouth at bedtime as needed (for allergies). 06/18/20   Liane Comber, NP  Multiple Vitamins-Minerals (ICAPS) CAPS Take 1 capsule by mouth daily with lunch.    [provider]  Multiple Vitamins-Minerals (ONE-A-DAY PROACTIVE 65+) TABS Take 1 tablet by mouth daily with breakfast.    [provider]  solifenacin (VESICARE) 10 MG tablet TAKE 1 TABLET BY MOUTH ONCE DAILY FOR BLADDER Patient taking differently: Take 10 mg by mouth at bedtime. 07/22/20   Garnet Sierras, NP  vitamin C (ASCORBIC ACID) 500 MG tablet Take 500  mg by mouth every 7 (seven) days.    [provider]    Allergies    Effexor [venlafaxine], Nortriptyline, Other, Paxil [paroxetine hcl], Robaxin [methocarbamol], Tizanidine, and Zoloft [sertraline hcl]  Review of Systems   Review of Systems  Constitutional: Positive for fatigue. Negative for chills and fever.  HENT: Negative for ear pain and sore throat.   Eyes: Negative for pain and visual disturbance.  Respiratory: Negative for cough and shortness of breath.   Cardiovascular: Negative for chest pain and palpitations.  Gastrointestinal: Negative for abdominal pain and vomiting.  Genitourinary: Negative for dysuria and hematuria.  Musculoskeletal: Positive for arthralgias and myalgias.  Skin: Negative for color change and rash.  Neurological: Negative for seizures and syncope.  All other systems reviewed and are negative.   Physical Exam Updated Vital Signs BP (!) 174/79   Pulse 78   Temp 98 F (36.7 C) (Oral)   Resp 13   Ht 5\' 6"  (1.676 m)   Wt 72.6 kg   SpO2 98%   BMI 25.82 kg/m   Physical Exam Constitutional:      General: She is not in acute distress. HENT:     Head: Normocephalic.     Comments: 2 large forehead lacerations healed with sutures in place Forehead  contusion Eyes:     Conjunctiva/sclera: Conjunctivae normal.     Pupils: Pupils are equal, round, and reactive to light.  Cardiovascular:     Rate and Rhythm: Normal rate and regular rhythm.  Pulmonary:     Effort: Pulmonary effort is normal. No respiratory distress.  Abdominal:     General: There is no distension.     Tenderness: There is no abdominal tenderness.  Skin:    General: Skin is warm and dry.  Neurological:     General: No focal deficit present.     Mental Status: She is alert. Mental status is at baseline.  Psychiatric:        Mood and Affect: Mood normal.        Behavior: Behavior normal.     ED Results / Procedures / Treatments   Labs (all labs ordered are listed, but only abnormal results are displayed) Labs Reviewed  COMPREHENSIVE METABOLIC PANEL - Abnormal; Notable for the following components:      Result Value   Potassium 3.4 (*)    BUN 29 (*)    Total Bilirubin 2.1 (*)    All other components within normal limits  URINALYSIS, ROUTINE W REFLEX MICROSCOPIC - Abnormal; Notable for the following components:   Glucose, UA 50 (*)    Hgb urine dipstick SMALL (*)    Ketones, ur 80 (*)    Protein, ur 30 (*)    All other components within normal limits  CBC WITH DIFFERENTIAL/PLATELET    EKG None  Radiology No results found.  Procedures Procedures   Medications Ordered in ED Medications  pantoprazole (PROTONIX) EC tablet 40 mg (has no administration in time range)  metFORMIN (GLUCOPHAGE-XR) 24 hr tablet 1,000 mg (has no administration in time range)  darifenacin (ENABLEX) 24 hr tablet 7.5 mg (has no administration in time range)  acetaminophen (TYLENOL) tablet 650 mg (has no administration in time range)  gabapentin (NEURONTIN) capsule 600 mg (has no administration in time range)  sodium chloride 0.9 % bolus 500 mL (0 mLs Intravenous Stopped 11/10/20 1859)    ED Course  I have reviewed the triage vital signs and the nursing notes.  Pertinent  labs & imaging results  that were available during my care of the patient were reviewed by me and considered in my medical decision making (see chart for details).  Patient here with weakness, acute on chronic. Good strength on exam, but reports her legs "give out" when she tries to stand.  Not stable in the ED standing on her own.    Basic labs showing no significant dehydration UA without sign of infection  Discussed care with neighbor and son.  We will board her overnight in the ED to allow family to make some arrangements to bring her home tomorrow.  I've consulted TOC team regarding additional help at home.  She needs assistance getting out of bed, and taking her regular medications.  I removed the sutures placed last week as they were due for removal in 7 days for her forehead laceration     Final Clinical Impression(s) / ED Diagnoses Final diagnoses:  None    Rx / DC Orders ED Discharge Orders    None       Efren Kross, Carola Rhine, MD 11/11/20 [phone]

## 2020-11-10 NOTE — ED Notes (Signed)
Pt placed on bedpan

## 2020-11-10 NOTE — ED Triage Notes (Signed)
Arrives via EMS, from home.  Patient lives alone and has had recent falls.  Patient co generalized weakness with a decreased appetite.  On assessment, the patient appears to have a lot of bruising to her facial area and sutures noted to her forehead.  EMS reported that she was seen at [facility] 1 week ago for a fall

## 2020-11-11 DIAGNOSIS — R531 Weakness: Secondary | ICD-10-CM | POA: Diagnosis not present

## 2020-11-11 NOTE — Evaluation (Signed)
Physical Therapy Evaluation Patient Details Name: Debra Short MRN: [id number] DOB: 07-31-1940 Today's Date: 11/11/2020   History of Present Illness  Debra Short is a 80 y.o. female presenting 11/10/20  from home with generalized weakness, inability to get out of bed, DC from [facility] ED 5/24/22after recurrent falls, sustaining a small subdural hematoma, forehead laceration.  Clinical Impression  The patient is pleasantly confused. Oriented her to  Time, she is oriented to [facility]. Patient  Does not recall how she arrived here.  Patient requires mod assistance of 1  to  Sit onto bed edge, to stand and pivot to a chair and back to gurney. Patient with unsteady  Balance when standing. She will require assistance for all aspects of mobility at this time. Patient can benefit from Short term rehab or return home with 24/7 caregivers. She may benefit from ALF in the future.   No family present. Pt admitted with above diagnosis. Pt currently with functional limitations due to the deficits listed below (see PT Problem List). Pt will benefit from skilled PT to increase their independence and safety with mobility to allow discharge to the venue listed below.       Follow Up Recommendations SNF    Equipment Recommendations  None recommended by PT    Recommendations for Other Services       Precautions / Restrictions Precautions Precautions: Fall      Mobility  Bed Mobility Overal bed mobility: Needs Assistance Bed Mobility: Supine to Sit;Sit to Supine     Supine to sit: Mod assist Sit to supine: Mod assist   General bed mobility comments: assist with legs  and trunk, initially leaning posteriorly and needing support. zGradually got  her balance sitting.    Transfers Overall transfer level: Needs assistance Equipment used: 1 person hand held assist Transfers: Sit to/from [facility] Sit to Stand: Mod assist Stand pivot transfers: Mod assist       General transfer  comment: mod assist to stand from gurney and from  chair, Requires support for bakance to  take steps to chair then back to gurney.  Ambulation/Gait             General Gait Details: NT at this time  Stairs            Wheelchair Mobility    Modified Rankin (Stroke Patients Only)       Balance Overall balance assessment: History of Falls;Needs assistance Sitting-balance support: Bilateral upper extremity supported;Feet supported Sitting balance-Leahy Scale: Poor Sitting balance - Comments: gradually able to sit unsupported   Standing balance support: Bilateral upper extremity supported;During functional activity Standing balance-Leahy Scale: Poor Standing balance comment: requires support when standing and   transferring                             Pertinent Vitals/Pain Faces Pain Scale: Hurts little more Pain Location: LEs, back Pain Descriptors / Indicators: Discomfort Pain Intervention(s): Monitored during session    Home Living Family/patient expects to be discharged to:: Private residence Living Arrangements: Alone Available Help at Discharge: Family;Friend(s);Available PRN/intermittently Type of Home: House Home Access: Stairs to enter Entrance Stairs-Rails: None Entrance Stairs-Number of Steps: 2 Home Layout: One level Home Equipment: Shower seat;Hand held shower head;Grab bars - toilet;Grab bars - tub/shower      Prior Function           Comments: since DC from  ED 5/24, unable to walk,  care for self.     Hand Dominance   Dominant Hand: Right    Extremity/Trunk Assessment   Upper Extremity Assessment Upper Extremity Assessment: LUE deficits/detail LUE Deficits / Details: of shoulder, noted clavicla appears  old fracture.    Lower Extremity Assessment Lower Extremity Assessment: Generalized weakness    Cervical / Trunk Assessment Cervical / Trunk Assessment: Kyphotic  Communication   Communication: No difficulties   Cognition Arousal/Alertness: Awake/alert Behavior During Therapy: WFL for tasks assessed/performed Overall Cognitive Status: No family/caregiver present to determine baseline cognitive functioning Area of Impairment: Orientation;Attention;Memory;Following commands;Safety/judgement;Awareness                 Orientation Level: Time;Situation Current Attention Level: Alternating Memory: Decreased short-term memory Following Commands: Follows one step commands consistently Safety/Judgement: Decreased awareness of safety Awareness: Emergent   General Comments: pt. does not recqall ghow and when she came to [facility] ED. reoriented several times.      General Comments      Exercises     Assessment/Plan    PT Assessment Patient needs continued PT services  PT Problem List Decreased balance;Decreased mobility;Decreased cognition;Decreased safety awareness;Pain       PT Treatment Interventions DME instruction;Gait training;Stair training;Functional mobility training;Therapeutic activities;Therapeutic exercise;Balance training;Cognitive remediation;Patient/family education    PT Goals (Current goals can be found in the Care Plan section)  Acute Rehab PT Goals Patient Stated Goal: I need some help PT Goal Formulation: With patient Time For Goal Achievement: 11/25/20 Potential to Achieve Goals: Fair    Frequency Min 2X/week   Barriers to discharge Decreased caregiver support      Co-evaluation               AM-PAC PT "6 Clicks" Mobility  Outcome Measure Help needed turning from your back to your side while in a flat bed without using bedrails?: A Lot Help needed moving from lying on your back to sitting on the side of a flat bed without using bedrails?: A Lot Help needed moving to and from a bed to a chair (including a wheelchair)?: A Lot Help needed standing up from a chair using your arms (e.g., wheelchair or bedside chair)?: A Lot Help needed to walk in hospital  room?: Total Help needed climbing 3-5 steps with a railing? : Total 6 Click Score: 10    End of Session Equipment Utilized During Treatment: Gait belt Activity Tolerance: Patient tolerated treatment well Patient left: in bed;with call bell/phone within reach Nurse Communication: Mobility status PT Visit Diagnosis: Unsteadiness on feet (R26.81);Repeated falls (R29.6);History of falling (Z91.81);Difficulty in walking, not elsewhere classified (R26.2)    Time: 0109-3235 PT Time Calculation (min) (ACUTE ONLY): 35 min   Charges:   PT Evaluation $PT Eval Low Complexity: 1 Low PT Treatments $Therapeutic Activity: 8-22 mins        Tresa Endo PT Acute Rehabilitation Services Pager [phone] Office [phone]   Claretha Cooper 11/11/2020, 9:50 AM

## 2020-11-11 NOTE — Discharge Planning (Signed)
Fuller Mandril, RN, BSN, [place] [phone] Pt qualifies for DME rolater.  DME  ordered through [facility].  Danielle of [facility] notified to deliver rolater to pt room prior to D/C home.  Pt currently active with [facility] services as confirmed by [facility] with Tommi Rumps of [facility].

## 2020-11-11 NOTE — ED Notes (Signed)
Patient is resting comfortably. Call bell within reach

## 2020-11-11 NOTE — ED Notes (Signed)
Pt given meal tray.

## 2020-11-11 NOTE — Progress Notes (Signed)
..   Transition of Care [facility]) - Emergency Department Mini Assessment   Patient Details  Name: Debra Short MRN: [id number] Date of Birth: 08/23/1940  Transition of Care [facility]) CM/SW Contact:    Gaetano Hawthorne Tarpley-Carter, [place] Phone Number: 11/11/2020, 10:22 AM   Clinical Narrative: TOC CSW spoke with pt.  Pt lives a lone, and is not interested in SNF placement at this time.  Pt is requesting a Rolator and HH.  CSW will ask for assistance from [place], RN.  CSW will also place a referral for a Place for [facility] to discuss with pt her future plans and assistance with possible placement.  Kameron Glazebrook Tarpley-Carter, MSW, LCSW-A Pronouns:  She, Her, [place] ED Transitions of CareClinical Social Worker [email or web address] [phone]   ED Mini Assessment: What brought you to the Emergency Department? : Weakness  Barriers to Discharge: No Barriers Identified     Means of departure: Not know  Interventions which prevented an admission or readmission: Home Health Consult or Services,DME Provided    Patient Contact and Communications       Contact Date: 11/11/20,          Patient states their goals for this hospitalization and ongoing recovery are:: Pt stated she would like a rolator and HH for assistance, but do not want SNF at this time.   Choice offered to / list presented to : NA  Admission diagnosis:  Fall,Weakness Patient Active Problem List   Diagnosis Date Noted  . Laceration of forehead 11/03/2020  . Subdural hematoma ([place]) 11/02/2020  . Nervous 02/29/2020  . Aortic atherosclerosis ([place]) by CXR in 2019 12/11/2019  . Osteopenia 11/26/2018  . OAB (overactive bladder) 05/14/2018  . BMI 24.0-24.9, adult 05/09/2018  . Depression, major, recurrent, in partial remission ([place]) 04/25/2017  . Degenerative disc disease, lumbar 07/20/2016  . Primary osteoarthritis of both knees 07/20/2016  . Fibromyalgia syndrome 07/20/2016   . CKD stage 2 due to type 2 diabetes mellitus ([place]) 10/04/2015  . Insomnia 10/06/2014  . Medication management 10/09/2013  . Hyperlipidemia associated with type 2 diabetes mellitus ([place])   . Essential hypertension   . Gastroesophageal reflux disease   . Vitamin D deficiency   . IBS (irritable bowel syndrome)   . Type 2 diabetes mellitus with stage 2 chronic kidney disease, without long-term current use of insulin ([place])   . Non Hodgkin's lymphoma ([place])    PCP:  Unk [name], MD Pharmacy:   [id number] [place], [place] [address] [place] [address] Phone: [phone] Fax: [phone]  [facility] [address] [place] - [address] [place] [place] [address] Phone: [phone] Fax: [phone]

## 2020-11-11 NOTE — Progress Notes (Signed)
..   Transition of Care [facility]) - Emergency Department Mini Assessment   Patient Details  Name: Debra Short MRN: [id number] Date of Birth: [birth year]  Transition of Care [facility]) CM/SW Contact:    Daly Whipkey C Tarpley-Carter, LCSWA Phone Number: 11/11/2020, 11:19 AM   Clinical Narrative: TOC CSW spoke with pts son/John Lassiter [phone].  John will be picking pt up at 5pm once he is off of work.  Jenny Reichmann will also be staying with pt until she is back on her feet and HH is established.  Kylee Umana Tarpley-Carter, MSW, LCSW-A Pronouns:  She, Her, Hers                  [place] ED Transitions of CareClinical Social Worker [email or web address] [phone]   ED Mini Assessment: What brought you to the Emergency Department? : Weaknewss  Barriers to Discharge: No Barriers Identified     Means of departure: Car  Interventions which prevented an admission or readmission: Transportation Screening    Patient Contact and Communications Key Contact 1: Earleen Reaper   Spoke with: Alfonso Patten Date: 11/11/20,     Contact Phone Number: [phone] Call outcome: Pts son/John will be picking pt up after he gets from work around [place] today.  Patient states their goals for this hospitalization and ongoing recovery are:: Pt stated she would like a rolator and HH for assistance, but do not want SNF at this time.   Choice offered to / list presented to : NA  Admission diagnosis:  Fall,Weakness Patient Active Problem List   Diagnosis Date Noted  . Laceration of forehead 11/03/2020  . Subdural hematoma ([place]) 11/02/2020  . Nervous 02/29/2020  . Aortic atherosclerosis ([place]) by CXR in 2019 12/11/2019  . Osteopenia 11/26/2018  . OAB (overactive bladder) 05/14/2018  . BMI 24.0-24.9, adult 05/09/2018  . Depression, major, recurrent, in partial remission ([place]) 04/25/2017  . Degenerative disc disease, lumbar 07/20/2016  . Primary osteoarthritis of both knees 07/20/2016   . Fibromyalgia syndrome 07/20/2016  . CKD stage 2 due to type 2 diabetes mellitus ([place]) 10/04/2015  . Insomnia 10/06/2014  . Medication management 10/09/2013  . Hyperlipidemia associated with type 2 diabetes mellitus ([place])   . Essential hypertension   . Gastroesophageal reflux disease   . Vitamin D deficiency   . IBS (irritable bowel syndrome)   . Type 2 diabetes mellitus with stage 2 chronic kidney disease, without long-term current use of insulin ([place])   . Non Hodgkin's lymphoma ([place])    PCP:  Unk [name], MD Pharmacy:   [id number] [place], [place] [address] [place] [address] Phone: [phone] Fax: [phone]  [facility] [address] [place] - [address] [place] [place] [address] Phone: [phone] Fax: [phone]

## 2020-11-11 NOTE — ED Notes (Signed)
Pt placed on purewick 

## 2020-11-11 NOTE — ED Provider Notes (Addendum)
Emergency Medicine Observation Re-evaluation Note  Debra Short is a 80 y.o. female, seen on rounds today.  Pt initially presented to the ED for complaints of Weakness Currently, the patient is sitting up, eating breakfast.  Physical Exam  BP (!) 158/75   Pulse 95   Temp 98 F (36.7 C) (Oral)   Resp 15   Ht 5\' 6"  (1.676 m)   Wt 72.6 kg   SpO2 100%   BMI 25.82 kg/m  Physical Exam General: calm Cardiac: normal rate Lungs: no increased WOB Psych: calm  ED Course / MDM  EKG:   I have reviewed the labs performed to date as well as medications administered while in observation.  Recent changes in the last 24 hours include none.  Plan  Current plan is for [facility] consult, may go home with family. Patient is not under full IVC at this time.  12:30 pt to go home with son   Malvin Johns, MD 11/11/20 0223    Malvin Johns, MD 11/11/20 1230

## 2020-11-11 NOTE — Discharge Planning (Signed)
RNCM notified by DME company that family refused rolater due to costs.

## 2020-11-11 NOTE — ED Notes (Signed)
Debra Short, here to take pt home. Spoke w grandson to clarify he would be staying with patient since she is a high fall risk. Called son, Jenny Reichmann, to clarify since he told SW that he would pick pt up at 1700 and stay with her. John said he was at work and would "be by her house later". Asked John to re-iterate to his son the importance of someone staying with the patient so she doesn't fall. John was rude on the phone to this RN and said "it's not a priority". Spoke with grandson, went over discharge papers, put pt in paper scrubs since pt's clothes were wet, assisted pt into wheelchair and brought her out to car. Grandson said he would stay with her and help her get up and walk around.

## 2020-11-12 ENCOUNTER — Emergency Department (HOSPITAL_COMMUNITY)
Admission: EM | Admit: 2020-11-12 | Discharge: 2020-11-16 | Disposition: A | Discharge status: Home / Self Care | Payer: PPO | Attending: Emergency Medicine | Admitting: Emergency Medicine

## 2020-11-12 ENCOUNTER — Other Ambulatory Visit: Payer: Self-pay

## 2020-11-12 ENCOUNTER — Emergency Department (HOSPITAL_COMMUNITY): Payer: PPO

## 2020-11-12 ENCOUNTER — Encounter (HOSPITAL_COMMUNITY): Payer: Self-pay

## 2020-11-12 DIAGNOSIS — R112 Nausea with vomiting, unspecified: Secondary | ICD-10-CM

## 2020-11-12 DIAGNOSIS — S0993XA Unspecified injury of face, initial encounter: Secondary | ICD-10-CM | POA: Diagnosis not present

## 2020-11-12 DIAGNOSIS — Z20822 Contact with and (suspected) exposure to covid-19: Secondary | ICD-10-CM | POA: Diagnosis not present

## 2020-11-12 DIAGNOSIS — E119 Type 2 diabetes mellitus without complications: Secondary | ICD-10-CM | POA: Insufficient documentation

## 2020-11-12 DIAGNOSIS — R9431 Abnormal electrocardiogram [ECG] [EKG]: Secondary | ICD-10-CM | POA: Diagnosis not present

## 2020-11-12 DIAGNOSIS — R531 Weakness: Secondary | ICD-10-CM | POA: Diagnosis not present

## 2020-11-12 DIAGNOSIS — S0003XA Contusion of scalp, initial encounter: Secondary | ICD-10-CM | POA: Diagnosis not present

## 2020-11-12 DIAGNOSIS — S0083XA Contusion of other part of head, initial encounter: Secondary | ICD-10-CM | POA: Diagnosis not present

## 2020-11-12 DIAGNOSIS — G319 Degenerative disease of nervous system, unspecified: Secondary | ICD-10-CM | POA: Diagnosis not present

## 2020-11-12 DIAGNOSIS — W19XXXA Unspecified fall, initial encounter: Secondary | ICD-10-CM | POA: Diagnosis not present

## 2020-11-12 DIAGNOSIS — S0990XA Unspecified injury of head, initial encounter: Secondary | ICD-10-CM | POA: Diagnosis present

## 2020-11-12 DIAGNOSIS — I10 Essential (primary) hypertension: Secondary | ICD-10-CM | POA: Diagnosis not present

## 2020-11-12 DIAGNOSIS — R58 Hemorrhage, not elsewhere classified: Secondary | ICD-10-CM | POA: Diagnosis not present

## 2020-11-12 DIAGNOSIS — R1111 Vomiting without nausea: Secondary | ICD-10-CM | POA: Diagnosis not present

## 2020-11-12 DIAGNOSIS — R110 Nausea: Secondary | ICD-10-CM | POA: Diagnosis not present

## 2020-11-12 LAB — COMPREHENSIVE METABOLIC PANEL
ALT: 21 U/L (ref 0–44)
AST: 34 U/L (ref 15–41)
Albumin: 4.3 g/dL (ref 3.5–5.0)
Alkaline Phosphatase: 61 U/L (ref 38–126)
Anion gap: 12 (ref 5–15)
BUN: 20 mg/dL (ref 8–23)
CO2: 22 mmol/L (ref 22–32)
Calcium: 9.8 mg/dL (ref 8.9–10.3)
Chloride: 103 mmol/L (ref 98–111)
Creatinine, Ser: 0.78 mg/dL (ref 0.44–1.00)
GFR, Estimated: >60 mL/min (ref >60)
Glucose, Bld: 103 mg/dL — ABNORMAL HIGH (ref 70–99)
Potassium: 5.1 mmol/L (ref 3.5–5.1)
Sodium: 137 mmol/L (ref 135–145)
Total Bilirubin: 2.4 mg/dL — ABNORMAL HIGH (ref 0.3–1.2)
Total Protein: 8 g/dL (ref 6.5–8.1)

## 2020-11-12 LAB — CBC
HCT: 42.4 % (ref 36.0–46.0)
Hemoglobin: 14 g/dL (ref 12.0–15.0)
MCH: 30.5 pg (ref 26.0–34.0)
MCHC: 33 g/dL (ref 30.0–36.0)
MCV: 92.4 fL (ref 80.0–100.0)
Platelets: 386 10*3/uL (ref 150–400)
RBC: 4.59 MIL/uL (ref 3.87–5.11)
RDW: 12.5 % (ref 11.5–15.5)
WBC: 9.3 10*3/uL (ref 4.0–10.5)
nRBC: 0 % (ref 0.0–0.2)

## 2020-11-12 LAB — LIPASE, BLOOD: Lipase: 30 U/L (ref 11–51)

## 2020-11-12 MED ORDER — ONDANSETRON 4 MG PO TBDP
4.0000 mg | ORAL_TABLET | Freq: Once | ORAL | Status: AC
  Administered 2020-11-13: 4 mg via ORAL
  Filled 2020-11-12: qty 1

## 2020-11-12 NOTE — ED Triage Notes (Signed)
Pt presents via EMS from home, c/o n/v with standing. Pt states she was seen after a fall w/ head injury about 1wk ago and has been having n/v, weakness since. Pt states she is having a hard time taking care of herself at home since she lives by herself.

## 2020-11-13 ENCOUNTER — Encounter (HOSPITAL_COMMUNITY): Payer: Self-pay

## 2020-11-13 ENCOUNTER — Telehealth: Payer: Self-pay | Admitting: *Deleted

## 2020-11-13 ENCOUNTER — Telehealth: Payer: Self-pay | Admitting: Internal Medicine

## 2020-11-13 DIAGNOSIS — S0003XA Contusion of scalp, initial encounter: Secondary | ICD-10-CM | POA: Diagnosis not present

## 2020-11-13 DIAGNOSIS — S0993XA Unspecified injury of face, initial encounter: Secondary | ICD-10-CM | POA: Diagnosis not present

## 2020-11-13 DIAGNOSIS — G319 Degenerative disease of nervous system, unspecified: Secondary | ICD-10-CM | POA: Diagnosis not present

## 2020-11-13 MED ORDER — ALPRAZOLAM 0.5 MG PO TABS
0.5000 mg | ORAL_TABLET | Freq: Three times a day (TID) | ORAL | Status: DC | PRN
  Administered 2020-11-14 – 2020-11-15 (×3): 0.5 mg via ORAL
  Filled 2020-11-13 (×3): qty 1

## 2020-11-13 MED ORDER — FLUTICASONE PROPIONATE 50 MCG/ACT NA SUSP
2.0000 | Freq: Every day | NASAL | Status: DC
  Administered 2020-11-15: 2 via NASAL
  Filled 2020-11-13: qty 16

## 2020-11-13 MED ORDER — GABAPENTIN 300 MG PO CAPS
300.0000 mg | ORAL_CAPSULE | Freq: Two times a day (BID) | ORAL | Status: DC
  Administered 2020-11-14 – 2020-11-16 (×6): 300 mg via ORAL
  Filled 2020-11-13 (×6): qty 1

## 2020-11-13 MED ORDER — FLUOXETINE HCL 20 MG PO CAPS
40.0000 mg | ORAL_CAPSULE | Freq: Every day | ORAL | Status: DC
  Administered 2020-11-14 – 2020-11-16 (×3): 40 mg via ORAL
  Filled 2020-11-13 (×3): qty 2

## 2020-11-13 MED ORDER — METFORMIN HCL 500 MG PO TABS
500.0000 mg | ORAL_TABLET | Freq: Three times a day (TID) | ORAL | Status: DC
  Administered 2020-11-14 – 2020-11-16 (×8): 500 mg via ORAL
  Filled 2020-11-13 (×8): qty 1

## 2020-11-13 NOTE — Progress Notes (Signed)
TOC CSW attempted to contact pts son/Kevin (John) [phone].  CSW left HIPPA compliant message with my contact information.  Alline Pio Tarpley-Carter, MSW, LCSW-A Pronouns:  She, Her, [place] ED Transitions of CareClinical Social Worker [email or web address] [phone]

## 2020-11-13 NOTE — Progress Notes (Signed)
TOC CSW spoke with pts son/Kevin (John) in regards to pts dc plan.  CSW stated that arrangements with Alvis Lemmings was set up on pts previous visit.  Lennette Bihari was also given another referral to a Place [facility] Mom and told to answer their call.  Lennette Bihari disclosed that he has spoken with them, but did not want to pay them for their assistance.  Lennette Bihari and grandson has been staying with pt.  Although, it is inconvenient for Lennette Bihari to stay with pt but he has stayed with pt when he wasn't working.  Lennette Bihari is in need of someone to stay with pt while he works.  CSW has advised that Lennette Bihari and pt hire a [facility] agency to assist with this need.  CSW also advised that Lennette Bihari seek assistance from PCP for placement for appropriate placement pt.  CSW is currently awaiting a phone call from [place] in regards to the specific time of pick up of pt.  Rachelle Edwards Tarpley-Carter, MSW, LCSW-A Pronouns:  She, Her, [place] ED Transitions of CareClinical Social Worker [email or web address] [phone]

## 2020-11-13 NOTE — Progress Notes (Addendum)
11/13/2020 530 pm Son requested [facility] SNF rehab. Waiting insurance auth for SNF and PTAR transport. Will contact [facility] on 11/14/2020 in am to confirm bed for 11/14/2020 dc. Jonnie Finner RN CCM, [facility] ED TOC CM [phone]  11/13/2020 433 pm TOC CM spoke to [facility] rep, [place]. They have a bed on Monday. Spoke to [employer] and [employer] [facility] can accept on Saturday. Waiting [employer] auth for SNF and ambulance transport. Spoke to son, Lennette Bihari and provided bed offers. He will review and give TOC CM a call. [place], [place] ED TOC CM [phone]

## 2020-11-13 NOTE — ED Provider Notes (Signed)
[facility] Emergency Department Provider Note MRN:  [id number]  [place] date & time: 11/13/20     Chief Complaint   Emesis   History of Present Illness   Debra Short is a 80 y.o. year-old female with a history of lymphoma, diabetes presenting to the ED with chief complaint of vomiting.  Worsening nausea and vomiting today.  Recent fall and head trauma with subdural hematoma.  Recently discharged home.  Denies any significant headache, no pain in the chest or abdomen.  No other injuries or complaints.  Symptoms are moderate, constant, no exacerbating or alleviating factors.  Review of Systems  A complete 10 system review of systems was obtained and all systems are negative except as noted in the HPI and PMH.   Patient's Health History    Past Medical History:  Diagnosis Date  . Allergy   . Diabetes mellitus without complication ([place])   . Fibromyalgia   . History of migraine 08/25/2016  . Hyperlipidemia   . IBS (irritable bowel syndrome)   . 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 Vitamin D deficiency     Past Surgical History:  Procedure Laterality Date  . ABDOMINAL HYSTERECTOMY    . ORIF ANKLE FRACTURE Left 09/26/2014  . ORIF ANKLE FRACTURE Left 09/26/2014   Procedure: OPEN REDUCTION INTERNAL FIXATION (ORIF) ANKLE FRACTURE;  Surgeon: [name], MD;  Location: [facility];  Service: Orthopedics;  Laterality: Left;  . TONSILLECTOMY      Family History  Problem Relation Age of Onset  . Cancer Mother   . Cancer Father   . Cancer Brother   . Cancer Maternal Grandfather     Social History   Socioeconomic History  . Marital status: Widowed    Spouse name: Not on file  . Number of children: Not on file  . Years of education: Not on file  . Highest education level: Not on file  Occupational History  . Not on file  Tobacco Use  . Smoking status: Never Smoker  . Smokeless tobacco: Never Used  Vaping Use  . Vaping Use: Never used  Substance and Sexual  Activity  . Alcohol use: No  . Drug use: No  . Sexual activity: Never  Other Topics Concern  . Not on file  Social History Narrative  . Not on file   Social Determinants of Health   Financial Resource Strain: Not on file  Food Insecurity: Not on file  Transportation Needs: Not on file  Physical Activity: Not on file  Stress: Not on file  Social Connections: Not on file  Intimate Partner Violence: Not on file     Physical Exam   Vitals:   11/13/20 0245 11/13/20 0300  BP: 139/62 137/66  Pulse: 73 74  Resp:  18  Temp:    SpO2: 96% 98%    CONSTITUTIONAL: Chronically ill-appearing, NAD NEURO:  Alert and oriented x 3, no focal deficits EYES:  eyes equal and reactive ENT/NECK:  no LAD, no JVD CARDIO: Regular rate, well-perfused, normal S1 and S2 PULM:  CTAB no wheezing or rhonchi GI/GU:  normal bowel sounds, non-distended, non-tender MSK/SPINE:  No gross deformities, no edema SKIN: Diffuse bruising to the forehead with well-healing, sutured wounds PSYCH:  Appropriate speech and behavior  *Additional and/or pertinent findings included in MDM below  Diagnostic and Interventional Summary    EKG Interpretation  Date/Time:  Friday November 13 2020 00:48:35 EDT Ventricular Rate:  82 PR Interval:  164 QRS Duration: 89 QT  Interval:  392 QTC Calculation: 458 R Axis:   -3 Text Interpretation: Sinus rhythm Confirmed by Gerlene Fee [phone]) on 11/13/2020 12:54:07 AM      Labs Reviewed  COMPREHENSIVE METABOLIC PANEL - Abnormal; Notable for the following components:      Result Value   Glucose, Bld 103 (*)    Total Bilirubin 2.4 (*)    All other components within normal limits  LIPASE, BLOOD  CBC  URINALYSIS, ROUTINE W REFLEX MICROSCOPIC    CT HEAD WO CONTRAST  Final Result      Medications  ondansetron (ZOFRAN-ODT) disintegrating tablet 4 mg (4 mg Oral Given 11/13/20 0157)     Procedures  /  Critical Care Procedures  ED Course and Medical Decision Making  I have  reviewed the triage vital signs, the nursing notes, and pertinent available records from the EMR.  Listed above are laboratory and imaging tests that I personally ordered, reviewed, and interpreted and then considered in my medical decision making (see below for details).  Suspect concussive syndrome, but considering worsening intracranial bleeding.  Will repeat CT head and monitor closely.  Abdomen soft and nontender, no rebound guarding or rigidity.  Patient lives alone, does not seem to be doing well, has a son who checks in frequently but has to work.  At this point her home situation does not seem ideal, may need TOC.     Patient's symptoms are largely resolved.  Work-up is reassuring with stable CT head.  Will have TOC evaluate home needs in the morning.  Signed out to default provider.  Barth Kirks. Sedonia Small, [place] [email or web address]  Final Clinical Impressions(s) / ED Diagnoses     ICD-10-CM   1. Non-intractable vomiting with nausea, unspecified vomiting type  R11.2     ED Discharge Orders    None       Discharge Instructions Discussed with and Provided to Patient:   Discharge Instructions   None       Maudie Flakes, MD 11/13/20 [phone]

## 2020-11-13 NOTE — ED Notes (Signed)
Awake, comfortable, awaiting call from SW.

## 2020-11-13 NOTE — Telephone Encounter (Signed)
Patient's son called for advise regarding his overwhelming concern for patient's well being as hospital social worker insists on discharge to home w/ no one to care for her, and patient is a very high fall risk. Per Dr. Melford Aase, advise son to request skilled care placement from the social worker at the hospital.

## 2020-11-13 NOTE — Progress Notes (Signed)
TOC CSW spoke with pt and Loma Sousa, RN at bedside in regards to pts options.  Pt is still refusing SNF placement and would like HH.  CSW disclosed that she would solicit the assistance of TOC CM for follow up with [facility] and contact son/Kevin.  CSW also disclosed the referral for a Place for [facility] was resent and someone should be reaching out to pt and pts son/Kevin.  Detrell Umscheid Tarpley-Carter, MSW, LCSW-A Pronouns:  She, Her, [place] ED Transitions of CareClinical Social Worker [email or web address] [phone]

## 2020-11-13 NOTE — Progress Notes (Addendum)
.   Transition of Care [facility]) - Emergency Department Mini Assessment   Patient Details  Name: Debra Short MRN: [id number] Date of Birth: [birth year]  Transition of Care [facility]) CM/SW Contact:    Erenest Rasher, RN Phone Number: [phone] 11/13/2020, 1:09 PM   Clinical Narrative:  TOC CM spoke to pt's son, Debra Short. States received call from [facility] and they declined referral due to pt needing SNF rehab and they requested he contact pt's PCP. States he will not be able to provide 24 hour care for her at home. Pt was ambulatory until recent falls at home. Discussed with son long term goals for pt after SNF rehab. States he has discussed with pt/mother coming to live with him in his home and hiring a private duty caregiver for her while he works during the day. TOC CM discussed ALF and a Place for Mom. States he does have contact information and will be researching facilities. States he hopes that once she completes rehab she will be stronger to return to her home with private duty caregivers to [place] permission to complete FL2 and fax referral to SNF for placement. Ed provider notified for PT eval.   ED Mini Assessment: What brought you to the Emergency Department? : weakness  Barriers to Discharge: SNF Pending bed offer     Means of departure: Not know  Interventions which prevented an admission or readmission: SNF Placement    Patient Contact and Communications Key Contact 1: Debra Short, Debra Short Date: 11/13/20,   Contact time: 1303 Contact Phone Number: [phone] Call outcome: son is requesting SNF rehab  Patient states their goals for this hospitalization and ongoing recovery are:: rehab to get stronger CMS Medicare.gov Compare Post Acute Care list provided to:: Patient Represenative (must comment) (son-Debra Short) Choice offered to / list presented to : Adult Children  Admission diagnosis:  Emesis Patient Active Problem List   Diagnosis Date  Noted  . Laceration of forehead 11/03/2020  . Subdural hematoma ([place]) 11/02/2020  . Nervous 02/29/2020  . Aortic atherosclerosis ([place]) by CXR in 2019 12/11/2019  . Osteopenia 11/26/2018  . OAB (overactive bladder) 05/14/2018  . BMI 24.0-24.9, adult 05/09/2018  . Depression, major, recurrent, in partial remission ([place]) 04/25/2017  . Degenerative disc disease, lumbar 07/20/2016  . Primary osteoarthritis of both knees 07/20/2016  . Fibromyalgia syndrome 07/20/2016  . CKD stage 2 due to type 2 diabetes mellitus ([place]) 10/04/2015  . Insomnia 10/06/2014  . Medication management 10/09/2013  . Hyperlipidemia associated with type 2 diabetes mellitus ([place])   . Essential hypertension   . Gastroesophageal reflux disease   . Vitamin D deficiency   . IBS (irritable bowel syndrome)   . Type 2 diabetes mellitus with stage 2 chronic kidney disease, without long-term current use of insulin ([place])   . Non Hodgkin's lymphoma ([place])    PCP:  Unk [name], MD Pharmacy:   [id number] [place], [place] [address] [place] [address] Phone: [phone] Fax: [phone]  [facility] [address] [place] - [address] [place] [place] [address] Phone: [phone] Fax: [phone]

## 2020-11-13 NOTE — ED Notes (Signed)
Spoke with son Jenny Reichmann who states patient will not be coming home as per pts PCP and will need SNF placement. Relayed this to [place].

## 2020-11-13 NOTE — NC FL2 (Signed)
[place] LEVEL OF CARE SCREENING TOOL     IDENTIFICATION  Patient Name: Debra Short Birthdate: 11-May-1941 Sex: female Admission Date (Current Location): 11/12/2020  [facility] and [place] Number:  Herbalist and Address:  [facility],  [place] [address], [place]      Provider Number: [id number]  Attending Physician Name and Address:  Default, Provider, MD  Relative Name and Phone Number:  [name] #[phone]    Current Level of Care: Hospital Recommended Level of Care: [place] Prior Approval Number:    Date Approved/Denied:   PASRR Number: [id number]  Discharge Plan: SNF    Current Diagnoses: Patient Active Problem List   Diagnosis Date Noted  . Laceration of forehead 11/03/2020  . Subdural hematoma ([place]) 11/02/2020  . Nervous 02/29/2020  . Aortic atherosclerosis ([place]) by CXR in 2019 12/11/2019  . Osteopenia 11/26/2018  . OAB (overactive bladder) 05/14/2018  . BMI 24.0-24.9, adult 05/09/2018  . Depression, major, recurrent, in partial remission ([place]) 04/25/2017  . Degenerative disc disease, lumbar 07/20/2016  . Primary osteoarthritis of both knees 07/20/2016  . Fibromyalgia syndrome 07/20/2016  . CKD stage 2 due to type 2 diabetes mellitus ([place]) 10/04/2015  . Insomnia 10/06/2014  . Medication management 10/09/2013  . Hyperlipidemia associated with type 2 diabetes mellitus ([place])   . Essential hypertension   . Gastroesophageal reflux disease   . Vitamin D deficiency   . IBS (irritable bowel syndrome)   . Type 2 diabetes mellitus with stage 2 chronic kidney disease, without long-term current use of insulin ([place])   . Non Hodgkin's lymphoma ([place])     Orientation RESPIRATION BLADDER Height & Weight     Self,Time,Situation,Place  Normal Continent Weight:  5'6" Height:   72.6 kg  BEHAVIORAL SYMPTOMS/MOOD NEUROLOGICAL BOWEL NUTRITION STATUS      Continent Diet (regular)  AMBULATORY STATUS  COMMUNICATION OF NEEDS Skin   Extensive Assist Verbally Normal                       Personal Care Assistance Level of Assistance  Bathing,Feeding,Dressing Bathing Assistance: Limited assistance Feeding assistance: Independent Dressing Assistance: Limited assistance     Functional Limitations Info  Sight,Hearing,Speech Sight Info: Adequate Hearing Info: Adequate Speech Info: Adequate    SPECIAL CARE FACTORS FREQUENCY  PT (By licensed PT),OT (By licensed OT)     PT Frequency: 5x per week OT Frequency: 5x per week            Contractures Contractures Info: Not present    Additional Factors Info  Code Status,Allergies Code Status Info: Full Code Allergies Info: effexor, nortriptyline, paxil, robaxin, tizanidine, zoloft           Current Medications (11/13/2020):  This is the current hospital active medication list No current facility-administered medications for this encounter.   Current Outpatient Medications  Medication Sig Dispense Refill  . acetaminophen (TYLENOL) 650 MG CR tablet Take 650 mg by mouth See admin instructions. Take 650 mg by mouth at bedtime and an additional 650 mg during the day as needed for pain    . ALPRAZolam (XANAX) 1 MG tablet TAKE 1/2 - 1 TABLET 2 - 3 X /DAY ONLY IF NEEDED FOR ANXIETY ATTACK & LIMIT TO 5 DAYS /WEEK TO AVOID ADDICTION & DEMENTIA (Patient taking differently: Take 0.5-1 mg by mouth See admin instructions. Take 0.5-1 mg by mouth two to to three times a day "only if needed  for an anxiety attack and limit to 5 days a week to avoid addiction and dementia") 90 tablet 0  . atorvastatin (LIPITOR) 80 MG tablet TAKE 1/3 TABLET BY MOUTH DAILY FOR CHOLESTEROL. DISCONTINUE PRAVASTATIN (Patient taking differently: Take 26.7 mg by mouth daily.) 90 tablet 1  . Cholecalciferol (VITAMIN D-3 PO) Take 1-2 capsules by mouth daily.    Marland Kitchen esomeprazole (NEXIUM) 40 MG capsule Take     1 capsule     Daily     to Prevent Heartburn & Indigestion (Patient  taking differently: Take 40 mg by mouth daily as needed (for heartburn or reflux).) 90 capsule 33  . FLUoxetine (PROZAC) 40 MG capsule TAKE 1 CAPSULE DAILY FOR MOOD (Patient taking differently: Take 40 mg by mouth daily in the afternoon.) 90 capsule 3  . fluticasone (FLONASE) 50 MCG/ACT nasal spray Place 1-2 sprays into both nostrils daily as needed for allergies or rhinitis. 48 mL 4  . gabapentin (NEURONTIN) 300 MG capsule Take  2 capsules  2 x /day  for Pain (Patient taking differently: Take 300 mg by mouth See admin instructions. Take 300 mg by mouth at bedtime and an additional 300 mg one to three times a day as needed for pain) 360 capsule 0  . metFORMIN (GLUCOPHAGE-XR) 500 MG 24 hr tablet TAKE 2 TABLETS BY MOUTH 2 X /DAY WITH MEALS FOR DIABETES (Patient taking differently: Take 500 mg by mouth See admin instructions. Take 500 mg by mouth with lunch, dinner, and at bedtime) 360 tablet 1  . montelukast (SINGULAIR) 10 MG tablet TAKE 1 TABLET BY MOUTH DAILY FOR ALLERGIES (Patient taking differently: Take 5-10 mg by mouth at bedtime as needed (for allergies).) 90 tablet 1  . Multiple Vitamins-Minerals (ICAPS) CAPS Take 1 capsule by mouth daily with lunch.    . Multiple Vitamins-Minerals (ONE-A-DAY PROACTIVE 65+) TABS Take 1 tablet by mouth daily with breakfast.    . solifenacin (VESICARE) 10 MG tablet TAKE 1 TABLET BY MOUTH ONCE DAILY FOR BLADDER (Patient taking differently: Take 10 mg by mouth at bedtime.) 90 tablet 0  . vitamin C (ASCORBIC ACID) 500 MG tablet Take 500 mg by mouth every 7 (seven) days.       Discharge Medications: Please see discharge summary for a list of discharge medications.  Relevant Imaging Results:  Relevant Lab Results:   Additional Information #[id number] [phone] [id number] [phone] vaccinated and boosted  Erenest Rasher, RN

## 2020-11-13 NOTE — ED Notes (Signed)
[name] speaking with son regarding pt transport back home. Awaiting update.

## 2020-11-13 NOTE — Progress Notes (Signed)
TOC CSW solicited the assistance of Camellia, RN to assist with the follow up for [facility]) referral.  CSW also sent a new referral to [facility] to assist pt and pts son/Kevin with assistance with future placement for pt.  Farah Benish Tarpley-Carter, MSW, LCSW-A Pronouns:  She, Her, Hers                  [place] ED Transitions of CareClinical Social Worker [email or web address] [phone]

## 2020-11-13 NOTE — Evaluation (Signed)
Physical Therapy Evaluation Patient Details Name: Debra Short MRN: [id number] DOB: [birth year] Today's Date: 11/13/2020   History of Present Illness  Debra Short is a 80 yo female who presents with c/o vomiting. Recently d/c on 11/04/20 after recurrent falls, sustained small subdural hematoma and forehead laceration, also admitted 11/10/20-11/11/20 from home with generalized weakness, inability to get out of bed. Head CT 6/3: No acute intracranial abnormality, small left frontal scalp hematoma without skull fracture. PMH: lymphoma, diabetes, fibromyalgia, IBS, L ankle ORIF 2016    Clinical Impression  Pt admitted with above diagnosis. Pt independent at baseline, but hasn't been able to take care of herself or ambulate since returning home 11/04/20. Pt currently requiring mod A with bed mobility and min A to steady with rising to stand and BLE braced against gurney. Pt declines ambulation due to being hungry; RN notified pt requesting lunch tray. Whil seated EOB, pt unable to reach floor due to elevated height and requires UE support and time to find upright sitting posture. Pt lacks family support, in agreement with SNF recommendation. Pt currently with functional limitations due to the deficits listed below (see PT Problem List). Pt will benefit from skilled PT to increase their independence and safety with mobility to allow discharge to the venue listed below.       Follow Up Recommendations SNF    Equipment Recommendations  None recommended by PT    Recommendations for Other Services       Precautions / Restrictions Precautions Precautions: Fall Restrictions Weight Bearing Restrictions: No      Mobility  Bed Mobility Overal bed mobility: Needs Assistance Bed Mobility: Supine to Sit;Sit to Supine  Supine to sit: Mod assist Sit to supine: Min guard   General bed mobility comments: mod A to upright trunk into sitting, pt able to slide BLE over to EOB, increased time and posterior  lean requiring increased time to find upright sitting at EOB; min guard to lift BLE and return to supine    Transfers Overall transfer level: Needs assistance Equipment used: 1 person hand held assist Transfers: Sit to/from Stand Sit to Stand: Min assist    General transfer comment: pt on elevated height gurney, min A for steadying with rising to stand, BLE braced against front of bed, therapist positioned anterior to reduce pt's fear of falling, declines transfer to chair at bedside  Ambulation/Gait  General Gait Details: pt declines ambulation with RW due to not eating and feeling weak  Stairs            Wheelchair Mobility    Modified Rankin (Stroke Patients Only)       Balance Overall balance assessment: Needs assistance;History of Falls   Sitting balance-Leahy Scale: Poor Sitting balance - Comments: elevated gurney so feet unable to reach floor, requiring bil UE to maintain sitting balance   Standing balance support: During functional activity;Bilateral upper extremity supported Standing balance-Leahy Scale: Poor Standing balance comment: reliant on UE support        Pertinent Vitals/Pain Pain Assessment: Faces Faces Pain Scale: Hurts a little bit Pain Location: headache Pain Descriptors / Indicators: Headache Pain Intervention(s): Limited activity within patient's tolerance;Monitored during session;Patient requesting pain meds-RN notified    Home Living Family/patient expects to be discharged to:: Private residence Living Arrangements: Alone Available Help at Discharge: Family;Friend(s);Available PRN/intermittently Type of Home: House Home Access: Stairs to enter Entrance Stairs-Rails: None Entrance Stairs-Number of Steps: 2 Home Layout: One level Home Equipment: Shower seat;Hand held shower head;Grab  bars - toilet;Grab bars - tub/shower;Walker - 2 wheels;Walker - 4 wheels;Bedside commode      Prior Function Level of Independence: Independent          Comments: Pt reports unable to walk or care for self since d/c 11/04/20, son is checking on her but he works and has girlfriend so unable to spend a long time with pt.     Hand Dominance   Dominant Hand: Right    Extremity/Trunk Assessment   Upper Extremity Assessment Upper Extremity Assessment: Overall WFL for tasks assessed    Lower Extremity Assessment Lower Extremity Assessment: Generalized weakness (AROM WNL, strength grossly 3+/5 throughout, reports numbness on bil heels only) RLE Coordination: WNL LLE Coordination: WNL    Cervical / Trunk Assessment Cervical / Trunk Assessment: Kyphotic  Communication   Communication: No difficulties  Cognition Arousal/Alertness: Awake/alert Behavior During Therapy: WFL for tasks assessed/performed Overall Cognitive Status: No family/caregiver present to determine baseline cognitive functioning  General Comments: pt alert and oriented x4, but noted moments of confusion when pt asks how long she has been in the hospital this time; pt follows commands appropriately with increased time      General Comments General comments (skin integrity, edema, etc.): Facial bruising, multiple sutures on face, bruise on R knee noted. BP 156/73, SpO2 >92% on RA, HR 70s    Exercises     Assessment/Plan    PT Assessment Patient needs continued PT services  PT Problem List Decreased activity tolerance;Decreased balance;Decreased mobility;Decreased cognition;Decreased knowledge of use of DME;Decreased safety awareness;Decreased skin integrity;Pain       PT Treatment Interventions DME instruction;Gait training;Functional mobility training;Therapeutic activities;Therapeutic exercise;Balance training;Neuromuscular re-education;Cognitive remediation;Patient/family education    PT Goals (Current goals can be found in the Care Plan section)  Acute Rehab PT Goals Patient Stated Goal: agreeable to SNF recommendation PT Goal Formulation: With patient Time  For Goal Achievement: 11/27/20 Potential to Achieve Goals: Good    Frequency Min 2X/week   Barriers to discharge        Co-evaluation               AM-PAC PT "6 Clicks" Mobility  Outcome Measure Help needed turning from your back to your side while in a flat bed without using bedrails?: A Lot Help needed moving from lying on your back to sitting on the side of a flat bed without using bedrails?: A Lot Help needed moving to and from a bed to a chair (including a wheelchair)?: A Lot Help needed standing up from a chair using your arms (e.g., wheelchair or bedside chair)?: A Little Help needed to walk in hospital room?: A Lot Help needed climbing 3-5 steps with a railing? : A Lot 6 Click Score: 13    End of Session   Activity Tolerance: Patient tolerated treatment well;Other (comment) (limited by hunger) Patient left: in bed;with call bell/phone within reach Nurse Communication: Mobility status;Other (comment) (requesting lunch tray) PT Visit Diagnosis: Unsteadiness on feet (R26.81);Other abnormalities of gait and mobility (R26.89);Repeated falls (R29.6);History of falling (Z91.81)    Time: 7262-0355 PT Time Calculation (min) (ACUTE ONLY): 28 min   Charges:   PT Evaluation $PT Eval Low Complexity: 1 Low PT Treatments $Therapeutic Activity: 8-22 mins         Tori Haldon Carley PT, DPT 11/13/20, 3:54 PM

## 2020-11-13 NOTE — Progress Notes (Addendum)
TOC CM contacted [employer] for SNF rehab auth and ambulance auth. [place] left message for review for SNF. Contacted PT for evaluation. Pt has PT eval from 11/11/2020. [place], [place] ED TOC CM [phone]

## 2020-11-13 NOTE — Telephone Encounter (Signed)
11/12/2020 1200 TOC CM received call from pt's son, Lennette Bihari. States pt lives alone and unable to care for herself and unable to provide 24 hour care. Explained that Alvis Lemmings was arranged. Contacted [facility] and they were not able to reach patient to set up [facility]. Alvis Lemmings will contact son to arrange [facility] visit. Son wants pt to go to SNF rehab at [facility]. Explained Alvis Lemmings can work with PCP to set up SNF rehab. [place], [place] ED TOC CM [phone]

## 2020-11-13 NOTE — Progress Notes (Addendum)
TOC CSW received a return call from Kevin/pts son in regards to dc plan.  CSW disclosed to Lennette Bihari that he would need to seek the assistance of PCP to place pt at a SNF and/or ALF.  CSW reiterated with Lennette Bihari that [place] and [facility] would be reaching out to him for assistance with future placement.  Lennette Bihari stated pt did not want to come stay with him at his home or his rental, therefore that was an inconvenience for his living and working situation.  Lennette Bihari was willing to stay with pt at night and his son/pts grandson is willing to stay during the day on previous admit.  Lennette Bihari states he told by PCP to not pick up pt and pt would be placed at [facility].  CSW disclosed to Lennette Bihari that pt cld not stay in ED because she had been dc'd.  Lennette Bihari refused to pick up pt stating Dr. Louanne Belton stated pt wld be placed from ED and not to pick up pt.  Chailyn Racette Tarpley-Carter, MSW, LCSW-A Pronouns:  She, Her, Hers                  [place] ED Transitions of CareClinical Social Worker [email or web address] [phone]

## 2020-11-13 NOTE — Discharge Planning (Signed)
Pt currently active with [facility] for [place] services as confirmed by [facility] with Tommi Rumps of [facility].  Pt will resume [place] services of RN.PT, SW.  No DME needs identified at this time.

## 2020-11-13 NOTE — Progress Notes (Signed)
PT Cancellation Note  Patient Details Name: Debra Short MRN: [id number] DOB: [birth year]   Cancelled Treatment:    Reason Eval/Treat Not Completed: Medical issues which prohibited therapy (BP 148/133). Upon entry into room, pt's BP 134/101 and pt reports having a headache. Pt alert and oriented, able to state name, date, location, situation all correctly. Rechecked pt's BP and found to be 148/133- RN notified. Pt with BP not in therapeutic range and reporting headache, will check back when appropriate.    Talbot Grumbling PT, DPT 11/13/20, 2:58 PM

## 2020-11-14 LAB — SARS CORONAVIRUS 2 (TAT 6-24 HRS): SARS Coronavirus 2: NEGATIVE

## 2020-11-14 MED ORDER — ACETAMINOPHEN 325 MG PO TABS
650.0000 mg | ORAL_TABLET | Freq: Every day | ORAL | Status: AC
  Administered 2020-11-14 – 2020-11-15 (×3): 650 mg via ORAL
  Filled 2020-11-14 (×3): qty 2

## 2020-11-14 MED ORDER — ACETAMINOPHEN ER 650 MG PO TBCR: 650.0000 mg | EXTENDED_RELEASE_TABLET | ORAL | Status: DC

## 2020-11-14 MED ORDER — ACETAMINOPHEN 325 MG PO TABS: 650.0000 mg | ORAL_TABLET | Freq: Every day | ORAL | Status: DC | PRN

## 2020-11-14 MED ORDER — ACETAMINOPHEN ER 650 MG PO TBCR: 650.0000 mg | EXTENDED_RELEASE_TABLET | Freq: Every day | ORAL | Status: DC

## 2020-11-14 NOTE — Progress Notes (Signed)
CSW spoke with Nicole Kindred who is the Scientist, physiological at [facility]. CSW verified if patient still had her bed. Mr. Nicole Kindred stated yes and that patient will not be able to come today. Mr. Nicole Kindred stated there have been some covid outbreaks and they are also waiting on some discharges. Mr. Nicole Kindred stated whoever is working tomorrow can follow-up with them to see if a bed is ready. Insurance authorization is pending.

## 2020-11-14 NOTE — ED Notes (Signed)
Pt received breakfast tray 

## 2020-11-14 NOTE — Progress Notes (Signed)
Insurance authorization approved.

## 2020-11-14 NOTE — ED Notes (Signed)
Pt in bed resting. Respirations even and unlabored.

## 2020-11-15 NOTE — ED Notes (Signed)
I gave patient her dinner tray

## 2020-11-15 NOTE — Progress Notes (Signed)
TOC CSW attempted to contact Tony/Administrator at [facility] in regards to pts admission.  CSW spoke with Vinnie Level [phone].  Vinnie Level stated she would have Nicole Kindred give me a call in regards to pts possible admission today.  Ambur Province Tarpley-Carter, MSW, LCSW-A Pronouns:  She, Her, Hers                  [place] ED Transitions of CareClinical Social Worker [email or web address] [phone]

## 2020-11-15 NOTE — ED Notes (Signed)
Maylon Cos, RN assuming care of this pt

## 2020-11-15 NOTE — Progress Notes (Signed)
TOC CSW received a return call from Tony/Administrator at [facility].  Nicole Kindred stated they had another person test positive for Covid, but would be able to accept pt tomorrow (11/16/2020), contact [place] on Monday.  Joud Pettinato Tarpley-Carter, MSW, LCSW-A Pronouns:  She, Her, [place] ED Transitions of CareClinical Social Worker [email or web address] [phone]

## 2020-11-15 NOTE — ED Notes (Signed)
Patient did not want me to take her dinner tray and did not want to be moved nurse made aware

## 2020-11-15 NOTE — ED Notes (Signed)
I gave patient her lunch tray

## 2020-11-15 NOTE — ED Notes (Signed)
I offer patient a bath however she declined

## 2020-11-15 NOTE — ED Notes (Signed)
Patient just woke up so I gave her a tray

## 2020-11-16 ENCOUNTER — Telehealth: Payer: Self-pay | Admitting: Surgery

## 2020-11-16 DIAGNOSIS — S0003XA Contusion of scalp, initial encounter: Secondary | ICD-10-CM | POA: Diagnosis not present

## 2020-11-16 DIAGNOSIS — M5031 Other cervical disc degeneration,  high cervical region: Secondary | ICD-10-CM | POA: Diagnosis not present

## 2020-11-16 DIAGNOSIS — S0990XA Unspecified injury of head, initial encounter: Secondary | ICD-10-CM | POA: Diagnosis present

## 2020-11-16 DIAGNOSIS — F3341 Major depressive disorder, recurrent, in partial remission: Secondary | ICD-10-CM | POA: Diagnosis not present

## 2020-11-16 DIAGNOSIS — I10 Essential (primary) hypertension: Secondary | ICD-10-CM | POA: Diagnosis not present

## 2020-11-16 DIAGNOSIS — W19XXXD Unspecified fall, subsequent encounter: Secondary | ICD-10-CM | POA: Diagnosis not present

## 2020-11-16 DIAGNOSIS — I129 Hypertensive chronic kidney disease with stage 1 through stage 4 chronic kidney disease, or unspecified chronic kidney disease: Secondary | ICD-10-CM | POA: Diagnosis not present

## 2020-11-16 DIAGNOSIS — E559 Vitamin D deficiency, unspecified: Secondary | ICD-10-CM | POA: Diagnosis not present

## 2020-11-16 DIAGNOSIS — G629 Polyneuropathy, unspecified: Secondary | ICD-10-CM | POA: Diagnosis not present

## 2020-11-16 DIAGNOSIS — R5381 Other malaise: Secondary | ICD-10-CM | POA: Diagnosis not present

## 2020-11-16 DIAGNOSIS — S0181XD Laceration without foreign body of other part of head, subsequent encounter: Secondary | ICD-10-CM | POA: Diagnosis not present

## 2020-11-16 DIAGNOSIS — S065X9A Traumatic subdural hemorrhage with loss of consciousness of unspecified duration, initial encounter: Secondary | ICD-10-CM | POA: Diagnosis not present

## 2020-11-16 DIAGNOSIS — F29 Unspecified psychosis not due to a substance or known physiological condition: Secondary | ICD-10-CM | POA: Diagnosis not present

## 2020-11-16 DIAGNOSIS — R112 Nausea with vomiting, unspecified: Secondary | ICD-10-CM | POA: Diagnosis not present

## 2020-11-16 DIAGNOSIS — C8590 Non-Hodgkin lymphoma, unspecified, unspecified site: Secondary | ICD-10-CM | POA: Diagnosis not present

## 2020-11-16 DIAGNOSIS — K30 Functional dyspepsia: Secondary | ICD-10-CM | POA: Diagnosis not present

## 2020-11-16 DIAGNOSIS — N182 Chronic kidney disease, stage 2 (mild): Secondary | ICD-10-CM | POA: Diagnosis not present

## 2020-11-16 DIAGNOSIS — M50322 Other cervical disc degeneration at C5-C6 level: Secondary | ICD-10-CM | POA: Diagnosis not present

## 2020-11-16 DIAGNOSIS — R32 Unspecified urinary incontinence: Secondary | ICD-10-CM | POA: Diagnosis not present

## 2020-11-16 DIAGNOSIS — E119 Type 2 diabetes mellitus without complications: Secondary | ICD-10-CM | POA: Diagnosis not present

## 2020-11-16 DIAGNOSIS — S0083XA Contusion of other part of head, initial encounter: Secondary | ICD-10-CM | POA: Diagnosis not present

## 2020-11-16 DIAGNOSIS — F32A Depression, unspecified: Secondary | ICD-10-CM | POA: Diagnosis not present

## 2020-11-16 DIAGNOSIS — I959 Hypotension, unspecified: Secondary | ICD-10-CM | POA: Diagnosis not present

## 2020-11-16 DIAGNOSIS — Z7984 Long term (current) use of oral hypoglycemic drugs: Secondary | ICD-10-CM | POA: Diagnosis not present

## 2020-11-16 DIAGNOSIS — Z741 Need for assistance with personal care: Secondary | ICD-10-CM | POA: Diagnosis not present

## 2020-11-16 DIAGNOSIS — W182XXA Fall in (into) shower or empty bathtub, initial encounter: Secondary | ICD-10-CM | POA: Diagnosis not present

## 2020-11-16 DIAGNOSIS — Z9181 History of falling: Secondary | ICD-10-CM | POA: Diagnosis not present

## 2020-11-16 DIAGNOSIS — G43909 Migraine, unspecified, not intractable, without status migrainosus: Secondary | ICD-10-CM | POA: Diagnosis not present

## 2020-11-16 DIAGNOSIS — K582 Mixed irritable bowel syndrome: Secondary | ICD-10-CM | POA: Diagnosis not present

## 2020-11-16 DIAGNOSIS — Y92009 Unspecified place in unspecified non-institutional (private) residence as the place of occurrence of the external cause: Secondary | ICD-10-CM | POA: Diagnosis not present

## 2020-11-16 DIAGNOSIS — M797 Fibromyalgia: Secondary | ICD-10-CM | POA: Diagnosis not present

## 2020-11-16 DIAGNOSIS — E785 Hyperlipidemia, unspecified: Secondary | ICD-10-CM | POA: Diagnosis not present

## 2020-11-16 DIAGNOSIS — M6281 Muscle weakness (generalized): Secondary | ICD-10-CM | POA: Diagnosis not present

## 2020-11-16 DIAGNOSIS — E1165 Type 2 diabetes mellitus with hyperglycemia: Secondary | ICD-10-CM | POA: Diagnosis not present

## 2020-11-16 DIAGNOSIS — M47812 Spondylosis without myelopathy or radiculopathy, cervical region: Secondary | ICD-10-CM | POA: Diagnosis not present

## 2020-11-16 DIAGNOSIS — C8330 Diffuse large B-cell lymphoma, unspecified site: Secondary | ICD-10-CM | POA: Diagnosis not present

## 2020-11-16 DIAGNOSIS — E1122 Type 2 diabetes mellitus with diabetic chronic kidney disease: Secondary | ICD-10-CM | POA: Diagnosis not present

## 2020-11-16 DIAGNOSIS — M50321 Other cervical disc degeneration at C4-C5 level: Secondary | ICD-10-CM | POA: Diagnosis not present

## 2020-11-16 DIAGNOSIS — K219 Gastro-esophageal reflux disease without esophagitis: Secondary | ICD-10-CM | POA: Diagnosis not present

## 2020-11-16 DIAGNOSIS — K589 Irritable bowel syndrome without diarrhea: Secondary | ICD-10-CM | POA: Diagnosis not present

## 2020-11-16 DIAGNOSIS — R41841 Cognitive communication deficit: Secondary | ICD-10-CM | POA: Diagnosis not present

## 2020-11-16 DIAGNOSIS — Z043 Encounter for examination and observation following other accident: Secondary | ICD-10-CM | POA: Diagnosis not present

## 2020-11-16 DIAGNOSIS — R262 Difficulty in walking, not elsewhere classified: Secondary | ICD-10-CM | POA: Diagnosis not present

## 2020-11-16 DIAGNOSIS — S0101XA Laceration without foreign body of scalp, initial encounter: Secondary | ICD-10-CM | POA: Diagnosis not present

## 2020-11-16 LAB — RESP PANEL BY RT-PCR (FLU A&B, COVID) ARPGX2
Influenza A by PCR: NEGATIVE
Influenza B by PCR: NEGATIVE
SARS Coronavirus 2 by RT PCR: NEGATIVE

## 2020-11-16 NOTE — Progress Notes (Signed)
TOC CSW currently awaiting COVID results.  AVS has been faxed to Janie/[facility] [phone].  Awaiting room # and call report from [facility].  Brittnee Gaetano Tarpley-Carter, MSW, LCSW-A Pronouns:  She, Her, Hers                  [place] ED Transitions of CareClinical Social Worker [email or web address] [phone]

## 2020-11-16 NOTE — Progress Notes (Signed)
TOC CSW has been notified by Tony/[facility] that paperwork requested was received and admission information has been given.  This information will be shared with Ria Comment, Therapist, sports.  Room #:  3213  Call Report #:  [phone]  Korey Arroyo Tarpley-Carter, MSW, LCSW-A Pronouns:  She, Her, Hers                  [place] ED Transitions of CareClinical Social Worker [email or web address] [phone]

## 2020-11-16 NOTE — ED Notes (Signed)
Called Debra Short to advise them transport was here to take pt to SNF and they would be arriving w pt shortly, secretary said she would let RN know

## 2020-11-16 NOTE — ED Notes (Signed)
Brought pt breakfast trey, gave meds, pt resting, chest rising and falling, pt in NAD

## 2020-11-16 NOTE — ED Notes (Signed)
[facility] called to transport pt to SNF

## 2020-11-16 NOTE — Telephone Encounter (Signed)
[facility] ED RNCM received call from [place] concerning the facility needing a clarification on medications.  RNCM reviewed medications and sent message to  [facility] EDP for clarification

## 2020-11-16 NOTE — ED Notes (Signed)
Fall bundle  Yellow Wrist band  Yellow Socks  Bed Alarm on  Fall risk sign outside of pt's door

## 2020-11-16 NOTE — ED Notes (Signed)
Full set of clean sheets, clean purwick, clean brief, clean gown, and pt assist pericare, repositioned pt.

## 2020-11-16 NOTE — ED Notes (Signed)
Attempted to call report to Ritta Slot, was told RN would have to call me back to get report, gave secretary my name and ph#

## 2020-11-16 NOTE — ED Notes (Signed)
Pt left w [facility] w dc papers, purse and glasses

## 2020-11-16 NOTE — Telephone Encounter (Signed)
Received secured chat message medication clarification per Dr. Gilford Raid Xanax 0.5 mg bid prn severe anxiety; Lipitor 40 mg qhs; 500mg  vitamin C  every day Vit D 1076mcg daily. Whomever is the provider over there can review in the morning to see if they want any other changes.

## 2020-11-16 NOTE — Discharge Instructions (Addendum)
Patient presented to the emergency department for falls.  Lives by herself.  Head CT was repeated without any acute findings.  Patient had some nausea and vomiting.  Work-up for that without any significant findings.  Thought maybe there was some mild postconcussive syndrome going on.  Patient was deemed not to be safe for discharge home since she lives by herself.  And they had worked on nursing home placement.  Initial COVID testing was negative.  Repeated today for discharge to nursing facility.  Medications as listed on AVS. patient has been accepted by [facility] nursing facility.

## 2020-11-16 NOTE — Progress Notes (Addendum)
TOC CSW contacted [employer] for auth information.  Auth#:  [id number]  Start date:  11/13/2020  Information has been shared with [facility]ie/[facility].  CSW has requested a new COVID test be done (must be within 24-48 hours per facility).   Kinsey Cowsert Tarpley-Carter, MSW, LCSW-A Pronouns:  She, Her, Hers                  [place] ED Transitions of CareClinical Social Worker [email or web address] [phone]

## 2020-11-16 NOTE — Progress Notes (Signed)
TOC CSW attempted to contact [facility] for pts possible admit today.  CSW left HIPPA compliant message with my contact information.  Ivylynn Hoppes Tarpley-Carter, MSW, LCSW-A Pronouns:  She, Her, Hers                  [place] ED Transitions of CareClinical Social Worker [email or web address] [phone]

## 2020-11-16 NOTE — ED Notes (Signed)
Gave report to [employer] from [facility]

## 2020-11-16 NOTE — ED Provider Notes (Signed)
Patient accepted by [facility] nursing home.   Fredia Sorrow, MD 11/16/20 [phone]

## 2020-11-17 DIAGNOSIS — E119 Type 2 diabetes mellitus without complications: Secondary | ICD-10-CM | POA: Diagnosis not present

## 2020-11-17 DIAGNOSIS — M6281 Muscle weakness (generalized): Secondary | ICD-10-CM | POA: Diagnosis not present

## 2020-11-17 DIAGNOSIS — G43909 Migraine, unspecified, not intractable, without status migrainosus: Secondary | ICD-10-CM | POA: Diagnosis not present

## 2020-11-17 DIAGNOSIS — F32A Depression, unspecified: Secondary | ICD-10-CM | POA: Diagnosis not present

## 2020-11-17 DIAGNOSIS — G629 Polyneuropathy, unspecified: Secondary | ICD-10-CM | POA: Diagnosis not present

## 2020-11-17 DIAGNOSIS — E785 Hyperlipidemia, unspecified: Secondary | ICD-10-CM | POA: Diagnosis not present

## 2020-11-17 DIAGNOSIS — M797 Fibromyalgia: Secondary | ICD-10-CM | POA: Diagnosis not present

## 2020-11-17 DIAGNOSIS — I10 Essential (primary) hypertension: Secondary | ICD-10-CM | POA: Diagnosis not present

## 2020-11-17 DIAGNOSIS — K30 Functional dyspepsia: Secondary | ICD-10-CM | POA: Diagnosis not present

## 2020-11-17 DIAGNOSIS — K582 Mixed irritable bowel syndrome: Secondary | ICD-10-CM | POA: Diagnosis not present

## 2020-11-17 DIAGNOSIS — C8330 Diffuse large B-cell lymphoma, unspecified site: Secondary | ICD-10-CM | POA: Diagnosis not present

## 2020-11-17 DIAGNOSIS — R32 Unspecified urinary incontinence: Secondary | ICD-10-CM | POA: Diagnosis not present

## 2020-11-18 DIAGNOSIS — R112 Nausea with vomiting, unspecified: Secondary | ICD-10-CM | POA: Diagnosis not present

## 2020-11-18 DIAGNOSIS — E1165 Type 2 diabetes mellitus with hyperglycemia: Secondary | ICD-10-CM | POA: Diagnosis not present

## 2020-11-18 DIAGNOSIS — E785 Hyperlipidemia, unspecified: Secondary | ICD-10-CM | POA: Diagnosis not present

## 2020-11-18 DIAGNOSIS — S065X9A Traumatic subdural hemorrhage with loss of consciousness of unspecified duration, initial encounter: Secondary | ICD-10-CM | POA: Diagnosis not present

## 2020-11-19 DIAGNOSIS — G629 Polyneuropathy, unspecified: Secondary | ICD-10-CM | POA: Diagnosis not present

## 2020-11-19 DIAGNOSIS — M797 Fibromyalgia: Secondary | ICD-10-CM | POA: Diagnosis not present

## 2020-11-19 DIAGNOSIS — M6281 Muscle weakness (generalized): Secondary | ICD-10-CM | POA: Diagnosis not present

## 2020-11-19 DIAGNOSIS — E119 Type 2 diabetes mellitus without complications: Secondary | ICD-10-CM | POA: Diagnosis not present

## 2020-11-19 DIAGNOSIS — I10 Essential (primary) hypertension: Secondary | ICD-10-CM | POA: Diagnosis not present

## 2020-12-02 DIAGNOSIS — F32A Depression, unspecified: Secondary | ICD-10-CM | POA: Diagnosis not present

## 2020-12-02 DIAGNOSIS — I10 Essential (primary) hypertension: Secondary | ICD-10-CM | POA: Diagnosis not present

## 2020-12-02 DIAGNOSIS — M6281 Muscle weakness (generalized): Secondary | ICD-10-CM | POA: Diagnosis not present

## 2020-12-02 DIAGNOSIS — G629 Polyneuropathy, unspecified: Secondary | ICD-10-CM | POA: Diagnosis not present

## 2020-12-02 DIAGNOSIS — M797 Fibromyalgia: Secondary | ICD-10-CM | POA: Diagnosis not present

## 2020-12-02 DIAGNOSIS — C8330 Diffuse large B-cell lymphoma, unspecified site: Secondary | ICD-10-CM | POA: Diagnosis not present

## 2020-12-10 ENCOUNTER — Other Ambulatory Visit: Payer: Self-pay

## 2020-12-10 ENCOUNTER — Encounter (HOSPITAL_BASED_OUTPATIENT_CLINIC_OR_DEPARTMENT_OTHER): Payer: Self-pay

## 2020-12-10 ENCOUNTER — Emergency Department (HOSPITAL_BASED_OUTPATIENT_CLINIC_OR_DEPARTMENT_OTHER)
Admission: EM | Admit: 2020-12-10 | Discharge: 2020-12-10 | Disposition: A | Discharge status: Home / Self Care | Payer: PPO | Attending: Emergency Medicine | Admitting: Emergency Medicine

## 2020-12-10 ENCOUNTER — Emergency Department (HOSPITAL_BASED_OUTPATIENT_CLINIC_OR_DEPARTMENT_OTHER): Payer: PPO

## 2020-12-10 DIAGNOSIS — Z7984 Long term (current) use of oral hypoglycemic drugs: Secondary | ICD-10-CM | POA: Insufficient documentation

## 2020-12-10 DIAGNOSIS — M5031 Other cervical disc degeneration,  high cervical region: Secondary | ICD-10-CM | POA: Diagnosis not present

## 2020-12-10 DIAGNOSIS — M6281 Muscle weakness (generalized): Secondary | ICD-10-CM | POA: Diagnosis not present

## 2020-12-10 DIAGNOSIS — G629 Polyneuropathy, unspecified: Secondary | ICD-10-CM | POA: Diagnosis not present

## 2020-12-10 DIAGNOSIS — Y92009 Unspecified place in unspecified non-institutional (private) residence as the place of occurrence of the external cause: Secondary | ICD-10-CM | POA: Insufficient documentation

## 2020-12-10 DIAGNOSIS — E1122 Type 2 diabetes mellitus with diabetic chronic kidney disease: Secondary | ICD-10-CM | POA: Diagnosis not present

## 2020-12-10 DIAGNOSIS — N182 Chronic kidney disease, stage 2 (mild): Secondary | ICD-10-CM | POA: Diagnosis not present

## 2020-12-10 DIAGNOSIS — I129 Hypertensive chronic kidney disease with stage 1 through stage 4 chronic kidney disease, or unspecified chronic kidney disease: Secondary | ICD-10-CM | POA: Diagnosis not present

## 2020-12-10 DIAGNOSIS — W19XXXD Unspecified fall, subsequent encounter: Secondary | ICD-10-CM | POA: Diagnosis not present

## 2020-12-10 DIAGNOSIS — M47812 Spondylosis without myelopathy or radiculopathy, cervical region: Secondary | ICD-10-CM | POA: Diagnosis not present

## 2020-12-10 DIAGNOSIS — S0101XA Laceration without foreign body of scalp, initial encounter: Secondary | ICD-10-CM | POA: Insufficient documentation

## 2020-12-10 DIAGNOSIS — W182XXA Fall in (into) shower or empty bathtub, initial encounter: Secondary | ICD-10-CM | POA: Insufficient documentation

## 2020-12-10 DIAGNOSIS — S0003XA Contusion of scalp, initial encounter: Secondary | ICD-10-CM | POA: Diagnosis not present

## 2020-12-10 DIAGNOSIS — M50322 Other cervical disc degeneration at C5-C6 level: Secondary | ICD-10-CM | POA: Diagnosis not present

## 2020-12-10 DIAGNOSIS — Z043 Encounter for examination and observation following other accident: Secondary | ICD-10-CM | POA: Diagnosis not present

## 2020-12-10 DIAGNOSIS — M50321 Other cervical disc degeneration at C4-C5 level: Secondary | ICD-10-CM | POA: Diagnosis not present

## 2020-12-10 NOTE — ED Notes (Signed)
Phone report given to LPN at [facility] facility. See charting. Patient to be transported via [place].

## 2020-12-10 NOTE — ED Notes (Signed)
Called [facility] to transport patient back to [facility]

## 2020-12-10 NOTE — ED Triage Notes (Signed)
Patient BIB [facility] from [facility] for a Fall.  Patient was taking a Shower when she slipped and fell backwards, hitting her head.  Patient has 2-3 cm laceration to Posterior Head with Bleeding Controlled.  Minimal Pain to Posterior Head. GCS 15. A&Ox4. No History of Blood Thinners.

## 2020-12-10 NOTE — ED Notes (Signed)
Patient waiting for transport back to [facility] via [place]. No distress noted.

## 2020-12-10 NOTE — ED Notes (Signed)
Patient transported to CT 

## 2020-12-10 NOTE — ED Provider Notes (Signed)
[facility] EMERGENCY DEPT Provider Note   CSN: [id number] Arrival date & time: 12/10/20  6967     History Chief Complaint  Patient presents with   Fall   Laceration    Head    Debra Short is a 80 y.o. female.  The history is provided by the patient.  Fall  Laceration She has history of hypertension, diabetes, hyperlipidemia, subdural hematoma, non-Hodgkin's lymphoma and comes in following a fall at home.  She slipped getting out of her shower and hit the back of her head causing a laceration.  She denies loss of consciousness.  She denies any nausea or vomiting.  She has had problems with falls recently.   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   Nervous 02/29/2020   Aortic atherosclerosis ([place]) by CXR in 2019 12/11/2019   Osteopenia 11/26/2018   OAB (overactive bladder) 05/14/2018   BMI 24.0-24.9, adult 05/09/2018   Depression, major, recurrent, 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   Degenerative disc disease, lumbar 07/20/2016   Primary osteoarthritis of both knees 07/20/2016   Fibromyalgia syndrome 07/20/2016   CKD stage 2 due to type 2 diabetes mellitus ([place]) 10/04/2015   Insomnia 10/06/2014   Medication management 10/09/2013   Hyperlipidemia associated with type 2 diabetes mellitus ([place])    Essential hypertension    Gastroesophageal reflux disease    Vitamin D deficiency    IBS (irritable bowel syndrome)    Type 2 diabetes mellitus with stage 2 chronic kidney disease, without long-term current use of insulin ([place])    Non Hodgkin's lymphoma ([place])     Past Surgical History:  Procedure Laterality Date    ABDOMINAL HYSTERECTOMY     ORIF ANKLE FRACTURE Left 09/26/2014   ORIF ANKLE FRACTURE Left 09/26/2014   Procedure: OPEN REDUCTION INTERNAL FIXATION (ORIF) ANKLE FRACTURE;  Surgeon: [name], MD;  Location: [facility];  Service: Orthopedics;  Laterality: Left;   TONSILLECTOMY       OB History   No obstetric history on file.     Family History  Problem Relation Age of Onset   Cancer Mother    Cancer Father    Cancer Brother    Cancer Maternal Grandfather     Social History   Tobacco Use   Smoking status: Never   Smokeless tobacco: Never  Vaping Use   Vaping Use: Never used  Substance Use Topics   Alcohol use: No   Drug use: No    Home Medications Prior to Admission medications   Medication Sig Start Date End Date Taking? Authorizing Provider  acetaminophen (TYLENOL) 650 MG CR tablet Take 650 mg by mouth See admin instructions. Take 650 mg by mouth at bedtime and an additional 650 mg during the day as needed for pain    [provider]  ALPRAZolam (XANAX) 1 MG tablet TAKE 1/2 - 1 TABLET 2 - 3 X /DAY ONLY IF NEEDED FOR ANXIETY ATTACK & LIMIT TO 5 DAYS /WEEK TO AVOID ADDICTION & DEMENTIA Patient taking differently: Take 0.5-1 mg by mouth See admin instructions. Take 0.5-1 mg by mouth two  to to three times a day "only if needed for an anxiety attack and limit to 5 days a week to avoid addiction and dementia" 09/24/20   Liane Comber, NP  atorvastatin (LIPITOR) 80 MG tablet TAKE 1/3 TABLET BY MOUTH DAILY FOR CHOLESTEROL. DISCONTINUE PRAVASTATIN Patient not taking: Reported on 11/13/2020 02/25/20   Liane Comber, NP  Cholecalciferol (VITAMIN D-3 PO) Take 1-2 capsules by mouth daily.    [provider]  esomeprazole (NEXIUM) 40 MG capsule Take     1 capsule     Daily     to Prevent Heartburn & Indigestion Patient not taking: No sig reported 03/13/20   Unk [name], MD  FLUoxetine (PROZAC) 40 MG capsule TAKE 1 CAPSULE DAILY FOR MOOD Patient taking differently:  Take 40 mg by mouth daily in the afternoon. 02/11/20   Liane Comber, NP  fluticasone (FLONASE) 50 MCG/ACT nasal spray Place 1-2 sprays into both nostrils daily as needed for allergies or rhinitis. 09/14/20   Liane Comber, NP  gabapentin (NEURONTIN) 300 MG capsule Take  2 capsules  2 x /day  for Pain Patient taking differently: Take 300 mg by mouth See admin instructions. Take 300 mg by mouth at bedtime and an additional 300 mg one to three times a day as needed for pain 07/20/20   Unk [name], MD  metFORMIN (GLUCOPHAGE-XR) 500 MG 24 hr tablet TAKE 2 TABLETS BY MOUTH 2 X /DAY WITH MEALS FOR DIABETES Patient taking differently: Take 500 mg by mouth 3 (three) times daily. 09/12/20   Liane Comber, NP  montelukast (SINGULAIR) 10 MG tablet TAKE 1 TABLET BY MOUTH DAILY FOR ALLERGIES Patient not taking: No sig reported 06/18/20   Liane Comber, NP  Multiple Vitamins-Minerals (ICAPS) CAPS Take 1 capsule by mouth daily with lunch.    [provider]  Multiple Vitamins-Minerals (ONE-A-DAY PROACTIVE 65+) TABS Take 1 tablet by mouth daily with breakfast.    [provider]  Propylene Glycol (SYSTANE BALANCE OP) Place 1 drop into both eyes 4 (four) times daily as needed (dry eyes).    [provider]  solifenacin (VESICARE) 10 MG tablet TAKE 1 TABLET BY MOUTH ONCE DAILY FOR BLADDER Patient not taking: No sig reported 07/22/20   Garnet Sierras, NP    Allergies    Effexor [venlafaxine], Nortriptyline, Other, Paxil [paroxetine hcl], Robaxin [methocarbamol], Tizanidine, and Zoloft [sertraline hcl]  Review of Systems   Review of Systems  All other systems reviewed and are negative.  Physical Exam Updated Vital Signs BP (!) 175/91 (BP Location: Right Arm)   Pulse 78   Temp 98.1 F (36.7 C) (Oral)   Resp 16   Ht 5\' 6"  (1.676 m)   Wt 72.6 kg   SpO2 100%   BMI 25.83 kg/m   Physical Exam Vitals and nursing note reviewed.  80 year old female, resting comfortably and in  no acute distress. Vital signs are significant for elevated blood pressure. Oxygen saturation is 100%, which is normal. Head is normocephalic .  Laceration is present on the occiput. PERRLA, EOMI. Oropharynx is clear. Neck is nontender without adenopathy or JVD. Back is nontender and there is no CVA tenderness. Lungs are clear without rales, wheezes, or rhonchi. Chest is nontender. Heart has regular rate and rhythm without murmur. Abdomen is soft, flat, nontender without masses or hepatosplenomegaly and peristalsis is normoactive. Extremities have no cyanosis or edema, full range of motion is present. Skin is warm and dry without rash. Neurologic: Mental status is normal, cranial nerves  are intact.  Strength is 5/5 in both arms and both legs.  ED Results / Procedures / Treatments    Radiology CT Head Wo Contrast  Result Date: 12/10/2020 CLINICAL DATA:  Unwitnessed fall, head injury, scalp laceration EXAM: CT HEAD WITHOUT CONTRAST CT CERVICAL SPINE WITHOUT CONTRAST TECHNIQUE: Multidetector CT imaging of the head and cervical spine was performed following the standard protocol without intravenous contrast. Multiplanar CT image reconstructions of the cervical spine were also generated. COMPARISON:  CT head 11/12/2020, CT cervical spine 11/02/2020 FINDINGS: CT HEAD FINDINGS Brain: Normal anatomic configuration. Parenchymal volume loss is commensurate with the patient's age. Mild periventricular white matter changes are present likely reflecting the sequela of small vessel ischemia. No abnormal intra or extra-axial mass lesion or fluid collection. No abnormal mass effect or midline shift. No evidence of acute intracranial hemorrhage or infarct. Ventricular size is normal. Cerebellum unremarkable. Vascular: No asymmetric hyperdense vasculature at the skull base. Skull: Intact Sinuses/Orbits: Paranasal sinuses are clear. Orbits are unremarkable. Other: Mastoid air cells and middle ear cavities are clear.  Small frontal scalp hematoma appears improved since prior examination. CT CERVICAL SPINE FINDINGS Alignment: Reversal of the normal cervical lordosis is again identified. There is 3-4 mm anterolisthesis of C3 upon C4, C4 upon C5, and C5 upon C6, all stable since prior examination and likely degenerative in nature. Skull base and vertebrae: Craniocervical alignment is normal. The atlantodental interval is not widened. Degenerative changes are noted at the atlantodental articulation. No acute fracture of the cervical spine. Soft tissues and spinal canal: No prevertebral fluid or swelling. No visible canal hematoma. Disc levels: There is intervertebral disc space narrowing and endplate remodeling of a C3-C7, most severe at C5-C7 in keeping with changes of moderate to severe degenerative disc disease. Vertebral body height has been preserved. The prevertebral soft tissues are not thickened. Review of the axial images demonstrates multilevel uncovertebral and facet arthrosis resulting in multilevel moderate to severe neuroforaminal narrowing, most severe at C3-4, C4-5, and C5-6. Upper chest: Changes of degenerative disc disease are noted at T2-3 and T3-4. Visualized lung apices demonstrate scarring within the left upper lobe. Moderate atherosclerotic calcification within the thoracic aorta. Other: None IMPRESSION: No acute intracranial injury. No calvarial fracture. Resolving frontal scalp hematoma. No acute fracture or listhesis of the cervical spine. 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 Electronically Signed   By: Fidela Salisbury MD   On: 12/10/2020 01:33   CT Cervical Spine Wo Contrast  Result Date: 12/10/2020 CLINICAL DATA:  Unwitnessed fall, head injury, scalp laceration EXAM: CT HEAD WITHOUT CONTRAST CT CERVICAL SPINE WITHOUT CONTRAST TECHNIQUE: Multidetector CT imaging of the head and cervical spine was  performed following the standard protocol without intravenous contrast. Multiplanar CT image reconstructions of the cervical spine were also generated. COMPARISON:  CT head 11/12/2020, CT cervical spine 11/02/2020 FINDINGS: CT HEAD FINDINGS Brain: Normal anatomic configuration. Parenchymal volume loss is commensurate with the patient's age. Mild periventricular white matter changes are present likely reflecting the sequela of small vessel ischemia. No abnormal intra or extra-axial mass lesion or fluid collection. No abnormal mass effect or midline shift. No evidence of acute intracranial hemorrhage or infarct. Ventricular size is normal. Cerebellum unremarkable. Vascular: No asymmetric hyperdense vasculature at the skull base. Skull: Intact Sinuses/Orbits: Paranasal sinuses are clear. Orbits are unremarkable. Other: Mastoid air cells and middle ear cavities are clear. Small frontal scalp hematoma appears improved since prior examination. CT CERVICAL SPINE  FINDINGS Alignment: Reversal of the normal cervical lordosis is again identified. There is 3-4 mm anterolisthesis of C3 upon C4, C4 upon C5, and C5 upon C6, all stable since prior examination and likely degenerative in nature. Skull base and vertebrae: Craniocervical alignment is normal. The atlantodental interval is not widened. Degenerative changes are noted at the atlantodental articulation. No acute fracture of the cervical spine. Soft tissues and spinal canal: No prevertebral fluid or swelling. No visible canal hematoma. Disc levels: There is intervertebral disc space narrowing and endplate remodeling of a C3-C7, most severe at C5-C7 in keeping with changes of moderate to severe degenerative disc disease. Vertebral body height has been preserved. The prevertebral soft tissues are not thickened. Review of the axial images demonstrates multilevel uncovertebral and facet arthrosis resulting in multilevel moderate to severe neuroforaminal narrowing, most severe at  C3-4, C4-5, and C5-6. Upper chest: Changes of degenerative disc disease are noted at T2-3 and T3-4. Visualized lung apices demonstrate scarring within the left upper lobe. Moderate atherosclerotic calcification within the thoracic aorta. Other: None IMPRESSION: No acute intracranial injury. No calvarial fracture. Resolving frontal scalp hematoma. No acute fracture or listhesis of the cervical spine. 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 Electronically Signed   By: Fidela Salisbury MD   On: 12/10/2020 01:33    Procedures .Marland KitchenLaceration Repair  Date/Time: 12/10/2020 2:02 AM Performed by: Delora Fuel, MD Authorized by: Delora Fuel, MD   Consent:    Consent obtained:  Verbal   Consent given by:  Patient   Risks discussed:  Pain and infection   Alternatives discussed:  No treatment Universal protocol:    Procedure explained and questions answered to patient or proxy's satisfaction: yes     Relevant documents present and verified: yes     Test results available: yes     Imaging studies available: yes     Required blood products, implants, devices, and special equipment available: yes     Site/side marked: yes     Immediately prior to procedure, a time out was called: yes     Patient identity confirmed:  Verbally with patient and arm band Anesthesia:    Anesthesia method:  None Laceration details:    Location:  Scalp   Scalp location:  Occipital   Length (cm):  3   Depth (mm):  3 Pre-procedure details:    Preparation:  Patient was prepped and draped in usual sterile fashion and imaging obtained to evaluate for foreign bodies Exploration:    Limited defect created (wound extended): no     Hemostasis achieved with:  Direct pressure   Imaging obtained: x-ray     Imaging outcome: foreign body not noted     Wound exploration: entire depth of wound visualized     Wound extent: no foreign  bodies/material noted     Contaminated: no   Treatment:    Area cleansed with:  Saline   Amount of cleaning:  Standard   Debridement:  None   Undermining:  None   Scar revision: no   Skin repair:    Repair method:  Staples   Number of staples:  5 Approximation:    Approximation:  Close Repair type:    Repair type:  Simple Post-procedure details:    Dressing:  Open (no dressing)   Procedure completion:  Tolerated well, no immediate complications   Medications Ordered in ED Medications - No data to display  ED Course  I have reviewed the triage vital signs and the nursing notes.  Pertinent imaging results that were available during my care of the patient were reviewed by me and considered in my medical decision making (see chart for details).   MDM Rules/Calculators/A&P                         Fall with scalp laceration.  She is being sent for CT of head and cervical spine.  Old records are reviewed confirming several prior ED visits for falls.  CT shows no acute injury.  Wound was closed with staples and she is discharged home.  Staples are to be removed in 7 days.  Final Clinical Impression(s) / ED Diagnoses Final diagnoses:  Fall at home, initial encounter  Occipital scalp laceration, initial encounter    Rx / DC Orders ED Discharge Orders     None        Delora Fuel, MD [birth year] [phone]

## 2020-12-11 ENCOUNTER — Encounter: Payer: Self-pay | Admitting: Adult Health

## 2020-12-11 DIAGNOSIS — R296 Repeated falls: Secondary | ICD-10-CM | POA: Insufficient documentation

## 2020-12-11 NOTE — Progress Notes (Deleted)
Hospital follow up  Assessment and Plan: Hospital visit follow up for ***:   All medications were reviewed with patient and family and fully reconciled. All questions answered fully, and patient and family members were encouraged to call the office with any further questions or concerns. Discussed goal to avoid readmission related to this diagnosis.  There are no discontinued medications.  CAN NOT DO FOR BCBS REGULAR OR MEDICARE Over 40 minutes of exam, counseling, chart review, and complex, high/moderate level critical decision making was performed this visit.   Future Appointments  Date Time Provider [place]  12/15/2020 10:00 AM Liane Comber, NP [facility] None  03/30/2021  2:30 PM Liane Comber, NP [facility] None  09/17/2021 11:00 AM Unk [name], MD [facility] None     HPI 80 y.o.female presents for follow up for transition from recent hospitalization or SNIF stay. Admit date to the hospital was 12/10/20, patient was discharged from the hospital on 12/10/20 and our clinical staff contacted the office the day after discharge to set up a follow up appointment. The discharge summary, medications, and diagnostic test results were reviewed before meeting with the patient. The patient was admitted for:    ***  She has had 5 ED visits since 09/25/2020, weakeness, falls, etc  SNF?  She was seen ED 12/10/2020 due to fall at home with head injury/laceration. CT head/spine showed no acute injury, did show resolving frontal scalp hematoma, note previously noted trace subdural hematoma from 11/02/2020 was absent.   She has xanax 1 mg prescription ***  Neuropathy? Gabapentin ***   IMPRESSION: No acute intracranial injury. No calvarial fracture. Resolving frontal scalp hematoma.   No acute fracture or listhesis of the cervical spine.   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  Home health {ACTION; IS/IS ASN:[id number]} involved.    Her blood pressure {HAS HAS NOT:18834} been controlled at home, today their BP is    She {DOES_DOES ALP:37902} workout. She denies chest pain, shortness of breath, dizziness.  She {ACTION; IS/IS IOX:[id number]} on cholesterol medication and denies myalgias. Her cholesterol {ACTION; IS/IS NOT:[id number]} at goal. The cholesterol last visit was:   Lab Results  Component Value Date   CHOL 135 09/16/2020   HDL 50 09/16/2020   LDLCALC 66 09/16/2020   TRIG 102 09/16/2020   CHOLHDL 2.7 09/16/2020    She {Has/has not:18111} been working on diet and exercise for T2DM and denies {Symptoms; diabetes w/o none:19199}. Last A1C in the office was:  Lab Results  Component Value Date   HGBA1C 5.5 09/16/2020   Patient is on Vitamin D supplement.   Lab Results  Component Value Date   VD25OH 82 09/16/2020            Images while in the hospital: CT Head Wo Contrast  Result Date: 12/10/2020 CLINICAL DATA:  Unwitnessed fall, head injury, scalp laceration EXAM: CT HEAD WITHOUT CONTRAST CT CERVICAL SPINE WITHOUT CONTRAST TECHNIQUE: Multidetector CT imaging of the head and cervical spine was performed following the standard protocol without intravenous contrast. Multiplanar CT image reconstructions of the cervical spine were also generated. COMPARISON:  CT head 11/12/2020, CT cervical spine 11/02/2020 FINDINGS: CT HEAD FINDINGS Brain: Normal anatomic configuration. Parenchymal volume loss is commensurate with the patient's age. Mild periventricular white matter changes are present likely reflecting the sequela of small vessel ischemia. No abnormal intra or extra-axial mass lesion or fluid collection. No abnormal mass effect or midline shift. No evidence of  acute intracranial hemorrhage or infarct. Ventricular size is normal. Cerebellum unremarkable. Vascular: No asymmetric hyperdense vasculature at the skull base. Skull: Intact Sinuses/Orbits:  Paranasal sinuses are clear. Orbits are unremarkable. Other: Mastoid air cells and middle ear cavities are clear. Small frontal scalp hematoma appears improved since prior examination. CT CERVICAL SPINE FINDINGS Alignment: Reversal of the normal cervical lordosis is again identified. There is 3-4 mm anterolisthesis of C3 upon C4, C4 upon C5, and C5 upon C6, all stable since prior examination and likely degenerative in nature. Skull base and vertebrae: Craniocervical alignment is normal. The atlantodental interval is not widened. Degenerative changes are noted at the atlantodental articulation. No acute fracture of the cervical spine. Soft tissues and spinal canal: No prevertebral fluid or swelling. No visible canal hematoma. Disc levels: There is intervertebral disc space narrowing and endplate remodeling of a C3-C7, most severe at C5-C7 in keeping with changes of moderate to severe degenerative disc disease. Vertebral body height has been preserved. The prevertebral soft tissues are not thickened. Review of the axial images demonstrates multilevel uncovertebral and facet arthrosis resulting in multilevel moderate to severe neuroforaminal narrowing, most severe at C3-4, C4-5, and C5-6. Upper chest: Changes of degenerative disc disease are noted at T2-3 and T3-4. Visualized lung apices demonstrate scarring within the left upper lobe. Moderate atherosclerotic calcification within the thoracic aorta. Other: None IMPRESSION: No acute intracranial injury. No calvarial fracture. Resolving frontal scalp hematoma. No acute fracture or listhesis of the cervical spine. 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 Electronically Signed   By: Fidela Salisbury MD   On: 12/10/2020 01:33   CT Cervical Spine Wo Contrast  Result Date: 12/10/2020 CLINICAL DATA:  Unwitnessed fall, head injury, scalp laceration EXAM: CT HEAD WITHOUT  CONTRAST CT CERVICAL SPINE WITHOUT CONTRAST TECHNIQUE: Multidetector CT imaging of the head and cervical spine was performed following the standard protocol without intravenous contrast. Multiplanar CT image reconstructions of the cervical spine were also generated. COMPARISON:  CT head 11/12/2020, CT cervical spine 11/02/2020 FINDINGS: CT HEAD FINDINGS Brain: Normal anatomic configuration. Parenchymal volume loss is commensurate with the patient's age. Mild periventricular white matter changes are present likely reflecting the sequela of small vessel ischemia. No abnormal intra or extra-axial mass lesion or fluid collection. No abnormal mass effect or midline shift. No evidence of acute intracranial hemorrhage or infarct. Ventricular size is normal. Cerebellum unremarkable. Vascular: No asymmetric hyperdense vasculature at the skull base. Skull: Intact Sinuses/Orbits: Paranasal sinuses are clear. Orbits are unremarkable. Other: Mastoid air cells and middle ear cavities are clear. Small frontal scalp hematoma appears improved since prior examination. CT CERVICAL SPINE FINDINGS Alignment: Reversal of the normal cervical lordosis is again identified. There is 3-4 mm anterolisthesis of C3 upon C4, C4 upon C5, and C5 upon C6, all stable since prior examination and likely degenerative in nature. Skull base and vertebrae: Craniocervical alignment is normal. The atlantodental interval is not widened. Degenerative changes are noted at the atlantodental articulation. No acute fracture of the cervical spine. Soft tissues and spinal canal: No prevertebral fluid or swelling. No visible canal hematoma. Disc levels: There is intervertebral disc space narrowing and endplate remodeling of a C3-C7, most severe at C5-C7 in keeping with changes of moderate to severe degenerative disc disease. Vertebral body height has been preserved. The prevertebral soft tissues are not thickened. Review of the axial images demonstrates multilevel  uncovertebral and facet arthrosis resulting in multilevel moderate to severe neuroforaminal  narrowing, most severe at C3-4, C4-5, and C5-6. Upper chest: Changes of degenerative disc disease are noted at T2-3 and T3-4. Visualized lung apices demonstrate scarring within the left upper lobe. Moderate atherosclerotic calcification within the thoracic aorta. Other: None IMPRESSION: No acute intracranial injury. No calvarial fracture. Resolving frontal scalp hematoma. No acute fracture or listhesis of the cervical spine. 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 Electronically Signed   By: Fidela Salisbury MD   On: 12/10/2020 01:33     Current Outpatient Medications (Endocrine & Metabolic):    metFORMIN (GLUCOPHAGE-XR) 500 MG 24 hr tablet, TAKE 2 TABLETS BY MOUTH 2 X /DAY WITH MEALS FOR DIABETES (Patient taking differently: Take 500 mg by mouth 3 (three) times daily.)  Current Outpatient Medications (Cardiovascular):    atorvastatin (LIPITOR) 80 MG tablet, TAKE 1/3 TABLET BY MOUTH DAILY FOR CHOLESTEROL. DISCONTINUE PRAVASTATIN (Patient not taking: Reported on 11/13/2020)  Current Outpatient Medications (Respiratory):    fluticasone (FLONASE) 50 MCG/ACT nasal spray, Place 1-2 sprays into both nostrils daily as needed for allergies or rhinitis.   montelukast (SINGULAIR) 10 MG tablet, TAKE 1 TABLET BY MOUTH DAILY FOR ALLERGIES (Patient not taking: No sig reported)  Current Outpatient Medications (Analgesics):    acetaminophen (TYLENOL) 650 MG CR tablet, Take 650 mg by mouth See admin instructions. Take 650 mg by mouth at bedtime and an additional 650 mg during the day as needed for pain   Current Outpatient Medications (Other):    ALPRAZolam (XANAX) 1 MG tablet, TAKE 1/2 - 1 TABLET 2 - 3 X /DAY ONLY IF NEEDED FOR ANXIETY ATTACK & LIMIT TO 5 DAYS /WEEK TO AVOID ADDICTION & DEMENTIA (Patient taking differently:  Take 0.5-1 mg by mouth See admin instructions. Take 0.5-1 mg by mouth two to to three times a day "only if needed for an anxiety attack and limit to 5 days a week to avoid addiction and dementia")   Cholecalciferol (VITAMIN D-3 PO), Take 1-2 capsules by mouth daily.   esomeprazole (NEXIUM) 40 MG capsule, Take     1 capsule     Daily     to Prevent Heartburn & Indigestion (Patient not taking: No sig reported)   FLUoxetine (PROZAC) 40 MG capsule, TAKE 1 CAPSULE DAILY FOR MOOD (Patient taking differently: Take 40 mg by mouth daily in the afternoon.)   gabapentin (NEURONTIN) 300 MG capsule, Take  2 capsules  2 x /day  for Pain (Patient taking differently: Take 300 mg by mouth See admin instructions. Take 300 mg by mouth at bedtime and an additional 300 mg one to three times a day as needed for pain)   Multiple Vitamins-Minerals (ICAPS) CAPS, Take 1 capsule by mouth daily with lunch.   Multiple Vitamins-Minerals (ONE-A-DAY PROACTIVE 65+) TABS, Take 1 tablet by mouth daily with breakfast.   Propylene Glycol (SYSTANE BALANCE OP), Place 1 drop into both eyes 4 (four) times daily as needed (dry eyes).   solifenacin (VESICARE) 10 MG tablet, TAKE 1 TABLET BY MOUTH ONCE DAILY FOR BLADDER (Patient not taking: No sig reported)  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HCC)    Vitamin D deficiency      Allergies  Allergen Reactions   Effexor [Venlafaxine] Other (See  Comments)    Not effective   Nortriptyline Other (See Comments)    Not effective   Other Other (See Comments)    Spicy foods cause heartburn   Paxil [Paroxetine Hcl] Other (See Comments)    Not effective   Robaxin [Methocarbamol] Itching   Tizanidine Itching   Zoloft [Sertraline Hcl] Other (See Comments)    Not effective    ROS: all negative except above.    Physical Exam: There were no vitals filed for this visit. There were no vitals taken for this visit. General Appearance: Well nourished, in no apparent distress. Eyes: PERRLA, EOMs, conjunctiva no swelling or erythema Sinuses: No Frontal/maxillary tenderness ENT/Mouth: Ext aud canals clear, TMs without erythema, bulging. No erythema, swelling, or exudate on post pharynx.  Tonsils not swollen or erythematous. Hearing normal.  Neck: Supple, thyroid normal.  Respiratory: Respiratory effort normal, BS equal bilaterally without rales, rhonchi, wheezing or stridor.  Cardio: RRR with no MRGs. Brisk peripheral pulses without edema.  Abdomen: Soft, + BS.  Non tender, no guarding, rebound, hernias, masses. Lymphatics: Non tender without lymphadenopathy.  Musculoskeletal: Full ROM, 5/5 strength, normal gait.  Skin: Warm, dry without rashes, lesions, ecchymosis.  Neuro: Cranial nerves intact. Normal muscle tone, no cerebellar symptoms. Sensation intact.  Psych: Awake and oriented X 3, normal affect, Insight and Judgment appropriate.     Izora Ribas, NP 3:22 PM [facility]

## 2020-12-12 DIAGNOSIS — C8590 Non-Hodgkin lymphoma, unspecified, unspecified site: Secondary | ICD-10-CM | POA: Diagnosis not present

## 2020-12-12 DIAGNOSIS — Z602 Problems related to living alone: Secondary | ICD-10-CM | POA: Diagnosis not present

## 2020-12-12 DIAGNOSIS — E785 Hyperlipidemia, unspecified: Secondary | ICD-10-CM | POA: Diagnosis not present

## 2020-12-12 DIAGNOSIS — Z7984 Long term (current) use of oral hypoglycemic drugs: Secondary | ICD-10-CM | POA: Diagnosis not present

## 2020-12-12 DIAGNOSIS — E559 Vitamin D deficiency, unspecified: Secondary | ICD-10-CM | POA: Diagnosis not present

## 2020-12-12 DIAGNOSIS — G43909 Migraine, unspecified, not intractable, without status migrainosus: Secondary | ICD-10-CM | POA: Diagnosis not present

## 2020-12-12 DIAGNOSIS — M797 Fibromyalgia: Secondary | ICD-10-CM | POA: Diagnosis not present

## 2020-12-12 DIAGNOSIS — S065X0D Traumatic subdural hemorrhage without loss of consciousness, subsequent encounter: Secondary | ICD-10-CM | POA: Diagnosis not present

## 2020-12-12 DIAGNOSIS — K589 Irritable bowel syndrome without diarrhea: Secondary | ICD-10-CM | POA: Diagnosis not present

## 2020-12-12 DIAGNOSIS — E119 Type 2 diabetes mellitus without complications: Secondary | ICD-10-CM | POA: Diagnosis not present

## 2020-12-15 ENCOUNTER — Ambulatory Visit: Payer: PPO | Admitting: Adult Health

## 2020-12-16 ENCOUNTER — Ambulatory Visit: Payer: PPO | Admitting: Adult Health

## 2020-12-17 DIAGNOSIS — S0181XA Laceration without foreign body of other part of head, initial encounter: Secondary | ICD-10-CM | POA: Diagnosis not present

## 2020-12-21 ENCOUNTER — Ambulatory Visit (INDEPENDENT_AMBULATORY_CARE_PROVIDER_SITE_OTHER): Payer: PPO | Admitting: Internal Medicine

## 2020-12-21 ENCOUNTER — Other Ambulatory Visit: Payer: Self-pay

## 2020-12-21 ENCOUNTER — Encounter: Payer: Self-pay | Admitting: Internal Medicine

## 2020-12-21 VITALS — BP 131/72 | HR 86 | Temp 97.5°F | Resp 17 | Ht 64.0 in | Wt 142.4 lb

## 2020-12-21 DIAGNOSIS — S0101XS Laceration without foreign body of scalp, sequela: Secondary | ICD-10-CM

## 2020-12-21 NOTE — Progress Notes (Signed)
Future Appointments  Date Time Provider [place]  12/21/2020  1:45 PM Unk [name], MD [facility] None  12/24/2020  2:00 PM Magda Bernheim, NP [facility] None  03/30/2021  2:30 PM Liane Comber, NP [facility] None  09/17/2021 11:00 AM Unk [name], MD [facility] None    History of Present Illness:     Patient is a very nice 80 yo WWF   followed for HTN, HLD, T2_NIDDM and Vitamin D Deficiency and with hx/o and recent hx/o falls - had a Subdural Hematoma on May 23 and was discharged home 2 days later and after several ER visits for falls,  she was finally held til transferred to [facility] on 11/16/2020  Then on 6/30 a the NH, she slipped in the shower sustaining a laceration of her posterior scalp requiring stapling.  Head & Cx CT scans found no acute pathology.   She was discharged from NH back home on 12/12/2010. She does have a private health care worker with her during the day. She presents today for 7 day  f/u for staple removal   Medications    metFORMIN (GLUCOPHAGE-XR) 500 MG 24 hr tablet, TAKE 2 TABLETS BY MOUTH 2 X /DAY WITH MEALS FOR DIABETES (Patient taking differently: Take 500 mg by mouth 3 (three) times daily.)   fluticasone (FLONASE) 50 MCG/ACT nasal spray, Place 1-2 sprays into both nostrils daily as needed for allergies or rhinitis.   acetaminophen (TYLENOL) 650 MG CR tablet, Take 650 mg by mouth See admin instructions. Take 650 mg by mouth at bedtime and an additional 650 mg during the day as needed for pain   ALPRAZolam (XANAX) 1 MG tablet, TAKE 1/2 - 1 TABLET 2 - 3 X /DAY ONLY IF NEEDED FOR ANXIETY ATTACK & LIMIT TO 5 DAYS /WEEK TO AVOID ADDICTION & DEMENTIA (Patient taking differently: Take 0.5-1 mg by mouth See admin instructions. Take 0.5-1 mg by mouth two to to three times a day "only if needed for an anxiety attack and limit to 5 days a week to avoid addiction and dementia")   Cholecalciferol (VITAMIN D-3 PO), Take 1-2 capsules by mouth daily.    FLUoxetine (PROZAC) 40 MG capsule, TAKE 1 CAPSULE DAILY FOR MOOD (Patient taking differently: Take 40 mg by mouth daily in the afternoon.)   gabapentin (NEURONTIN) 300 MG capsule, Take  2 capsules  2 x /day  for Pain (Patient taking differently: Take 300 mg by mouth See admin instructions. Take 300 mg by mouth at bedtime and an additional 300 mg one to three times a day as needed for pain)   Multiple Vitamins-Minerals (ICAPS) CAPS, Take 1 capsule by mouth daily with lunch.   Multiple Vitamins-Minerals (ONE-A-DAY PROACTIVE 65+) TABS, Take 1 tablet by mouth daily with breakfast.   Propylene Glycol (SYSTANE BALANCE OP), Place 1 drop into both eyes 4 (four) times daily as needed (dry eyes).  Problem list She has Hyperlipidemia associated with type 2 diabetes mellitus ([place]); Essential hypertension; Gastroesophageal reflux disease; Vitamin D deficiency; IBS (irritable bowel syndrome); Type 2 diabetes mellitus with stage 2 chronic kidney disease, without long-term current use of insulin ([place]); Non Hodgkin's lymphoma ([place]); Medication management; Insomnia; CKD stage 2 due to type 2 diabetes mellitus ([place]); Degenerative disc disease, lumbar; Primary osteoarthritis of both knees; Fibromyalgia syndrome; Depression, major, recurrent, in partial remission ([place]); BMI 24.0-24.9, adult; OAB (overactive bladder); Osteopenia; Aortic atherosclerosis ([place]) by CXR in 2019; Nervous; Laceration of forehead; and Frequent falls on their problem list.  Observations/Objective:  BP 131/72   Pulse 86   Temp (!) 97.5 F (36.4 C)   Resp 17   Ht 5\' 4"  (1.626 m)   Wt 142 lb 6.4 oz (64.6 kg)   SpO2 96%   BMI 24.44 kg/m   Postural BP's Normal w/o orthostatic drop.   HEENT - WNL.    # 5 staples removed from wound of vertex scalp . Wound appears clean w/o signs of infection. Neck - supple.  Chest - Clear equal BS. Cor - Nl HS. RRR w/o sig MGR. PP 1(+). No edema. MS- FROM w/o deformities.  Gait supported by a rollator.   Neuro -  Nl w/o focal abnormalities.  Assessment and Plan:  1. Scalp laceration, sequela   Follow Up Instructions:        I discussed the assessment and treatment plan with the patient. The patient was provided an opportunity to ask questions and all were answered. The patient agreed with the plan and demonstrated an understanding of the instructions.       The patient was advised to call back or seek an in-person evaluation if the symptoms worsen or if the condition fails to improve as anticipated. Recommended f/u in  3 weeks.   Kirtland Bouchard, MD

## 2020-12-24 ENCOUNTER — Ambulatory Visit: Payer: PPO | Admitting: Nurse Practitioner

## 2020-12-28 DIAGNOSIS — E559 Vitamin D deficiency, unspecified: Secondary | ICD-10-CM | POA: Diagnosis not present

## 2020-12-28 DIAGNOSIS — C8590 Non-Hodgkin lymphoma, unspecified, unspecified site: Secondary | ICD-10-CM | POA: Diagnosis not present

## 2020-12-28 DIAGNOSIS — E785 Hyperlipidemia, unspecified: Secondary | ICD-10-CM | POA: Diagnosis not present

## 2020-12-28 DIAGNOSIS — M797 Fibromyalgia: Secondary | ICD-10-CM | POA: Diagnosis not present

## 2020-12-28 DIAGNOSIS — Z7984 Long term (current) use of oral hypoglycemic drugs: Secondary | ICD-10-CM | POA: Diagnosis not present

## 2020-12-28 DIAGNOSIS — G43909 Migraine, unspecified, not intractable, without status migrainosus: Secondary | ICD-10-CM | POA: Diagnosis not present

## 2020-12-28 DIAGNOSIS — Z602 Problems related to living alone: Secondary | ICD-10-CM | POA: Diagnosis not present

## 2020-12-28 DIAGNOSIS — S065X0D Traumatic subdural hemorrhage without loss of consciousness, subsequent encounter: Secondary | ICD-10-CM | POA: Diagnosis not present

## 2020-12-28 DIAGNOSIS — E119 Type 2 diabetes mellitus without complications: Secondary | ICD-10-CM | POA: Diagnosis not present

## 2020-12-28 DIAGNOSIS — K589 Irritable bowel syndrome without diarrhea: Secondary | ICD-10-CM | POA: Diagnosis not present

## 2021-01-11 ENCOUNTER — Telehealth: Payer: Self-pay | Admitting: Internal Medicine

## 2021-01-11 NOTE — Chronic Care Management (AMB) (Signed)
  Chronic Care Management   Note  01/11/2021 Name: Debra Short MRN: [id number] DOB: [birth year]  Debra Short is a 80 y.o. year old female who is a primary care patient of Unk [name], MD. I reached out to Achilles Dunk by phone today in response to a referral sent by Ms. Romie Levee PCP, Unk [name], MD.   [name] was given information about Chronic Care Management services today including:  CCM service includes personalized support from designated clinical staff supervised by her physician, including individualized plan of care and coordination with other care providers 24/7 contact phone numbers for assistance for urgent and routine care needs. Service will only be billed when office clinical staff spend 20 minutes or more in a month to coordinate care. Only one practitioner may furnish and bill the service in a calendar month. The patient may stop CCM services at any time (effective at the end of the month) by phone call to the office staff.   Patient agreed to services and verbal consent obtained.   Follow up plan:   Tatjana Secretary/administrator

## 2021-01-12 ENCOUNTER — Encounter: Payer: Self-pay | Admitting: Internal Medicine

## 2021-01-12 ENCOUNTER — Other Ambulatory Visit: Payer: Self-pay

## 2021-01-12 ENCOUNTER — Ambulatory Visit (INDEPENDENT_AMBULATORY_CARE_PROVIDER_SITE_OTHER): Payer: PPO | Admitting: Internal Medicine

## 2021-01-12 VITALS — BP 118/72 | HR 61 | Temp 97.8°F | Resp 16 | Ht 64.0 in | Wt 135.0 lb

## 2021-01-12 DIAGNOSIS — E559 Vitamin D deficiency, unspecified: Secondary | ICD-10-CM | POA: Diagnosis not present

## 2021-01-12 DIAGNOSIS — I10 Essential (primary) hypertension: Secondary | ICD-10-CM | POA: Diagnosis not present

## 2021-01-12 DIAGNOSIS — E1122 Type 2 diabetes mellitus with diabetic chronic kidney disease: Secondary | ICD-10-CM

## 2021-01-12 DIAGNOSIS — Z79899 Other long term (current) drug therapy: Secondary | ICD-10-CM

## 2021-01-12 DIAGNOSIS — E785 Hyperlipidemia, unspecified: Secondary | ICD-10-CM

## 2021-01-12 DIAGNOSIS — E1169 Type 2 diabetes mellitus with other specified complication: Secondary | ICD-10-CM

## 2021-01-12 DIAGNOSIS — N182 Chronic kidney disease, stage 2 (mild): Secondary | ICD-10-CM

## 2021-01-12 DIAGNOSIS — I700 Atherosclerosis of aorta: Secondary | ICD-10-CM | POA: Diagnosis not present

## 2021-01-12 NOTE — Progress Notes (Signed)
Future Appointments  Date Time Provider [place]  01/12/2021  4:00 PM Unk [name], MD [facility] None  01/19/2021  2:30 PM Rush [place]mark, [facility] None  03/30/2021  2:30 PM Liane Comber, NP [facility] None  09/17/2021 11:00 AM Unk [name], MD [facility] None    History of Present Illness:     Patient Debra Short a very nice 80 yo WWF returning for 3 week f/u after hospitalization after a fall w/ Subdural, then rehab stay at [facility]. Patient apparently doing well since return home and is independent in all ADL's.    Medications    metFORMIN (GLUCOPHAGE-XR) 500 MG 24 hr tablet, TAKE 2 TABLETS BY MOUTH 2 X /DAY WITH MEALS FOR DIABETES (Patient taking differently: Take 500 mg by mouth 3 (three) times daily.)   fluticasone (FLONASE) 50 MCG/ACT nasal spray, Place 1-2 sprays into both nostrils daily as needed for allergies or rhinitis.   acetaminophen (TYLENOL) 650 MG CR tablet, Take 650 mg by mouth See admin instructions. Take 650 mg by mouth at bedtime and an additional 650 mg during the day as needed for pain   ALPRAZolam (XANAX) 1 MG tablet, TAKE 1/2 - 1 TABLET 2 - 3 X /DAY ONLY IF NEEDED FOR ANXIETY ATTACK & LIMIT TO 5 DAYS /WEEK TO AVOID ADDICTION & DEMENTIA (Patient taking differently: Take 0.5-1 mg by mouth See admin instructions. Take 0.5-1 mg by mouth two to to three times a day "only if needed for an anxiety attack and limit to 5 days a week to avoid addiction and dementia")   Cholecalciferol (VITAMIN D-3 PO), Take 1-2 capsules by mouth daily.   FLUoxetine (PROZAC) 40 MG capsule, TAKE 1 CAPSULE DAILY FOR MOOD (Patient taking differently: Take 40 mg by mouth daily in the afternoon.)   gabapentin (NEURONTIN) 300 MG capsule, Take  2 capsules  2 x /day  for Pain (Patient taking differently: Take 300 mg by mouth See admin instructions. Take 300 mg by mouth at bedtime and an additional 300 mg one to three times a day as needed for pain)   Multiple Vitamins-Minerals  (ICAPS) CAPS, Take 1 capsule by mouth daily with lunch.   Multiple Vitamins-Minerals (ONE-A-DAY PROACTIVE 65+) TABS, Take 1 tablet by mouth daily with breakfast.   Propylene Glycol (SYSTANE BALANCE OP), Place 1 drop into both eyes 4 (four) times daily as needed (dry eyes).  Problem list She has Hyperlipidemia associated with type 2 diabetes mellitus ([place]); Essential hypertension; Gastroesophageal reflux disease; Vitamin D deficiency; IBS (irritable bowel syndrome); Type 2 diabetes mellitus with stage 2 chronic kidney disease, without long-term current use of insulin ([place]); Non Hodgkin's lymphoma ([place]); Medication management; Insomnia; CKD stage 2 due to type 2 diabetes mellitus ([place]); Degenerative disc disease, lumbar; Primary osteoarthritis of both knees; Fibromyalgia syndrome; Depression, major, recurrent, in partial remission ([place]); BMI 24.0-24.9, adult; OAB (overactive bladder); Osteopenia; Aortic atherosclerosis ([place]) by CXR in 2019; Nervous; Laceration of forehead; and Frequent falls on their problem list.   Observations/Objective:  BP 118/72   Pulse 61   Temp 97.8 F (36.6 C)   Resp 16   Ht '5\' 4"'$  (1.626 m)   Wt 135 lb (61.2 kg)   SpO2 98%   BMI 23.17 kg/m   HEENT - WNL. Neck - supple.  Chest - Clear equal BS. Cor - Nl HS. RRR w/o sig MGR  Follow Up Instructions:  1. Essential hypertension  - CBC with Differential/Platelet - COMPLETE METABOLIC PANEL WITH GFR - Magnesium - TSH  2. Hyperlipidemia associated with type 2 diabetes mellitus ([place])  - Lipid panel - TSH  3. Type 2 diabetes mellitus with stage 2 chronic kidney  disease, without long-term current use of insulin (HCC)  - Hemoglobin A1c - Insulin, random  4. Vitamin D deficiency  - VITAMIN D 25 Hydroxy   5. Aortic atherosclerosis ([place]) by CXR in 2019  - Lipid panel  6. Medication management  - CBC with Differential/Platelet - COMPLETE METABOLIC PANEL WITH GFR - Magnesium - Lipid panel - TSH -  Hemoglobin A1c - Insulin, random - VITAMIN D 25 Hydroxy        I discussed the assessment and treatment plan with the patient. The patient was provided an opportunity to ask questions and all were answered. The patient agreed with the plan and demonstrated an understanding of the instructions.       The patient was advised to call back or seek an in-person evaluation if the symptoms worsen or if the condition fails to improve as anticipated.    Kirtland Bouchard, MD

## 2021-01-12 NOTE — Patient Instructions (Signed)

## 2021-01-13 ENCOUNTER — Other Ambulatory Visit: Payer: Self-pay | Admitting: Internal Medicine

## 2021-01-13 DIAGNOSIS — D500 Iron deficiency anemia secondary to blood loss (chronic): Secondary | ICD-10-CM

## 2021-01-13 LAB — CBC WITH DIFFERENTIAL/PLATELET
Absolute Monocytes: 628 cells/uL (ref 200–950)
Basophils Absolute: 18 cells/uL (ref 0–200)
Basophils Relative: 0.3 %
Eosinophils Absolute: 177 cells/uL (ref 15–500)
Eosinophils Relative: 2.9 %
HCT: 32.7 % — ABNORMAL LOW (ref 35.0–45.0)
Hemoglobin: 10.7 g/dL — ABNORMAL LOW (ref 11.7–15.5)
Lymphs Abs: 1562 cells/uL (ref 850–3900)
MCH: 30.7 pg (ref 27.0–33.0)
MCHC: 32.7 g/dL (ref 32.0–36.0)
MCV: 94 fL (ref 80.0–100.0)
MPV: 9.4 fL (ref 7.5–12.5)
Monocytes Relative: 10.3 %
Neutro Abs: 3715 cells/uL (ref 1500–7800)
Neutrophils Relative %: 60.9 %
Platelets: 278 10*3/uL (ref 140–400)
RBC: 3.48 10*6/uL — ABNORMAL LOW (ref 3.80–5.10)
RDW: 13.1 % (ref 11.0–15.0)
Total Lymphocyte: 25.6 %
WBC: 6.1 10*3/uL (ref 3.8–10.8)

## 2021-01-13 LAB — HEMOGLOBIN A1C
Hgb A1c MFr Bld: 5.1 % of total Hgb (ref <5.7)
Mean Plasma Glucose: 100 mg/dL
eAG (mmol/L): 5.5 mmol/L

## 2021-01-13 LAB — COMPLETE METABOLIC PANEL WITH GFR
AG Ratio: 1.9 (calc) (ref 1.0–2.5)
ALT: 10 U/L (ref 6–29)
AST: 15 U/L (ref 10–35)
Albumin: 3.8 g/dL (ref 3.6–5.1)
Alkaline phosphatase (APISO): 51 U/L (ref 37–153)
BUN: 18 mg/dL (ref 7–25)
CO2: 30 mmol/L (ref 20–32)
Calcium: 8.9 mg/dL (ref 8.6–10.4)
Chloride: 105 mmol/L (ref 98–110)
Creat: 0.74 mg/dL (ref 0.60–0.95)
Globulin: 2 g/dL (calc) (ref 1.9–3.7)
Glucose, Bld: 142 mg/dL — ABNORMAL HIGH (ref 65–99)
Potassium: 4.1 mmol/L (ref 3.5–5.3)
Sodium: 142 mmol/L (ref 135–146)
Total Bilirubin: 0.5 mg/dL (ref 0.2–1.2)
Total Protein: 5.8 g/dL — ABNORMAL LOW (ref 6.1–8.1)
eGFR: 81 mL/min/{1.73_m2} (ref >=60)

## 2021-01-13 LAB — LIPID PANEL
Cholesterol: 110 mg/dL (ref <200)
HDL: 46 mg/dL — ABNORMAL LOW (ref >=50)
LDL Cholesterol (Calc): 39 mg/dL (calc)
Non-HDL Cholesterol (Calc): 64 mg/dL (calc) (ref <130)
Total CHOL/HDL Ratio: 2.4 (calc) (ref <5.0)
Triglycerides: 175 mg/dL — ABNORMAL HIGH (ref <150)

## 2021-01-13 LAB — INSULIN, RANDOM: Insulin: 74.2 u[IU]/mL — ABNORMAL HIGH

## 2021-01-13 LAB — MAGNESIUM: Magnesium: 1.8 mg/dL (ref 1.5–2.5)

## 2021-01-13 LAB — VITAMIN D 25 HYDROXY (VIT D DEFICIENCY, FRACTURES): Vit D, 25-Hydroxy: 84 ng/mL (ref 30–100)

## 2021-01-13 LAB — TSH: TSH: 1.93 mIU/L (ref 0.40–4.50)

## 2021-01-13 NOTE — Progress Notes (Signed)
============================================================ ============================================================  -    CBC shows Red Cell Count / Hgd has dropped from 1.0 gm%  to                                                                     now 10.7 gm % over the last 2 months.   - So need to return in 1 week for Nurse visit to recheck CBC  ============================================================ ============================================================  -  Magnesium  -  1.8   -  very  low   - goal is betw 2.0 - 2.5,   - So..............Marland Kitchen  Recommend that you take  Magnesium 500 mg tablet daily   - also important to eat lots of  leafy green vegetables   - spinach - Kale - collards - greens - okra - asparagus  - broccoli - quinoa - squash - almonds   - black, red, white beans  -  peas - green beans ============================================================ ============================================================  - Chol = 110 =-  Excellent   - Very low risk for Heart Attack  / Stroke ============================================================ ============================================================  -  A1c = Normal - Great - No Diabetes  ============================================================ ============================================================  - Vitamin D = 84 - Excellent   ! ============================================================ ============================================================  - All Else - Kidneys - Electrolytes - Liver & Thyroid    - all  Normal / OK ============================================================ ============================================================

## 2021-01-15 ENCOUNTER — Telehealth: Payer: Self-pay

## 2021-01-15 NOTE — Progress Notes (Signed)
      Name: Debra Short  MRN: [id number] DOB: 1940/10/26   Reason for Encounter: CPP chart prep for 01/19/21   Conditions to be addressed/monitored: HTN, HLD, DMII, CKD Stage 2, Depression, and Vitamin D deficiency, Osteoarthritis, GERD, Irritable bowel syndrome, Insomnia, Fibromyalgia .   Recent office visits:  01/12/2021-Dr. Melford Aase- Patient seen for 3 week f/u after hospitalization after a fall w/ Subdural , then rehab stay at [facility]. BP 118/72, Pulse 61. No medication changes noted. 12/21/2020- Dr. Melford Aase- Patient seen for staple removal of scalp laceration. BP 131/72, Pulse 86. Stopped Atorvastatin '80mg'$ , Esomeprazole '40mg'$ , Montelukast '10mg'$ , and Solifenacin '10mg'$ .   Recent consult visits:  None  Hospital visits:  Medication Reconciliation was completed by comparing discharge summary, patient's EMR and Pharmacy list, and upon discussion with patient.  Admitted to the hospital on 12/10/2020 due to Fall and laceration. Discharge date was 12/10/2020. Discharged from [facility].    No medication changes noted  Admitted to the hospital on 11/12/2020 due to non-intractable vomiting with nausea. Discharge date was 11/16/2020. Discharged from [facility].    No medication changes noted  Admitted to the hospital on 11/10/2020 due to weakness. Discharge date was 11/11/2020. Discharged from [facility].    No medication changes noted -All other medications will remain the same.    Medications: Outpatient Encounter Medications as of 01/15/2021  Medication Sig   acetaminophen (TYLENOL) 650 MG CR tablet Take 650 mg by mouth See admin instructions. Take 650 mg by mouth at bedtime and an additional 650 mg during the day as needed for pain   ALPRAZolam (XANAX) 1 MG tablet TAKE 1/2 - 1 TABLET 2 - 3 X /DAY ONLY IF NEEDED FOR ANXIETY ATTACK & LIMIT TO 5 DAYS /WEEK TO AVOID ADDICTION & DEMENTIA (Patient taking differently: Take 0.5-1 mg by mouth See admin  instructions. Take 0.5-1 mg by mouth two to to three times a day "only if needed for an anxiety attack and limit to 5 days a week to avoid addiction and dementia")   Cholecalciferol (VITAMIN D-3 PO) Take 1-2 capsules by mouth daily.   FLUoxetine (PROZAC) 40 MG capsule TAKE 1 CAPSULE DAILY FOR MOOD (Patient taking differently: Take 40 mg by mouth daily in the afternoon.)   fluticasone (FLONASE) 50 MCG/ACT nasal spray Place 1-2 sprays into both nostrils daily as needed for allergies or rhinitis.   gabapentin (NEURONTIN) 300 MG capsule Take  2 capsules  2 x /day  for Pain (Patient taking differently: Take 300 mg by mouth See admin instructions. Take 300 mg by mouth at bedtime and an additional 300 mg one to three times a day as needed for pain)   metFORMIN (GLUCOPHAGE-XR) 500 MG 24 hr tablet TAKE 2 TABLETS BY MOUTH 2 X /DAY WITH MEALS FOR DIABETES (Patient taking differently: Take 500 mg by mouth 3 (three) times daily.)   Multiple Vitamins-Minerals (ICAPS) CAPS Take 1 capsule by mouth daily with lunch.   Multiple Vitamins-Minerals (ONE-A-DAY PROACTIVE 65+) TABS Take 1 tablet by mouth daily with breakfast.   Propylene Glycol (SYSTANE BALANCE OP) Place 1 drop into both eyes 4 (four) times daily as needed (dry eyes).   No facility-administered encounter medications on file as of 01/15/2021.    Care Gaps: Influenza, Shingles and Covid vaccines, Foot exam.  Star Rating Drugs: None  Called patient to confirm CPP visit for 01/19/21. Left detailed voicemail about 01/19/21 visit.   [place], Health Concierge [phone]

## 2021-01-17 ENCOUNTER — Encounter: Payer: Self-pay | Admitting: Internal Medicine

## 2021-01-19 ENCOUNTER — Ambulatory Visit (INDEPENDENT_AMBULATORY_CARE_PROVIDER_SITE_OTHER): Payer: PPO | Admitting: Pharmacist

## 2021-01-19 ENCOUNTER — Other Ambulatory Visit: Payer: Self-pay

## 2021-01-19 DIAGNOSIS — E1169 Type 2 diabetes mellitus with other specified complication: Secondary | ICD-10-CM | POA: Diagnosis not present

## 2021-01-19 DIAGNOSIS — I10 Essential (primary) hypertension: Secondary | ICD-10-CM

## 2021-01-19 DIAGNOSIS — E785 Hyperlipidemia, unspecified: Secondary | ICD-10-CM

## 2021-01-19 NOTE — Progress Notes (Signed)
Chronic Care Management Pharmacy Note  02/09/2021 Name:  Debra Short MRN:  [id number] DOB:  [birth year]  Summary: Patient is doing well. Reports regaining her strength but taking precautions to not drive at this time and avoiding falls.  Recommendations/Changes made from today's visit: Patient may benefit from Duloxetine therapy (replacing fluoxetine) in the future. Patient does not wish to make medication changes at this time.   Subjective: Debra Short is an 80 y.o. year old female who is a primary patient of Unk [name], MD.  The CCM team was consulted for assistance with disease management and care coordination needs.    Engaged with patient by telephone for initial visit in response to provider referral for pharmacy case management and/or care coordination services.   Consent to Services:  The patient was given information about Chronic Care Management services, agreed to services, and gave verbal consent prior to initiation of services.  Please see initial visit note for detailed documentation.   Patient Care Team: Unk [name], MD as PCP - General (Internal Medicine) Bo Merino, MD as Consulting Physician (Rheumatology) [name], MD (Inactive) as Consulting Physician (Gastroenterology) Allyn Kenner, MD (Dermatology) Rush Landmark, [facility] as Pharmacist (Pharmacist)  Recent office visits: 01/12/2021-Dr. Melford Aase- Patient seen for 3 week f/u after hospitalization after a fall w/ Subdural , then rehab stay at [facility]. BP 118/72, Pulse 61. No medication changes noted. 12/21/2020- Dr. Melford Aase- Patient seen for staple removal of scalp laceration. BP 131/72, Pulse 86. Stopped Atorvastatin 35m, Esomeprazole 446m Montelukast 1069mand Solifenacin 64m56m  Recent consult visits: None  Hospital visits: Admitted to the hospital on 12/10/2020 due to Fall and laceration. Discharge date was 12/10/2020. [facility]  No medication  changes noted   Admitted to the hospital on 11/12/2020 due to non-intractable vomiting with nausea. Discharge date was 11/16/2020. [facility]  No medication changes noted   Admitted to the hospital on 11/10/2020 due to weakness. Discharge date was 11/11/2020. [facility]  No medication changes noted -All other medications will remain the same.     Objective:  Lab Results  Component Value Date   CREATININE 0.74 01/12/2021   BUN 18 01/12/2021   GFRNONAA >60 11/12/2020   GFRAA 84 09/16/2020   NA 142 01/12/2021   K 4.1 01/12/2021   CALCIUM 8.9 01/12/2021   CO2 30 01/12/2021   GLUCOSE 142 (H) 01/12/2021    Lab Results  Component Value Date/Time   HGBA1C 5.1 01/12/2021 04:43 PM   HGBA1C 5.5 09/16/2020 03:44 PM   MICROALBUR 2.4 09/16/2020 03:44 PM   MICROALBUR 1.3 09/10/2019 01:49 PM    Last diabetic Eye exam:  Lab Results  Component Value Date/Time   HMDIABEYEEXA No Retinopathy 08/11/2020 12:00 AM    Last diabetic Foot exam: No results found for: HMDIABFOOTEX   Lab Results  Component Value Date   CHOL 110 01/12/2021   HDL 46 (L) 01/12/2021   LDLCALC 39 01/12/2021   TRIG 175 (H) 01/12/2021   CHOLHDL 2.4 01/12/2021    Hepatic Function Latest Ref Rng & Units 01/12/2021 11/12/2020 11/10/2020  Total Protein 6.1 - 8.1 g/dL 5.8(L) 8.0 7.9  Albumin 3.5 - 5.0 g/dL - 4.3 4.2  AST 10 - 35 U/L 15 34 20  ALT 6 - 29 U/L _0 Alk Phosphatase 38 - 126 U/L - 61 62  Total Bilirubin 0.2 - 1.2 mg/dL  0.5 2.4(H) 2.1(H)  Bilirubin, Direct 0.0 - 0.2 mg/dL - - -    Lab Results  Component Value Date/Time   TSH 1.93 01/12/2021 04:43 PM   TSH 1.94 09/16/2020 03:44 PM    CBC Latest Ref Rng & Units 01/12/2021 11/12/2020 11/10/2020  WBC 3.8 - 10.8 Thousand/uL 6.1 9.3 9.3  Hemoglobin 11.7 - 15.5 g/dL 10.7(L) 14.0 14.0  Hematocrit 35.0 - 45.0 % 32.7(L) 42.4 42.0  Platelets 140 - 400 Thousand/uL 278 386 375    Lab Results   Component Value Date/Time   VD25OH 84 01/12/2021 04:43 PM   VD25OH 82 09/16/2020 03:44 PM    Clinical ASCVD: Yes  The ASCVD Risk score Mikey Bussing DC Jr., et al., 2013) failed to calculate for the following reasons:   The 2013 ASCVD risk score is only valid for ages 46 to 52    Depression screen PHQ 2/9 01/17/2021 09/15/2020 12/11/2019  Decreased Interest 0 0 0  Down, Depressed, Hopeless 0 0 0  PHQ - 2 Score 0 0 0  Altered sleeping - - 0  Tired, decreased energy - - 0  Change in appetite - - 0  Feeling bad or failure about yourself  - - 0  Trouble concentrating - - 0  Moving slowly or fidgety/restless - - 0  Suicidal thoughts - - 0  PHQ-9 Score - - 0  Difficult doing work/chores - - Not difficult at all     See below as applicable Other: ([id number] if Afib, MMRC or CAT for COPD, ACT, DEXA)  Social History   Tobacco Use  Smoking Status Never  Smokeless Tobacco Never   BP Readings from Last 3 Encounters:  01/12/21 118/72  12/21/20 131/72  12/10/20 (!) 118/53   Pulse Readings from Last 3 Encounters:  01/12/21 61  12/21/20 86  12/10/20 80   Wt Readings from Last 3 Encounters:  01/12/21 135 lb (61.2 kg)  12/21/20 142 lb 6.4 oz (64.6 kg)  12/10/20 160 lb 0.9 oz (72.6 kg)   BMI Readings from Last 3 Encounters:  01/12/21 23.17 kg/m  12/21/20 24.44 kg/m  12/10/20 25.83 kg/m    Assessment/Interventions: Review of patient past medical history, allergies, medications, health status, including review of consultants reports, laboratory and other test data, was performed as part of comprehensive evaluation and provision of chronic care management services.   SDOH:  (Social Determinants of Health) assessments and interventions performed: Yes SDOH Interventions    Flowsheet Row Most Recent Value  SDOH Interventions   Housing Interventions Intervention Not Indicated  Transportation Interventions Intervention Not Indicated      SDOH Screenings   Alcohol Screen: Not on file   Depression (PHQ2-9): Low Risk    PHQ-2 Score: 0  Financial Resource Strain: Not on file  Food Insecurity: Not on file  Housing: [place] Risk Score: 0  Physical Activity: Not on file  Social Connections: Not on file  Stress: Not on file  Tobacco Use: Low Risk    Smoking Tobacco Use: Never   Smokeless Tobacco Use: Never  Transportation Needs: No Transportation Needs   Lack of Transportation (Medical): No   Lack of Transportation (Non-Medical): No    CCM Care Plan  Allergies  Allergen Reactions   Effexor [Venlafaxine] Other (See Comments)    Not effective   Nortriptyline Other (See Comments)    Not effective   Other Other (See Comments)    Spicy foods cause heartburn   Paxil [Paroxetine Hcl] Other (  See Comments)    Not effective   Robaxin [Methocarbamol] Itching   Tizanidine Itching   Zoloft [Sertraline Hcl] Other (See Comments)    Not effective    Medications Reviewed Today     Reviewed by Rush Landmark, [facility] (Pharmacist) on 02/09/21 at [place] List Status: <None>   Medication Order Taking? Sig Documenting Provider Last Dose Status Informant  acetaminophen (TYLENOL) 650 MG CR tablet [id number] Yes Take 650 mg by mouth See admin instructions. Take 650 mg by mouth at bedtime and an additional 650 mg during the day as needed for pain [provider] Taking Active Self  ALPRAZolam (XANAX) 1 MG tablet [id number] Yes TAKE 1/2 - 1 TABLET 2 - 3 X /DAY ONLY IF NEEDED FOR ANXIETY ATTACK & LIMIT TO 5 DAYS /WEEK TO AVOID ADDICTION & DEMENTIA  Patient taking differently: Take 0.5-1 mg by mouth See admin instructions. Take 0.5-1 mg by mouth two to to three times a day "only if needed for an anxiety attack and limit to 5 days a week to avoid addiction and dementia"   Liane Comber, NP Taking Active Self  Cholecalciferol (VITAMIN D-3 PO) [id number] Yes Take 1-2 capsules by mouth daily. 2000 iu per day [provider] Taking Active Self  esomeprazole  (NEXIUM) 40 MG capsule [id number] Yes Take 40 mg by mouth daily. [provider] Taking Active   FLUoxetine (PROZAC) 40 MG capsule [id number] Yes TAKE 1 CAPSULE DAILY FOR MOOD  Patient taking differently: Take 40 mg by mouth daily in the afternoon.   Liane Comber, NP Taking Active   fluticasone (FLONASE) 50 MCG/ACT nasal spray [id number] Yes Place 1-2 sprays into both nostrils daily as needed for allergies or rhinitis. Liane Comber, NP Taking Active Self  gabapentin (NEURONTIN) 300 MG capsule [id number] Yes Take  2 capsules  2 x /day  for Pain  Patient taking differently: Take 300 mg by mouth See admin instructions. Patient taking 2 towards bedtime   Unk [name], MD Taking Active   metFORMIN (GLUCOPHAGE-XR) 500 MG 24 hr tablet [id number] Yes TAKE 2 TABLETS BY MOUTH 2 X /DAY WITH MEALS FOR DIABETES Liane Comber, NP Taking Active   Multiple Vitamins-Minerals (ICAPS) CAPS [id number] Yes Take 1 capsule by mouth daily with lunch. [provider] Taking Active Self  Multiple Vitamins-Minerals (ONE-A-DAY PROACTIVE 65+) TABS [id number] Yes Take 1 tablet by mouth daily with breakfast. [provider] Taking Active Self  Propylene Glycol (SYSTANE BALANCE OP) [id number] Yes Place 1 drop into both eyes 4 (four) times daily as needed (dry eyes). [provider] Taking Active Self  sodium chloride (MURO 128) 5 % ophthalmic solution [id number] Yes Place 1 drop into both eyes in the morning, at noon, and at bedtime. [provider] Taking Active             Patient Active Problem List   Diagnosis Date Noted   Frequent falls 12/11/2020   Laceration of forehead 11/03/2020   Nervous 02/29/2020   Aortic atherosclerosis ([place]) by CXR in 2019 12/11/2019   Osteopenia 11/26/2018   OAB (overactive bladder) 05/14/2018   BMI 24.0-24.9, adult 05/09/2018   Depression, major, recurrent, in partial remission ([place]) 04/25/2017   Degenerative disc disease, lumbar  07/20/2016   Primary osteoarthritis of both knees 07/20/2016   Fibromyalgia syndrome 07/20/2016   CKD stage 2 due to type 2 diabetes mellitus ([place]) 10/04/2015   Insomnia 10/06/2014   Medication management 10/09/2013   Hyperlipidemia associated with type 2 diabetes mellitus ([place])  Essential hypertension    Gastroesophageal reflux disease    Vitamin D deficiency    IBS (irritable bowel syndrome)    Type 2 diabetes mellitus with stage 2 chronic kidney disease, without long-term current use of insulin (HCC)    Non Hodgkin's lymphoma ([place])     Immunization History  Administered Date(s) Administered   Influenza Split 04/10/2013, 03/23/2016   Influenza, High Dose Seasonal PF 03/24/2014, 02/27/2015   Influenza-Unspecified 03/27/2018   Moderna Sars-Covid-2 Vaccination 07/26/2019, 08/23/2019, 04/21/2020   Pneumococcal Conjugate-13 03/24/2014   Pneumococcal Polysaccharide-23 06/13/2008   Td 09/24/2012   Tdap 09/26/2014, 09/25/2020, 11/02/2020   Zoster, Live 02/23/2011    Conditions to be addressed/monitored:  Hypertension, Hyperlipidemia, Diabetes, GERD, Chronic Kidney Disease, Depression, Anxiety, Osteoarthritis, Overactive Bladder, and DDD, fibromyalgia, Osteopenia  Care Plan : General Pharmacy (Adult)  Updates made by Rush Landmark, [place] since 02/09/2021 12:00 AM     Problem: Hypertension, Hyperlipidemia, Diabetes, GERD, Chronic Kidney Disease, Depression, Anxiety, Osteoarthritis, Overactive Bladder, and DDD, fibromyalgia, Osteopenia      Long-Range Goal: Patient-Specific Goal   Start Date: 01/19/2021  Expected End Date: 07/22/2021  Priority: High  Note:     Current Barriers:  Returned home after rehab, working on regaining strength to do every day tasks. Has assistance  Pharmacist Clinical Goal(s):  Patient will verbalize ability to afford treatment regimen achieve adherence to monitoring guidelines and medication adherence to achieve therapeutic efficacy through collaboration  with PharmD and provider.   Interventions: 1:1 collaboration with Unk [name], MD regarding development and update of comprehensive plan of care as evidenced by provider attestation and co-signature Inter-disciplinary care team collaboration (see longitudinal plan of care) Comprehensive medication review performed; medication list updated in electronic medical record  Hypertension  (Status:New goal. Goal Met.)   Med Management Intervention: None  (BP goal <130/80) -Controlled -Current treatment: None -Medications previously tried: N/A  -Current home readings: nurses checking for patient, patient checking occasionally, all within range  -Current dietary habits: significant weight loss last 8 months Patient eating healthier (fruits and vegetables, reduced sugar) -Current exercise habits: Regular exercise regimen -Denies hypotensive/hypertensive symptoms -Educated on BP goals and benefits of medications for prevention of heart attack, stroke and kidney damage; Daily salt intake goal < 2300 mg; Importance of home blood pressure monitoring; Proper BP monitoring technique; -Counseled to monitor BP at home 2x/ week, document, and provide log at future appointments -Counseled on diet and exercise extensively Recommended to continue current medication  Hyperlipidemia: (LDL goal < 70) -Controlled -Current treatment: None -Medications previously tried: N/A  -Current dietary patterns: See above -Current exercise habits: See above -Educated on Benefits of statin for ASCVD risk reduction; Importance of limiting foods high in cholesterol; Exercise goal of 150 minutes per week; -Counseled on diet and exercise extensively Recommended to continue current medication Assessed statin therapy benefits, TG elevated, LDL at goal, patient to continue lifestyle modifications  Diabetes (A1c goal <7%) -Controlled -Current medications: Metformin 2 am 2 pm (appropriate, effective, safe,  accessible) A1C: 5.1 01/12/2021 Diabetic eye exam: 08/11/2020 -Medications previously tried: N/A  -Current home glucose readings Ordered new BG device -Denies hypoglycemic/hyperglycemic symptoms -Current meal patterns:  Weight loss due to eliminating sugar in diet -Current exercise: regular exercise regimen -Educated on A1c and blood sugar goals; Complications of diabetes including kidney damage, retinal damage, and cardiovascular disease; Exercise goal of 150 minutes per week; Benefits of weight loss; Benefits of routine self-monitoring of blood sugar; Carbohydrate counting and/or plate method -Counseled to check feet daily and  get yearly eye exams -Counseled on diet and exercise extensively Recommended to continue current medication  Depression/Anxiety -Controlled -Current treatment: Alprazolam 58m; 1/2 to 1 tablet two to three times a day as needed (appropriate, effective, safe, accessible) Fluoxetine 40 mg daily (appropriate, effective, safe, accessible) -Medications previously tried/failed: Effexor, nortriptyline, paxil, zoloft -PHQ9: 1; 01/19/2021 -GAD7: 0; 01/19/2021 -Educated on Benefits of medication for symptom control Benefits of cognitive-behavioral therapy with or without medication -Recommended to continue current medication  Osteopenia   TSH 1.93 01/12/2021 04:43 PM  VD25OH 84 01/12/2021 04:43 PM  CALCIUM 8.9 01/12/2021 Unable to locate results on file for 11/08/2017 scan -Unable to assess -Current treatment  Vitamin D 2000 IU daily (appropriate, effective, safe, accessible) -Medications previously tried: N/A  Patient is prone to falls, extensively discussed fall prevention -Recommend [phone] units of vitamin D daily. Recommend 1200 mg of calcium daily from dietary and supplemental sources. Recommend weight-bearing and muscle strengthening exercises for building and maintaining bone density. -Counseled on diet and exercise extensively Recommended to continue current  medication  CKD   GFRNONAA >60 11/12/2020 -Controlled -Current treatment  None -Medications previously tried: N/A  -Counseled on importance of BG and BP control, avoiding NSAIDs  GERD -Controlled -Current treatment  Esomeprazole 40 mg daily (appropriate, effective, safe, accessible) -Medications previously tried: N/A  Patient states being on PPI for years, not a candidate for step down therapy at this time -Counseled on diet and exercise extensively  OA/ DDD/ Fibro/ Neuropathy -Controlled -Current treatment  Tylenol PRN (appropriate, effective, safe, accessible) Gabapentin 300 mg 2 caps BID (appropriate, effective, safe, accessible) -Medications previously tried: N/A Patient states tylenol provides adequate pain management at this time. Discussed switching fluoxetine to duloxetine for anxiety, depression and pain control but patient does not wish to make this change at this time  -Counseled on diet and exercise extensively Recommended to continue current medication  OAB -Controlled -Current treatment  None -Medications previously tried: N/A  Patient does not wish to start treatment at this time. Discussed avoiding liquids towards bedtime and timed voiding.   Health Maintenance -Vaccine gaps: Discussed with patient -Current therapy:  Vitamin D -Educated on Cost vs benefit of each product must be carefully weighed by individual consumer -Patient is satisfied with current therapy and denies issues -Counseled on diet and exercise extensively  Patient Goals/Self-Care Activities Patient will:  - take medications as prescribed  Follow Up Plan: Telephone follow up appointment with care management team member scheduled for: 11/22        Medication Assistance: None required.  Patient affirms current coverage meets needs.  Compliance/Adherence/Medication fill history: Care Gaps: Influenza, Shingles and Covid vaccines, Foot exam.  Star-Rating  Drugs: Metformin  Patient's preferred pharmacy is:  [id number] [place], [place] - [address] [id number]: [phone]: [phone] pill box? Yes Pt endorses 100% compliance  We discussed: Benefits of medication synchronization, packaging and delivery as well as enhanced pharmacist oversight with [employer]. Patient decided to: Continue current medication management strategy  Care Plan and Follow Up Patient Decision:  Patient agrees to Care Plan and Follow-up.  Plan: Telephone follow up appointment with care management team member scheduled for:  05/04/2021

## 2021-02-09 NOTE — Patient Instructions (Signed)
Visit Information   PATIENT GOALS:   Goals Addressed             This Visit's Progress    Monitor and Manage My Blood Sugar-Diabetes Type 2       Timeframe:  Long-Range Goal Priority:  High Start Date:                             Expected End Date:                       Follow Up Date 05/04/2021    - check blood sugar at prescribed times - take the blood sugar log to all doctor visits - take the blood sugar meter to all doctor visits    Why is this important?   Checking your blood sugar at home helps to keep it from getting very high or very low.  Writing the results in a diary or log helps the doctor know how to care for you.  Your blood sugar log should have the time, date and the results.  Also, write down the amount of insulin or other medicine that you take.  Other information, like what you ate, exercise done and how you were feeling, will also be helpful.     Notes:      Track and Manage My Blood Pressure-Hypertension       Timeframe:  Long-Range Goal Priority:  High Start Date:                             Expected End Date:                       Follow Up Date 05/04/2021    - check blood pressure 3 times per week - choose a place to take my blood pressure (home, clinic or office, retail store) - write blood pressure results in a log or diary    Why is this important?   You won't feel high blood pressure, but it can still hurt your blood vessels.  High blood pressure can cause heart or kidney problems. It can also cause a stroke.  Making lifestyle changes like losing a little weight or eating less salt will help.  Checking your blood pressure at home and at different times of the day can help to control blood pressure.  If the doctor prescribes medicine remember to take it the way the doctor ordered.  Call the office if you cannot afford the medicine or if there are questions about it.     Notes:      Track Symptoms-Urinary Incontinence       Timeframe:   Long-Range Goal Priority:  High Start Date:                             Expected End Date:                       Follow Up Date 05/04/2021    - begin a voiding (peeing) diary - track number of times I do not get to the bathroom in time - track when, how much I go - use the diary for at least 3 days    Why is this important?   There are many reasons you may leak urine (pee).  Keeping a diary of symptoms can help you figure out why. This will help you and your doctor come up with a treatment plan.    Notes:         Consent to CCM Services: Debra Short was given information about Chronic Care Management services including:  CCM service includes personalized support from designated clinical staff supervised by her physician, including individualized plan of care and coordination with other care providers 24/7 contact phone numbers for assistance for urgent and routine care needs. Service will only be billed when office clinical staff spend 20 minutes or more in a month to coordinate care. Only one practitioner may furnish and bill the service in a calendar month. The patient may stop CCM services at any time (effective at the end of the month) by phone call to the office staff. The patient will be responsible for cost sharing (co-pay) of up to 20% of the service fee (after annual deductible is met).  Patient agreed to services and verbal consent obtained.   The patient verbalized understanding of instructions, educational materials, and care plan provided today and agreed to receive a mailed copy of patient instructions, educational materials, and care plan.   Telephone follow up appointment with care management team member scheduled for: 11/22  Signature: Fransico Michael, PharmD Clinical Pharmacist Shayona [email or web address] [phone]   CLINICAL CARE PLAN: Patient Care Plan: General Pharmacy (Adult)     Problem Identified: Hypertension, Hyperlipidemia, Diabetes, GERD, Chronic  Kidney Disease, Depression, Anxiety, Osteoarthritis, Overactive Bladder, and DDD, fibromyalgia, Osteopenia      Long-Range Goal: Patient-Specific Goal   Start Date: 01/19/2021  Expected End Date: 07/22/2021  Priority: High  Note:     Current Barriers:  Returned home after rehab, working on regaining strength to do every day tasks. Has assistance  Pharmacist Clinical Goal(s):  Patient will verbalize ability to afford treatment regimen achieve adherence to monitoring guidelines and medication adherence to achieve therapeutic efficacy through collaboration with PharmD and provider.   Interventions: 1:1 collaboration with Unk [name], MD regarding development and update of comprehensive plan of care as evidenced by provider attestation and co-signature Inter-disciplinary care team collaboration (see longitudinal plan of care) Comprehensive medication review performed; medication list updated in electronic medical record  Hypertension  (Status:New goal. Goal Met.)   Med Management Intervention: None  (BP goal <130/80) -Controlled -Current treatment: None -Medications previously tried: N/A  -Current home readings: nurses checking for patient, patient checking occasionally, all within range  -Current dietary habits: significant weight loss last 8 months Patient eating healthier (fruits and vegetables, reduced sugar) -Current exercise habits: Regular exercise regimen -Denies hypotensive/hypertensive symptoms -Educated on BP goals and benefits of medications for prevention of heart attack, stroke and kidney damage; Daily salt intake goal < 2300 mg; Importance of home blood pressure monitoring; Proper BP monitoring technique; -Counseled to monitor BP at home 2x/ week, document, and provide log at future appointments -Counseled on diet and exercise extensively Recommended to continue current medication  Hyperlipidemia: (LDL goal < 70) -Controlled -Current  treatment: None -Medications previously tried: N/A  -Current dietary patterns: See above -Current exercise habits: See above -Educated on Benefits of statin for ASCVD risk reduction; Importance of limiting foods high in cholesterol; Exercise goal of 150 minutes per week; -Counseled on diet and exercise extensively Recommended to continue current medication Assessed statin therapy benefits, TG elevated, LDL at goal, patient to continue lifestyle modifications  Diabetes (A1c goal <7%) -Controlled -Current medications: Metformin 2 am 2 pm (appropriate,  effective, safe, accessible) A1C: 5.1 01/12/2021 Diabetic eye exam: 08/11/2020 -Medications previously tried: N/A  -Current home glucose readings Ordered new BG device -Denies hypoglycemic/hyperglycemic symptoms -Current meal patterns:  Weight loss due to eliminating sugar in diet -Current exercise: regular exercise regimen -Educated on A1c and blood sugar goals; Complications of diabetes including kidney damage, retinal damage, and cardiovascular disease; Exercise goal of 150 minutes per week; Benefits of weight loss; Benefits of routine self-monitoring of blood sugar; Carbohydrate counting and/or plate method -Counseled to check feet daily and get yearly eye exams -Counseled on diet and exercise extensively Recommended to continue current medication  Depression/Anxiety -Controlled -Current treatment: Alprazolam 10m; 1/2 to 1 tablet two to three times a day as needed (appropriate, effective, safe, accessible) Fluoxetine 40 mg daily (appropriate, effective, safe, accessible) -Medications previously tried/failed: Effexor, nortriptyline, paxil, zoloft -PHQ9: 1; 01/19/2021 -GAD7: 0; 01/19/2021 -Educated on Benefits of medication for symptom control Benefits of cognitive-behavioral therapy with or without medication -Recommended to continue current medication  Osteopenia   TSH 1.93 01/12/2021 04:43 PM  VD25OH 84 01/12/2021 04:43  PM  CALCIUM 8.9 01/12/2021 Unable to locate results on file for 11/08/2017 scan -Unable to assess -Current treatment  Vitamin D 2000 IU daily (appropriate, effective, safe, accessible) -Medications previously tried: N/A  Patient is prone to falls, extensively discussed fall prevention -Recommend [phone] units of vitamin D daily. Recommend 1200 mg of calcium daily from dietary and supplemental sources. Recommend weight-bearing and muscle strengthening exercises for building and maintaining bone density. -Counseled on diet and exercise extensively Recommended to continue current medication  CKD   GFRNONAA >60 11/12/2020 -Controlled -Current treatment  None -Medications previously tried: N/A  -Counseled on importance of BG and BP control, avoiding NSAIDs  GERD -Controlled -Current treatment  Esomeprazole 40 mg daily (appropriate, effective, safe, accessible) -Medications previously tried: N/A  Patient states being on PPI for years, not a candidate for step down therapy at this time -Counseled on diet and exercise extensively  OA/ DDD/ Fibro/ Neuropathy -Controlled -Current treatment  Tylenol PRN (appropriate, effective, safe, accessible) Gabapentin 300 mg 2 caps BID (appropriate, effective, safe, accessible) -Medications previously tried: N/A Patient states tylenol provides adequate pain management at this time. Discussed switching fluoxetine to duloxetine for anxiety, depression and pain control but patient does not wish to make this change at this time  -Counseled on diet and exercise extensively Recommended to continue current medication  OAB -Controlled -Current treatment  None -Medications previously tried: N/A  Patient does not wish to start treatment at this time. Discussed avoiding liquids towards bedtime and timed voiding.   Health Maintenance -Vaccine gaps: Discussed with patient -Current therapy:  Vitamin D -Educated on Cost vs benefit of each product must  be carefully weighed by individual consumer -Patient is satisfied with current therapy and denies issues -Counseled on diet and exercise extensively  Patient Goals/Self-Care Activities Patient will:  - take medications as prescribed  Follow Up Plan: Telephone follow up appointment with care management team member scheduled for: 11/22

## 2021-02-10 ENCOUNTER — Other Ambulatory Visit: Payer: Self-pay | Admitting: Adult Health

## 2021-02-10 DIAGNOSIS — I10 Essential (primary) hypertension: Secondary | ICD-10-CM | POA: Diagnosis not present

## 2021-02-10 DIAGNOSIS — E1122 Type 2 diabetes mellitus with diabetic chronic kidney disease: Secondary | ICD-10-CM | POA: Diagnosis not present

## 2021-02-10 DIAGNOSIS — F411 Generalized anxiety disorder: Secondary | ICD-10-CM

## 2021-03-25 ENCOUNTER — Other Ambulatory Visit: Payer: Self-pay | Admitting: Internal Medicine

## 2021-03-25 ENCOUNTER — Other Ambulatory Visit: Payer: Self-pay | Admitting: Adult Health

## 2021-03-25 DIAGNOSIS — E1121 Type 2 diabetes mellitus with diabetic nephropathy: Secondary | ICD-10-CM

## 2021-03-26 NOTE — Progress Notes (Deleted)
MEDICARE ANNUAL WELLNESS VISIT AND FOLLOW UP  Assessment:   Encounter for Medicare annual wellness exam She will follow up ophthalmology, Dr. Oletta Lamas, and schedule mammogram with dexa   Atherosclerosis of aorta Per CXR 2019  Control blood pressure, cholesterol, glucose, increase exercise.   Essential hypertension - continue medications, DASH diet, exercise and monitor at home. Call if greater than 130/80.  -     CBC with Differential/Platelet -     CMP/GFR -     TSH  Controlled type 2 diabetes mellitus with diabetic nephropathy, without long-term current use of insulin (HCC) -     Hemoglobin A1c Discussed general issues about diabetes pathophysiology and management., Educational material distributed., Suggested low cholesterol diet., Encouraged aerobic exercise., Discussed foot care., Reminded to get yearly retinal exam and forward report Excellent recent controll; taper metformin as indicated by A1Cs  Type 2 diabetes mellitus with stage 2 chronic kidney disease, without long-term current use of insulin (HCC) -     Hemoglobin A1c Discussed general issues about diabetes pathophysiology and management., Educational material distributed., Suggested low cholesterol diet., Encouraged aerobic exercise., Discussed foot care., Reminded to get yearly retinal exam.  Degenerative disc disease, lumbar Followed by Dr. Estanislado Pandy   Primary osteoarthritis of both knees Followed by Dr. Estanislado Pandy  Mixed hyperlipidemia -     Lipid panel - -continue medications, check lipids, decrease fatty foods, increase activity.   Non-Hodgkin's lymphoma, unspecified body region, unspecified non-Hodgkin lymphoma type ([place]) Monitor symptoms, in remission, released by Dr. Jana Hakim  Depression, major, recurrent, in partial remission ([place]) Continue prozac, follow up with psych, benzo use is appropriate Lifestyle discussed: diet/exerise, sleep hygiene, stress management, hydration  Fibromyalgia  syndrome Continue medications, follow up with Dr. Estanislado Pandy  Medication management -     Magnesium  Vitamin D deficiency Continue supplement  Gastroesophageal reflux disease, esophagitis presence not specified Continue PPI/H2 blocker, diet discussed  Irritable bowel syndrome, unspecified type  If not on benefiber then add it, decrease stress,  if any worsening symptoms, blood in stool, AB pain, etc call office  BMi 24 Continue to recommend diet heavy in fruits and veggies and low in animal meats, cheeses, and dairy products, appropriate calorie intake Discuss exercise recommendations routinely Continue to monitor weight at each visit  Osteopenia Repeat DEXA 2021- ordered, patient will schedule, continue Vit D and Ca, weight bearing exercises  OAB Wears pull ups, improved with vesicare   Over 30 minutes of exam, counseling, chart review, and critical decision making was performed  Future Appointments  Date Time Provider [place]  03/30/2021  2:30 PM Liane Comber, NP [facility] None  05/04/2021 11:00 AM Rush Landmark, RPH [facility] None  09/17/2021 11:00 AM Unk [name], MD [facility] None    Plan:   During the course of the visit the patient was educated and counseled about appropriate screening and preventive services including:   Pneumococcal vaccine  Influenza vaccine Td vaccine Prevnar 13 Screening electrocardiogram Screening mammography Bone densitometry screening Colorectal cancer screening Diabetes screening Glaucoma screening Nutrition counseling  Advanced directives: given info/requested copies   Subjective:   Debra Short is a 80 y.o. female who presents for Medicare Annual Wellness Visit and 3 month follow up on hypertension, prediabetes, hyperlipidemia, vitamin D def.   She is thinking about applying to [employer] as she feels she is ready for extra assistance but has been deferring due to covid 19.    Non-hodgkin's was treated by Dr. Jana Hakim, in remission and monitored at  this office.   Patient has a long standing history of fibromyalgia syndrome followed by Dr Estanislado Pandy and previously prescribed hydrocodone but this was d/c'd related to concerns with falling, and she has been doing outpatient therapy. She is on prozac 40 mg daily, PRN xanax for depression/anxiety and insomnia, was seeing psych but hasn't followed up recently, she takes 1/4-1/2 tab PRN and estimates 1 month supply lasts 4 months. She stopped gabapentin for pain due blurry vision, doing tylenol.   She is vesicare for OAB and perceives significant benefit with this.   IBS managed by lifestyle  BMI is There is no height or weight on file to calculate BMI., she has been working on diet, exercise is limited. She has been doing meal replacement green shake (1 meal per day) and is down from 172 lb to 147 lb today.  Plans to start walking on treadmill at home.  Wt Readings from Last 3 Encounters:  01/12/21 135 lb (61.2 kg)  12/21/20 142 lb 6.4 oz (64.6 kg)  12/10/20 160 lb 0.9 oz (72.6 kg)   Her blood pressure has been controlled at home, today their BP is   Not on ACE/ARB due to low BPs She does not workout. She denies chest pain, shortness of breath, dizziness. She will occasionally get some walking in around the house or do stretches but is not formally exercising.    Has aortic atherosclerosis per CXR 2019   Takes atorvastatin 80 mg - takes 1/3 tab daily per patient, had myalgias with higher dose *** She is on cholesterol medication and denies myalgias. Her cholesterol is at goal. The cholesterol last visit was:   Lab Results  Component Value Date   CHOL 110 01/12/2021   HDL 46 (L) 01/12/2021   LDLCALC 39 01/12/2021   TRIG 175 (H) 01/12/2021   CHOLHDL 2.4 01/12/2021   She has been working on diet/exercise for T2DM well controlled on metformin, most recently back into normal range with improved diet and meds. She has  been taking metformin takes 2-3 tabs daily depending on diet. She denies hypoglycemia, extremity paraesthesias, unintentional weight loss, polydipsia, polyuria, blurry vision. She admits she does not check fasting glucose.  Lab Results  Component Value Date   HGBA1C 5.1 01/12/2021   CKD II associated with T2DM monitored at this office:  Lab Results  Component Value Date   GFRNONAA >60 11/12/2020   Patient is on Vitamin D supplement. Lab Results  Component Value Date   VD25OH 84 01/12/2021        Medication Review Current Outpatient Medications on File Prior to Visit  Medication Sig Dispense Refill   acetaminophen (TYLENOL) 650 MG CR tablet Take 650 mg by mouth See admin instructions. Take 650 mg by mouth at bedtime and an additional 650 mg during the day as needed for pain     ALPRAZolam (XANAX) 1 MG tablet TAKE 1/2-1 TABLET 2-3 TIMES PER DAY ONLY IF NEEDED FOR ANXIETY ATTACK & LIMIT TO 5 DAYS /WEEK TO AVOID ADDICTION & DEMENTIA 60 tablet 0   Cholecalciferol (VITAMIN D-3 PO) Take 1-2 capsules by mouth daily. 2000 iu per day     esomeprazole (NEXIUM) 40 MG capsule TAKE 1 CAPSULE DAILY TO PREVENT HEARTBURN & INDIGESTION 90 capsule 33   FLUoxetine (PROZAC) 40 MG capsule TAKE 1 CAPSULE DAILY FOR MOOD (Patient taking differently: Take 40 mg by mouth daily in the afternoon.) 90 capsule 3   fluticasone (FLONASE) 50 MCG/ACT nasal spray Place 1-2 sprays into both  nostrils daily as needed for allergies or rhinitis. 48 mL 4   gabapentin (NEURONTIN) 300 MG capsule Take  2 capsules  2 x /day  for Pain (Patient taking differently: Take 300 mg by mouth See admin instructions. Patient taking 2 towards bedtime) 360 capsule 0   metFORMIN (GLUCOPHAGE-XR) 500 MG 24 hr tablet TAKE 2 TABLETS BY MOUTH 2 X /DAY WITH MEALS FOR DIABETES 360 tablet 1   Multiple Vitamins-Minerals (ICAPS) CAPS Take 1 capsule by mouth daily with lunch.     Multiple Vitamins-Minerals (ONE-A-DAY PROACTIVE 65+) TABS Take 1 tablet by  mouth daily with breakfast.     Propylene Glycol (SYSTANE BALANCE OP) Place 1 drop into both eyes 4 (four) times daily as needed (dry eyes).     sodium chloride (MURO 128) 5 % ophthalmic solution Place 1 drop into both eyes in the morning, at noon, and at bedtime.     No current facility-administered medications on file prior to visit.    Allergies: Allergies  Allergen Reactions   Effexor [Venlafaxine] Other (See Comments)    Not effective   Nortriptyline Other (See Comments)    Not effective   Other Other (See Comments)    Spicy foods cause heartburn   Paxil [Paroxetine Hcl] Other (See Comments)    Not effective   Robaxin [Methocarbamol] Itching   Tizanidine Itching   Zoloft [Sertraline Hcl] Other (See Comments)    Not effective    Current Problems (verified) has Hyperlipidemia associated with type 2 diabetes mellitus ([place]); Essential hypertension; Gastroesophageal reflux disease; Vitamin D deficiency; IBS (irritable bowel syndrome); Type 2 diabetes mellitus with stage 2 chronic kidney disease, without long-term current use of insulin ([place]); Non Hodgkin's lymphoma ([place]); Medication management; Insomnia; CKD stage 2 due to type 2 diabetes mellitus ([place]); Degenerative disc disease, lumbar; Primary osteoarthritis of both knees; Fibromyalgia syndrome; Depression, major, recurrent, in partial remission ([place]); BMI 24.0-24.9, adult; OAB (overactive bladder); Osteopenia; Aortic atherosclerosis ([place]) by CXR in 2019; Nervous; and Frequent falls on their problem list.  Screening Tests Immunization History  Administered Date(s) Administered   Influenza Split 04/10/2013, 03/23/2016   Influenza, High Dose Seasonal PF 03/24/2014, 02/27/2015   Influenza-Unspecified 03/27/2018   Moderna Sars-Covid-2 Vaccination 07/26/2019, 08/23/2019, 04/21/2020   Pneumococcal Conjugate-13 03/24/2014   Pneumococcal Polysaccharide-23 06/13/2008   Td 09/24/2012   Tdap 09/26/2014, 09/25/2020, 11/02/2020   Zoster,  Live 02/23/2011    Preventative care: Last colonoscopy: 2013, diverticulosis, never polyps, Dr. Oletta Lamas, was recommended 5 year follow up that never happened due to pneumonia, declines further due to age Last mammogram:  10/2017 - patient states will schedule  DEXA 10/2017 - T -1.4 forearm  PAP: remote, DONE   Prior vaccinations: TD or Tdap: 2016    Influenza: 03/2018  Pneumococcal: 2010 Prevnar13: 2015 Shingles/Zostavax: 2012 Covid 19: 2/2, 2021, moderna  Names of Other Physician/Practitioners you currently use: 1. [facility]- here for primary care 2. [facility], eye doctor, 10/25/2018 - DUE - patient will schedule  3. Dr. Johnn Hai, dentist, last visit q67months 2020  Patient Care Team: Unk [name], MD as PCP - General (Internal Medicine) Bo Merino, MD as Consulting Physician (Rheumatology) [name], MD (Inactive) as Consulting Physician (Gastroenterology) Allyn Kenner, MD (Dermatology) Rush Landmark, [facility] as Pharmacist (Pharmacist)  Surgical: She  has a past surgical history that includes ORIF ankle fracture (Left, 09/26/2014); Abdominal hysterectomy; Tonsillectomy; and ORIF ankle fracture (Left, 09/26/2014). Family Her family history includes Cancer in her brother, father, maternal grandfather, and  mother. Social history  She reports that she has never smoked. She has never used smokeless tobacco. She reports that she does not drink alcohol and does not use drugs.  MEDICARE WELLNESS OBJECTIVES: Physical activity:   Cardiac risk factors:   Depression/mood screen:   Depression screen [facility] 2/9 01/17/2021  Decreased Interest 0  Down, Depressed, Hopeless 0  PHQ - 2 Score 0  Altered sleeping -  Tired, decreased energy -  Change in appetite -  Feeling bad or failure about yourself  -  Trouble concentrating -  Moving slowly or fidgety/restless -  Suicidal thoughts -  PHQ-9 Score -  Difficult doing work/chores -     ADLs:  In your present state of health, do you have any difficulty performing the following activities: 01/17/2021 11/03/2020  Hearing? N N  Vision? N N  Difficulty concentrating or making decisions? N Y  Walking or climbing stairs? N N  Dressing or bathing? N N  Doing errands, shopping? N N  Some recent data might be hidden     Cognitive Testing  Alert? Yes  Normal Appearance?Yes  Oriented to person? Yes  Place? Yes   Time? Yes  Recall of three objects?  Yes  Can perform simple calculations? Yes  Displays appropriate judgment?Yes  Can read the correct time from a watch face?Yes  EOL planning:     Objective:   There were no vitals filed for this visit.  There is no height or weight on file to calculate BMI. Wt Readings from Last 3 Encounters:  01/12/21 135 lb (61.2 kg)  12/21/20 142 lb 6.4 oz (64.6 kg)  12/10/20 160 lb 0.9 oz (72.6 kg)    General appearance: alert, no distress, WD/WN,  female HEENT: normocephalic, sclerae anicteric, TMs pearly, nares patent, no discharge or erythema, pharynx normal. R upper external nontender nodular lump without discharge Oral cavity: MMM, no lesions Neck: supple, no lymphadenopathy, no thyromegaly, + nodules on thyroid left side more than right, no masses Heart: RRR, normal S1, S2, no murmurs Lungs: CTA bilaterally, no wheezes, rhonchi, or rales Abdomen: +bs, soft, non tender, non distended, no masses, no hepatomegaly, no splenomegaly Musculoskeletal: nontender, no swelling, no obvious deformity Extremities: no edema, no cyanosis, no clubbing Pulses: 2+ symmetric, upper and lower extremities, normal cap refill Neurological: alert, oriented x 3, CN2-12 intact, strength normal upper extremities and lower extremities, sensation normal throughout, DTRs 2+ throughout, no cerebellar signs, gait slow Psychiatric: normal affect, behavior normal, pleasant    Medicare Attestation I have personally reviewed: The patient's medical and social  history Their use of alcohol, tobacco or illicit drugs Their current medications and supplements The patient's functional ability including ADLs,fall risks, home safety risks, cognitive, and hearing and visual impairment Diet and physical activities Evidence for depression or mood disorders  The patient's weight, height, BMI, and visual acuity have been recorded in the chart.  I have made referrals, counseling, and provided education to the patient based on review of the above and I have provided the patient with a written personalized care plan for preventive services.     Izora Ribas, NP   03/26/2021

## 2021-03-30 ENCOUNTER — Ambulatory Visit: Payer: PPO | Admitting: Adult Health

## 2021-03-30 DIAGNOSIS — M797 Fibromyalgia: Secondary | ICD-10-CM

## 2021-03-30 DIAGNOSIS — K219 Gastro-esophageal reflux disease without esophagitis: Secondary | ICD-10-CM

## 2021-03-30 DIAGNOSIS — G4700 Insomnia, unspecified: Secondary | ICD-10-CM

## 2021-03-30 DIAGNOSIS — Z79899 Other long term (current) drug therapy: Secondary | ICD-10-CM

## 2021-03-30 DIAGNOSIS — C8590 Non-Hodgkin lymphoma, unspecified, unspecified site: Secondary | ICD-10-CM

## 2021-03-30 DIAGNOSIS — F3341 Major depressive disorder, recurrent, in partial remission: Secondary | ICD-10-CM

## 2021-03-30 DIAGNOSIS — K589 Irritable bowel syndrome without diarrhea: Secondary | ICD-10-CM

## 2021-03-30 DIAGNOSIS — R296 Repeated falls: Secondary | ICD-10-CM

## 2021-03-30 DIAGNOSIS — M170 Bilateral primary osteoarthritis of knee: Secondary | ICD-10-CM

## 2021-03-30 DIAGNOSIS — Z6824 Body mass index (BMI) 24.0-24.9, adult: Secondary | ICD-10-CM

## 2021-03-30 DIAGNOSIS — N3281 Overactive bladder: Secondary | ICD-10-CM

## 2021-03-30 DIAGNOSIS — M85831 Other specified disorders of bone density and structure, right forearm: Secondary | ICD-10-CM

## 2021-03-30 DIAGNOSIS — E1169 Type 2 diabetes mellitus with other specified complication: Secondary | ICD-10-CM

## 2021-03-30 DIAGNOSIS — Z0000 Encounter for general adult medical examination without abnormal findings: Secondary | ICD-10-CM

## 2021-03-30 DIAGNOSIS — M5136 Other intervertebral disc degeneration, lumbar region: Secondary | ICD-10-CM

## 2021-03-30 DIAGNOSIS — E1122 Type 2 diabetes mellitus with diabetic chronic kidney disease: Secondary | ICD-10-CM

## 2021-03-30 DIAGNOSIS — I10 Essential (primary) hypertension: Secondary | ICD-10-CM

## 2021-03-30 DIAGNOSIS — I700 Atherosclerosis of aorta: Secondary | ICD-10-CM

## 2021-03-30 DIAGNOSIS — E559 Vitamin D deficiency, unspecified: Secondary | ICD-10-CM

## 2021-03-31 ENCOUNTER — Telehealth: Payer: Self-pay

## 2021-03-31 NOTE — Progress Notes (Signed)
Debra Short, Debra Short [id number] MEDIUM RISK  80 years, Female  DOB: 1940/06/17  M: [id number]  Section 1: Review Chart  1. What recent interventions/DTPs have been made by any provider to improve the patient's conditions in the last 3 months? None  2. Any recent hospitalizations or ED visits since last visit with CPP? No  Section 2: Adherence Review 1. Adherence rates for STAR metric medications (medication / day supply / last 2 fill dates) None  2. Adherence rates for medications indicated for disease state being reviewed (medication / day supply / last 2 fill dates) None  3. Does the patient have >5 day gap between last estimated fill dates for any of the above medications? We review for adherence No  Section 3: Disease State Questions  1. Able to connect with the Patient? Yes  1.a Did patient have any problems with their health recently? No  1.b Did patient have any problems with their pharmacy? No  1.c Does patient have any issues or side effects with their medications? No  1.d Additional information to pass to Patient's CPP? No  1.e Anything we can do to help take better care of Patient? No  2. Misc. Response/Information: N/A  Section 4: Pharmacist's Review  1. Adherence gaps identified? No  2. Drug Therapy Problems identified No  3. Assessment Controlled  4. Other notes [place], Health Concierge [phone]

## 2021-04-05 ENCOUNTER — Other Ambulatory Visit: Payer: Self-pay | Admitting: Adult Health

## 2021-04-06 ENCOUNTER — Encounter: Payer: Self-pay | Admitting: Internal Medicine

## 2021-04-06 ENCOUNTER — Other Ambulatory Visit: Payer: Self-pay

## 2021-04-06 ENCOUNTER — Ambulatory Visit (INDEPENDENT_AMBULATORY_CARE_PROVIDER_SITE_OTHER): Payer: PPO | Admitting: Internal Medicine

## 2021-04-06 VITALS — BP 136/76 | HR 89 | Temp 97.5°F | Resp 18 | Ht 64.0 in | Wt 131.6 lb

## 2021-04-06 DIAGNOSIS — L03116 Cellulitis of left lower limb: Secondary | ICD-10-CM

## 2021-04-06 DIAGNOSIS — J988 Other specified respiratory disorders: Secondary | ICD-10-CM

## 2021-04-06 DIAGNOSIS — L02416 Cutaneous abscess of left lower limb: Secondary | ICD-10-CM

## 2021-04-06 DIAGNOSIS — U071 COVID-19: Secondary | ICD-10-CM

## 2021-04-06 DIAGNOSIS — G4483 Primary cough headache: Secondary | ICD-10-CM

## 2021-04-06 DIAGNOSIS — J0410 Acute tracheitis without obstruction: Secondary | ICD-10-CM | POA: Diagnosis not present

## 2021-04-06 LAB — POC COVID19 BINAXNOW: SARS Coronavirus 2 Ag: POSITIVE — AB

## 2021-04-06 MED ORDER — DEXAMETHASONE 4 MG PO TABS: ORAL_TABLET | ORAL | 0 refills | Status: DC

## 2021-04-06 MED ORDER — PROMETHAZINE-PHENYLEPHRINE 6.25-5 MG/5ML PO SYRP: ORAL_SOLUTION | ORAL | 1 refills | Status: DC

## 2021-04-06 MED ORDER — DOXYCYCLINE HYCLATE 100 MG PO CAPS: ORAL_CAPSULE | ORAL | 0 refills | Status: DC

## 2021-04-06 NOTE — Progress Notes (Signed)
[place]    Future Appointments  Date Time Provider [place]  04/06/2021 11:00 AM Unk [name], MD [facility] None  05/04/2021 11:00 AM Newton Pigg, [facility] None  05/04/2021  3:30 PM Liane Comber, NP [facility] None  09/17/2021 11:00 AM Unk [name], MD [facility] None  03/30/2022  4:00 PM Liane Comber, NP [facility] None    History of Present Illness:     This very nice 80 yo WWF with labile HTN, HLD, T2_DM/CKD3a presents with a 1 week hx/o non-productive cough. Denies fevers, chills, sweats , dyspnea,  N/V/D or taste abberations. Covid test is (+) positive today. Patient also relate "insect bite of lateral lower Lt leg 2-3 days ago with moderate warm tender erythematous swelling with central induration. BP was initially elevated & rechecked at goal.  Medications  Current Outpatient Medications (Endocrine & Metabolic):    metFORMIN (GLUCOPHAGE-XR) 500 MG 24 hr tablet, TAKE 2 TABLETS BY MOUTH 2 X /DAY WITH MEALS FOR DIABETES   Current Outpatient Medications (Respiratory):    fluticasone (FLONASE) 50 MCG/ACT nasal spray, Place 1-2 sprays into both nostrils daily as needed for allergies or rhinitis.  Current Outpatient Medications (Analgesics):    acetaminophen (TYLENOL) 650 MG CR tablet, Take 650 mg by mouth See admin instructions. Take 650 mg by mouth at bedtime and an additional 650 mg during the day as needed for pain   Current Outpatient Medications (Other):    FLUoxetine (PROZAC) 40 MG capsule, TAKE 1 CAPSULE DAILY FOR MOOD   ALPRAZolam (XANAX) 1 MG tablet, TAKE 1/2-1 TABLET 2-3 TIMES PER DAY ONLY IF NEEDED FOR ANXIETY ATTACK & LIMIT TO 5 DAYS /WEEK TO AVOID ADDICTION & DEMENTIA   Cholecalciferol (VITAMIN D-3 PO), Take 1-2 capsules by mouth daily. 2000 iu per day   esomeprazole (NEXIUM) 40 MG capsule, TAKE 1 CAPSULE DAILY TO PREVENT HEARTBURN & INDIGESTION   gabapentin (NEURONTIN) 300 MG capsule, Take  2 capsules  2 x /day  for Pain (Patient taking  differently: Take 300 mg by mouth See admin instructions. Patient taking 2 towards bedtime)   Multiple Vitamins-Minerals (ICAPS) CAPS, Take 1 capsule by mouth daily with lunch.   Multiple Vitamins-Minerals (ONE-A-DAY PROACTIVE 65+) TABS, Take 1 tablet by mouth daily with breakfast.   Propylene Glycol (SYSTANE BALANCE OP), Place 1 drop into both eyes 4 (four) times daily as needed (dry eyes).   sodium chloride (MURO 128) 5 % ophthalmic solution, Place 1 drop into both eyes in the morning, at noon, and at bedtime.  Problem list She has Hyperlipidemia associated with type 2 diabetes mellitus ([place]); Essential hypertension; Gastroesophageal reflux disease; Vitamin D deficiency; IBS (irritable bowel syndrome); Type 2 diabetes mellitus with stage 2 chronic kidney disease, without long-term current use of insulin ([place]); Non Hodgkin's lymphoma ([place]); Medication management; Insomnia; CKD stage 2 due to type 2 diabetes mellitus ([place]); Degenerative disc disease, lumbar; Primary osteoarthritis of both knees; Fibromyalgia syndrome; Depression, major, recurrent, in partial remission ([place]); BMI 24.0-24.9, adult; OAB (overactive bladder); Osteopenia; Aortic atherosclerosis ([place]) by CXR in 2019; Nervous; and Frequent falls on their problem list.   Observations/Objective:  BP 136/76   Pulse 89   Temp (!) 97.5 F (36.4 C)   Resp 18   Ht 5\' 4"  (1.626 m)   Wt 131 lb 9.6 oz (59.7 kg)   SpO2 98%   BMI 22.59 kg/m   No stridor, cyanosis .  HEENT - WNL. Neck - supple.  Chest - Clear equal BS. Cor - Nl HS. RRR w/o  sig MGR. PP 1(+). No edema. MS- FROM w/o deformities.  Gait Nl. Neuro -  Nl w/o focal abnormalities. Skin - 3-4 " area of STS along the outer mid lateral Left leg. No lymphangitic streaking   Assessment and Plan:  1. Tracheitis  - dexamethasone 4 MG tablet; Take 1 tab 3 x day - 3 days, then 2 x day - 3 days, then 1 tab daily  Dispense: 20 tablet  - promethazine-phenylephrine 6.25-5 MG/5ML SYRP;   Use 1 to 2 teaspoons 4 x /day as needed for cough   Dispense: 473 mL; Refill: 1  2. Respiratory tract infection due to COVID-19 virus  - dexamethasone 4 MG tablet;  Take 1 tab 3 x day - 3 days, then 2 x day - 3 days, then 1 tab daily   Dispense: 20 tablet;   - promethazine-phenylephrine - C 6.25-5 MG/5ML SYRP;  Use 1 to 2 teaspoons 4 x /day as needed for cough   Dispense: 473 mL; Refill: 1  3. Cough headache  - POC COVID-19 - promethazine-VC)6.25-5 MG/5ML SYRP;  Use 1 to 2 teaspoons 4 x /day as needed for cough   Dispense: 473 mL; Refill: 1  4. Cellulitis and abscess of left leg  - doxycycline 100 MG capsule; Take 1 capsule 2 x /day with meals for Infection   Dispense: 60 capsule   Follow Up Instructions:        I discussed the assessment and treatment plan with the patient. The patient was provided an opportunity to ask questions and all were answered. The patient agreed with the plan and demonstrated an understanding of the instructions.       The patient was advised to call back or seek an in-person evaluation if the symptoms worsen or if the condition fails to improve as anticipated.    Kirtland Bouchard, MD

## 2021-04-26 ENCOUNTER — Other Ambulatory Visit: Payer: Self-pay | Admitting: Adult Health

## 2021-04-26 DIAGNOSIS — F411 Generalized anxiety disorder: Secondary | ICD-10-CM

## 2021-04-29 DIAGNOSIS — Z7984 Long term (current) use of oral hypoglycemic drugs: Secondary | ICD-10-CM | POA: Diagnosis not present

## 2021-04-29 DIAGNOSIS — F320 Major depressive disorder, single episode, mild: Secondary | ICD-10-CM | POA: Diagnosis not present

## 2021-04-29 DIAGNOSIS — E119 Type 2 diabetes mellitus without complications: Secondary | ICD-10-CM | POA: Diagnosis not present

## 2021-04-30 NOTE — Progress Notes (Deleted)
MEDICARE ANNUAL WELLNESS VISIT AND FOLLOW UP  Assessment:   Encounter for Medicare annual wellness exam Due annually  Health maintenance reviewed *** She will follow up ophthalmology, Dr. Oletta Lamas, and schedule mammogram with dexa   Atherosclerosis of aorta [facility]) Per CXR 2019  Control blood pressure, cholesterol, glucose, increase exercise.   Essential hypertension - continue medications, DASH diet, exercise and monitor at home. Call if greater than 130/80.  -     CBC with Differential/Platelet -     CMP/GFR -     TSH  Controlled type 2 diabetes mellitus with diabetic nephropathy, without long-term current use of insulin (HCC) -     Hemoglobin A1c Discussed general issues about diabetes pathophysiology and management., Educational material distributed., Suggested low cholesterol diet., Encouraged aerobic exercise., Discussed foot care., Reminded to get yearly retinal exam and forward report Excellent recent controll; taper metformin as indicated by A1Cs  Type 2 diabetes mellitus with stage 2 chronic kidney disease, without long-term current use of insulin (HCC) -     Hemoglobin A1c Discussed general issues about diabetes pathophysiology and management., Educational material distributed., Suggested low cholesterol diet., Encouraged aerobic exercise., Discussed foot care., Reminded to get yearly retinal exam.  Degenerative disc disease, lumbar Followed by Dr. Estanislado Pandy   Primary osteoarthritis of both knees Followed by Dr. Estanislado Pandy  Mixed hyperlipidemia -     Lipid panel - -continue medications, check lipids, decrease fatty foods, increase activity.   Non-Hodgkin's lymphoma, unspecified body region, unspecified non-Hodgkin lymphoma type ([place]) Monitor symptoms, in remission, released by Dr. Jana Hakim  Depression, major, recurrent, in partial remission ([place]) Continue prozac, follow up with psych, benzo use is appropriate *** Lifestyle discussed: diet/exerise, sleep hygiene,  stress management, hydration  Fibromyalgia syndrome Continue medications, follow up with Dr. Estanislado Pandy  Medication management -     Magnesium  Vitamin D deficiency Continue supplement  Gastroesophageal reflux disease, esophagitis presence not specified Continue PPI/H2 blocker, diet discussed  Irritable bowel syndrome, unspecified type  If not on benefiber then add it, decrease stress,  if any worsening symptoms, blood in stool, AB pain, etc call office  BMi 24 *** Continue to recommend diet heavy in fruits and veggies and low in animal meats, cheeses, and dairy products, appropriate calorie intake Discuss exercise recommendations routinely Continue to monitor weight at each visit  Osteopenia Repeat DEXA 2021- ordered ***, patient will schedule, continue Vit D and Ca, weight bearing exercises  OAB Wears pull ups, improved with vesicare ***  High risk for falls  ***   Over 30 minutes of exam, counseling, chart review, and critical decision making was performed  Future Appointments  Date Time Provider [place]  05/04/2021 11:00 AM Newton Pigg, [facility] None  05/04/2021  3:30 PM Liane Comber, NP [facility] None  09/17/2021 11:00 AM Unk [name], MD [facility] None  03/30/2022  4:00 PM Liane Comber, NP [facility] None    Plan:   During the course of the visit the patient was educated and counseled about appropriate screening and preventive services including:   Pneumococcal vaccine  Influenza vaccine Td vaccine Prevnar 13 Screening electrocardiogram Screening mammography Bone densitometry screening Colorectal cancer screening Diabetes screening Glaucoma screening Nutrition counseling  Advanced directives: given info/requested copies   Subjective:   Debra Short is a 80 y.o. female who presents for Medicare Annual Wellness Visit and 3 month follow up. She has Hyperlipidemia associated with type 2 diabetes mellitus ([place]); Essential  hypertension; Gastroesophageal reflux disease; Vitamin D deficiency; IBS (irritable bowel  syndrome); Type 2 diabetes mellitus with stage 2 chronic kidney disease, without long-term current use of insulin ([place]); Non Hodgkin's lymphoma ([place]); Medication management; Insomnia; CKD stage 2 due to type 2 diabetes mellitus ([place]); Degenerative disc disease, lumbar; Primary osteoarthritis of both knees; Fibromyalgia syndrome; Depression, major, recurrent, in partial remission ([place]); BMI 24.0-24.9, adult; OAB (overactive bladder); Osteopenia; Aortic atherosclerosis ([place]) by CXR in 2019; Nervous; and Frequent falls on their problem list.  Recent hx/o falls - had a Subdural Hematoma on May 23 and was discharged home 2 days later and after several ER visits for falls,  she was finally held til transferred to [facility] on 11/16/2020  Then on 6/30 a the NH, she slipped in the shower sustaining a laceration of her posterior scalp requiring stapling.  Head & Cx CT scans found no acute pathology.   She was discharged from NH back home on 12/11/2020. ***     She is on prozac 40 mg daily, PRN xanax for depression/anxiety and insomnia, was seeing psych but hasn't followed up recently ***, she takes 1/4-1/2 tab PRN and estimates 1 month supply lasts 4 months. She stopped gabapentin for pain due blurry vision ***, doing tylenol.   Gabapentin *** xanax ***   She is thinking about applying to [employer] homes as she feels she is ready for extra assistance but has been deferring due to covid 19.   Non-hodgkin's was treated by Dr. Jana Hakim, in remission and monitored at this office.   Patient has a long standing history of fibromyalgia syndrome followed by Dr Estanislado Pandy and previously prescribed hydrocodone but this was d/c'd related to concerns with falling.   She is vesicare *** for OAB and perceives significant benefit with this.   IBS managed by lifestyle  BMI is There is no height or weight on file to  calculate BMI., she has been working on diet, exercise is limited. She has been doing meal replacement green shake (1 meal per day) and is down from 172 lb to 147 lb today.  Plans to start walking on treadmill at home.  Wt Readings from Last 3 Encounters:  04/06/21 131 lb 9.6 oz (59.7 kg)  01/12/21 135 lb (61.2 kg)  12/21/20 142 lb 6.4 oz (64.6 kg)   Has aortic atherosclerosis per CXR 2019  Her blood pressure has been controlled at home, today their BP is   Not on ACE/ARB due to low BPs She does not workout. She denies chest pain, shortness of breath, dizziness. She will occasionally get some walking in around the house or do stretches but is not formally exercising.    Takes atorvastatin 80 mg - takes 1/3 tab daily per patient, had myalgias with higher dose ***not on med list - re add *** She is on cholesterol medication and denies myalgias. Her cholesterol is at goal. The cholesterol last visit was:   Lab Results  Component Value Date   CHOL 110 01/12/2021   HDL 46 (L) 01/12/2021   LDLCALC 39 01/12/2021   TRIG 175 (H) 01/12/2021   CHOLHDL 2.4 01/12/2021   She has been working on diet/exercise for T2DM well controlled on metformin, most recently back into normal range. She has been taking metformin takes 2-3 tabs daily depending on diet. She denies hypoglycemia, extremity paraesthesias, unintentional weight loss, polydipsia, polyuria, blurry vision. She admits she does not check fasting glucose.  Lab Results  Component Value Date   HGBA1C 5.1 01/12/2021   CKD II associated with T2DM  monitored at this office:  Lab Results  Component Value Date   GFRNONAA >60 11/12/2020   Patient is on Vitamin D supplement. Lab Results  Component Value Date   VD25OH 84 01/12/2021        Medication Review Current Outpatient Medications on File Prior to Visit  Medication Sig Dispense Refill   acetaminophen (TYLENOL) 650 MG CR tablet Take 650 mg by mouth See admin instructions. Take 650 mg by  mouth at bedtime and an additional 650 mg during the day as needed for pain     ALPRAZolam (XANAX) 1 MG tablet TAKE 1/2-1 TABLET 2-3 TIMES PER DAY ONLY IF NEEDED FOR ANXIETY ATTACK & LIMIT TO 5 DAYS /WEEK TO AVOID ADDICTION & DEMENTIA 60 tablet 0   Cholecalciferol (VITAMIN D-3 PO) Take 1-2 capsules by mouth daily. 2000 iu per day     dexamethasone (DECADRON) 4 MG tablet Take 1 tab 3 x day - 3 days, then 2 x day - 3 days, then 1 tab daily 20 tablet 0   doxycycline (VIBRAMYCIN) 100 MG capsule Take 1 capsule 2 x /day with meals for Infection 60 capsule 0   esomeprazole (NEXIUM) 40 MG capsule TAKE 1 CAPSULE DAILY TO PREVENT HEARTBURN & INDIGESTION 90 capsule 33   FLUoxetine (PROZAC) 40 MG capsule TAKE 1 CAPSULE DAILY FOR MOOD 90 capsule 3   fluticasone (FLONASE) 50 MCG/ACT nasal spray Place 1-2 sprays into both nostrils daily as needed for allergies or rhinitis. 48 mL 4   gabapentin (NEURONTIN) 300 MG capsule Take  2 capsules  2 x /day  for Pain (Patient taking differently: Take 300 mg by mouth See admin instructions. Patient taking 2 towards bedtime) 360 capsule 0   metFORMIN (GLUCOPHAGE-XR) 500 MG 24 hr tablet TAKE 2 TABLETS BY MOUTH 2 X /DAY WITH MEALS FOR DIABETES 360 tablet 1   Multiple Vitamins-Minerals (ICAPS) CAPS Take 1 capsule by mouth daily with lunch.     Multiple Vitamins-Minerals (ONE-A-DAY PROACTIVE 65+) TABS Take 1 tablet by mouth daily with breakfast.     promethazine-phenylephrine (PROMETHAZINE VC) 6.25-5 MG/5ML SYRP Use 1 to 2 teaspoons 4 x /day as needed for cough 473 mL 1   Propylene Glycol (SYSTANE BALANCE OP) Place 1 drop into both eyes 4 (four) times daily as needed (dry eyes).     sodium chloride (MURO 128) 5 % ophthalmic solution Place 1 drop into both eyes in the morning, at noon, and at bedtime.     No current facility-administered medications on file prior to visit.    Allergies: Allergies  Allergen Reactions   Effexor [Venlafaxine] Other (See Comments)    Not  effective   Nortriptyline Other (See Comments)    Not effective   Other Other (See Comments)    Spicy foods cause heartburn   Paxil [Paroxetine Hcl] Other (See Comments)    Not effective   Robaxin [Methocarbamol] Itching   Tizanidine Itching   Zoloft [Sertraline Hcl] Other (See Comments)    Not effective    Current Problems (verified) has Hyperlipidemia associated with type 2 diabetes mellitus ([place]); Essential hypertension; Gastroesophageal reflux disease; Vitamin D deficiency; IBS (irritable bowel syndrome); Type 2 diabetes mellitus with stage 2 chronic kidney disease, without long-term current use of insulin ([place]); Non Hodgkin's lymphoma ([place]); Medication management; Insomnia; CKD stage 2 due to type 2 diabetes mellitus ([place]); Degenerative disc disease, lumbar; Primary osteoarthritis of both knees; Fibromyalgia syndrome; Depression, major, recurrent, in partial remission ([place]); BMI 24.0-24.9, adult; OAB (overactive bladder); Osteopenia;  Aortic atherosclerosis ([place]) by CXR in 2019; Nervous; and Frequent falls on their problem list.  Screening Tests Immunization History  Administered Date(s) Administered   Influenza Split 04/10/2013, 03/23/2016   Influenza, High Dose Seasonal PF 03/24/2014, 02/27/2015   Influenza-Unspecified 03/27/2018   Moderna Sars-Covid-2 Vaccination 07/26/2019, 08/23/2019, 04/21/2020   Pneumococcal Conjugate-13 03/24/2014   Pneumococcal Polysaccharide-23 06/13/2008   Td 09/24/2012   Tdap 09/26/2014, 09/25/2020, 11/02/2020   Zoster, Live 02/23/2011    Preventative care: Last colonoscopy: 2013, diverticulosis, never polyps, Dr. Oletta Lamas, was recommended 5 year follow up that never happened due to pneumonia, declines further due to age Last mammogram:  10/2017 - patient states will schedule *** DEXA 10/2017 - T -1.4 forearm *** PAP: remote, DONE   Prior vaccinations: TD or Tdap: 2016, ? 2022 ***   Influenza: 03/2018 *** Pneumococcal: 2010 Prevnar13:  2015 Shingles/Zostavax: 2012 Covid 19: 2/2, 2021, moderna + booster last ***  Names of Other Physician/Practitioners you currently use: 1. [facility]- here for primary care 2. [facility], eye doctor, 10/25/2018 - DUE - patient will schedule  3. Dr. Johnn Hai, dentist, last visit q58months 2020  Patient Care Team: Unk [name], MD as PCP - General (Internal Medicine) Bo Merino, MD as Consulting Physician (Rheumatology) [name], MD (Inactive) as Consulting Physician (Gastroenterology) Allyn Kenner, MD (Dermatology) Rush [facility], [facility] as Pharmacist (Pharmacist)  Surgical: She  has a past surgical history that includes ORIF ankle fracture (Left, 09/26/2014); Abdominal hysterectomy; Tonsillectomy; and ORIF ankle fracture (Left, 09/26/2014). Family Her family history includes Cancer in her brother, father, maternal grandfather, and mother. Social history  She reports that she has never smoked. She has never used smokeless tobacco. She reports that she does not drink alcohol and does not use drugs.  MEDICARE WELLNESS OBJECTIVES: Physical activity:   Cardiac risk factors:   Depression/mood screen:   Depression screen [facility] 2/9 01/17/2021  Decreased Interest 0  Down, Depressed, Hopeless 0  PHQ - 2 Score 0  Altered sleeping -  Tired, decreased energy -  Change in appetite -  Feeling bad or failure about yourself  -  Trouble concentrating -  Moving slowly or fidgety/restless -  Suicidal thoughts -  PHQ-9 Score -  Difficult doing work/chores -    ADLs:  In your present state of health, do you have any difficulty performing the following activities: 01/17/2021 11/03/2020  Hearing? N N  Vision? N N  Difficulty concentrating or making decisions? N Y  Walking or climbing stairs? N N  Dressing or bathing? N N  Doing errands, shopping? N N  Some recent data might be hidden     Cognitive Testing  Alert? Yes  Normal  Appearance?Yes  Oriented to person? Yes  Place? Yes   Time? Yes  Recall of three objects?  Yes  Can perform simple calculations? Yes  Displays appropriate judgment?Yes  Can read the correct time from a watch face?Yes  EOL planning:     Objective:   There were no vitals filed for this visit.  There is no height or weight on file to calculate BMI. Wt Readings from Last 3 Encounters:  04/06/21 131 lb 9.6 oz (59.7 kg)  01/12/21 135 lb (61.2 kg)  12/21/20 142 lb 6.4 oz (64.6 kg)    General appearance: alert, no distress, WD/WN,  female HEENT: normocephalic, sclerae anicteric, TMs pearly, nares patent, no discharge or erythema, pharynx normal. R upper external nontender nodular lump without discharge Oral cavity: MMM, no lesions Neck:  supple, no lymphadenopathy, no thyromegaly, + nodules on thyroid left side more than right, no masses Heart: RRR, normal S1, S2, no murmurs Lungs: CTA bilaterally, no wheezes, rhonchi, or rales Abdomen: +bs, soft, non tender, non distended, no masses, no hepatomegaly, no splenomegaly Musculoskeletal: nontender, no swelling, no obvious deformity Extremities: no edema, no cyanosis, no clubbing Pulses: 2+ symmetric, upper and lower extremities, normal cap refill Neurological: alert, oriented x 3, CN2-12 intact, strength normal upper extremities and lower extremities, sensation normal throughout, DTRs 2+ throughout, no cerebellar signs, gait slow Psychiatric: normal affect, behavior normal, pleasant    Medicare Attestation I have personally reviewed: The patient's medical and social history Their use of alcohol, tobacco or illicit drugs Their current medications and supplements The patient's functional ability including ADLs,fall risks, home safety risks, cognitive, and hearing and visual impairment Diet and physical activities Evidence for depression or mood disorders  The patient's weight, height, BMI, and visual acuity have been recorded in the  chart.  I have made referrals, counseling, and provided education to the patient based on review of the above and I have provided the patient with a written personalized care plan for preventive services.     Izora Ribas, NP   04/30/2021

## 2021-05-04 ENCOUNTER — Other Ambulatory Visit: Payer: Self-pay | Admitting: Internal Medicine

## 2021-05-04 ENCOUNTER — Other Ambulatory Visit: Payer: Self-pay

## 2021-05-04 ENCOUNTER — Ambulatory Visit: Payer: PPO | Admitting: Pharmacist

## 2021-05-04 ENCOUNTER — Ambulatory Visit: Payer: PPO | Admitting: Adult Health

## 2021-05-04 DIAGNOSIS — Z6824 Body mass index (BMI) 24.0-24.9, adult: Secondary | ICD-10-CM

## 2021-05-04 DIAGNOSIS — M85831 Other specified disorders of bone density and structure, right forearm: Secondary | ICD-10-CM

## 2021-05-04 DIAGNOSIS — R296 Repeated falls: Secondary | ICD-10-CM

## 2021-05-04 DIAGNOSIS — I10 Essential (primary) hypertension: Secondary | ICD-10-CM

## 2021-05-04 DIAGNOSIS — G4700 Insomnia, unspecified: Secondary | ICD-10-CM

## 2021-05-04 DIAGNOSIS — N3281 Overactive bladder: Secondary | ICD-10-CM

## 2021-05-04 DIAGNOSIS — F3341 Major depressive disorder, recurrent, in partial remission: Secondary | ICD-10-CM

## 2021-05-04 DIAGNOSIS — E1122 Type 2 diabetes mellitus with diabetic chronic kidney disease: Secondary | ICD-10-CM

## 2021-05-04 DIAGNOSIS — K589 Irritable bowel syndrome without diarrhea: Secondary | ICD-10-CM

## 2021-05-04 DIAGNOSIS — Z79899 Other long term (current) drug therapy: Secondary | ICD-10-CM

## 2021-05-04 DIAGNOSIS — E559 Vitamin D deficiency, unspecified: Secondary | ICD-10-CM

## 2021-05-04 DIAGNOSIS — Z961 Presence of intraocular lens: Secondary | ICD-10-CM | POA: Diagnosis not present

## 2021-05-04 DIAGNOSIS — E785 Hyperlipidemia, unspecified: Secondary | ICD-10-CM

## 2021-05-04 DIAGNOSIS — E1169 Type 2 diabetes mellitus with other specified complication: Secondary | ICD-10-CM

## 2021-05-04 DIAGNOSIS — M170 Bilateral primary osteoarthritis of knee: Secondary | ICD-10-CM

## 2021-05-04 DIAGNOSIS — M797 Fibromyalgia: Secondary | ICD-10-CM

## 2021-05-04 DIAGNOSIS — C8590 Non-Hodgkin lymphoma, unspecified, unspecified site: Secondary | ICD-10-CM

## 2021-05-04 DIAGNOSIS — H353131 Nonexudative age-related macular degeneration, bilateral, early dry stage: Secondary | ICD-10-CM | POA: Diagnosis not present

## 2021-05-04 DIAGNOSIS — Z0000 Encounter for general adult medical examination without abnormal findings: Secondary | ICD-10-CM

## 2021-05-04 DIAGNOSIS — K219 Gastro-esophageal reflux disease without esophagitis: Secondary | ICD-10-CM

## 2021-05-04 DIAGNOSIS — N182 Chronic kidney disease, stage 2 (mild): Secondary | ICD-10-CM

## 2021-05-04 DIAGNOSIS — I700 Atherosclerosis of aorta: Secondary | ICD-10-CM

## 2021-05-04 MED ORDER — MIRTAZAPINE 15 MG PO TABS: ORAL_TABLET | ORAL | 2 refills | Status: DC

## 2021-05-04 NOTE — Progress Notes (Signed)
Follow Up Patient Visit  Debra Short, Debra Short [id number] 14 years, Female  DOB: [birth year]  M: [phone] Care Team:  Jasper Riling   __________________________________________________ Summary: Pt is a pleasant 80 year old female who presents with a chief complaint of tiredness, poor appetite and sleep difficulty. She has been having low appetite since spring and is concerned about her weight (131 lbs today). She also states having difficult with kidneys, she wakes up multiple times in the night to urinate (overactive bladder). She states she needs to follow with urologist for management of this.   Recommendations/Changes made from today's visit: Recommend patient follow with urologist to discuss her symptoms of urinary frequency Consider initiating Mirtazapine 7.5mg  QD to help patient with sleep, may also help improve appetite as well Recommended that patient also start Calcium Citrate 500mg  BID for bone health   Patient's Chronic Conditions: Hypertension (HTN), Hyperlipidemia/Dyslipidemia (HLD), Chronic Kidney Disease (CKD), Diabetes (DM), Gastroesophageal Reflux Disease (GERD), Osteoarthritis, Osteopenia or Osteoporosis, Overactive Bladder (OAB), Insomnia, Other, Depression List Other Conditions (separated by comma): Vitamin D deficiency, Non-Hodgkins Lymphoma, Fibromyalgia  . Doctor and Hospital Visits Were there PCP Visits since last visit with the Pharmacist?: Yes Visit #1: 04/06/2021- Dr. Mamie Levers presented for respiratory infection due to covid. BP 136/76, HR 89. Start Dexamethasone 4mg  as directed. Start Doxycycline 100mg , 2 times daily. Start promethazine-phenylephrine 6.25/5mg /52ml, 1-2 tsp 4 times daily as needed.  Were there Specialist Visits since last visit with the Pharmacist?: No Was there a Hospital Visit in last 30 days?: No Were there other Hospital Visits since last visit with the Pharmacist?: No . Medication Information Have there been any  medication changes from PCP or Specialist since last visit with the Pharmacist?: No Are there any Medication adherence gaps (beyond 5 days past due)?: No Medication adherence rates for the STAR rating drugs: None List Patient's current Care Gaps: No current Care Gaps identified . Pre-Call Questions [facility]) Are you able to connect with Patient?: No Were you able to leave a message?: Yes Relevant details: 05/03/21-Left detailed VM and to return call. . Disease Assessments . Subjective Information Current BP: 136/76 Current HR: 89 taken on: 04/05/2021 Weight: 131 BMI: 22.59 Last GFR: 81 taken on: 01/11/2021 Why did the patient present?: CCM follow-up visit Who does the patient spend their time with and what do they do?: Patient lives alon but also has a home aid that comes 2-3 times a week to take care of her. She also has a son and grandson who visit every other week.  Factors that may affect medication adherence?: Disability Is Patient using [facility]?: No Name and location of Current pharmacy: [id number] - [place], [place] (Ph: [phone]) Current Rx insurance plan: HTA Are meds synced by current pharmacy?: No Are meds delivered by current pharmacy?: No - delivery available but patient prefers to not use Would patient benefit from direct intervention of clinical lead in dispensing process to optimize clinical outcomes?: Yes Are [facility] pharmacy services available where patient lives?: Yes Is patient disadvantaged to use [facility]?: No [facility] services reviewed with patient and patient wishes to change pharmacy?: No Select reason patient declined to change pharmacies: Patient preference Does patient experience delays in picking up medications due to transportation concerns (getting to pharmacy)?: No Any additional demeanor/mood notes?: Pt is a pleasant 80 year old female who presents with a  chief complaint of tiredness, poor appetite and sleep difficulty. She  has been having low appetite since spring and is concerned about her weight (131 lbs today). She also states having difficult with kidneys, she wakes up multiple times in the night to urinate (overactive bladder). She states she needs to follow with urologist for management of this.  . Hypertension (HTN) Assess this condition today?: Yes Is patient able to obtain BP reading today?: Yes BP today is: 115/70 Heart Rate is: 97 Goal: <130/80 mmHG Hypertension Stage: Stage 1 (SBP: 130-139 or DBP: 80-89) Is Patient checking BP at home?: No How often does patient miss taking their blood pressure medications?: No BP meds Has patient experienced hypotension, dizziness, falls or bradycardia?: Yes Provide Details: Pt does not endorse dizziness but has had falls not related to hypotension Check present secondary causes (below) for HTN: CKD, Obesity Does Patient use RPM device?: No BP RPM device: Does patient qualify?: No We discussed: Reducing the amount of salt intake to 1500mg /per day., Recommend using a salt substitute to replace your salt if you need flavor. Assessment:: Controlled Drug: None Assessment: Query need HC Follow up: N//A Pharmacist Follow up: Monitor BP at office visits, monitor for any falls . Hyperlipidemia/Dyslipidemia (HLD) Last Lipid panel on: 01/11/2021 TC (Goal<200): 110 LDL: 39 HDL (Goal>40): 46 TG (Goal<150): 175 ASCVD 10-year risk?is:: N/A due to Age > 79 Assess this condition today?: Yes LDL Goal: <70 Has patient tried and failed any HLD Medications?: Yes Medications failed: atorvastatin, pravastatin atorvastatin: D/c'd per age pravastatin: Therapy change Check present secondary causes (below) that can lead to increased cholesterol levels (multi-choice optional): CKD, Diabetes We discussed: Encouraged increasing fiber to a daily intake of 10-25g/day, How a diet high in plant sterols  (fruits/vegetables/nuts/whole grains/legumes) may reduce your cholesterol., Other (provide details below) Details: Watch foods high in triglycerides: sugary drinks/sodas, cake, cookies, high fat dairy products, fried foods Assessment:: Controlled Drug: None, lifestyle modifications Assessment: Appropriate, Effective, Safe, Accessible HC Follow up: N/A Pharmacist Follow up: Lipid panel TSH . Diabetes (DM) Current A1C: 5.1 taken on: 01/11/2021 Type: 2 The current microalbumin ratio is: 16 tested on: 09/15/2020 Assess this condition today?: No . Depression Previous PHQ-9 Score: N/A Assess this condition today?: Yes Completing the PHQ-9 Questionnaire today?: Yes PHQ-9: Over the last 2 weeks, how often have you been bothered by the following problems?: Done Little interest or pleasure in doing things: 0 (Not at all) Feeling down, depressed, or hopeless: 2 (More than half of the days) Trouble falling or staying asleep, or sleeping too much: 3 (Nearly every day) Feeling tired or having little energy: 2 (More than half of the days) Poor appetite or overeating: 3 (Nearly every day) Feeling bad about yourself  or that you are a failure or have let yourself or your family down: 0 (Not at all) Trouble concentrating on things, such as reading the newspaper or watching television: 0 (Not at all) Moving or speaking so slowly that other people could have noticed? Or the opposite - being so fidgety or restless that you have been moving around a to more than usual: 0 (Not at all) Thoughts that you would be better off dead or of hurting yourself in some way: 0 (Not at all) Total PHQ-9) Score (please total responses for questions  above): 10 Depression Severity: Moderate Depression (Score: 10-14) In your opinion, how do you feel your depression symptoms have been controlled over the past 3 months?: Worsened Patient has tried and failed: Venlafaxine Nortriptyline Paxil Sertraline Assessment::  Uncontrolled Drug: Fluoxetine 40mg  QD Assessment: Appropriate, Effective,  Safe, Accessible Additional Info: sis with breast cancer and son and grand son  Pt states that her mood has gotten worse due to readjusting since the fall and being discharged from [facility] transitioning back to home. Sleep has been very difficult due to waking up multiple times a night due to urinary urgency.  Plan to (other): Pt will follow up with urologist, and will reach out to provider about potentially starting Mirtazapine low dose (which could help with mood, sleep, and appetite) HC Follow up: N/A Pharmacist Follow up: Assess mood at next follow-up visit, asses if pt was able to follow-up with urologist . Chronic kidney disease (CKD) Previous GFR: 81 taken on: 01/11/2021 The current microalbumin ratio is: 16 tested on: 09/15/2020 Assess this condition today?: Yes CKD Stage: Stage 2 (GFR 61-90 ml/min) Albuminuria Stage: A1 (<30) Contributing factors for developing CKD: Diabetes, HTN Is Patient taking statin medication: No Reason patient is not taking statin(s): Age Is patient taking ACEi / ARB?: No Renal dose adjustments recommended?: No We discussed: Limiting dietary sodium intake to less than 2000 mg / day, Maintaining blood pressure control, Maintaining blood glucose control, Avoidance of nephrotoxic drugs (NSAIDs) Assessment:: Controlled Drug: None Assessment: Query need HC Follow up: N/A Pharmacist Follow up: assess ScCr, GFR at next visit . Osteopenia or Osteoporosis Current T-score: Left Forearm Radius: -1.7      DualFemur Neck Right:  -1.3 taken on: 11/07/2017 Current Vitamin D 25-OH: 84 taken on: 01/11/2021 Assess this condition today?: Yes Patient has: Osteopenia In the past 12 months, have you fallen?: Yes What were the circumstances?: Slipped and fell on a concrete floor at shopping [place] on hardwood floor at home  Also fell while in nursing home which led to a concussion  Pt now  has a home aid who visits 2-3 times per week to ensure a safe environment at home and that she does not fall How many times have you fallen?: 4 times Are there any stairs in or around the home?: No Is the home free of loose throw rugs in walkways, pet beds, electrical cords, etc.?: No Is there adequate lighting in your home to reduce the risk of falls?: Yes Dietary calcium intake:   We discussed: Weight bearing exercises (walking, light weights, resistance training), Dietary calcium intake, Fall prevention Assessment:: Controlled Drug: Vitamin D3 2000 units take 1-2 capsules per day Assessment: Appropriate, Effective, Safe, Accessible Plan to Start: Start Calcium Citrate 500mg  BID HC Follow up: N/A Pharmacist Follow up: Follow-up with patient to assess any recent falls . Overactive Bladder (OAB) Assess this condition today?: Yes Patient is experiencing follow symptoms: Urgency, Frequency How often are you waking at night to urinate?: 2 times We discussed: Timed voiding, Limiting fluid intake for 2-3 hours prior to bedtime Patient has tried and failed: Vesicare 10mg  QD Oxybutynin Assessment:: Uncontrolled Drug: None Assessment: Query need Plan to (other): Pt to follow with urologist for treatment of overactive bladder HC Follow up: Overactive bladder assessment: Call patient end of December to assess whether they were able to follow with urologist and if symptoms have improved. Also assess if sleep has consequently improved due to better control of overactive bladder Pharmacist Follow up: Assess control of symptoms at next visit . Insomnia Assess this condition today?: Yes Patient has following issues with sleeping: Trouble falling asleep, Trouble staying asleep, Trouble falling back to sleep after waking What changes have you made to your bedtime routine to help with sleep?: Avoiding screens (TV, phone, tablets) 30-60 min  prior to bed, Going to bed earlier We discussed:: Establishing  a nighttime routine including a consistent bedtime, Ensuring room is cool and dark when trying to sleep, Avoiding screens (TV, phone, tablets) prior to bedtime Patient has tried and failed: Xanax  Assessment:: Uncontrolled Drug: Gabapentin 300mg  tablets take 2 tablets at night Assessment: Appropriate, Query Effectiveness Additional Info: Pt is needing to wake up multiple times during the night to urinate and has difficulty falling asleep afterwards. Pt would benefit from following with urologist and also would benefit from initiating mirtazapine 7.5mg  to aid with falling asleep, can also help with appetite Plan to (other): Will recommend starting Mirtazapine 7.5mg  QD to help patient with sleep HC Follow up: February: Insomnia assessment Pharmacist Follow up: Will follow up with pt on sleep at next visit . Exercise, Diet and Non-Drug Coordination Needs Additional exercise counseling points. We discussed: incorporating flexibility, balance, and strength training exercises, decreasing sedentary behavior Discussed Non-Drug Care Coordination Needs: Yes Does Patient have Medication financial barriers?: No . Accountable Health Communities Health-Related Social Needs Screening Tool -  SDOH  ([email or web address]) What is your living situation today? (ref #1): I have a steady place to live Think about the place you live. Do you have problems with any of the following? (ref #2): None of the above Within the past 12 months, you worried that your food would run out before you got money to buy more (ref #3): Never true Within the past 12 months, the food you bought just didn't last and you didn't have money to get more (ref #4): Never true In the past 12 months, has lack of reliable transportation kept you from medical appointments, meetings, work or from getting things needed for daily living? (ref #5): No In the past 12 months, has the electric, gas,  oil, or water company threatened to shut off services in your home? (ref #6): No How often does anyone, including family and friends, physically hurt you? (ref #7): Never (1) How often does anyone, including family and friends, insult or talk down to you? (ref #8): Never (1) How often does anyone, including friends and family, threaten you with harm? (ref #9): Never (1) How often does anyone, including family and friends, scream or curse at you? (ref #10): Never (1) . Engagement Notes Newton Pigg on 05/04/2021 12:16 PM HC F/u:  Overactive bladder assessment: Call patient end of December to assess whether they were able to follow with urologist and if symptoms have improved. Also assess if sleep has consequently improved due to better control of overactive bladder February: sleep assessment  CPP F/u:  05/13/21 - OV w/ Caryl Pina 09/17/21 - OV w/ Dr. Melford Aase 11/02/21 -CCM follow up phone call (I would assess Diabetes and ensure pt is not having any lows, assess if sleep improved, depression, OAB, and pain improved)  Care Gaps: unable to assess during this visit due to time . Engagement Notes Newton Pigg on 05/04/2021 11:04 AM CPP Chart Review: 16 min  CPP Office Visit: 26 min  CPP Office Visit Documentation: 42 min CPP Coordination of Care:  [facility] Care Plan Completion:  CPP Care Plan Review: HC Chart Prep: 43min.  Rachelle Hora Jeannett Senior, PharmD   Clinical Pharmacist   [email or web address]   [phone]

## 2021-05-12 DIAGNOSIS — I10 Essential (primary) hypertension: Secondary | ICD-10-CM | POA: Diagnosis not present

## 2021-05-12 DIAGNOSIS — E1122 Type 2 diabetes mellitus with diabetic chronic kidney disease: Secondary | ICD-10-CM | POA: Diagnosis not present

## 2021-05-13 ENCOUNTER — Encounter: Payer: Self-pay | Admitting: Adult Health

## 2021-05-13 ENCOUNTER — Other Ambulatory Visit: Payer: Self-pay

## 2021-05-13 ENCOUNTER — Ambulatory Visit (INDEPENDENT_AMBULATORY_CARE_PROVIDER_SITE_OTHER): Payer: PPO | Admitting: Adult Health

## 2021-05-13 VITALS — BP 130/76 | HR 93 | Temp 97.5°F | Wt 129.0 lb

## 2021-05-13 DIAGNOSIS — Z23 Encounter for immunization: Secondary | ICD-10-CM

## 2021-05-13 DIAGNOSIS — E785 Hyperlipidemia, unspecified: Secondary | ICD-10-CM | POA: Diagnosis not present

## 2021-05-13 DIAGNOSIS — M85831 Other specified disorders of bone density and structure, right forearm: Secondary | ICD-10-CM

## 2021-05-13 DIAGNOSIS — C8590 Non-Hodgkin lymphoma, unspecified, unspecified site: Secondary | ICD-10-CM | POA: Diagnosis not present

## 2021-05-13 DIAGNOSIS — K219 Gastro-esophageal reflux disease without esophagitis: Secondary | ICD-10-CM | POA: Diagnosis not present

## 2021-05-13 DIAGNOSIS — R296 Repeated falls: Secondary | ICD-10-CM

## 2021-05-13 DIAGNOSIS — Z6822 Body mass index (BMI) 22.0-22.9, adult: Secondary | ICD-10-CM

## 2021-05-13 DIAGNOSIS — G4700 Insomnia, unspecified: Secondary | ICD-10-CM | POA: Diagnosis not present

## 2021-05-13 DIAGNOSIS — E1122 Type 2 diabetes mellitus with diabetic chronic kidney disease: Secondary | ICD-10-CM

## 2021-05-13 DIAGNOSIS — F3341 Major depressive disorder, recurrent, in partial remission: Secondary | ICD-10-CM | POA: Diagnosis not present

## 2021-05-13 DIAGNOSIS — E1169 Type 2 diabetes mellitus with other specified complication: Secondary | ICD-10-CM | POA: Diagnosis not present

## 2021-05-13 DIAGNOSIS — I700 Atherosclerosis of aorta: Secondary | ICD-10-CM | POA: Diagnosis not present

## 2021-05-13 DIAGNOSIS — R634 Abnormal weight loss: Secondary | ICD-10-CM

## 2021-05-13 DIAGNOSIS — Z79899 Other long term (current) drug therapy: Secondary | ICD-10-CM | POA: Diagnosis not present

## 2021-05-13 DIAGNOSIS — Z0001 Encounter for general adult medical examination with abnormal findings: Secondary | ICD-10-CM

## 2021-05-13 DIAGNOSIS — R6889 Other general symptoms and signs: Secondary | ICD-10-CM | POA: Diagnosis not present

## 2021-05-13 DIAGNOSIS — M797 Fibromyalgia: Secondary | ICD-10-CM

## 2021-05-13 DIAGNOSIS — N182 Chronic kidney disease, stage 2 (mild): Secondary | ICD-10-CM | POA: Diagnosis not present

## 2021-05-13 DIAGNOSIS — K589 Irritable bowel syndrome without diarrhea: Secondary | ICD-10-CM

## 2021-05-13 DIAGNOSIS — E559 Vitamin D deficiency, unspecified: Secondary | ICD-10-CM

## 2021-05-13 DIAGNOSIS — R2681 Unsteadiness on feet: Secondary | ICD-10-CM

## 2021-05-13 DIAGNOSIS — M170 Bilateral primary osteoarthritis of knee: Secondary | ICD-10-CM | POA: Diagnosis not present

## 2021-05-13 DIAGNOSIS — I10 Essential (primary) hypertension: Secondary | ICD-10-CM

## 2021-05-13 DIAGNOSIS — Z1231 Encounter for screening mammogram for malignant neoplasm of breast: Secondary | ICD-10-CM

## 2021-05-13 DIAGNOSIS — M51369 Other intervertebral disc degeneration, lumbar region without mention of lumbar back pain or lower extremity pain: Secondary | ICD-10-CM

## 2021-05-13 DIAGNOSIS — M5136 Other intervertebral disc degeneration, lumbar region: Secondary | ICD-10-CM | POA: Diagnosis not present

## 2021-05-13 DIAGNOSIS — N3281 Overactive bladder: Secondary | ICD-10-CM

## 2021-05-13 DIAGNOSIS — Z1211 Encounter for screening for malignant neoplasm of colon: Secondary | ICD-10-CM

## 2021-05-13 DIAGNOSIS — Z0000 Encounter for general adult medical examination without abnormal findings: Secondary | ICD-10-CM

## 2021-05-13 MED ORDER — TRAZODONE HCL 50 MG PO TABS: ORAL_TABLET | ORAL | 0 refills | Status: DC

## 2021-05-13 NOTE — Progress Notes (Signed)
MEDICARE ANNUAL WELLNESS VISIT AND FOLLOW UP  Assessment:   Encounter for Medicare annual wellness exam Due annually  Health maintenance reviewed  She will follow up ophthalmology, Dr. Oletta Lamas, and schedule mammogram with dexa   Atherosclerosis of aorta [facility]) Per CXR 2019  Control blood pressure, cholesterol, glucose, increase exercise.   Essential hypertension - continue medications, DASH diet, exercise and monitor at home. Call if greater than 130/80.  -     CBC with Differential/Platelet -     CMP/GFR -     TSH  Type 2 diabetes mellitus with stage 2 chronic kidney disease, without long-term current use of insulin (HCC) -     Hemoglobin A1c Discussed general issues about diabetes pathophysiology and management., Educational material distributed., Suggested low cholesterol diet., Encouraged aerobic exercise., Discussed foot care., Reminded to get yearly retinal exam.  Mixed hyperlipidemia associated with T2DM ([place]) - continue medications, check lipids, decrease fatty foods, increase activity.  -     Lipid panel  CKD II assocaited with T2DM (HCC) Increase fluids, avoid NSAIDS, monitor sugars, will monitor  Degenerative disc disease, lumbar Followed by Dr. Estanislado Pandy   Primary osteoarthritis of both knees Followed by Dr. Estanislado Pandy  Non-Hodgkin's lymphoma, unspecified body region, unspecified non-Hodgkin lymphoma type ([place]) Monitor symptoms, in remission, released by Dr. Jana Short  Depression, major, recurrent, in partial remission ([place]) Continue prozac, formerly followed with psych Discussed risks with benzo, she reports taking 0.5 mg once a day She has been off for several days; encouraged she reduce use to rare PRN only if possible  Lifestyle discussed: diet/exerise, sleep hygiene, stress management, hydration  Insomnia Excess sedation with gabapentin 300 mg, mirtazepine 15 mg Discussed lower doses but patient prefers to try alternative agent Will try very low dose  trazodone - 25 mg, increase to 50 mg only if needed. Follow up if any SE.   Fibromyalgia syndrome Continue medications, follow up with Dr. Estanislado Pandy  Medication management -     Magnesium  Vitamin D deficiency Continue supplement  Gastroesophageal reflux disease, esophagitis presence not specified Continue PPI/H2 blocker, diet discussed  Irritable bowel syndrome, unspecified type  Recently fairly controlled; add soluble fiber if needed  Osteopenia Repeat DEXA 2021- ordered to schedule, continue Vit D and Ca, weight bearing exercises  OAB Wears pull ups  High risk for falls  Has completed in home PT this year, continues with daily exercises Encouraged regular use of walker Reviewed beer's list and high risk meds; d/c gabapentin and mirtazepine due to excess sedation with lack of adequate perceived benefit;  She has been off of xanax for 3 days; discussed risks and encouraged her to minimize use, use smallest dose possible.   BMI 22 Monitor; encourage heathy diet and regular exercise  Weight loss of 10% in 6 months Reviewed in detail; initial drop with hospitalization; slow trend down since then; initially she was working on intentional weight loss No other concerning sx Check TSH, A1C; reviewed cancer screening, update as due Encouraged high protein and healthy fats in diet;  Discussed possibly persuing CT chest/abd; discussed risks; she would like to hold off for now; she did have CT head/neck without lymphadenopathy noted on 12/10/2020 Debra Short will help monitor closely at home, sooner follow up if needed    Over 30 minutes of exam, counseling, chart review, and critical decision making was performed  Future Appointments  Date Time Provider [place]  09/23/2021  2:00 PM Liane Comber, NP [facility] None  11/02/2021  2:15 PM Newton Pigg,  RPH [facility] None  05/17/2022  4:00 PM Rabab Currington, Caryl Pina, NP [facility] None    Plan:   During the course of the visit  the patient was educated and counseled about appropriate screening and preventive services including:   Pneumococcal vaccine  Influenza vaccine Td vaccine Prevnar 13 Screening electrocardiogram Screening mammography Bone densitometry screening Colorectal cancer screening Diabetes screening Glaucoma screening Nutrition counseling  Advanced directives: given info/requested copies   Subjective:   Debra Short is a 80 y.o. female who presents for Medicare Annual Wellness Visit and 3 month follow up. She has Hyperlipidemia associated with type 2 diabetes mellitus ([place]); Essential hypertension; Gastroesophageal reflux disease; Vitamin D deficiency; IBS (irritable bowel syndrome); Type 2 diabetes mellitus with stage 2 chronic kidney disease, without long-term current use of insulin ([place]); Non Hodgkin's lymphoma ([place]); Medication management; Insomnia; CKD stage 2 due to type 2 diabetes mellitus ([place]); Degenerative disc disease, lumbar; Primary osteoarthritis of both knees; Fibromyalgia syndrome; Depression, major, recurrent, in partial remission ([place]); BMI 24.0-24.9, adult; OAB (overactive bladder); Osteopenia; Aortic atherosclerosis ([place]) by CXR in 2019; Nervous; Frequent falls; Unsteady gait; and Unintentional weight loss of 10% body weight within 6 months on their problem list. Non-hodgkin's was treated by Dr. Jana Short, in remission.  She is here with Debra Short here caregiver who checks on her 4 days a week, can call her or son (in [place]) if needed. She lives by herself. Debra Short helps with housework and driving when needed.   Recent hx/o falls - had a Subdural Hematoma on May 23 and was discharged home 2 days later and after several ER visits for falls,  she was finally held til transferred to [facility] on 11/16/2020  Then on 6/30 a the NH, she slipped in the shower sustaining a laceration of her posterior scalp requiring stapling.  Head & Cx CT scans found no acute pathology.   She was discharged  from NH back home on 12/11/2020. She did complete home PT for balance. She reports slowed cognition since fall. She uses walker intermittently, denies fall since returning home but has had near fall.   She has been s on prozac 40 mg daily for depression/anxiety for many years, PRN xanax, was seeing psych but hasn't followed up recently. She reports takes 1/4-1/2 tab PRN and estimates 1 month supply lasts 4 months.   She has been given gabapentin 300 mg, more recently mirtazepine 15 mg tabs for sleep and also concern with weight loss, stopped both, reports "hung over" and very sedated late into the next day. 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   Patient has a long standing history of fibromyalgia syndrome followed by Dr Estanislado Pandy and previously prescribed hydrocodone but this was d/c'd related to concerns with falling. She denies pain at this time.   IBS managed by lifestyle  BMI is Body mass index is 22.14 kg/m., she has been working on diet, exercise is limited. Has been doing strengthening and balance exercises since discharge from SNF, 5-10 min daily.  She is concerned about weight loss, initially was working on intentional weight loss, but down 31 lb down since fall in June.  She does report good appetite, eating 2-3 full meals daily. She is vegetarian but reports eats 2 full bags of walnuts/week.  Wt Readings from Last 3 Encounters:  05/13/21 129 lb (58.5 kg)  04/06/21 131 lb 9.6 oz (59.7 kg)  01/12/21 135 lb (61.2 kg)   Has aortic atherosclerosis per CXR 2019  Her blood pressure has been controlled at home, today their BP is BP: 130/76 Not on ACE/ARB due to low BPs She does not workout. She denies chest pain, shortness of breath, dizziness. She will occasionally get some walking in around the house or do stretches but is not formally exercising.    She is not currently on cholesterol  medication and denies myalgias. Her cholesterol is at goal. The cholesterol last visit was:   Lab Results  Component Value Date   CHOL 110 01/12/2021   HDL 46 (L) 01/12/2021   LDLCALC 39 01/12/2021   TRIG 175 (H) 01/12/2021   CHOLHDL 2.4 01/12/2021   She has been working on diet/exercise for T2DM well controlled on metformin, most recently back into normal range. She has been taking metformin takes 2-3 tabs daily depending on diet. She denies hypoglycemia, extremity paraesthesias, polydipsia, polyuria, blurry vision. She admits she does not check fasting glucose.  Lab Results  Component Value Date   HGBA1C 5.1 01/12/2021   CKD II associated with T2DM monitored at this office:  Lab Results  Component Value Date   GFRNONAA >60 11/12/2020   Patient is on Vitamin D supplement. Lab Results  Component Value Date   VD25OH 84 01/12/2021          Medication Review Current Outpatient Medications on File Prior to Visit  Medication Sig Dispense Refill   acetaminophen (TYLENOL) 650 MG CR tablet Take 650 mg by mouth See admin instructions. Take 650 mg by mouth at bedtime and an additional 650 mg during the day as needed for pain     ALPRAZolam (XANAX) 1 MG tablet TAKE 1/2-1 TABLET 2-3 TIMES PER DAY ONLY IF NEEDED FOR ANXIETY ATTACK & LIMIT TO 5 DAYS /WEEK TO AVOID ADDICTION & DEMENTIA 60 tablet 0   Cholecalciferol (VITAMIN D-3 PO) Take 1-2 capsules by mouth daily. 2000 iu per day     esomeprazole (NEXIUM) 40 MG capsule TAKE 1 CAPSULE DAILY TO PREVENT HEARTBURN & INDIGESTION 90 capsule 33   FLUoxetine (PROZAC) 40 MG capsule TAKE 1 CAPSULE DAILY FOR MOOD 90 capsule 3   fluticasone (FLONASE) 50 MCG/ACT nasal spray Place 1-2 sprays into both nostrils daily as needed for allergies or rhinitis. 48 mL 4   metFORMIN (GLUCOPHAGE-XR) 500 MG 24 hr tablet TAKE 2 TABLETS BY MOUTH 2 X /DAY WITH MEALS FOR DIABETES 360 tablet 1   Multiple Vitamins-Minerals (ICAPS) CAPS Take 1 capsule by mouth daily with  lunch.     Multiple Vitamins-Minerals (ONE-A-DAY PROACTIVE 65+) TABS Take 1 tablet by mouth daily with breakfast.     Propylene Glycol (SYSTANE BALANCE OP) Place 1 drop into both eyes 4 (four) times daily as needed (dry eyes).     sodium chloride (MURO 128) 5 % ophthalmic solution Place 1 drop into both eyes in the morning, at noon, and at bedtime.     No current facility-administered medications on file prior to visit.    Allergies: Allergies  Allergen Reactions   Effexor [Venlafaxine] Other (See Comments)    Not effective   Nortriptyline Other (See Comments)    Not effective   Other Other (See Comments)    Spicy foods cause heartburn   Paxil [Paroxetine Hcl] Other (See Comments)    Not effective   Robaxin [Methocarbamol] Itching   Tizanidine Itching   Zoloft [Sertraline Hcl] Other (See Comments)    Not effective  Current Problems (verified) has Hyperlipidemia associated with type 2 diabetes mellitus ([place]); Essential hypertension; Gastroesophageal reflux disease; Vitamin D deficiency; IBS (irritable bowel syndrome); Type 2 diabetes mellitus with stage 2 chronic kidney disease, without long-term current use of insulin ([place]); Non Hodgkin's lymphoma ([place]); Medication management; Insomnia; CKD stage 2 due to type 2 diabetes mellitus ([place]); Degenerative disc disease, lumbar; Primary osteoarthritis of both knees; Fibromyalgia syndrome; Depression, major, recurrent, in partial remission ([place]); BMI 24.0-24.9, adult; OAB (overactive bladder); Osteopenia; Aortic atherosclerosis ([place]) by CXR in 2019; Nervous; Frequent falls; Unsteady gait; and Unintentional weight loss of 10% body weight within 6 months on their problem list.  Screening Tests Immunization History  Administered Date(s) Administered   Influenza Split 04/10/2013, 03/23/2016   Influenza, High Dose Seasonal PF 03/24/2014, 02/27/2015, 05/13/2021   Influenza-Unspecified 03/27/2018   Moderna Sars-Covid-2 Vaccination 07/26/2019,  08/23/2019, 04/21/2020   Pneumococcal Conjugate-13 03/24/2014   Pneumococcal Polysaccharide-23 06/13/2008   Td 09/24/2012   Tdap 09/26/2014, 09/25/2020, 11/02/2020   Zoster, Live 02/23/2011    Preventative care: Last colonoscopy: 2013, diverticulosis, never polyps, Dr. Oletta Lamas, declines further due to age, wants to do cologuard Last mammogram:  10/2017 - overdue, ordered to schedule DEXA 10/2017 - T -1.4 forearm , ordered to schedule  PAP: remote, DONE   Prior vaccinations: TD or Tdap: 2016, 2022    Influenza: 05/2021 Pneumococcal: 2010 Prevnar13: 2015 Shingles/Zostavax: 2012 Covid 19: 2/2, 2021, moderna + 04/2020 planning nother booster  Names of Other Physician/Practitioners you currently use: 1.  [facility]- here for primary care 2. [facility], Dr. Valetta Close, eye doctor, 04/2021 3. Dr. Johnn Hai, dentist, last visit 2021, due to schedule   Patient Care Team: Unk [name], MD as PCP - General (Internal Medicine) Bo Merino, MD as Consulting Physician (Rheumatology) [name], MD (Inactive) as Consulting Physician (Gastroenterology) Allyn Kenner, MD (Dermatology) Rush Landmark, [facility] as Pharmacist (Pharmacist)  Surgical: She  has a past surgical history that includes ORIF ankle fracture (Left, 09/26/2014); Abdominal hysterectomy; Tonsillectomy; and ORIF ankle fracture (Left, 09/26/2014). Family Her family history includes Cancer in her brother, father, maternal grandfather, and mother. Social history  She reports that she has never smoked. She has never used smokeless tobacco. She reports that she does not drink alcohol and does not use drugs.  MEDICARE WELLNESS OBJECTIVES: Physical activity: Current Exercise Habits: Home exercise routine, Type of exercise: strength training/weights;stretching, Time (Minutes): 10, Frequency (Times/Week): 7, Weekly Exercise (Minutes/Week): 70, Intensity: Mild, Exercise limited by: None  identified Cardiac risk factors: Cardiac Risk Factors include: advanced age (>53men, >67 women);dyslipidemia;hypertension Depression/mood screen:   Depression screen [facility] 2/9 05/13/2021  Decreased Interest 0  Down, Depressed, Hopeless 0  PHQ - 2 Score 0  Altered sleeping 3  Tired, decreased energy 1  Change in appetite 0  Feeling bad or failure about yourself  0  Trouble concentrating 0  Moving slowly or fidgety/restless 0  Suicidal thoughts 0  PHQ-9 Score 4  Difficult doing work/chores -  Some recent data might be hidden    ADLs:  In your present state of health, do you have any difficulty performing the following activities: 05/13/2021 01/17/2021  Hearing? N N  Vision? N N  Difficulty concentrating or making decisions? Y N  Comment slower since head injury -  Walking or climbing stairs? Y N  Comment manages slowly, has walker, has removed throw rugs, has shower chair -  Dressing or bathing? N N  Doing errands, shopping? Y N  Preparing Food and eating ?  N -  Using the Toilet? N -  In the past six months, have you accidently leaked urine? N -  Do you have problems with loss of bowel control? N -  Managing your Medications? N -  Managing your Finances? N -  Housekeeping or managing your Housekeeping? N -  Comment sharon helps -  Some recent data might be hidden     Cognitive Testing  Alert? Yes  Normal Appearance?Yes  Oriented to person? Yes  Place? Yes   Time? Yes  Recall of three objects?  No, 1/3  Can perform simple calculations? Yes  Displays appropriate judgment?Yes  Can read the correct time from a watch face?Yes  EOL planning: Does Patient Have a Medical Advance Directive?: Yes Type of Advance Directive: Healthcare Power of Attorney, Living will Does patient want to make changes to medical advance directive?: No - Patient declined Copy of [place] in Chart?: Yes - validated most recent copy scanned in chart (See row information)   Objective:    Today's Vitals   05/13/21 1518  BP: 130/76  Pulse: 93  Temp: (!) 97.5 F (36.4 C)  SpO2: 97%  Weight: 129 lb (58.5 kg)    Body mass index is 22.14 kg/m. Wt Readings from Last 3 Encounters:  05/13/21 129 lb (58.5 kg)  04/06/21 131 lb 9.6 oz (59.7 kg)  01/12/21 135 lb (61.2 kg)    General appearance: alert, no distress, WD/WN,  female HEENT: normocephalic, sclerae anicteric, TMs pearly, nares patent, no discharge or erythema, pharynx normal. R upper external nontender nodular lump without discharge Oral cavity: MMM, no lesions Neck: supple, no lymphadenopathy, no thyromegaly, normal thyroid Heart: RRR, normal S1, S2, no murmurs Lungs: CTA bilaterally, no wheezes, rhonchi, or rales Abdomen: +bs, soft, non tender, non distended, no masses, no hepatomegaly, no splenomegaly Musculoskeletal: nontender, no swelling, no obvious deformity Extremities: no edema, no cyanosis, no clubbing Pulses: 2+ symmetric, upper and lower extremities, normal cap refill Neurological: alert, oriented x 3, CN2-12 intact, 4/5 strength throughput, sensation normal throughout, DTRs 2+ throughout, no cerebellar signs, gait slow and unsteady Psychiatric: normal affect, behavior normal, pleasant   Medicare Attestation I have personally reviewed: The patient's medical and social history Their use of alcohol, tobacco or illicit drugs Their current medications and supplements The patient's functional ability including ADLs,fall risks, home safety risks, cognitive, and hearing and visual impairment Diet and physical activities Evidence for depression or mood disorders  The patient's weight, height, BMI, and visual acuity have been recorded in the chart.  I have made referrals, counseling, and provided education to the patient based on review of the above and I have provided the patient with a written personalized care plan for preventive services.     Izora Ribas, NP   05/13/2021

## 2021-05-13 NOTE — Patient Instructions (Addendum)
  Recommend stopping gabapentin, mirtazepine, also hold xanax and limit as much as possible  Try low dose trazodone 25-50 mg 1 hour prior to bedtime for sleep If waking up feeling too tired, could try taking even earlier with supper    HOW TO SCHEDULE A MAMMOGRAM AND BONE DENSITY   [facility]  7 a.m.-6:30 p.m., Monday 7 a.m.-5 p.m., Tuesday-Friday Schedule an appointment by calling [phone].

## 2021-05-14 ENCOUNTER — Other Ambulatory Visit: Payer: Self-pay | Admitting: Adult Health

## 2021-05-14 DIAGNOSIS — N289 Disorder of kidney and ureter, unspecified: Secondary | ICD-10-CM

## 2021-05-14 LAB — HEMOGLOBIN A1C
Hgb A1c MFr Bld: 5.5 % of total Hgb (ref <5.7)
Mean Plasma Glucose: 111 mg/dL
eAG (mmol/L): 6.2 mmol/L

## 2021-05-14 LAB — CBC WITH DIFFERENTIAL/PLATELET
Absolute Monocytes: 792 cells/uL (ref 200–950)
Basophils Absolute: 61 cells/uL (ref 0–200)
Basophils Relative: 0.7 %
Eosinophils Absolute: 139 cells/uL (ref 15–500)
Eosinophils Relative: 1.6 %
HCT: 38.7 % (ref 35.0–45.0)
Hemoglobin: 12.6 g/dL (ref 11.7–15.5)
Lymphs Abs: 2610 cells/uL (ref 850–3900)
MCH: 30.1 pg (ref 27.0–33.0)
MCHC: 32.6 g/dL (ref 32.0–36.0)
MCV: 92.4 fL (ref 80.0–100.0)
MPV: 9.6 fL (ref 7.5–12.5)
Monocytes Relative: 9.1 %
Neutro Abs: 5098 cells/uL (ref 1500–7800)
Neutrophils Relative %: 58.6 %
Platelets: 340 10*3/uL (ref 140–400)
RBC: 4.19 10*6/uL (ref 3.80–5.10)
RDW: 13.4 % (ref 11.0–15.0)
Total Lymphocyte: 30 %
WBC: 8.7 10*3/uL (ref 3.8–10.8)

## 2021-05-14 LAB — LIPID PANEL
Cholesterol: 136 mg/dL (ref <200)
HDL: 53 mg/dL (ref >=50)
LDL Cholesterol (Calc): 62 mg/dL (calc)
Non-HDL Cholesterol (Calc): 83 mg/dL (calc) (ref <130)
Total CHOL/HDL Ratio: 2.6 (calc) (ref <5.0)
Triglycerides: 129 mg/dL (ref <150)

## 2021-05-14 LAB — COMPLETE METABOLIC PANEL WITH GFR
AG Ratio: 1.8 (calc) (ref 1.0–2.5)
ALT: 13 U/L (ref 6–29)
AST: 16 U/L (ref 10–35)
Albumin: 4.4 g/dL (ref 3.6–5.1)
Alkaline phosphatase (APISO): 62 U/L (ref 37–153)
BUN/Creatinine Ratio: 18 (calc) (ref 6–22)
BUN: 17 mg/dL (ref 7–25)
CO2: 31 mmol/L (ref 20–32)
Calcium: 10 mg/dL (ref 8.6–10.4)
Chloride: 102 mmol/L (ref 98–110)
Creat: 0.96 mg/dL — ABNORMAL HIGH (ref 0.60–0.95)
Globulin: 2.5 g/dL (calc) (ref 1.9–3.7)
Glucose, Bld: 87 mg/dL (ref 65–139)
Potassium: 4.2 mmol/L (ref 3.5–5.3)
Sodium: 143 mmol/L (ref 135–146)
Total Bilirubin: 1 mg/dL (ref 0.2–1.2)
Total Protein: 6.9 g/dL (ref 6.1–8.1)
eGFR: 59 mL/min/{1.73_m2} — ABNORMAL LOW (ref >=60)

## 2021-05-14 LAB — TSH: TSH: 1.23 mIU/L (ref 0.40–4.50)

## 2021-05-14 LAB — MAGNESIUM: Magnesium: 1.7 mg/dL (ref 1.5–2.5)

## 2021-06-08 ENCOUNTER — Other Ambulatory Visit: Payer: Self-pay | Admitting: Adult Health

## 2021-06-08 ENCOUNTER — Ambulatory Visit (INDEPENDENT_AMBULATORY_CARE_PROVIDER_SITE_OTHER): Payer: PPO

## 2021-06-08 ENCOUNTER — Other Ambulatory Visit: Payer: Self-pay

## 2021-06-08 DIAGNOSIS — N289 Disorder of kidney and ureter, unspecified: Secondary | ICD-10-CM | POA: Diagnosis not present

## 2021-06-08 NOTE — Progress Notes (Signed)
Patient presents to the office for a nurse visit to have labs done. Rechecking Kidney Function. Patient states that she drinks 3-4 bottles of water plus Gatorade and coffee a day.

## 2021-06-09 ENCOUNTER — Other Ambulatory Visit: Payer: Self-pay | Admitting: Adult Health

## 2021-06-09 DIAGNOSIS — N289 Disorder of kidney and ureter, unspecified: Secondary | ICD-10-CM | POA: Insufficient documentation

## 2021-06-09 LAB — URINALYSIS, ROUTINE W REFLEX MICROSCOPIC
Bacteria, UA: NONE SEEN /HPF
Bilirubin Urine: NEGATIVE
Glucose, UA: NEGATIVE
Hgb urine dipstick: NEGATIVE
Nitrite: NEGATIVE
RBC / HPF: NONE SEEN /HPF (ref 0–2)
Specific Gravity, Urine: 1.023 (ref 1.001–1.035)
Yeast: NONE SEEN /HPF
pH: 6.5 (ref 5.0–8.0)

## 2021-06-09 LAB — BASIC METABOLIC PANEL WITH GFR
BUN/Creatinine Ratio: 22 (calc) (ref 6–22)
BUN: 22 mg/dL (ref 7–25)
CO2: 35 mmol/L — ABNORMAL HIGH (ref 20–32)
Calcium: 9.7 mg/dL (ref 8.6–10.4)
Chloride: 103 mmol/L (ref 98–110)
Creat: 1.01 mg/dL — ABNORMAL HIGH (ref 0.60–0.95)
Glucose, Bld: 135 mg/dL — ABNORMAL HIGH (ref 65–99)
Potassium: 5.4 mmol/L — ABNORMAL HIGH (ref 3.5–5.3)
Sodium: 145 mmol/L (ref 135–146)
eGFR: 56 mL/min/{1.73_m2} — ABNORMAL LOW (ref >=60)

## 2021-06-09 LAB — MICROSCOPIC MESSAGE

## 2021-06-10 ENCOUNTER — Other Ambulatory Visit: Payer: PPO

## 2021-06-15 ENCOUNTER — Ambulatory Visit
Admission: RE | Admit: 2021-06-15 | Discharge: 2021-06-15 | Disposition: A | Discharge status: Home / Self Care | Payer: PPO | Source: Ambulatory Visit | Attending: Adult Health | Admitting: Adult Health

## 2021-06-15 DIAGNOSIS — R944 Abnormal results of kidney function studies: Secondary | ICD-10-CM | POA: Diagnosis not present

## 2021-06-15 DIAGNOSIS — N289 Disorder of kidney and ureter, unspecified: Secondary | ICD-10-CM

## 2021-06-15 DIAGNOSIS — N2889 Other specified disorders of kidney and ureter: Secondary | ICD-10-CM | POA: Diagnosis not present

## 2021-06-16 ENCOUNTER — Other Ambulatory Visit: Payer: Self-pay | Admitting: Adult Health

## 2021-06-16 DIAGNOSIS — N289 Disorder of kidney and ureter, unspecified: Secondary | ICD-10-CM

## 2021-07-05 ENCOUNTER — Emergency Department (HOSPITAL_COMMUNITY)
Admission: EM | Admit: 2021-07-05 | Discharge: 2021-07-05 | Disposition: A | Discharge status: Home / Self Care | Payer: PPO | Attending: Emergency Medicine | Admitting: Emergency Medicine

## 2021-07-05 ENCOUNTER — Emergency Department (HOSPITAL_COMMUNITY): Payer: PPO

## 2021-07-05 ENCOUNTER — Other Ambulatory Visit: Payer: Self-pay

## 2021-07-05 ENCOUNTER — Encounter (HOSPITAL_COMMUNITY): Payer: Self-pay | Admitting: *Deleted

## 2021-07-05 DIAGNOSIS — I951 Orthostatic hypotension: Secondary | ICD-10-CM | POA: Insufficient documentation

## 2021-07-05 DIAGNOSIS — Z043 Encounter for examination and observation following other accident: Secondary | ICD-10-CM | POA: Diagnosis not present

## 2021-07-05 DIAGNOSIS — G4489 Other headache syndrome: Secondary | ICD-10-CM | POA: Diagnosis not present

## 2021-07-05 DIAGNOSIS — Z79899 Other long term (current) drug therapy: Secondary | ICD-10-CM | POA: Insufficient documentation

## 2021-07-05 DIAGNOSIS — E875 Hyperkalemia: Secondary | ICD-10-CM | POA: Diagnosis not present

## 2021-07-05 DIAGNOSIS — M25519 Pain in unspecified shoulder: Secondary | ICD-10-CM | POA: Diagnosis not present

## 2021-07-05 DIAGNOSIS — S299XXA Unspecified injury of thorax, initial encounter: Secondary | ICD-10-CM | POA: Diagnosis not present

## 2021-07-05 DIAGNOSIS — T148XXA Other injury of unspecified body region, initial encounter: Secondary | ICD-10-CM

## 2021-07-05 DIAGNOSIS — E119 Type 2 diabetes mellitus without complications: Secondary | ICD-10-CM | POA: Insufficient documentation

## 2021-07-05 DIAGNOSIS — R0602 Shortness of breath: Secondary | ICD-10-CM | POA: Insufficient documentation

## 2021-07-05 DIAGNOSIS — R918 Other nonspecific abnormal finding of lung field: Secondary | ICD-10-CM | POA: Diagnosis not present

## 2021-07-05 DIAGNOSIS — R58 Hemorrhage, not elsewhere classified: Secondary | ICD-10-CM | POA: Diagnosis not present

## 2021-07-05 DIAGNOSIS — Z7984 Long term (current) use of oral hypoglycemic drugs: Secondary | ICD-10-CM | POA: Diagnosis not present

## 2021-07-05 DIAGNOSIS — M4312 Spondylolisthesis, cervical region: Secondary | ICD-10-CM | POA: Diagnosis not present

## 2021-07-05 DIAGNOSIS — I700 Atherosclerosis of aorta: Secondary | ICD-10-CM | POA: Diagnosis not present

## 2021-07-05 DIAGNOSIS — I10 Essential (primary) hypertension: Secondary | ICD-10-CM | POA: Diagnosis not present

## 2021-07-05 DIAGNOSIS — W19XXXA Unspecified fall, initial encounter: Secondary | ICD-10-CM

## 2021-07-05 DIAGNOSIS — S0990XA Unspecified injury of head, initial encounter: Secondary | ICD-10-CM | POA: Diagnosis not present

## 2021-07-05 DIAGNOSIS — W1839XA Other fall on same level, initial encounter: Secondary | ICD-10-CM | POA: Insufficient documentation

## 2021-07-05 DIAGNOSIS — S0003XA Contusion of scalp, initial encounter: Secondary | ICD-10-CM | POA: Diagnosis not present

## 2021-07-05 DIAGNOSIS — R55 Syncope and collapse: Secondary | ICD-10-CM | POA: Diagnosis not present

## 2021-07-05 DIAGNOSIS — M19012 Primary osteoarthritis, left shoulder: Secondary | ICD-10-CM | POA: Diagnosis not present

## 2021-07-05 DIAGNOSIS — S199XXA Unspecified injury of neck, initial encounter: Secondary | ICD-10-CM | POA: Diagnosis not present

## 2021-07-05 LAB — CBC WITH DIFFERENTIAL/PLATELET
Abs Immature Granulocytes: 0.03 10*3/uL (ref 0.00–0.07)
Basophils Absolute: 0 10*3/uL (ref 0.0–0.1)
Basophils Relative: 1 %
Eosinophils Absolute: 0.2 10*3/uL (ref 0.0–0.5)
Eosinophils Relative: 3 %
HCT: 37.6 % (ref 36.0–46.0)
Hemoglobin: 11.6 g/dL — ABNORMAL LOW (ref 12.0–15.0)
Immature Granulocytes: 0 %
Lymphocytes Relative: 20 %
Lymphs Abs: 1.7 10*3/uL (ref 0.7–4.0)
MCH: 30.2 pg (ref 26.0–34.0)
MCHC: 30.9 g/dL (ref 30.0–36.0)
MCV: 97.9 fL (ref 80.0–100.0)
Monocytes Absolute: 0.9 10*3/uL (ref 0.1–1.0)
Monocytes Relative: 11 %
Neutro Abs: 5.6 10*3/uL (ref 1.7–7.7)
Neutrophils Relative %: 65 %
Platelets: 239 10*3/uL (ref 150–400)
RBC: 3.84 MIL/uL — ABNORMAL LOW (ref 3.87–5.11)
RDW: 13 % (ref 11.5–15.5)
WBC: 8.5 10*3/uL (ref 4.0–10.5)
nRBC: 0 % (ref 0.0–0.2)

## 2021-07-05 LAB — TROPONIN I (HIGH SENSITIVITY)
Troponin I (High Sensitivity): 5 ng/L (ref <18)
Troponin I (High Sensitivity): 6 ng/L (ref <18)

## 2021-07-05 LAB — COMPREHENSIVE METABOLIC PANEL
ALT: 13 U/L (ref 0–44)
AST: 23 U/L (ref 15–41)
Albumin: 3.8 g/dL (ref 3.5–5.0)
Alkaline Phosphatase: 53 U/L (ref 38–126)
Anion gap: 10 (ref 5–15)
BUN: 18 mg/dL (ref 8–23)
CO2: 31 mmol/L (ref 22–32)
Calcium: 10 mg/dL (ref 8.9–10.3)
Chloride: 100 mmol/L (ref 98–111)
Creatinine, Ser: 0.84 mg/dL (ref 0.44–1.00)
GFR, Estimated: >60 mL/min (ref >60)
Glucose, Bld: 111 mg/dL — ABNORMAL HIGH (ref 70–99)
Potassium: 5.2 mmol/L — ABNORMAL HIGH (ref 3.5–5.1)
Sodium: 141 mmol/L (ref 135–145)
Total Bilirubin: 1 mg/dL (ref 0.3–1.2)
Total Protein: 6.6 g/dL (ref 6.5–8.1)

## 2021-07-05 LAB — BRAIN NATRIURETIC PEPTIDE: B Natriuretic Peptide: 41.2 pg/mL (ref 0.0–100.0)

## 2021-07-05 NOTE — ED Provider Notes (Signed)
[facility] EMERGENCY DEPARTMENT Provider Note   CSN: [id number] Arrival date & time: 07/05/21  0932     History  Chief Complaint  Patient presents with   Debra Short    Debra Short is a 81 y.o. female.   Fall   81 year old female with medical history significant for HLD, IBS, lymphoma, fibromyalgia, DM 2, subdural hematoma who presents emergency department after a fall.  She got up in the middle of the night to use the bathroom when she became lightheaded and subsequently passed out and lost consciousness.  She is not on anticoagulation.  She sustained a hematoma to the back of her head with a possible abrasion.  She has previously fallen and struck the back of her head and required staples.  Her last tetanus was updated in the past year.  She endorses "pain all over" with no specific area of focus to her pain.  Home Medications Prior to Admission medications   Medication Sig Start Date End Date Taking? Authorizing Provider  acetaminophen (TYLENOL) 650 MG CR tablet Take 650 mg by mouth See admin instructions. Take 650 mg by mouth at bedtime and an additional 650 mg during the day as needed for pain    [provider]  ALPRAZolam (XANAX) 1 MG tablet TAKE 1/2-1 TABLET 2-3 TIMES PER DAY ONLY IF NEEDED FOR ANXIETY ATTACK & LIMIT TO 5 DAYS /WEEK TO AVOID ADDICTION & DEMENTIA 04/27/21   Liane Comber, NP  Cholecalciferol (VITAMIN D-3 PO) Take 1-2 capsules by mouth daily. 2000 iu per day    [provider]  esomeprazole (NEXIUM) 40 MG capsule TAKE 1 CAPSULE DAILY TO PREVENT HEARTBURN & INDIGESTION 03/25/21   Magda Bernheim, NP  FLUoxetine (PROZAC) 40 MG capsule TAKE 1 CAPSULE DAILY FOR MOOD 04/05/21   Magda Bernheim, NP  fluticasone (FLONASE) 50 MCG/ACT nasal spray Place 1-2 sprays into both nostrils daily as needed for allergies or rhinitis. 09/14/20   Liane Comber, NP  metFORMIN (GLUCOPHAGE-XR) 500 MG 24 hr tablet TAKE 2 TABLETS BY MOUTH 2 X /DAY WITH  MEALS FOR DIABETES 03/25/21   Magda Bernheim, NP  Multiple Vitamins-Minerals (ICAPS) CAPS Take 1 capsule by mouth daily with lunch.    [provider]  Multiple Vitamins-Minerals (ONE-A-DAY PROACTIVE 65+) TABS Take 1 tablet by mouth daily with breakfast.    [provider]  Propylene Glycol (SYSTANE BALANCE OP) Place 1 drop into both eyes 4 (four) times daily as needed (dry eyes).    [provider]  sodium chloride (MURO 128) 5 % ophthalmic solution Place 1 drop into both eyes in the morning, at noon, and at bedtime.    [provider]  traZODone (DESYREL) 50 MG tablet TAKE 1/2-1 TABLET BY MOUTH FOR SLEEP. 06/08/21   Liane Comber, NP      Allergies    Effexor [venlafaxine], Nortriptyline, Other, Paxil [paroxetine hcl], Robaxin [methocarbamol], Tizanidine, and Zoloft [sertraline hcl]    Review of Systems   Review of Systems  All other systems reviewed and are negative.  Physical Exam Updated Vital Signs BP (!) 164/81    Pulse 83    Temp 97.8 F (36.6 C) (Oral)    Resp 17    Ht 5\' 5"  (1.651 m)    Wt 59.9 kg    SpO2 98%    BMI 21.97 kg/m  Physical Exam Vitals and nursing note reviewed.  Constitutional:      General: She is not in acute distress.  Appearance: She is well-developed.     Comments: GCS 15, ABC intact  HENT:     Head: Normocephalic.     Comments: Hematoma to the occiput with a small abrasion present, hemostatic Eyes:     Extraocular Movements: Extraocular movements intact.     Conjunctiva/sclera: Conjunctivae normal.     Pupils: Pupils are equal, round, and reactive to light.  Neck:     Comments: No midline tenderness to palpation of the cervical spine.  C-collar in place Cardiovascular:     Rate and Rhythm: Normal rate and regular rhythm.     Heart sounds: No murmur heard. Pulmonary:     Effort: Pulmonary effort is normal. No respiratory distress.     Breath sounds: Normal breath sounds.  Chest:     Comments: Left clavicular  deformity present. Chest wall stable and nontender to AP and lateral compression. Abdominal:     Palpations: Abdomen is soft.     Tenderness: There is no abdominal tenderness.  Musculoskeletal:     Cervical back: Neck supple.     Comments: No midline tenderness to palpation of the thoracic or lumbar spine.  Extremities atraumatic with intact range of motion with the exception of left shoulder tenderness.  Skin:    General: Skin is warm and dry.  Neurological:     Mental Status: She is alert.     Comments: Cranial nerves II through XII grossly intact.  Moving all 4 extremities spontaneously.  Sensation grossly intact all 4 extremities    ED Results / Procedures / Treatments   Labs (all labs ordered are listed, but only abnormal results are displayed) Labs Reviewed  COMPREHENSIVE METABOLIC PANEL - Abnormal; Notable for the following components:      Result Value   Potassium 5.2 (*)    Glucose, Bld 111 (*)    All other components within normal limits  CBC WITH DIFFERENTIAL/PLATELET - Abnormal; Notable for the following components:   RBC 3.84 (*)    Hemoglobin 11.6 (*)    All other components within normal limits  BRAIN NATRIURETIC PEPTIDE  TROPONIN I (HIGH SENSITIVITY)  TROPONIN I (HIGH SENSITIVITY)    EKG Sinus rhythm, vent rate 88BPM Probable left atrial enlargement Borderline left axis deviation Probable anteroseptal infarct, old  Radiology DG Chest 1 View  Result Date: 07/05/2021 CLINICAL DATA:  81 year old female with history of trauma from a fall. EXAM: CHEST  1 VIEW COMPARISON:  Chest x-ray 03/02/2018. FINDINGS: Lung volumes are normal. No consolidative airspace disease. No pleural effusions. No pneumothorax. No pulmonary nodule or mass noted. Pulmonary vasculature and the cardiomediastinal silhouette are within normal limits. Atherosclerosis in the thoracic aorta. 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 IMPRESSION: 1. No  radiographic evidence of acute cardiopulmonary disease. 2. Aortic atherosclerosis. Electronically Signed   By: Vinnie Langton M.D.   On: 07/05/2021 11:29   DG Clavicle Left  Result Date: 07/05/2021 CLINICAL DATA:  Fall. Patient reports history of left clavicle fracture. EXAM: LEFT CLAVICLE - 2+ VIEWS; LEFT SHOULDER - 2+ VIEW COMPARISON:  Left shoulder radiographs 03/02/2018 FINDINGS: Redemonstration of now chronic left mid clavicle fracture. There is slightly more than 1 bone width inferior displacement of the lateral component with respect to the medial component, similar to prior. Approximally 1 cm transverse gap between the bones, which is increased from the mild transverse bone overlap seen previously. Mild to moderate left acromioclavicular joint space narrowing and peripheral osteophytosis degenerative change. Mild-to-moderate glenohumeral joint space  narrowing and peripheral degenerative osteophytosis. Calcification is seen within the aortic arch. No acute fracture or dislocation. IMPRESSION:: IMPRESSION: 1. Remote nonunited left clavicle midshaft fracture. 2. No acute fracture is seen. 3. 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 Electronically Signed   By: Yvonne Kendall M.D.   On: 07/05/2021 11:42   CT HEAD WO CONTRAST (5MM)  Result Date: 07/05/2021 CLINICAL DATA:  Neck trauma EXAM: CT HEAD WITHOUT CONTRAST CT CERVICAL SPINE WITHOUT CONTRAST TECHNIQUE: Multidetector CT imaging of the head and cervical spine was performed following the standard protocol without intravenous contrast. Multiplanar CT image reconstructions of the cervical spine were also generated. RADIATION DOSE REDUCTION: This exam was performed according to the departmental dose-optimization program which includes automated exposure control, adjustment of the mA and/or kV according to patient size and/or use of iterative reconstruction technique. COMPARISON:  None. CT head and cervical spine dated December 10, 2020 FINDINGS: CT HEAD  FINDINGS Brain: No evidence of acute infarction, hemorrhage, hydrocephalus, extra-axial collection or mass lesion/mass effect. Vascular: No hyperdense vessel or unexpected calcification. Skull: Normal. Negative for fracture or focal lesion. Sinuses/Orbits: No acute finding. Other: None. CT CERVICAL SPINE FINDINGS Alignment: Anterolisthesis of C3 on C4 C4 on C5 and C5 on C6, unchanged compared to prior exam and likely degenerative. Skull base and vertebrae: No acute fracture. No primary bone lesion or focal pathologic process. Soft tissues and spinal canal: No prevertebral fluid or swelling. No visible canal hematoma. Disc levels:  Multilevel moderate degenerative disc disease. Upper chest: Linear opacities of the medial left lung apex, similar to prior exam and likely due to scarring. Other: None. IMPRESSION: 1. No acute intracranial abnormality. 2. No CT evidence of acute cervical spine injury. Electronically Signed   By: Yetta Glassman M.D.   On: 07/05/2021 12:18   CT Cervical Spine Wo Contrast  Result Date: 07/05/2021 CLINICAL DATA:  Neck trauma EXAM: CT HEAD WITHOUT CONTRAST CT CERVICAL SPINE WITHOUT CONTRAST TECHNIQUE: Multidetector CT imaging of the head and cervical spine was performed following the standard protocol without intravenous contrast. Multiplanar CT image reconstructions of the cervical spine were also generated. RADIATION DOSE REDUCTION: This exam was performed according to the departmental dose-optimization program which includes automated exposure control, adjustment of the mA and/or kV according to patient size and/or use of iterative reconstruction technique. COMPARISON:  None. CT head and cervical spine dated December 10, 2020 FINDINGS: CT HEAD FINDINGS Brain: No evidence of acute infarction, hemorrhage, hydrocephalus, extra-axial collection or mass lesion/mass effect. Vascular: No hyperdense vessel or unexpected calcification. Skull: Normal. Negative for fracture or focal lesion.  Sinuses/Orbits: No acute finding. Other: None. CT CERVICAL SPINE FINDINGS Alignment: Anterolisthesis of C3 on C4 C4 on C5 and C5 on C6, unchanged compared to prior exam and likely degenerative. Skull base and vertebrae: No acute fracture. No primary bone lesion or focal pathologic process. Soft tissues and spinal canal: No prevertebral fluid or swelling. No visible canal hematoma. Disc levels:  Multilevel moderate degenerative disc disease. Upper chest: Linear opacities of the medial left lung apex, similar to prior exam and likely due to scarring. Other: None. IMPRESSION: 1. No acute intracranial abnormality. 2. No CT evidence of acute cervical spine injury. Electronically Signed   By: Yetta Glassman M.D.   On: 07/05/2021 12:18   DG Shoulder Left  Result Date: 07/05/2021 CLINICAL DATA:  Fall. Patient reports history of left clavicle fracture. EXAM: LEFT CLAVICLE - 2+ VIEWS; LEFT SHOULDER - 2+ VIEW COMPARISON:  Left shoulder radiographs 03/02/2018 FINDINGS: Redemonstration of now chronic  left mid clavicle fracture. There is slightly more than 1 bone width inferior displacement of the lateral component with respect to the medial component, similar to prior. Approximally 1 cm transverse gap between the bones, which is increased from the mild transverse bone overlap seen previously. Mild to moderate left acromioclavicular joint space narrowing and peripheral osteophytosis degenerative change. Mild-to-moderate glenohumeral joint space narrowing and peripheral degenerative osteophytosis. Calcification is seen within the aortic arch. No acute fracture or dislocation. IMPRESSION:: IMPRESSION: 1. Remote nonunited left clavicle midshaft fracture. 2. No acute fracture is seen. 3. 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 Electronically Signed   By: Yvonne Kendall M.D.   On: 07/05/2021 11:42    Procedures Procedures    Medications Ordered in ED Medications - No data to display  ED Course/ Medical Decision  Making/ A&P                           Medical Decision Making Amount and/or Complexity of Data Reviewed Labs: ordered. Radiology: ordered.   81 year old female with medical history significant for HLD, IBS, lymphoma, fibromyalgia, DM 2, subdural hematoma who presents emergency department after a fall.  She got up in the middle of the night to use the bathroom when she became lightheaded and subsequently passed out and lost consciousness.  She is not on anticoagulation.  She sustained a hematoma to the back of her head with a possible abrasion.  She has previously fallen and struck the back of her head and required staples.  Her last tetanus was updated in the past year.  She endorses "pain all over" with no specific area of focus to her pain.  On arrival, the patient was afebrile, hypertensive BP 202/95, not tachycardic or tachypneic, saturating 98% on room air.  Patient presenting after syncopal episode and subsequent fall with head trauma.  She is not on anticoagulation.  Her tetanus is up-to-date.  She sustained a hematoma and abrasion to her posterior occiput on physical exam.  She also has a left clavicular deformity on exam with some tenderness of the left shoulder.  Work-up initiated for traumatic treat to include CT of the head, cervical spine, chest x-ray, left clavicle x-ray, left shoulder x-ray.  Her tetanus is up-to-date.  Her presentation is most consistent with orthostatic hypotension as she lost consciousness immediately upon standing up to ambulate. Orthostatics were obtained after the patient received an IVF bolus in the ED. Low suspicion for cardiogenic etiology of the patient's syncopal episode.   Work-up initiated to include laboratory work-up with a normal troponin and BMP, CBC without a leukocytosis, stable anemia to 11.6, CMP with mild hyperkalemia 5.2, otherwise unremarkable.  Trauma imaging revealed a CT head and cervical spine with no acute intracranial abnormality and no  evidence of cervical spine injury.  X-ray imaging of the left clavicle was performed which revealed a chronic left clavicle fracture.  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  No evidence of fracture of the shoulder on x-ray imaging.  No evidence of rib fractures on x-ray imaging of the chest.  EKG revealed normal sinus rhythm, ventricular rate 88, PR 176, QRS 86, QTc 465.  Patient is overall well-appearing and had no episodes on cardiac telemetry that were concerning while in the ED.  Her presentation consistent with most likely orthostatic hypotension resulting in syncope and head trauma.  Her imaging revealed no evidence of intracranial abnormality.  Her posterior scalp hematoma was cleaned and an abrasion was noted with no need  for immediate repair.  After discussion of the risk and benefits of admission for observation on cardiac telemetry versus discharge, feel that the patient is stable for discharge home.    DC Instructions: Your exam/testing today was overall reassuring.  Your laboratory work-up was unremarkable.  Your CT imaging and x-ray imaging was only significant for a chronic left clavicular fracture which is already known.  No acute abnormalities were noted.  Your scalp hematoma has a small abrasion about it that does not require suturing or staple repair.  Recommend applying bacitracin ointment daily but otherwise it should heal by secondary intention.   Final Clinical Impression(s) / ED Diagnoses Final diagnoses:  Fall, initial encounter  Contusion of scalp, initial encounter  Abrasion  Orthostatic hypotension  Syncope, unspecified syncope type    Rx / DC Orders ED Discharge Orders     None         Regan Lemming, MD 07/06/21 1310

## 2021-07-05 NOTE — Discharge Instructions (Addendum)
You were evaluated in the Emergency Department and after careful evaluation, we did not find any emergent condition requiring admission or further testing in the hospital.  Your exam/testing today was overall reassuring.  Your laboratory work-up was unremarkable.  Your CT imaging and x-ray imaging was only significant for a chronic left clavicular fracture which is already known.  No acute abnormalities were noted.  Your scalp hematoma has a small abrasion about it that does not require suturing or staple repair.  Recommend applying bacitracin ointment daily but otherwise it should heal by secondary intention.   Please return to the Emergency Department if you experience any worsening of your condition.  Thank you for allowing [place] to be a part of your care.

## 2021-07-05 NOTE — ED Triage Notes (Signed)
Patient presents to ed via [facility] states she got up to go the bathroom sometime this am and fell, unsure what time she fell, thinks she loss cons. Hematoma back of her head. Co bilateral shoulder pain. Patient was able to call her caregiver, moves all ext. X 4.

## 2021-07-08 ENCOUNTER — Telehealth: Payer: Self-pay

## 2021-07-08 NOTE — Telephone Encounter (Signed)
Called patient for OAB review call. Patient did not pick up. Unable to leave voicemail d/t it being full. Will f/u and call patient in one week.  Total time spent: 2 minutes [name], [facility]

## 2021-07-12 DIAGNOSIS — R531 Weakness: Secondary | ICD-10-CM | POA: Diagnosis not present

## 2021-07-12 DIAGNOSIS — R002 Palpitations: Secondary | ICD-10-CM | POA: Diagnosis not present

## 2021-07-14 ENCOUNTER — Telehealth: Payer: Self-pay

## 2021-07-14 NOTE — Telephone Encounter (Signed)
Called pt. for OAB review call. Unable to reach pt. Unable to LVM d/t VM not being set up. Patient has no other phone numbers on file. Will try calling again in a week.   Total time spent:15 minutes [name], [facility]

## 2021-07-19 ENCOUNTER — Telehealth: Payer: Self-pay

## 2021-07-19 NOTE — Telephone Encounter (Signed)
Called pt. for OAB review call. Unable to reach patient. Will try reaching out again in one week.  Total time spent: 2 minutes [name], [facility]

## 2021-08-04 ENCOUNTER — Telehealth: Payer: Self-pay

## 2021-08-04 NOTE — Telephone Encounter (Signed)
LM-08/04/21-Called patient for ED d/c review call.Unable to reach patient. Will try calling again in one week.  Total time spent: 2 Minutes [name], [facility]

## 2021-08-10 DIAGNOSIS — E1122 Type 2 diabetes mellitus with diabetic chronic kidney disease: Secondary | ICD-10-CM | POA: Diagnosis not present

## 2021-08-10 DIAGNOSIS — I10 Essential (primary) hypertension: Secondary | ICD-10-CM | POA: Diagnosis not present

## 2021-08-10 NOTE — Telephone Encounter (Signed)
LM-08/10/21-Called patient and went over ED discharge review call.  Total time spent: [place], [facility]

## 2021-08-17 NOTE — Telephone Encounter (Signed)
LM-08/17/21-Called patient to go over fall risk review call.  ? ?Total time spent: 2 Minutes ?[name], [facility] ?

## 2021-08-24 ENCOUNTER — Other Ambulatory Visit: Payer: Self-pay | Admitting: Adult Health

## 2021-08-24 DIAGNOSIS — N390 Urinary tract infection, site not specified: Secondary | ICD-10-CM | POA: Diagnosis not present

## 2021-08-24 DIAGNOSIS — R7989 Other specified abnormal findings of blood chemistry: Secondary | ICD-10-CM | POA: Diagnosis not present

## 2021-08-24 DIAGNOSIS — F411 Generalized anxiety disorder: Secondary | ICD-10-CM

## 2021-08-24 DIAGNOSIS — N1831 Chronic kidney disease, stage 3a: Secondary | ICD-10-CM | POA: Diagnosis not present

## 2021-09-07 ENCOUNTER — Telehealth: Payer: Self-pay

## 2021-09-07 NOTE — Telephone Encounter (Signed)
ERROR

## 2021-09-17 ENCOUNTER — Encounter: Payer: PPO | Admitting: Internal Medicine

## 2021-09-23 ENCOUNTER — Encounter: Payer: PPO | Admitting: Adult Health

## 2021-10-26 ENCOUNTER — Telehealth: Payer: Self-pay

## 2021-10-26 NOTE — Telephone Encounter (Signed)
LM-10/26/21-Chart Prep started, Reviewing OV, Consults, Hospital visits, Labs and medication changes. Chart prep completed. ? ?Total time spent: 25 min. ?

## 2021-10-27 DIAGNOSIS — Z7984 Long term (current) use of oral hypoglycemic drugs: Secondary | ICD-10-CM | POA: Diagnosis not present

## 2021-10-27 DIAGNOSIS — N1831 Chronic kidney disease, stage 3a: Secondary | ICD-10-CM | POA: Diagnosis not present

## 2021-10-27 DIAGNOSIS — E1122 Type 2 diabetes mellitus with diabetic chronic kidney disease: Secondary | ICD-10-CM | POA: Diagnosis not present

## 2021-10-27 DIAGNOSIS — F320 Major depressive disorder, single episode, mild: Secondary | ICD-10-CM | POA: Diagnosis not present

## 2021-10-28 NOTE — Telephone Encounter (Signed)
LM-10/28/21-Calling pt. To confirm CP visit for 11/02/20 at 2:15PM via phone visit and to complete precall questions. Precall questions completed and confirmed upcoming CP visit. During precall questions pt. Mentioned that she was feeling depressed so I initiated PHQ-9 assessment w/ pts. Permission. Pt. Informed me that she is interested in seeing a new psychiatrist to help with her depression.Asked pt. If she would like me to assist in finding a psychiatrist who is taking new clients in her area, and pt. Gave consent. Pt. Aware that I will call her once I find a psychiatrist in her area.. Pt. Verbalized understanding and agreed.  Total time spent: 50 min.  Anxiety and/or Depression Review (HC) Chart Review What recent interventions have been made by any provider to improve the patient's conditions in the last 3 months?: 05/13/21-Ashley Corbett, NP- Pt. presented for AWV. STARTED  traZODone HCl 50 MG 1/2-1 tablet for sleep. STOPPED  Gabapentin 300 MG Take  2 capsules  2 x /day  for Pain (Patient Preference)  Mirtazapine 15 MG Take  1 tablet  1 hour  before Bedtime for Appetite & Sleep (Side effect (s)) 08/24/21-[facility], Ryan B (Nephrology)- No information available. 07/05/21-[facility]- Pt. presented after a fall which resulted in pt. passing out and lost consciousness. No medication changes noted. Any recent hospitalizations or ED visits since last visit with CPP?: Yes Brief Summary (including Medication and/or Diagnosis changes):: 07/05/21-[facility] ED- Pt. presented after a fall which resulted in pt. passing out and lost consciousness. No medication changes noted.  Adherence Review Adherence rates for STAR metric medications:  Metformin '500mg'$ -07/02/21 90DS Adherence rates for medications indicated for disease state being reviewed:  Alprazolam '1mg'$ - 08/24/21 30DS  Fluoxetine '40mg'$ - 10/13/21 90DS Does the patient have >5 day gap between last estimated fill dates for any of the  above medications?: Yes Reasons for medication gaps: Alprazolam is PRN  Disease State Questions Able to connect with the Patient?: Yes Patient has (Multiselect): Depression Depression (PHQ-9): Over the last 2 weeks, how often have you been bothered by the following problems?: Done Little interest or pleasure in doing things: 0 (Not at all) Feeling down, depressed, or hopeless: 2 (More than half of the days) Trouble falling or staying asleep, or sleeping too much: 3 (Nearly every day) Feeling tired or having little energy: 3 (Nearly every day) Poor appetite or overeating: 2 (More than half of the days) Feeling bad about yourself or that you are a failure or have let yourself or your family down: 0 (Not at all) Trouble concentrating on things, such as reading the newspaper or watching television: 2 (More than half of the days) Moving or speaking so slowly that other people could have noticed? Or the opposite - being so fidgety or restless that you have been moving around a to more than usual: 3 (Nearly every day) Thoughts that you would be better off dead or of hurting yourself in some way: 0 (Not at all) Total PHQ-9 Score: 15 Depression Severity: Moderately Severe Depression (Score: 15-19) In your opinion, how do you feel your anxiety / depression symptoms have been controlled over the past 3 months?: Stable / stayed the same Thank patient for answering questions. Your score shows "severity of anxiety / depression". If either score is 10 or above, I advised patient that I will pass this along to the clinical lead to review and discuss further with patient.: Done

## 2021-10-29 NOTE — Telephone Encounter (Signed)
LM-10/29/21-Pt. requested to start seeing a psychiatrist again as her old one has retired. Pt. Is having a hard time coping with the recent loss of her husband and admitted her depression is affecting her daily. Researched and found two different practices for pt. Options for psychiatrists in her area. Calling pt. To inform her of the providers/practices I found in her area.Closest practice near pt. Is [facility] [phone] at [address], [place] [phone] at [place]. [address], [place]. Both practices listed does in person visits and telehealth visits and accepts pts.insurance. Unable to reach patient. [place].  Total time spent: 20 min.

## 2021-11-01 NOTE — Telephone Encounter (Signed)
LM-11/01/21-Calling pt. To inform her of resources I found for a psychiatrist.  Calling pt. To inform her of the providers/practices I found in her area.Closest practice near pt. Is [facility] [phone] at [address] [place] [phone] at [place]. [address], [place]. Both practices listed does in person visits and telehealth visits and accepts pts.insurance. Unable to reach patient. Will try calling pt. Again at another time.  Total time spent: 2 min.

## 2021-11-02 ENCOUNTER — Ambulatory Visit: Payer: PPO | Admitting: Pharmacy Technician

## 2021-11-02 DIAGNOSIS — R296 Repeated falls: Secondary | ICD-10-CM

## 2021-11-02 DIAGNOSIS — N3281 Overactive bladder: Secondary | ICD-10-CM

## 2021-11-02 DIAGNOSIS — G4700 Insomnia, unspecified: Secondary | ICD-10-CM

## 2021-11-02 DIAGNOSIS — Z79899 Other long term (current) drug therapy: Secondary | ICD-10-CM

## 2021-11-02 DIAGNOSIS — R2681 Unsteadiness on feet: Secondary | ICD-10-CM

## 2021-11-03 NOTE — Progress Notes (Signed)
Follow Up Pharmacist Visit   Debra Short, Debra Short [id number] 39 years, Female  DOB: 04-06-1941  M: [phone] Care Team: Levy Pupa __________________________________________________ Patient's Chronic Conditions: Hypertension (HTN), Gastroesophageal Reflux Disease (GERD), Diabetes (DM), Chronic Kidney Disease (CKD), Hyperlipidemia/Dyslipidemia (HLD), Osteoarthritis, Osteopenia or Osteoporosis, Overactive Bladder (OAB), Insomnia, Depression, Other List Other Conditions (separated by comma): Vitamin D deficiency, IBS, Degenerative disc disease, Non-Hodgkin's lymphoma, Fibromyalgia, Nervous, Unsteady gait, Deterioration in renal function . Summary for PCP:  1. Patient would like to discuss in person with PCP OAB treatment options and insomnia issues. Oxybutynin and Solfenacin are Tier 2 and Myrbetriq is a Tier 3 drug on [employer] formulary. 2. She has not started Mirtazapine and wants to discuss with PCP. 3. Patient no longer has a sitter helping her with all needs. I am concerned that she may have issues getting food, getting transportation if needed, and getting basic needs met. She states the sitter was stopped 2 weeks ago due to financial strain as it was self-pay. She states she wants to remain at home and is trying to be as independent as possible. She has a son but "he does not do anything, because he doesn't see the need to help." 4. No recent falls per patient. . Doctor and Hospital Visits Were there PCP Visits since last visit with the Pharmacist?: Yes Visit #1: 05/13/21-Ashley Corbett, NP- Pt. presented for AWV. STARTED  traZODone HCl 50 MG 1/2-1 tablet for sleep.  STOPPED  Gabapentin 300 MG Take  2 capsules  2 x /day  for Pain (Patient Preference) Mirtazapine 15 MG Take  1 tablet  1 hour  before Bedtime for Appetite & Sleep (Side effect (s))  Were there Specialist Visits since last visit with the Pharmacist?: Yes Visit #1: 08/24/21-[facility], Meredith Leeds  (Nephrology)- No information available. Was there a Hospital Visit in last 30 days?: No Were there other Hospital Visits since last visit with the Pharmacist?: Yes Visit #1: 07/05/21-[facility] ED- Pt. presented after a fall which resulted in pt. passing out and lost consciousness. No medication changes noted. . Are there any Medication adherence gaps (beyond 5 days past due)?: Yes Details:  Fluoxetine 3m- 04/05/21 & 07/15/21 (>5 days gap) [facility] Esomeprazole 455m 12/20/20 & 03/25/21 (>5 days gap) [facility] Metformin 50057m0/13/22 & 07/02/21  (>5 days gap) [facility] Trazodone 21m81m2/1/22 & 06/08/21  (>5 days gap) 30/[facility]  Medication adherence rates for the STAR rating drugs:  Metformin 500mg12m0/23 90DS  List Patient's current Care Gaps: Need Eye Exam Documented in EHR or by Claim . Engagement Notes MorgaVanetta Shawl5/16/2023 08:56 AM HC Chart/ CP prep: 25 min. .  Care Gaps  COVID-19 Vaccine Diabetic Foot Exam Ophthalmology exam Urine Microalbumin Zoster Vaccines .  Pre-Call Questions (HC) [facility] you able to connect with Patient: Yes Confirmed appointment date/time with patient/caregiver?: Yes Date/time of the appointment: 11/02/21 at 2:15PM Visit type: Phone Patient/Caregiver instructed to bring medications to appointment: Yes What, if any, problems do you have getting your medications from the pharmacy?: Financial barriers What is your top health concern to discuss at your upcoming visit?:  Pt. stated "When I go to sleep, sometimes i have a difficult time staying asleep because of urinating all night." Pt. wears pads at night  but prefers to use the bathroom. Pt. informed me that she feels weak if she doesn't rest well at night.  Asked pt. if she is currently taking any medication  for her OAB and  pt. informed me that she stopped taking Myrbetriq around 1 year ago. Pt. informed me that she was prescribed Soliefevan for her bladder but pt. is currently  not taking because of the cost. Pt. stated it costs her $40-50 every 3 months to get RX filled.  Pt. also wanted me to inform you she does not take her gabapentin as prescribed due to brain fog. Pt. admitted to only taking one at night. Pt. is supposed to take 4 daily. . Subjective Information Current BP: 164/81 Current HR: 83 taken on: 07/05/2021 Weight: 59.9kg BMI: 21.97 Last GFR: >60 taken on: 07/05/2021 Visit Completed on: 11/02/2021 Why did the patient present?: CCM Follow up What does the patient do during the day?: Trying to be as independent as posisble Lifestyle habits such as diet and exercise?: Trying to walk with walker in driveway, working on ways to increase appetite Alcohol, tobacco, and illicit drug usage?: None Factors that may affect medication adherence?: Transportation, Lack of understanding / insight of condition Is Patient using [facility] pharmacy?: No [facility] Current Rx insurance plan: HTA Are meds synced by current pharmacy?: No Are meds delivered by current pharmacy?: No - delivery not available Would patient benefit from direct intervention of clinical lead in dispensing process to optimize clinical outcomes?: Yes Are [facility] pharmacy services available where patient lives?: Yes Is patient disadvantaged to use [facility]?: No [facility] services reviewed with patient and patient wishes to change pharmacy?: No Select reason patient declined to change pharmacies: Patient preference Does patient experience delays in picking up medications due to transportation concerns (getting to pharmacy)?: No Any additional demeanor/mood notes?: Very sweet lady presenting via phone for CCM follow up. No complaints today during visit other than OAB symptoms. She states she has an office visit with Dr. Melford Aase 11/11/21 and plans to discuss options then.  SDOH: Accountable Health Communities Health-Related Social Needs Screening  Tool ([email or web address]) SDOH questions were documented and reviewed (EMR or [employer]) within the past 3 months?: No What is your living situation today? (ref #1): I have a steady place to live Think about the place you live. Do you have problems with any of the following? (ref #2): None of the above Within the past 12 months, you worried that your food would run out before you got money to buy more (ref #3): Sometimes true Within the past 12 months, the food you bought just didn't last and you didn't have money to get more (ref #4): Sometimes true In the past 12 months, has lack of reliable transportation kept you from medical appointments, meetings, work or from getting things needed for daily living? (ref #5): Yes In the past 12 months, has the electric, gas, oil, or water company threatened to shut off services in your home? (ref #6): No How often does anyone, including family and friends, physically hurt you? (ref #7): Never (1) How often does anyone, including family and friends, insult or talk down to you? (ref #8): Never (1) How often does anyone, including friends and family, threaten you with harm? (ref #9): Never (1) How often does anyone, including family and friends, scream or curse at you? (ref #10): Never (1) . Hypertension (HTN) Discussed with patient today?: No Drug: None Assessment: Query Appropriateness . Hyperlipidemia/Dyslipidemia (HLD) Last Lipid panel on: 05/13/2021 TC (Goal<200): 136 LDL: 62 HDL (Goal>40): 53 TG (Goal<150): 129 ASCVD 10-year risk?is:: N/A due to Age > 37 Discussed with patient today?: No  Drug: None Assessment: Query Appropriateness . Diabetes (DM) Current A1C: 5.5 taken on: 05/13/2021 Type: Pre-Diabetes/Impaired Fasting Glucose Discussed with patient today?: No Drug: Metformin XR 1058m BID Assessment: Appropriate, Effective, Safe, Accessible . Depression Previous PHQ-9 Score:  4 Discussed with patient today?: No . GERD Assessment Discussed with patient today?: No . Chronic kidney disease (CKD) Previous GFR: 56 taken on: 06/08/2021 The current microalbumin ratio is: 10 Discussed with patient today?: No . Osteopenia or Osteoporosis Current T-score: -1.7 taken on: 11/08/2017 Current Vitamin D 25-OH: 84 taken on: 01/12/2021 Discussed with patient today?: No . Overactive Bladder (OAB) Discussed with patient today?: Yes Patient is experiencing follow symptoms: Urgency, Frequency, Nocturia How often are you waking at night to urinate?: 2-3 We discussed: Timed voiding, Changes in diet to manage symptoms, such as limiting alcohol, caffeine, and carbonated beverages, also known as bladder irritants., Limiting fluid intake for 2-3 hours prior to bedtime Assessment:: Uncontrolled Drug: None Additional Info: Patient states she has tried [employer]and Myrbetriq in the past but were unaffordable. She is interested in restarting therapy and wants to discuss with Dr. MMelford Aasenext week. Oxybutynin and Solfenacin are Tier 2 drugs and Myrbetriq is Tier 3. Pharmacist Follow up: PCP follow up . Osteoarthritis Discussed with patient today?: No . Insomnia Discussed with patient today?: Yes Patient has following issues with sleeping: Trouble falling asleep, Trouble staying asleep, Trouble falling back to sleep after waking We discussed:: Avoiding naps during the day to help sleep longer at night, Ensuring room is cool and dark when trying to sleep, Avoiding screens (TV, phone, tablets) prior to bedtime, Establishing a nighttime routine including a consistent bedtime, Avoiding large meals close to bedtime Assessment:: Uncontrolled Additional Info: Patient states nocturia and worry keep her up at night. She was prescribed Remeron but has not started taking it. She was worried about the effects. Pharmacist Follow up: Will discuss with PCP . General Disease Assessment Discussed  with patient today?: No . Tobacco Use Disorder Discussed with patient today?: No . Exercise, Diet and Non-Drug Coordination Needs Additional exercise counseling points. We discussed: incorporating flexibility, balance, and strength training exercises Additional diet counseling points. We discussed: aiming to consume at least 8 cups of water day Discussed Non-Drug Care Coordination Needs: Yes Does Patient have Medication financial barriers?: No . Engagement Notes BMarda Stalkeron 11/02/2021 03:12 PM CPP Prep: 825m CPP OV: 1963mCPP Doc: 56m8mPP Care Plan: 3min29mClinical Summary Patient Risk: Moderate Next CCM Follow Up: CCS Transfer Next AWV: 05/17/22 Next PCP Visit: 11/11/21  AveryMarda StalkerrmD Clinical Pharmacist AveryNaida [email or web address] .care [phone]

## 2021-11-09 NOTE — Telephone Encounter (Signed)
LM-11/09/21-Calling pt. To inform her of resources I found for a psychiatrist.  Calling pt. To inform her of the providers/practices I found in her area.Closest practice near pt. Is [facility] [phone] at [address], [place] [phone] at [place]. [address], [place]. Both practices listed does in person visits and telehealth visits and accepts pts.insurance. Unable to reach pt. [place] X2.  Total time spent: 2 min.

## 2021-11-10 DIAGNOSIS — I10 Essential (primary) hypertension: Secondary | ICD-10-CM | POA: Diagnosis not present

## 2021-11-10 DIAGNOSIS — E1122 Type 2 diabetes mellitus with diabetic chronic kidney disease: Secondary | ICD-10-CM | POA: Diagnosis not present

## 2021-11-10 NOTE — Telephone Encounter (Signed)
LM-11/10/21-Calling pt. To inform her of resources I found for a psychiatrist. 3rd attempt reaching pt. Unsuccessful. LVM X3.  Total time spent: 2 min.

## 2021-11-11 ENCOUNTER — Encounter: Payer: Self-pay | Admitting: Adult Health

## 2021-11-11 ENCOUNTER — Ambulatory Visit (INDEPENDENT_AMBULATORY_CARE_PROVIDER_SITE_OTHER): Payer: PPO | Admitting: Adult Health

## 2021-11-11 VITALS — BP 120/68 | HR 85 | Temp 97.3°F | Ht 65.0 in | Wt 137.6 lb

## 2021-11-11 DIAGNOSIS — Z0000 Encounter for general adult medical examination without abnormal findings: Secondary | ICD-10-CM | POA: Diagnosis not present

## 2021-11-11 DIAGNOSIS — Z1329 Encounter for screening for other suspected endocrine disorder: Secondary | ICD-10-CM | POA: Diagnosis not present

## 2021-11-11 DIAGNOSIS — Z6822 Body mass index (BMI) 22.0-22.9, adult: Secondary | ICD-10-CM

## 2021-11-11 DIAGNOSIS — N1831 Chronic kidney disease, stage 3a: Secondary | ICD-10-CM

## 2021-11-11 DIAGNOSIS — F3341 Major depressive disorder, recurrent, in partial remission: Secondary | ICD-10-CM

## 2021-11-11 DIAGNOSIS — E785 Hyperlipidemia, unspecified: Secondary | ICD-10-CM | POA: Diagnosis not present

## 2021-11-11 DIAGNOSIS — Z79899 Other long term (current) drug therapy: Secondary | ICD-10-CM | POA: Diagnosis not present

## 2021-11-11 DIAGNOSIS — E1169 Type 2 diabetes mellitus with other specified complication: Secondary | ICD-10-CM | POA: Diagnosis not present

## 2021-11-11 DIAGNOSIS — E1122 Type 2 diabetes mellitus with diabetic chronic kidney disease: Secondary | ICD-10-CM

## 2021-11-11 DIAGNOSIS — M85831 Other specified disorders of bone density and structure, right forearm: Secondary | ICD-10-CM

## 2021-11-11 DIAGNOSIS — Z136 Encounter for screening for cardiovascular disorders: Secondary | ICD-10-CM

## 2021-11-11 DIAGNOSIS — I10 Essential (primary) hypertension: Secondary | ICD-10-CM

## 2021-11-11 DIAGNOSIS — C8590 Non-Hodgkin lymphoma, unspecified, unspecified site: Secondary | ICD-10-CM

## 2021-11-11 DIAGNOSIS — E559 Vitamin D deficiency, unspecified: Secondary | ICD-10-CM | POA: Diagnosis not present

## 2021-11-11 DIAGNOSIS — I700 Atherosclerosis of aorta: Secondary | ICD-10-CM

## 2021-11-11 DIAGNOSIS — N183 Chronic kidney disease, stage 3 unspecified: Secondary | ICD-10-CM | POA: Diagnosis not present

## 2021-11-11 DIAGNOSIS — K219 Gastro-esophageal reflux disease without esophagitis: Secondary | ICD-10-CM

## 2021-11-11 DIAGNOSIS — Z0001 Encounter for general adult medical examination with abnormal findings: Secondary | ICD-10-CM | POA: Diagnosis not present

## 2021-11-11 MED ORDER — SOLIFENACIN SUCCINATE 10 MG PO TABS: ORAL_TABLET | ORAL | 3 refills | Status: DC

## 2021-11-11 NOTE — Patient Instructions (Addendum)
Debra Short , Thank you for taking time to come for your Annual Wellness Visit. I appreciate your ongoing commitment to your health goals. Please review the following plan we discussed and let me know if I can assist you in the future.   These are the goals we discussed:  Goals      Exercise 5x per week (15-20 min per time)        This is a list of the screening recommended for you and due dates:  Health Maintenance  Topic Date Due   Zoster (Shingles) Vaccine (1 of 2) Never done   DEXA scan (bone density measurement)  11/09/2019   Eye exam for diabetics  08/11/2021   Urine Protein Check  09/16/2021   Hemoglobin A1C  11/11/2021   Flu Shot  01/11/2022   Complete foot exam   11/12/2022   Tetanus Vaccine  11/03/2030   Pneumonia Vaccine  Completed   COVID-19 Vaccine  Completed   HPV Vaccine  Aged Out    HOW TO SCHEDULE A MAMMOGRAM/ BONE DENSITY  [facility] [place] [facility]  7 a.m.-6:30 p.m., Monday 7 a.m.-5 p.m., Tuesday-Friday Schedule an appointment by calling [phone].       Zoster Vaccine, Recombinant injection What is this medication? ZOSTER VACCINE (ZOS ter vak SEEN) is a vaccine used to reduce the risk of getting shingles. This vaccine is not used to treat shingles or nerve pain from shingles. This medicine may be used for other purposes; ask your health care provider or pharmacist if you have questions. COMMON BRAND NAME(S): [facility] What should I tell my care team before I take this medication? They need to know if you have any of these conditions: cancer immune system problems an unusual or allergic reaction to Zoster vaccine, other medications, foods, dyes, or preservatives pregnant or trying to get pregnant breast-feeding How should I use this medication? This vaccine is injected into a muscle. It is given by a health care provider. A copy of Vaccine Information Statements will be given before each vaccination. Be sure to read this  information carefully each time. This sheet may change often. Talk to your health care provider about the use of this vaccine in children. This vaccine is not approved for use in children. Overdosage: If you think you have taken too much of this medicine contact a poison control center or emergency room at once. NOTE: This medicine is only for you. Do not share this medicine with others. What if I miss a dose? Keep appointments for follow-up (booster) doses. It is important not to miss your dose. Call your health care provider if you are unable to keep an appointment. What may interact with this medication? medicines that suppress your immune system medicines to treat cancer steroid medicines like prednisone or cortisone This list may not describe all possible interactions. Give your health care provider a list of all the medicines, herbs, non-prescription drugs, or dietary supplements you use. Also tell them if you smoke, drink alcohol, or use illegal drugs. Some items may interact with your medicine. What should I watch for while using this medication? Visit your health care provider regularly. This vaccine, like all vaccines, may not fully protect everyone. What side effects may I notice from receiving this medication? Side effects that you should report to your doctor or health care professional as soon as possible: allergic reactions (skin rash, itching or hives; swelling of the face, lips, or tongue) trouble breathing Side effects that usually  do not require medical attention (report these to your doctor or health care professional if they continue or are bothersome): chills headache fever nausea pain, redness, or irritation at site where injected tiredness vomiting This list may not describe all possible side effects. Call your doctor for medical advice about side effects. You may report side effects to FDA at 1-800-FDA-1088. Where should I keep my medication? This vaccine is only  given by a health care provider. It will not be stored at home. NOTE: This sheet is a summary. It may not cover all possible information. If you have questions about this medicine, talk to your doctor, pharmacist, or health care provider.  2023 Elsevier/Gold Standard (2021-04-30 00:00:00)

## 2021-11-11 NOTE — Progress Notes (Signed)
Encounter for Annual Physical Exam   Assessment:   Encounter for Annual Physical Exam with abnormal findings Due annually  Health Maintenance reviewed Healthy lifestyle reviewed and goals set  She will follow up ophthalmology, Dr. Oletta Lamas,   Dental Plans to reach out to established audiology, can call back for referral if needed.  schedule mammogram with dexa   Atherosclerosis of aorta ([place]) - Per CXR 2019  Control blood pressure, cholesterol, glucose, increase exercise.   Essential hypertension - continue medications, DASH diet, exercise and monitor at home. Call if greater than 130/80.  -     CBC with Differential/Platelet -     CMP/GFR -     TSH  Type 2 diabetes mellitus with stage 3 chronic kidney disease, without long-term current use of insulin ([place]) Discussed general issues about diabetes pathophysiology and management., Educational material distributed., Suggested low cholesterol diet., Encouraged aerobic exercise., Discussed foot care., Reminded to get yearly retinal exam. -     Hemoglobin A1c  Mixed hyperlipidemia associated with T2DM ([place]) - at LDL goal <70 off of medication   - check lipids, decrease fatty foods, increase activity.  -     Lipid panel  CKD III assocaited with T2DM ([facility]) Increase fluids, avoid NSAIDS, monitor sugars, will monitor  Degenerative disc disease, lumbar Followed by Dr. Estanislado Pandy   Primary osteoarthritis of both knees Followed by Dr. Estanislado Pandy  Non-Hodgkin's lymphoma, unspecified body region, unspecified non-Hodgkin lymphoma type ([place]) Monitor symptoms, in remission, released by Dr. Jana Hakim  Fibromyalgia syndrome Continue medications, follow up with Dr. Estanislado Pandy  Medication management -     Magnesium  Vitamin D deficiency Continue supplement  Gastroesophageal reflux disease, esophagitis presence not specified Continue PPI/H2 blocker, diet discussed  Irritable bowel syndrome, unspecified type  Recently fairly controlled;  add soluble fiber if needed  Osteopenia Repeat DEXA 2021- DEXA pending - patient states intends to schedule continue Vit D and Ca, weight bearing exercises  OAB Wears pull ups, vesicare refilled per patient request  Depression, major, recurrent, in partial remission ([place]) Continue prozac, formerly followed with psych Discussed risks with benzo, she reports taking 0.5 mg once a day She has been off for several days; encouraged she reduce use to rare PRN only if possible  Lifestyle discussed: diet/exerise, sleep hygiene, stress management, hydration  BMI 22  Monitor; encourage heathy diet and regular exercise  Insomnia Attempt to taper off of xanax  Will try very low dose trazodone - 25 mg, increase to 50 mg only if needed. Follow up if any SE.   High risk for falls  Has completed in home PT this year, continues with daily exercises Encouraged regular use of walker Reviewed beer's list and high risk meds;  Continue to reduce xanax use, try low dose trazodone -   Orders Placed This Encounter  Procedures   CBC with Differential/Platelet   COMPLETE METABOLIC PANEL WITH GFR   Magnesium   Lipid panel   TSH   Hemoglobin A1c   VITAMIN D 25 Hydroxy (Vit-D Deficiency, Fractures)   Microalbumin / creatinine urine ratio   Urinalysis, Routine w reflex microscopic   EKG 12-Lead   HM DIABETES FOOT EXAM     Over 30 minutes of exam, counseling, chart review, and critical decision making was performed  Future Appointments  Date Time Provider [place]  02/15/2022  2:30 PM Darrol Jump, NP [facility] None  05/17/2022  2:30 PM Magda Bernheim, NP [facility] None  11/14/2022  2:00 PM Darrol Jump, NP [facility]  None    Plan:   During the course of the visit the patient was educated and counseled about appropriate screening and preventive services including:   Pneumococcal vaccine  Influenza vaccine Td vaccine Prevnar 13 Screening electrocardiogram Screening  mammography Bone densitometry screening Colorectal cancer screening Diabetes screening Glaucoma screening Nutrition counseling  Advanced directives: given info/requested copies   Subjective:   Debra Short is a 81 y.o. female who presents for CPE and 3 month follow up. She has Hyperlipidemia associated with type 2 diabetes mellitus ([place]); Essential hypertension; Gastroesophageal reflux disease; Vitamin D deficiency; IBS (irritable bowel syndrome); Type 2 diabetes mellitus ([place]); Non Hodgkin's lymphoma ([place]); Medication management; Insomnia; CKD stage 3 due to type 2 diabetes mellitus ([place]); Degenerative disc disease, lumbar; Primary osteoarthritis of both knees; Fibromyalgia syndrome; Depression, major, recurrent, in partial remission ([place]); BMI 22.0-22.9, adult; OAB (overactive bladder); Osteopenia; Aortic atherosclerosis ([place]) by CXR in 2019; Nervous; Frequent falls; and Unsteady gait on their problem list.Non-hodgkin's was treated by Dr. Jana Hakim, in remission.   She livers by herself, can call her son if needed for help. She feels is currently managing without help in her home. She does drive short distances and to Dr's office only if needed. Grocery and [facility] are very close by.   Recent hx/o falls - several ED visits in the last year and had a Subdural Hematoma on Nov 02 2020. She did complete home PT for balance. She has been using walker intermittently, reports did have a fall this past year - tried to reach for her walker and it slipped out of reach and toppled over. She has been working on balance exercises at home, starting a walking program, declined PT referral at this time.   She has been on prozac 40 mg daily for depression/anxiety for many years, PRN xanax, was seeing psych but hasn't followed up recently.  She is prescribed xanax 1 mg tabs, reports takes 1/2 tab PRN at HS. Trazodone was prescribed last visit but admits never tried.   She does note bladder wakes her up, has seen  uro and tried several meds, vesicare was helping, requests script be resent.   Patient has a long standing history of fibromyalgia syndrome followed by Dr Estanislado Pandy and previously prescribed hydrocodone but this was d/c'd related to concerns with falling. She denies pain at this time.   IBS managed by lifestyle  BMI is Body mass index is 22.9 kg/m., she has been working on diet, exercise is limited.  Has been doing strengthening and balance exercises, 5-10 min daily.  She does report good appetite, eating 2-3 full meals daily. She is vegetarian reports eats 2 full bags of walnuts/week.  Wt Readings from Last 3 Encounters:  11/11/21 137 lb 9.6 oz (62.4 kg)  07/05/21 132 lb (59.9 kg)  06/08/21 133 lb (60.3 kg)   Has aortic atherosclerosis per CXR 2019  Her blood pressure has been controlled at home, today their BP is BP: 120/68 Not on ACE/ARB due to low BPs She does not workout. She denies chest pain, shortness of breath, dizziness. She will occasionally get some walking in around the house or do stretches but is not formally exercising.    She is not currently on cholesterol medication and denies myalgias. Her cholesterol is at goal LDL <70. The cholesterol last visit was:   Lab Results  Component Value Date   CHOL 136 05/13/2021   HDL 53 05/13/2021   LDLCALC 62 05/13/2021   TRIG 129 05/13/2021  CHOLHDL 2.6 05/13/2021   She has been working on diet/exercise for T2DM well controlled on metformin, most recently back into normal range. She has been taking metformin takes 2-3 tabs daily depending on diet. She denies hypoglycemia, extremity paraesthesias, polydipsia, polyuria, blurry vision. She admits she does not check fasting glucose.  Lab Results  Component Value Date   HGBA1C 5.5 05/13/2021   CKD newly progressed to stage III range, associated with T2DM monitored at this office:  Lab Results  Component Value Date   EGFR 56 (L) 06/08/2021   EGFR 59 (L) 05/13/2021   EGFR 81  01/12/2021   Lab Results  Component Value Date   MICRALBCREAT 16 09/16/2020   Patient is on Vitamin D supplement. Lab Results  Component Value Date   VD25OH 84 01/12/2021         Medication Review Current Outpatient Medications on File Prior to Visit  Medication Sig Dispense Refill   acetaminophen (TYLENOL) 650 MG CR tablet Take 650 mg by mouth See admin instructions. Take 650 mg by mouth at bedtime and an additional 650 mg during the day as needed for pain     ALPRAZolam (XANAX) 1 MG tablet TAKE 1/2-1 TABLET 2-3 TIMES PER DAY ONLY IF NEEDED FOR ANXIETY ATTACK & LIMIT TO 5 DAYS /WEEK TO AVOID ADDICTION & DEMENTIA 60 tablet 0   Cholecalciferol (VITAMIN D-3 PO) Take 1-2 capsules by mouth daily. 2000 iu per day     esomeprazole (NEXIUM) 40 MG capsule TAKE 1 CAPSULE DAILY TO PREVENT HEARTBURN & INDIGESTION 90 capsule 33   FLUoxetine (PROZAC) 40 MG capsule TAKE 1 CAPSULE DAILY FOR MOOD 90 capsule 3   fluticasone (FLONASE) 50 MCG/ACT nasal spray Place 1-2 sprays into both nostrils daily as needed for allergies or rhinitis. 48 mL 4   metFORMIN (GLUCOPHAGE-XR) 500 MG 24 hr tablet TAKE 2 TABLETS BY MOUTH 2 X /DAY WITH MEALS FOR DIABETES 360 tablet 1   Multiple Vitamins-Minerals (ICAPS) CAPS Take 1 capsule by mouth daily with lunch.     Multiple Vitamins-Minerals (ONE-A-DAY PROACTIVE 65+) TABS Take 1 tablet by mouth daily with breakfast.     Propylene Glycol (SYSTANE BALANCE OP) Place 1 drop into both eyes 4 (four) times daily as needed (dry eyes).     sodium chloride (MURO 128) 5 % ophthalmic solution Place 1 drop into both eyes in the morning, at noon, and at bedtime.     traZODone (DESYREL) 50 MG tablet TAKE 1/2-1 TABLET BY MOUTH FOR SLEEP. (Patient not taking: Reported on 11/11/2021) 90 tablet 1   No current facility-administered medications on file prior to visit.    Allergies: Allergies  Allergen Reactions   Effexor [Venlafaxine] Other (See Comments)    Not effective   Nortriptyline  Other (See Comments)    Not effective   Other Other (See Comments)    Spicy foods cause heartburn   Paxil [Paroxetine Hcl] Other (See Comments)    Not effective   Robaxin [Methocarbamol] Itching   Tizanidine Itching   Zoloft [Sertraline Hcl] Other (See Comments)    Not effective    Current Problems (verified) has Hyperlipidemia associated with type 2 diabetes mellitus ([place]); Essential hypertension; Gastroesophageal reflux disease; Vitamin D deficiency; IBS (irritable bowel syndrome); Type 2 diabetes mellitus ([place]); Non Hodgkin's lymphoma ([place]); Medication management; Insomnia; CKD stage 3 due to type 2 diabetes mellitus ([place]); Degenerative disc disease, lumbar; Primary osteoarthritis of both knees; Fibromyalgia syndrome; Depression, major, recurrent, in partial remission ([place]); BMI 22.0-22.9, adult; OAB (  overactive bladder); Osteopenia; Aortic atherosclerosis ([place]) by CXR in 2019; Nervous; Frequent falls; and Unsteady gait on their problem list.  Screening Tests Immunization History  Administered Date(s) Administered   Influenza Split 04/10/2013, 03/23/2016   Influenza, High Dose Seasonal PF 03/24/2014, 02/27/2015, 05/13/2021   Influenza-Unspecified 03/27/2018   Moderna Covid-19 Vaccine Bivalent Booster 76yr & up 05/01/2021   Moderna Sars-Covid-2 Vaccination 07/26/2019, 08/23/2019, 04/21/2020   Pneumococcal Conjugate-13 03/24/2014   Pneumococcal Polysaccharide-23 06/13/2008   Td 09/24/2012   Tdap 09/26/2014, 09/25/2020, 11/02/2020   Zoster, Live 02/23/2011   Health Maintenance  Topic Date Due   Zoster Vaccines- Shingrix (1 of 2) Never done   DEXA SCAN  11/09/2019   OPHTHALMOLOGY EXAM  08/11/2021   URINE MICROALBUMIN  09/16/2021   HEMOGLOBIN A1C  11/11/2021   INFLUENZA VACCINE  01/11/2022   FOOT EXAM  11/12/2022   TETANUS/TDAP  11/03/2030   Pneumonia Vaccine 81 Years old  Completed   COVID-19 Vaccine  Completed   HPV VACCINES  Aged Out   Last colonoscopy: 2013,  diverticulosis, never polyps, Dr. EOletta Lamas DONE due to age Last mammogram:  10/2017 - overdue, order pending in system from 05/2021, patient states intends to schedule DEXA 10/2017 - T -1.4 forearm , order pending in system from 05/2021 - encouraged to schedule PAP: remote, DONE   Shingrix: - discussed, get at pharmacy   Names of Other Physician/Practitioners you currently use: 1. [facility]- here for primary care [facility] Dr. B[facility]etta Close eye doctor, 04/2021 - reports requested  3. Dr. SJohnn Hai dentist, last visit 2021, due to schedule   Patient Care Team: MUnk [name] MD as PCP - General (Internal Medicine) DBo Merino MD as Consulting Physician (Rheumatology) ELaurence Spates MD (Inactive) as Consulting Physician (Gastroenterology) HAllyn Kenner MD (Dermatology) ERush Landmark [facility] Pharmacist (Pharmacist)  Surgical: She  has a past surgical history that includes ORIF ankle fracture (Left, 09/26/2014); Abdominal hysterectomy; Tonsillectomy; and ORIF ankle fracture (Left, 09/26/2014). Family Her family history includes Breast cancer in her sister; Cancer in her maternal grandfather; Colon cancer in her mother; Liver disease (age of onset: 679 in her sister; Lung cancer in her brother and father. Social history  She reports that she has never smoked. She has never used smokeless tobacco. She reports that she does not drink alcohol and does not use drugs.   Review of Systems  Constitutional:  Negative for malaise/fatigue and weight loss.  HENT:  Positive for hearing loss (progressive she plans to reach out to audiology). Negative for tinnitus.   Eyes:  Negative for blurred vision and double vision.  Respiratory:  Negative for cough, shortness of breath and wheezing.   Cardiovascular:  Negative for chest pain, palpitations, orthopnea, claudication and leg swelling.  Gastrointestinal:  Negative for abdominal pain, blood in stool,  constipation, diarrhea, heartburn, melena, nausea and vomiting.  Genitourinary: Negative.   Musculoskeletal:  Positive for falls (1 a few months ago, mechanical). Negative for joint pain and myalgias.  Skin:  Negative for rash.  Neurological:  Negative for dizziness, tingling, sensory change, weakness and headaches.  Endo/Heme/Allergies:  Negative for polydipsia.  Psychiatric/Behavioral: Negative.  Negative for depression (controlled), substance abuse and suicidal ideas. The patient is not nervous/anxious and does not have insomnia (controlled with med).   All other systems reviewed and are negative.   Objective:   Today's Vitals   11/11/21 1401  BP: 120/68  Pulse: 85  Temp: (!) 97.3 F (36.3 C)  SpO2: 98%  Weight: 137 lb 9.6 oz (62.4 kg)  Height: '5\' 5"'  (1.651 m)     Body mass index is 22.9 kg/m. Wt Readings from Last 3 Encounters:  11/11/21 137 lb 9.6 oz (62.4 kg)  07/05/21 132 lb (59.9 kg)  06/08/21 133 lb (60.3 kg)    General appearance: alert, no distress, WD/WN,  female HEENT: normocephalic, sclerae anicteric, TMs pearly, nares patent, no discharge or erythema, pharynx normal. Mildly HOH.  Oral cavity: MMM, no lesions Neck: supple, no lymphadenopathy, no thyromegaly, normal thyroid Heart: RRR, normal S1, S2, no murmurs Lungs: CTA bilaterally, no wheezes, rhonchi, or rales Abdomen: +bs, soft, non tender, non distended, no masses, no hepatomegaly, no splenomegaly Musculoskeletal: nontender, no swelling; chronic displaced mid shaft left clavicle fracture. R shoulder with limited ROM (abduction, rotation).  Extremities: no edema, no cyanosis, no clubbing Pulses: 2+ symmetric, upper and lower extremities, normal cap refill Neurological: alert, oriented x 3, CN2-12 intact, 4/5 strength throughput, sensation normal throughout, DTRs 2+ throughout, no cerebellar signs, gait slow and unsteady Psychiatric: normal affect, behavior normal, pleasant  Breasts: Breasts: breasts  appear normal, no suspicious masses, no skin or nipple changes or axillary nodes, well healed surgical scars bil (hx of implant removal). GU: defer  EKG: NSR  Izora Ribas, NP   11/11/2021

## 2021-11-12 ENCOUNTER — Other Ambulatory Visit: Payer: Self-pay | Admitting: Adult Health

## 2021-11-12 ENCOUNTER — Encounter: Payer: Self-pay | Admitting: Adult Health

## 2021-11-12 DIAGNOSIS — E1169 Type 2 diabetes mellitus with other specified complication: Secondary | ICD-10-CM

## 2021-11-12 LAB — CBC WITH DIFFERENTIAL/PLATELET
Absolute Monocytes: 454 cells/uL (ref 200–950)
Basophils Absolute: 22 cells/uL (ref 0–200)
Basophils Relative: 0.4 %
Eosinophils Absolute: 129 cells/uL (ref 15–500)
Eosinophils Relative: 2.3 %
HCT: 35.6 % (ref 35.0–45.0)
Hemoglobin: 11.5 g/dL — ABNORMAL LOW (ref 11.7–15.5)
Lymphs Abs: 1473 cells/uL (ref 850–3900)
MCH: 29.2 pg (ref 27.0–33.0)
MCHC: 32.3 g/dL (ref 32.0–36.0)
MCV: 90.4 fL (ref 80.0–100.0)
MPV: 9.3 fL (ref 7.5–12.5)
Monocytes Relative: 8.1 %
Neutro Abs: 3522 cells/uL (ref 1500–7800)
Neutrophils Relative %: 62.9 %
Platelets: 329 10*3/uL (ref 140–400)
RBC: 3.94 10*6/uL (ref 3.80–5.10)
RDW: 13.1 % (ref 11.0–15.0)
Total Lymphocyte: 26.3 %
WBC: 5.6 10*3/uL (ref 3.8–10.8)

## 2021-11-12 LAB — COMPLETE METABOLIC PANEL WITH GFR
AG Ratio: 1.7 (calc) (ref 1.0–2.5)
ALT: 7 U/L (ref 6–29)
AST: 11 U/L (ref 10–35)
Albumin: 4.5 g/dL (ref 3.6–5.1)
Alkaline phosphatase (APISO): 61 U/L (ref 37–153)
BUN: 20 mg/dL (ref 7–25)
CO2: 30 mmol/L (ref 20–32)
Calcium: 10.1 mg/dL (ref 8.6–10.4)
Chloride: 100 mmol/L (ref 98–110)
Creat: 0.81 mg/dL (ref 0.60–0.95)
Globulin: 2.6 g/dL (calc) (ref 1.9–3.7)
Glucose, Bld: 82 mg/dL (ref 65–99)
Potassium: 5.5 mmol/L — ABNORMAL HIGH (ref 3.5–5.3)
Sodium: 139 mmol/L (ref 135–146)
Total Bilirubin: 0.4 mg/dL (ref 0.2–1.2)
Total Protein: 7.1 g/dL (ref 6.1–8.1)
eGFR: 72 mL/min/{1.73_m2} (ref >=60)

## 2021-11-12 LAB — URINALYSIS, ROUTINE W REFLEX MICROSCOPIC
Bacteria, UA: NONE SEEN /HPF
Bilirubin Urine: NEGATIVE
Glucose, UA: NEGATIVE
Hgb urine dipstick: NEGATIVE
Hyaline Cast: NONE SEEN /LPF
Leukocytes,Ua: NEGATIVE
Nitrite: NEGATIVE
Specific Gravity, Urine: 1.031 (ref 1.001–1.035)
Squamous Epithelial / HPF: NONE SEEN /HPF (ref <=5)
WBC, UA: NONE SEEN /HPF (ref 0–5)
pH: 8 (ref 5.0–8.0)

## 2021-11-12 LAB — LIPID PANEL
Cholesterol: 239 mg/dL — ABNORMAL HIGH (ref <200)
HDL: 57 mg/dL (ref >=50)
LDL Cholesterol (Calc): 154 mg/dL (calc) — ABNORMAL HIGH
Non-HDL Cholesterol (Calc): 182 mg/dL (calc) — ABNORMAL HIGH (ref <130)
Total CHOL/HDL Ratio: 4.2 (calc) (ref <5.0)
Triglycerides: 150 mg/dL — ABNORMAL HIGH (ref <150)

## 2021-11-12 LAB — MICROALBUMIN / CREATININE URINE RATIO
Creatinine, Urine: 122 mg/dL (ref 20–275)
Microalb Creat Ratio: 20 mcg/mg creat (ref <30)
Microalb, Ur: 2.4 mg/dL

## 2021-11-12 LAB — VITAMIN D 25 HYDROXY (VIT D DEFICIENCY, FRACTURES): Vit D, 25-Hydroxy: 43 ng/mL (ref 30–100)

## 2021-11-12 LAB — HEMOGLOBIN A1C
Hgb A1c MFr Bld: 5.4 % of total Hgb (ref <5.7)
Mean Plasma Glucose: 108 mg/dL
eAG (mmol/L): 6 mmol/L

## 2021-11-12 LAB — MAGNESIUM: Magnesium: 2 mg/dL (ref 1.5–2.5)

## 2021-11-12 LAB — TSH: TSH: 2.15 mIU/L (ref 0.40–4.50)

## 2021-11-12 MED ORDER — ROSUVASTATIN CALCIUM 5 MG PO TABS: ORAL_TABLET | ORAL | 3 refills | Status: DC

## 2021-11-15 LAB — HM DIABETES EYE EXAM

## 2021-11-25 ENCOUNTER — Encounter: Payer: Self-pay | Admitting: Internal Medicine

## 2021-12-08 ENCOUNTER — Encounter: Payer: Self-pay | Admitting: Internal Medicine

## 2021-12-29 ENCOUNTER — Other Ambulatory Visit: Payer: Self-pay | Admitting: Nurse Practitioner

## 2021-12-29 DIAGNOSIS — F411 Generalized anxiety disorder: Secondary | ICD-10-CM

## 2022-02-01 DIAGNOSIS — Z6822 Body mass index (BMI) 22.0-22.9, adult: Secondary | ICD-10-CM | POA: Diagnosis not present

## 2022-02-01 DIAGNOSIS — N1831 Chronic kidney disease, stage 3a: Secondary | ICD-10-CM | POA: Diagnosis not present

## 2022-02-01 DIAGNOSIS — E1122 Type 2 diabetes mellitus with diabetic chronic kidney disease: Secondary | ICD-10-CM | POA: Diagnosis not present

## 2022-02-01 DIAGNOSIS — Z7984 Long term (current) use of oral hypoglycemic drugs: Secondary | ICD-10-CM | POA: Diagnosis not present

## 2022-02-15 ENCOUNTER — Ambulatory Visit: Payer: PPO | Admitting: Nurse Practitioner

## 2022-03-18 ENCOUNTER — Other Ambulatory Visit: Payer: Self-pay | Admitting: Internal Medicine

## 2022-03-30 ENCOUNTER — Ambulatory Visit: Payer: Medicare HMO | Admitting: Adult Health

## 2022-03-31 ENCOUNTER — Ambulatory Visit: Payer: Medicare HMO | Admitting: Nurse Practitioner

## 2022-04-11 ENCOUNTER — Other Ambulatory Visit: Payer: Self-pay | Admitting: Nurse Practitioner

## 2022-05-03 ENCOUNTER — Encounter: Payer: Self-pay | Admitting: Nurse Practitioner

## 2022-05-03 ENCOUNTER — Ambulatory Visit (INDEPENDENT_AMBULATORY_CARE_PROVIDER_SITE_OTHER): Payer: Medicare HMO | Admitting: Nurse Practitioner

## 2022-05-03 VITALS — BP 124/80 | HR 99 | Temp 97.7°F | Ht 65.0 in | Wt 146.6 lb

## 2022-05-03 DIAGNOSIS — H6022 Malignant otitis externa, left ear: Secondary | ICD-10-CM | POA: Diagnosis not present

## 2022-05-03 DIAGNOSIS — I10 Essential (primary) hypertension: Secondary | ICD-10-CM

## 2022-05-03 MED ORDER — DOXYCYCLINE HYCLATE 100 MG PO CAPS: 100.0000 mg | ORAL_CAPSULE | Freq: Two times a day (BID) | ORAL | 0 refills | Status: AC

## 2022-05-03 MED ORDER — PREDNISONE 20 MG PO TABS: ORAL_TABLET | ORAL | 0 refills | Status: AC

## 2022-05-03 MED ORDER — SULFAMETHOXAZOLE-TRIMETHOPRIM 400-80 MG PO TABS: 1.0000 | ORAL_TABLET | Freq: Two times a day (BID) | ORAL | 0 refills | Status: AC

## 2022-05-03 NOTE — Patient Instructions (Addendum)
If develop high fever, worsening pain or worsening discharge go to the ER  Tylenol 500 mg 2 tabs q 6 hours for pain  Take Activia yogurt while on both antibiotics  Otitis Externa  Otitis externa is an infection of the outer ear canal. The outer ear canal is the area between the outside of the ear and the eardrum. Otitis externa is sometimes called swimmer's ear. What are the causes? Common causes of this condition include: Swimming in dirty water. Moisture in the ear. An injury to the inside of the ear. An object stuck in the ear. A cut or scrape on the outside of the ear or in the ear canal. What increases the risk? You are more likely to develop this condition if you go swimming often. What are the signs or symptoms? The first symptom of this condition is often itching in the ear. Later symptoms of the condition include: Swelling of the ear. Redness in the ear. Ear pain. The pain may get worse when you pull on your ear. Pus coming from the ear. How is this diagnosed? This condition may be diagnosed by examining the ear and testing fluid from the ear for bacteria and funguses. How is this treated? This condition may be treated with: Antibiotic ear drops. These are often given for 10-14 days. Medicines to reduce itching and swelling. Follow these instructions at home: If you were prescribed antibiotic ear drops, use them as told by your health care provider. Do not stop using the antibiotic even if you start to feel better. Take over-the-counter and prescription medicines only as told by your health care provider. Avoid getting water in your ears as told by your health care provider. This may include avoiding swimming or water sports for a few days. Keep all follow-up visits. This is important. How is this prevented? Keep your ears dry. Use the corner of a towel to dry your ears after you swim or bathe. Avoid scratching or putting things in your ear. Doing these things can damage  the ear canal or remove the protective wax that lines it, which makes it easier for bacteria and funguses to grow. Avoid swimming in lakes, polluted water, or swimming pools that may not have enough chlorine. Contact a health care provider if: You have a fever. Your ear is still red, swollen, painful, or draining pus after 3 days. Your redness, swelling, or pain gets worse. You have a severe headache. Get help right away if: You have redness, swelling, and pain or tenderness in the area behind your ear. Summary Otitis externa is an infection of the outer ear canal. Common causes include swimming in dirty water, moisture in the ear, or a cut or scrape in the ear. Symptoms include pain, redness, and swelling of the ear canal. If you were prescribed antibiotic ear drops, use them as told by your health care provider. Do not stop using the antibiotic even if you start to feel better. This information is not intended to replace advice given to you by your health care provider. Make sure you discuss any questions you have with your health care provider. Document Revised: 08/12/2020 Document Reviewed: 08/12/2020 Elsevier Patient Education  [place].

## 2022-05-03 NOTE — Progress Notes (Signed)
Assessment and Plan: Teisha was seen today for acute visit.  Diagnoses and all orders for this visit:  Essential hypertension - Controlled without medication. continue DASH diet, exercise and monitor at home. Call if greater than 130/80.   Acute malignant otitis externa of left ear Begin Doxycycline and Septra, take probiotic or Activia yogurt to support gut health while on antibiotics Use Tylenol 500 mg 2 tab q 6 hours as needed for pain/fever. If develops fever greater than 102, increase discharge with odor or increased pain go to ER -     doxycycline (VIBRAMYCIN) 100 MG capsule; Take 1 capsule (100 mg total) by mouth 2 (two) times daily for 10 days. -     sulfamethoxazole-trimethoprim (BACTRIM) 400-80 MG tablet; Take 1 tablet by mouth 2 (two) times daily for 10 days. -     predniSONE (DELTASONE) 20 MG tablet; 3 tablets daily with food for 3 days, 2 tabs daily for 3 days, 1 tab a day for 5 days.       Further disposition pending results of labs. Discussed med's effects and SE's.   Over 30 minutes of exam, counseling, chart review, and critical decision making was performed.   Future Appointments  Date Time Provider [place]  05/17/2022  2:30 PM Alycia Rossetti, NP [facility] None  11/14/2022  2:00 PM [place], Kenney Houseman, NP [facility] None    ------------------------------------------------------------------------------------------------------------------   HPI BP 124/80   Pulse 99   Temp 97.7 F (36.5 C)   Ht '5\' 5"'$  (1.651 m)   Wt 146 lb 9.6 oz (66.5 kg)   SpO2 95%   BMI 24.40 kg/m    81 y.o.female presents for drainage and fluid from left ear. Left side of neck also appears swollen.  She is having pain in both ears.  Symptoms have been occurring for 10 days. She has been using an old prescription she had of Prednisone 10 mg yesterday and 5 mg today. Denies fever, no difficulty swallowing, coughing and congestion    BMI is Body mass index is 24.4 kg/m., she  has not been working on diet and exercise. Wt Readings from Last 3 Encounters:  05/03/22 146 lb 9.6 oz (66.5 kg)  11/11/21 137 lb 9.6 oz (62.4 kg)  07/05/21 132 lb (59.9 kg)    BP is currently well controlled without medication Denies headaches, chest pain and shortness of breath.  BP Readings from Last 3 Encounters:  05/03/22 124/80  11/11/21 120/68  07/05/21 (!) 164/81   She is currently on Rosuvastatin 5 mg, cholesterol is not at goal.  Lab Results  Component Value Date   CHOL 239 (H) 11/11/2021   HDL 57 11/11/2021   LDLCALC 154 (H) 11/11/2021   TRIG 150 (H) 11/11/2021   CHOLHDL 4.2 11/11/2021     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HCC)    Subdural hematoma ([place]) 11/02/2020   Vitamin D deficiency      Allergies  Allergen Reactions   Effexor [Venlafaxine] Other (See Comments)    Not effective   Nortriptyline Other (See Comments)    Not effective   Other Other (See Comments)    Spicy foods cause heartburn   Paxil [Paroxetine Hcl] Other (See Comments)    Not effective   Robaxin [Methocarbamol] Itching  Tizanidine Itching   Zoloft [Sertraline Hcl] Other (See Comments)    Not effective    Current Outpatient Medications on File Prior to Visit  Medication Sig   acetaminophen (TYLENOL) 650 MG CR tablet Take 650 mg by mouth See admin instructions. Take 650 mg by mouth at bedtime and an additional 650 mg during the day as needed for pain   ALPRAZolam (XANAX) 1 MG tablet TAKE 1/2-1 TABLET 2-3 TIMES PER DAY ONLY IF NEEDED FOR ANXIETY ATTACK & LIMIT TO 5 DAYS /WEEK TO AVOID ADDICTION & DEMENTIA   Cholecalciferol (VITAMIN D-3 PO) Take 1-2 capsules by mouth daily. 2000 iu per day   esomeprazole (NEXIUM) 40 MG capsule TAKE 1 CAPSULE DAILY TO PREVENT HEARTBURN & INDIGESTION   FLUoxetine  (PROZAC) 40 MG capsule TAKE 1 CAPSULE DAILY FOR MOOD   fluticasone (FLONASE) 50 MCG/ACT nasal spray Place 1-2 sprays into both nostrils daily as needed for allergies or rhinitis.   metFORMIN (GLUCOPHAGE-XR) 500 MG 24 hr tablet TAKE 2 TABLETS BY MOUTH 2 X /DAY WITH MEALS FOR DIABETES   Multiple Vitamins-Minerals (ONE-A-DAY PROACTIVE 65+) TABS Take 1 tablet by mouth daily with breakfast.   Propylene Glycol (SYSTANE BALANCE OP) Place 1 drop into both eyes 4 (four) times daily as needed (dry eyes).   sodium chloride (MURO 128) 5 % ophthalmic solution Place 1 drop into both eyes in the morning, at noon, and at bedtime.   Multiple Vitamins-Minerals (ICAPS) CAPS Take 1 capsule by mouth daily with lunch. (Patient not taking: Reported on 05/03/2022)   rosuvastatin (CRESTOR) 5 MG tablet Take 1 tab daily for cholesterol to prevent heart attack/stroke. (Patient not taking: Reported on 05/03/2022)   solifenacin (VESICARE) 10 MG tablet Take 1 tab daily for over active bladder. (Patient not taking: Reported on 05/03/2022)   traZODone (DESYREL) 50 MG tablet TAKE 1/2-1 TABLET BY MOUTH FOR SLEEP. (Patient not taking: Reported on 11/11/2021)   No current facility-administered medications on file prior to visit.    ROS: all negative except above.   Physical Exam:  BP 124/80   Pulse 99   Temp 97.7 F (36.5 C)   Ht '5\' 5"'$  (1.651 m)   Wt 146 lb 9.6 oz (66.5 kg)   SpO2 95%   BMI 24.40 kg/m   General Appearance: Well nourished, in no apparent distress. Eyes: PERRLA, EOMs, conjunctiva no swelling or erythema Sinuses: No Frontal/maxillary tenderness ENT/Mouth:Left external ear is swollen, erythematous and draining serous drainage, Left ear canal is very swollen. R TM normal- no bulging or erythema Neck: Supple, thyroid normal.  Respiratory: Respiratory effort normal, BS equal bilaterally without rales, rhonchi, wheezing or stridor.  Cardio: RRR with no MRGs. Brisk peripheral pulses without edema.  Abdomen:  Soft, + BS.  Non tender, no guarding, rebound, hernias, masses. Lymphatics: Non tender without lymphadenopathy.  Musculoskeletal: Full ROM, 5/5 strength, normal gait.  Skin: Warm, dry without rashes, lesions, ecchymosis.  Neuro: Cranial nerves intact. Normal muscle tone, no cerebellar symptoms. Sensation intact.  Psych: Awake and oriented X 3, normal affect, Insight and Judgment appropriate.     Alycia Rossetti, NP 2:02 PM [facility]

## 2022-05-09 ENCOUNTER — Other Ambulatory Visit: Payer: Self-pay | Admitting: Nurse Practitioner

## 2022-05-09 DIAGNOSIS — F411 Generalized anxiety disorder: Secondary | ICD-10-CM

## 2022-05-16 NOTE — Progress Notes (Unsigned)
MEDICARE ANNUAL WELLNESS VISIT AND FOLLOW UP  Assessment:   Encounter for Medicare annual wellness exam Due annually  Health maintenance reviewed  She will follow up ophthalmology saw Dr Claudine Mouton 05/2021, and schedule mammogram with dexa (bre[facility])  Atherosclerosis of aorta [facility]) Per CXR 2019  Control blood pressure, cholesterol, glucose, increase exercise.   Essential hypertension - continue medications, DASH diet, exercise and monitor at home. Call if greater than 130/80.  -     CBC with Differential/Platelet -     CMP/GFR   Type 2 diabetes mellitus with stage 2 chronic kidney disease, without long-term current use of insulin (HCC) -     Hemoglobin A1c Discussed general issues about diabetes pathophysiology and management., Educational material distributed., Suggested low cholesterol diet., Encouraged aerobic exercise., Discussed foot care., Reminded to get yearly retinal exam.  Mixed hyperlipidemia associated with T2DM ([place]) - continue medications, check lipids, decrease fatty foods, increase activity.  -     Lipid panel  CKD II assocaited with T2DM (HCC) Increase fluids, avoid NSAIDS, monitor sugars, will monitor  Degenerative disc disease, lumbar Followed by Dr. [name]  Primary osteoarthritis of both knees Followed by Dr.Graves  Non-Hodgkin's lymphoma, unspecified body region, unspecified non-Hodgkin lymphoma type ([place]) Monitor symptoms, in remission, released by Dr. Jana Hakim  Depression, major, recurrent, in partial remission ([place]) Continue prozac, formerly followed with psych Discussed risks with benzo, she reports taking 0.5 mg once a day She has been off for several days; encouraged she reduce use to rare PRN only if possible  Lifestyle discussed: diet/exerise, sleep hygiene, stress management, hydration  Insomnia Excess sedation with gabapentin 300 mg, mirtazepine 15 mg Discussed lower doses but patient prefers to try alternative agent Will try very low  dose trazodone - 25 mg, increase to 50 mg only if needed. Follow up if any SE.   Fibromyalgia syndrome Continue medications No longer follows with Dr. Estanislado Pandy  Medication management -     Magnesium  Vitamin D deficiency Continue supplement  Gastroesophageal reflux disease, esophagitis presence not specified Continue PPI/H2 blocker, diet discussed  Irritable bowel syndrome, unspecified type  Recently fairly controlled; add soluble fiber if needed  Osteopenia Repeat DEXA 2021- ordered to schedule, continue Vit D and Ca, weight bearing exercises  OAB Wears pull ups  High risk for falls  Has completed in home PT this year, continues with daily exercises Encouraged regular use of walker Reviewed beer's list and high risk meds; d/c gabapentin and mirtazepine due to excess sedation with lack of adequate perceived benefit;  She has been off of xanax for 3 days; discussed risks and encouraged her to minimize use, use smallest dose possible.   BMI 22 Monitor; encourage heathy diet and regular exercise  Weight loss of 10% in 6 months Reviewed in detail; initial drop with hospitalization; slow trend down since then; initially she was working on intentional weight loss No other concerning sx Check TSH, A1C; reviewed cancer screening, update as due Encouraged high protein and healthy fats in diet;  Discussed possibly persuing CT chest/abd; discussed risks; she would like to hold off for now; she did have CT head/neck without lymphadenopathy noted on 12/10/2020 [name] will help monitor closely at home, sooner follow up if needed    Over 30 minutes of exam, counseling, chart review, and critical decision making was performed  Future Appointments  Date Time Provider [place]  11/14/2022  2:00 PM Darrol Jump, NP [facility] None  02/16/2023  2:00 PM Alycia Rossetti, NP [facility]  None    Plan:   During the course of the visit the patient was educated and counseled about  appropriate screening and preventive services including:   Pneumococcal vaccine  Influenza vaccine Td vaccine Prevnar 13 Screening electrocardiogram Screening mammography Bone densitometry screening Colorectal cancer screening Diabetes screening Glaucoma screening Nutrition counseling  Advanced directives: given info/requested copies   Subjective:   Debra Short is a 81 y.o. female who presents for Medicare Annual Wellness Visit and 3 month follow up. She has Hyperlipidemia associated with type 2 diabetes mellitus (HCC); Essential hypertension; Gastroesophageal reflux disease; Vitamin D deficiency; IBS (irritable bowel syndrome); Type 2 diabetes mellitus (HCC); Non Hodgkin's lymphoma (HCC); Medication management; Insomnia; CKD stage 3 due to type 2 diabetes mellitus (HCC); Degenerative disc disease, lumbar; Primary osteoarthritis of both knees; Fibromyalgia syndrome; Depression, major, recurrent, in partial remission (HCC); BMI 22.0-22.9, adult; OAB (overactive bladder); Osteopenia; Aortic atherosclerosis (HCC) by CXR in 2019; Nervous; Frequent falls; and Unsteady gait on their problem list. Non-hodgkin's was treated by Dr. Magrinat, in remission.  She is here with [name] here caregiver who checks on her 4 days a week, can call her or son (in [place]) if needed. She lives by herself. [name] helps with housework and driving when needed.   Hx/o falls - had a Subdural Hematoma on May 23/2022 and was discharged home 2 days later and after several ER visits for falls,  she was finally held til transferred to [facility] on 11/16/2020  Then on 6/30 a the NH, she slipped in the shower sustaining a laceration of her posterior scalp requiring stapling.  Head & Cx CT scans found no acute pathology.   She was discharged from NH back home on 12/11/2020. She did complete home PT for balance.   She uses walker intermittently, has had several falls but has not hurt herself  She has been on prozac 40 mg  daily for depression/anxiety for many years, PRN xanax, was seeing psych but hasn't followed up recently. She reports takes 1/4-1/2 tab PRN and estimates 1 month supply lasts 4 months.    Patient has a long standing history of fibromyalgia syndrome followed by Dr Deveshwar and previously prescribed hydrocodone but this was d/c'd related to concerns with falling. She denies pain at this time. Occ uses Tylenol0  IBS managed by lifestyle  BMI is Body mass index is 24.36 kg/m., she has been working on diet, exercise is limited. Has been using her walker and balance has improved slightly She does report good appetite, eating 2-3 full meals daily. She is vegetarian but reports eats 2 full bags of walnuts/week.  Wt Readings from Last 3 Encounters:  05/17/22 146 lb 6.4 oz (66.4 kg)  05/03/22 146 lb 9.6 oz (66.5 kg)  11/11/21 137 lb 9.6 oz (62.4 kg)   Has aortic atherosclerosis per CXR 2019  Her blood pressure has been controlled at home, today their BP is BP: 108/82  BP Readings from Last 3 Encounters:  05/17/22 108/82  05/03/22 124/80  11/11/21 120/68   Not on ACE/ARB due to low BPs She does not workout. She denies chest pain, shortness of breath, dizziness. She will occasionally get some walking in around the house or do stretches but is not formally exercising.    She is not currently on cholesterol medication , tried statins but was unable to take due to myalgias. Her cholesterol is at goal. The cholesterol last visit was:   Lab Results  Component Value Date   CHOL   239 (H) 11/11/2021   HDL 57 11/11/2021   LDLCALC 154 (H) 11/11/2021   TRIG 150 (H) 11/11/2021   CHOLHDL 4.2 11/11/2021   She has been working on diet/exercise for T2DM well controlled on metformin, most recently back into normal range. She has been taking metformin takes 2 tabs twice daily depending on diet. She denies hypoglycemia, extremity paraesthesias, polydipsia, polyuria, blurry vision. She admits she does not check  fasting glucose.  Lab Results  Component Value Date   HGBA1C 5.4 11/11/2021   CKD II associated with T2DM monitored at this office:  Lab Results  Component Value Date   EGFR 72 11/11/2021    Patient is on Vitamin D supplement. Lab Results  Component Value Date   VD25OH 43 11/11/2021          Medication Review Current Outpatient Medications on File Prior to Visit  Medication Sig Dispense Refill   acetaminophen (TYLENOL) 650 MG CR tablet Take 650 mg by mouth See admin instructions. Take 650 mg by mouth at bedtime and an additional 650 mg during the day as needed for pain     ALPRAZolam (XANAX) 1 MG tablet TAKE 1/2-1 TABLET 2-3 TIMES PER DAY ONLY IF NEEDED FOR ANXIETY ATTACK & LIMIT TO 5 DAYS /WEEK TO AVOID ADDICTION & DEMENTIA 60 tablet 0   Cholecalciferol (VITAMIN D-3 PO) Take 1-2 capsules by mouth daily. 2000 iu per day     esomeprazole (NEXIUM) 40 MG capsule TAKE 1 CAPSULE DAILY TO PREVENT HEARTBURN & INDIGESTION 90 capsule 33   FLUoxetine (PROZAC) 40 MG capsule TAKE 1 CAPSULE DAILY FOR MOOD 90 capsule 3   fluticasone (FLONASE) 50 MCG/ACT nasal spray Place 1-2 sprays into both nostrils daily as needed for allergies or rhinitis. 48 mL 4   metFORMIN (GLUCOPHAGE-XR) 500 MG 24 hr tablet TAKE 2 TABLETS BY MOUTH 2 X /DAY WITH MEALS FOR DIABETES 360 tablet 1   Multiple Vitamins-Minerals (ICAPS) CAPS Take 1 capsule by mouth daily with lunch.     Multiple Vitamins-Minerals (ONE-A-DAY PROACTIVE 65+) TABS Take 1 tablet by mouth daily with breakfast.     Propylene Glycol (SYSTANE BALANCE OP) Place 1 drop into both eyes 4 (four) times daily as needed (dry eyes).     rosuvastatin (CRESTOR) 5 MG tablet Take 1 tab daily for cholesterol to prevent heart attack/stroke. 90 tablet 3   sodium chloride (MURO 128) 5 % ophthalmic solution Place 1 drop into both eyes in the morning, at noon, and at bedtime.     solifenacin (VESICARE) 10 MG tablet Take 1 tab daily for over active bladder. 90 tablet 3    traZODone (DESYREL) 50 MG tablet TAKE 1/2-1 TABLET BY MOUTH FOR SLEEP. 90 tablet 1   No current facility-administered medications on file prior to visit.    Allergies: Allergies  Allergen Reactions   Effexor [Venlafaxine] Other (See Comments)    Not effective   Nortriptyline Other (See Comments)    Not effective   Other Other (See Comments)    Spicy foods cause heartburn   Paxil [Paroxetine Hcl] Other (See Comments)    Not effective   Robaxin [Methocarbamol] Itching   Tizanidine Itching   Zoloft [Sertraline Hcl] Other (See Comments)    Not effective    Current Problems (verified) has Hyperlipidemia associated with type 2 diabetes mellitus ([place]); Essential hypertension; Gastroesophageal reflux disease; Vitamin D deficiency; IBS (irritable bowel syndrome); Type 2 diabetes mellitus ([place]); Non Hodgkin's lymphoma ([place]); Medication management; Insomnia; CKD stage 3 due to  type 2 diabetes mellitus (HCC); Degenerative disc disease, lumbar; Primary osteoarthritis of both knees; Fibromyalgia syndrome; Depression, major, recurrent, in partial remission (HCC); BMI 22.0-22.9, adult; OAB (overactive bladder); Osteopenia; Aortic atherosclerosis (HCC) by CXR in 2019; Nervous; Frequent falls; and Unsteady gait on their problem list.  Screening Tests Immunization History  Administered Date(s) Administered   Influenza Split 04/10/2013, 03/23/2016   Influenza, High Dose Seasonal PF 03/24/2014, 02/27/2015, 05/13/2021, 05/17/2022   Influenza-Unspecified 03/27/2018   Moderna Covid-19 Vaccine Bivalent Booster 18yrs & up 05/01/2021   Moderna Sars-Covid-2 Vaccination 07/26/2019, 08/23/2019, 04/21/2020   Pneumococcal Conjugate-13 03/24/2014   Pneumococcal Polysaccharide-23 06/13/2008   Td 09/24/2012   Tdap 09/26/2014, 09/25/2020, 11/02/2020   Zoster, Live 02/23/2011    Preventative care: Last colonoscopy: 2013, diverticulosis, never polyps, Dr. Edwards, declines further due to age, wants to do  cologuard Last mammogram:  10/2017 - overdue, ordered to schedule DEXA 10/2017 - T -1.4 forearm , ordered to schedule  PAP: remote, DONE   Prior vaccinations: TD or Tdap: 2016, 2022    Influenza: 05/2021 Pneumococcal: 2010 Prevnar13: 2015 Shingles/Zostavax: 2012 Covid 19: 2/2, 2021, moderna + 04/2020 planning nother booster  Names of Other Physician/Practitioners you currently use: 1. [facility]- here for primary care 2. [facility], Dr. Bowen, eye doctor, 04/2021 3. Dr. Szott, dentist, last visit 2021, due to schedule   Patient Care Team: McKeown, William, MD as PCP - General (Internal Medicine) Deveshwar, Shaili, MD as Consulting Physician (Rheumatology) Edwards, James, MD (Inactive) as Consulting Physician (Gastroenterology) Hall, John, MD (Dermatology) Ejaz, Anika, RPH (Inactive) as Pharmacist (Pharmacist)  Surgical: She  has a past surgical history that includes ORIF ankle fracture (Left, 09/26/2014); Abdominal hysterectomy; Tonsillectomy; and ORIF ankle fracture (Left, 09/26/2014). Family Her family history includes Breast cancer in her sister; Cancer in her maternal grandfather; Colon cancer in her mother; Liver disease (age of onset: 60) in her sister; Lung cancer in her brother and father. Social history  She reports that she has never smoked. She has never used smokeless tobacco. She reports that she does not drink alcohol and does not use drugs.  MEDICARE WELLNESS OBJECTIVES: Physical activity: Current Exercise Habits: The patient does not participate in regular exercise at present, Exercise limited by: orthopedic condition(s) Cardiac risk factors: Cardiac Risk Factors include: advanced age (>55men, >65 women);diabetes mellitus;dyslipidemia Depression/mood screen:      05/17/2022    3:14 PM  Depression screen PHQ 2/9  Decreased Interest 0  Down, Depressed, Hopeless 1  PHQ - 2 Score 1  Altered sleeping 1  Tired,  decreased energy 1  Change in appetite 0  Feeling bad or failure about yourself  0  Trouble concentrating 1  Moving slowly or fidgety/restless 0  Suicidal thoughts 0  PHQ-9 Score 4  Difficult doing work/chores Somewhat difficult    ADLs:     05/17/2022    3:15 PM  In your present state of health, do you have any difficulty performing the following activities:  Hearing? 1  Vision? 0  Difficulty concentrating or making decisions? 1  Walking or climbing stairs? 1  Dressing or bathing? 0  Doing errands, shopping? 1     Cognitive Testing  Alert? Yes  Normal Appearance?Yes  Oriented to person? Yes  Place? Yes   Time? Yes  Recall of three objects? Yes, 3/3  Can perform simple calculations? Yes  Displays appropriate judgment?Yes  Can read the correct time from a watch face?Yes  EOL planning: Does Patient Have a Medical   Advance Directive?: Yes Type of Advance Directive: Healthcare Power of Attorney, Living will Does patient want to make changes to medical advance directive?: No - Patient declined Copy of Healthcare Power of Attorney in Chart?: No - copy requested   Objective:   Today's Vitals   05/17/22 1448  BP: 108/82  Pulse: (!) 102  Temp: (!) 96.3 F (35.7 C)  SpO2: 93%  Weight: 146 lb 6.4 oz (66.4 kg)  Height: 5' 5" (1.651 m)     Body mass index is 24.36 kg/m. Wt Readings from Last 3 Encounters:  05/17/22 146 lb 6.4 oz (66.4 kg)  05/03/22 146 lb 9.6 oz (66.5 kg)  11/11/21 137 lb 9.6 oz (62.4 kg)    General appearance: alert, no distress, WD/WN,  female HEENT: normocephalic, sclerae anicteric, TMs pearly, nares patent, no discharge or erythema, pharynx normal. Oral cavity: MMM, no lesions Neck: supple, no lymphadenopathy, no thyromegaly, normal thyroid Heart: RRR, normal S1, S2, no murmurs Lungs: CTA bilaterally, no wheezes, rhonchi, or rales Abdomen: +bs, soft, non tender, non distended, no masses, no hepatomegaly, no splenomegaly Musculoskeletal:  nontender, no swelling, no obvious deformity Extremities: no edema, no cyanosis, no clubbing Pulses: 2+ symmetric, upper and lower extremities, normal cap refill Neurological: alert, oriented x 3, CN2-12 intact, 4/5 strength throughput, sensation normal throughout, DTRs 2+ throughout, no cerebellar signs, gait slow and unsteady Psychiatric: normal affect, behavior normal, pleasant   Medicare Attestation I have personally reviewed: The patient's medical and social history Their use of alcohol, tobacco or illicit drugs Their current medications and supplements The patient's functional ability including ADLs,fall risks, home safety risks, cognitive, and hearing and visual impairment Diet and physical activities Evidence for depression or mood disorders  The patient's weight, height, BMI, and visual acuity have been recorded in the chart.  I have made referrals, counseling, and provided education to the patient based on review of the above and I have provided the patient with a written personalized care plan for preventive services.      E WILKINSON, NP   05/17/2022  

## 2022-05-17 ENCOUNTER — Encounter: Payer: Self-pay | Admitting: Nurse Practitioner

## 2022-05-17 ENCOUNTER — Ambulatory Visit (INDEPENDENT_AMBULATORY_CARE_PROVIDER_SITE_OTHER): Payer: Medicare HMO | Admitting: Nurse Practitioner

## 2022-05-17 ENCOUNTER — Ambulatory Visit: Payer: Medicare HMO | Admitting: Adult Health

## 2022-05-17 VITALS — BP 108/82 | HR 102 | Temp 96.3°F | Ht 65.0 in | Wt 146.4 lb

## 2022-05-17 DIAGNOSIS — E559 Vitamin D deficiency, unspecified: Secondary | ICD-10-CM

## 2022-05-17 DIAGNOSIS — E1169 Type 2 diabetes mellitus with other specified complication: Secondary | ICD-10-CM | POA: Diagnosis not present

## 2022-05-17 DIAGNOSIS — F3341 Major depressive disorder, recurrent, in partial remission: Secondary | ICD-10-CM

## 2022-05-17 DIAGNOSIS — N3281 Overactive bladder: Secondary | ICD-10-CM

## 2022-05-17 DIAGNOSIS — Z0001 Encounter for general adult medical examination with abnormal findings: Secondary | ICD-10-CM

## 2022-05-17 DIAGNOSIS — E785 Hyperlipidemia, unspecified: Secondary | ICD-10-CM | POA: Diagnosis not present

## 2022-05-17 DIAGNOSIS — I10 Essential (primary) hypertension: Secondary | ICD-10-CM

## 2022-05-17 DIAGNOSIS — N182 Chronic kidney disease, stage 2 (mild): Secondary | ICD-10-CM | POA: Diagnosis not present

## 2022-05-17 DIAGNOSIS — I700 Atherosclerosis of aorta: Secondary | ICD-10-CM | POA: Diagnosis not present

## 2022-05-17 DIAGNOSIS — E1122 Type 2 diabetes mellitus with diabetic chronic kidney disease: Secondary | ICD-10-CM

## 2022-05-17 DIAGNOSIS — C8590 Non-Hodgkin lymphoma, unspecified, unspecified site: Secondary | ICD-10-CM

## 2022-05-17 DIAGNOSIS — R6889 Other general symptoms and signs: Secondary | ICD-10-CM

## 2022-05-17 DIAGNOSIS — Z79899 Other long term (current) drug therapy: Secondary | ICD-10-CM | POA: Diagnosis not present

## 2022-05-17 DIAGNOSIS — M85831 Other specified disorders of bone density and structure, right forearm: Secondary | ICD-10-CM

## 2022-05-17 DIAGNOSIS — K219 Gastro-esophageal reflux disease without esophagitis: Secondary | ICD-10-CM | POA: Diagnosis not present

## 2022-05-17 DIAGNOSIS — Z23 Encounter for immunization: Secondary | ICD-10-CM | POA: Diagnosis not present

## 2022-05-17 NOTE — Patient Instructions (Signed)

## 2022-05-18 LAB — CBC WITH DIFFERENTIAL/PLATELET
Absolute Monocytes: 566 cells/uL (ref 200–950)
Basophils Absolute: 39 cells/uL (ref 0–200)
Basophils Relative: 0.6 %
Eosinophils Absolute: 150 cells/uL (ref 15–500)
Eosinophils Relative: 2.3 %
HCT: 36.8 % (ref 35.0–45.0)
Hemoglobin: 12.4 g/dL (ref 11.7–15.5)
Lymphs Abs: 1450 cells/uL (ref 850–3900)
MCH: 31.2 pg (ref 27.0–33.0)
MCHC: 33.7 g/dL (ref 32.0–36.0)
MCV: 92.5 fL (ref 80.0–100.0)
MPV: 9.7 fL (ref 7.5–12.5)
Monocytes Relative: 8.7 %
Neutro Abs: 4297 cells/uL (ref 1500–7800)
Neutrophils Relative %: 66.1 %
Platelets: 298 10*3/uL (ref 140–400)
RBC: 3.98 10*6/uL (ref 3.80–5.10)
RDW: 12.7 % (ref 11.0–15.0)
Total Lymphocyte: 22.3 %
WBC: 6.5 10*3/uL (ref 3.8–10.8)

## 2022-05-18 LAB — COMPLETE METABOLIC PANEL WITH GFR
AG Ratio: 1.7 (calc) (ref 1.0–2.5)
ALT: 9 U/L (ref 6–29)
AST: 13 U/L (ref 10–35)
Albumin: 4.4 g/dL (ref 3.6–5.1)
Alkaline phosphatase (APISO): 56 U/L (ref 37–153)
BUN: 21 mg/dL (ref 7–25)
CO2: 31 mmol/L (ref 20–32)
Calcium: 10.2 mg/dL (ref 8.6–10.4)
Chloride: 102 mmol/L (ref 98–110)
Creat: 0.79 mg/dL (ref 0.60–0.95)
Globulin: 2.6 g/dL (calc) (ref 1.9–3.7)
Glucose, Bld: 88 mg/dL (ref 65–99)
Potassium: 6 mmol/L — ABNORMAL HIGH (ref 3.5–5.3)
Sodium: 141 mmol/L (ref 135–146)
Total Bilirubin: 0.7 mg/dL (ref 0.2–1.2)
Total Protein: 7 g/dL (ref 6.1–8.1)
eGFR: 74 mL/min/{1.73_m2} (ref >=60)

## 2022-05-18 LAB — HEMOGLOBIN A1C
Hgb A1c MFr Bld: 5.7 % of total Hgb — ABNORMAL HIGH (ref <5.7)
Mean Plasma Glucose: 117 mg/dL
eAG (mmol/L): 6.5 mmol/L

## 2022-05-18 LAB — LIPID PANEL
Cholesterol: 252 mg/dL — ABNORMAL HIGH (ref <200)
HDL: 50 mg/dL (ref >=50)
LDL Cholesterol (Calc): 162 mg/dL (calc) — ABNORMAL HIGH
Non-HDL Cholesterol (Calc): 202 mg/dL (calc) — ABNORMAL HIGH (ref <130)
Total CHOL/HDL Ratio: 5 (calc) — ABNORMAL HIGH (ref <5.0)
Triglycerides: 238 mg/dL — ABNORMAL HIGH (ref <150)

## 2022-05-19 ENCOUNTER — Other Ambulatory Visit: Payer: Self-pay | Admitting: Nurse Practitioner

## 2022-05-19 DIAGNOSIS — E875 Hyperkalemia: Secondary | ICD-10-CM

## 2022-05-19 DIAGNOSIS — E1169 Type 2 diabetes mellitus with other specified complication: Secondary | ICD-10-CM

## 2022-05-19 MED ORDER — ROSUVASTATIN CALCIUM 10 MG PO TABS: 10.0000 mg | ORAL_TABLET | Freq: Every day | ORAL | 11 refills | Status: DC

## 2022-06-05 ENCOUNTER — Other Ambulatory Visit: Payer: Self-pay | Admitting: Nurse Practitioner

## 2022-06-05 DIAGNOSIS — E1121 Type 2 diabetes mellitus with diabetic nephropathy: Secondary | ICD-10-CM

## 2022-06-07 ENCOUNTER — Other Ambulatory Visit: Payer: Self-pay

## 2022-06-07 MED ORDER — FLUTICASONE PROPIONATE 50 MCG/ACT NA SUSP: 1.0000 | Freq: Every day | NASAL | 4 refills | Status: DC | PRN

## 2022-08-01 DIAGNOSIS — H353131 Nonexudative age-related macular degeneration, bilateral, early dry stage: Secondary | ICD-10-CM | POA: Diagnosis not present

## 2022-08-01 DIAGNOSIS — H524 Presbyopia: Secondary | ICD-10-CM | POA: Diagnosis not present

## 2022-08-01 DIAGNOSIS — H04123 Dry eye syndrome of bilateral lacrimal glands: Secondary | ICD-10-CM | POA: Diagnosis not present

## 2022-08-16 ENCOUNTER — Ambulatory Visit (INDEPENDENT_AMBULATORY_CARE_PROVIDER_SITE_OTHER): Payer: Medicare Other | Admitting: Internal Medicine

## 2022-08-16 ENCOUNTER — Encounter: Payer: Self-pay | Admitting: Internal Medicine

## 2022-08-16 VITALS — BP 136/84 | HR 90 | Temp 97.3°F | Resp 16 | Ht 65.0 in | Wt 143.4 lb

## 2022-08-16 DIAGNOSIS — E1122 Type 2 diabetes mellitus with diabetic chronic kidney disease: Secondary | ICD-10-CM

## 2022-08-16 DIAGNOSIS — K219 Gastro-esophageal reflux disease without esophagitis: Secondary | ICD-10-CM | POA: Diagnosis not present

## 2022-08-16 DIAGNOSIS — I700 Atherosclerosis of aorta: Secondary | ICD-10-CM | POA: Diagnosis not present

## 2022-08-16 DIAGNOSIS — Z79899 Other long term (current) drug therapy: Secondary | ICD-10-CM

## 2022-08-16 DIAGNOSIS — E1169 Type 2 diabetes mellitus with other specified complication: Secondary | ICD-10-CM

## 2022-08-16 DIAGNOSIS — E785 Hyperlipidemia, unspecified: Secondary | ICD-10-CM

## 2022-08-16 DIAGNOSIS — N1831 Chronic kidney disease, stage 3a: Secondary | ICD-10-CM | POA: Diagnosis not present

## 2022-08-16 DIAGNOSIS — I10 Essential (primary) hypertension: Secondary | ICD-10-CM | POA: Diagnosis not present

## 2022-08-16 DIAGNOSIS — E559 Vitamin D deficiency, unspecified: Secondary | ICD-10-CM

## 2022-08-16 NOTE — Progress Notes (Signed)
Future Appointments  Date Time Provider Department  08/16/2022  2:30 PM Unk [name], MD [facility]  11/14/2022                      wellness  2:00 PM Darrol Jump, NP [facility]  02/16/2023                       cpe  2:00 PM Alycia Rossetti, NP [facility]    History of Present Illness:       This very nice 82 y.o.  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   Patient has Aortic Atherosclerosis by CXR in 2019         Patient is treated for HTN  (2012)  & BP has been controlled at home. Today's BP is at goal -  136/84. Patient has had no complaints of any cardiac type chest pain, palpitations, dyspnea / orthopnea / PND, dizziness, claudication, or dependent edema.        Hyperlipidemia is  not controlled with diet & rosuvastatin . Patient denies myalgias or other med SE's. Last Lipids were not at goal :  Lab Results  Component Value Date   CHOL 252 (H) 05/17/2022   HDL 50 05/17/2022   LDLCALC 162 (H) 05/17/2022   TRIG 238 (H) 05/17/2022   CHOLHDL 5.0 (H) 05/17/2022     Also, the patient has history of  P{reDM ( 2009) & then transitioning into T2_NIDDM  (2016) /CKD3a  and has had no symptoms of reactive hypoglycemia, diabetic polys, paresthesias or visual blurring.  Last A1c was near goal "  Lab Results  Component Value Date   HGBA1C 5.7 (H) 05/17/2022        Further, the patient also has history of Vitamin D Deficiency ("25" /2009)  and supplements vitamin D . Last vitamin D was still low  (goal 70-100) :  Lab Results  Component Value Date   VD25OH 43 11/11/2021     Current Outpatient Medications on File Prior to Visit  Medication Sig   acetaminophen (TYLENOL) 650 MG CR tablet Take 650 mg by mouth See admin instructions. Take 650 mg by mouth at bedtime and an additional 650 mg during the day as needed for pain   ALPRAZolam (XANAX) 1 MG tablet TAKE 1/2-1 TABLET 2-3 x /DAY  IF NEEDED     Cholecalciferol (VITAMIN D-3 PO) Take 1-2 capsules by mouth daily. 2000 iu per day   esomeprazole (NEXIUM) 40 MG capsule TAKE 1 CAPSULE DAILY TO PREVENT HEARTBURN & INDIGESTION   FLUoxetine (PROZAC) 40 MG capsule TAKE 1 CAPSULE DAILY FOR MOOD   FLONASE  nasal spray Place 1-2 sprays into both nostrils daily as needed    metFORMIN -XR) 500 MG 24 hr tablet TAKE 2 TABLETS  2 X /DAY WITH MEALS    Multiple Vitamins-Minerals (ICAPS) CAPS Take 1 capsule  daily with lunch.   Multiple Vitamins-Minerals  Take 1 tablet daily with breakfast.   SYSTANE  Place 1 drop into both eyes 4 (four) times daily as needed    rosuvastatin 10 MG tablet Take 1 tablet  daily.   sodium chloride  5 % ophth  soln 1 drop into eyes in the morning, noon, and bedtime.   solifenacin (VESICARE) 10 MG tablet Take 1 tab daily for over active bladder.   traZODone (DESYREL) 50 MG tablet  TAKE 1/2-1 TABLET  FOR SLEEP.     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[place])    Subdural hematoma ([place]) 11/02/2020   Vitamin D deficiency      Immunization History  Administered Date(s) Administered   Influenza Split 04/10/2013, 03/23/2016   Influenza, High Dose  05/13/2021, 05/17/2022   Influenza-Unspecified 03/27/2018   Moderna Covid-19 Vaccine B 05/01/2021   Moderna Sars-Covid-2 Vacc 07/26/2019, 08/23/2019, 04/21/2020   Pneumococcal - 13 03/24/2014   Pneumococcal - 23  06/13/2008   Td 09/24/2012   Tdap 09/26/2014, 09/25/2020, 11/02/2020   Zoster, Live 02/23/2011     FHx:    Reviewed / unchanged   SHx:    Reviewed / unchanged    Systems Review:  Constitutional: Denies fever, chills, wt changes, headaches, insomnia, fatigue, night sweats, change in appetite. Eyes: Denies redness, blurred vision, diplopia, discharge, itchy, watery eyes.  ENT: Denies discharge, congestion, post nasal drip, epistaxis, sore throat, earache, hearing loss, dental pain, tinnitus, vertigo, sinus pain, snoring.  CV: Denies chest pain, palpitations, irregular heartbeat, syncope, dyspnea, diaphoresis, orthopnea, PND, claudication or edema. Respiratory: denies cough, dyspnea, DOE, pleurisy, hoarseness, laryngitis, wheezing.  Gastrointestinal: Denies dysphagia, odynophagia, heartburn, reflux, water brash, abdominal pain or cramps, nausea, vomiting, bloating, diarrhea, constipation, hematemesis, melena, hematochezia  or hemorrhoids. Genitourinary: Denies dysuria, frequency, urgency, nocturia, hesitancy, discharge, hematuria or flank pain. Musculoskeletal: Denies arthralgias, myalgias, stiffness, jt. swelling, pain, limping or strain/sprain.  Skin: Denies pruritus, rash, hives, warts, acne, eczema or change in skin lesion(s). Neuro: No weakness, tremor, incoordination, spasms, paresthesia or pain. Psychiatric: Denies confusion, memory loss or sensory loss. Endo: Denies change in weight, skin or hair change.  Heme/Lymph: No excessive bleeding, bruising or enlarged lymph nodes.   Physical Exam  BP 136/84   Pulse 90   Temp (!) 97.3 F (36.3 C)   Resp 16   Ht '5\' 5"'$  (1.651 m)   Wt 143 lb 6.4 oz (65 kg)   SpO2 98%   BMI 23.86 kg/m   Appears  well nourished, well groomed  and in no distress.  Eyes: PERRLA, EOMs, conjunctiva no swelling or erythema. Sinuses: No frontal/maxillary tenderness ENT/Mouth: EAC's clear, TM's nl w/o erythema, bulging. Nares clear w/o erythema,  swelling, exudates. Oropharynx clear without erythema or exudates. Oral hygiene is good. Tongue normal, non obstructing. Hearing intact.  Neck: Supple. Thyroid not palpable. Car 2+/2+ without bruits, nodes or JVD. Chest: Respirations nl with BS clear & equal w/o rales, rhonchi, wheezing or stridor.  Cor: Heart sounds normal w/ regular rate and rhythm without sig. murmurs, gallops, clicks or rubs. Peripheral pulses normal and equal  without edema.  Abdomen: Soft & bowel sounds normal. Non-tender w/o guarding, rebound, hernias, masses or organomegaly.  Lymphatics: Unremarkable.  Musculoskeletal: Full ROM all peripheral extremities, joint stability, 5/5 strength and normal gait.  Skin: Warm,  dry without exposed rashes, lesions or ecchymosis apparent.  Neuro: Cranial nerves intact, reflexes equal bilaterally. Sensory-motor testing grossly intact. Tendon reflexes grossly intact.  Pysch: Alert & oriented x 3.  Insight and judgement nl & appropriate. No ideations.   Assessment and Plan:   1. Essential hypertension  - Continue medication, monitor blood pressure at home.  - Continue DASH diet.  Reminder to go to the ER if any CP,  SOB, nausea, dizziness, severe HA, changes vision/speech.     - CBC with Differential/Platelet - COMPLETE METABOLIC PANEL WITH GFR - Magnesium - TSH  2.Hyperlipidemia associated with type 2 diabetes mellitus ([place])  - Continue diet/meds, exercise,& lifestyle modifications.  - Continue monitor periodic cholesterol/liver & renal functions      - Lipid panel - TSH  3. Type 2 diabetes mellitus with stage 3a chronic kidney disease, without long-term current use of insulin ([facility])  - Continue diet, exercise  - Lifestyle modifications.  - Monitor appropriate labs    - Hemoglobin A1c - Insulin, random  4. Vitamin D deficiency  - VIT - Continue supplementation  AMIN D 25 Hydroxy  5. Aortic atherosclerosis ([place]) by CXR in 2019  - Lipid panel  6.  Gastroesophageal reflux disease,  - CBC with Differential/Platelet  7. Medication management  - CBC with Differential/Platelet - COMPLETE METABOLIC PANEL WITH GFR - Magnesium - Lipid panel - TSH - Hemoglobin A1c - Insulin, random - VITAMIN D 25 Hydroxy         Discussed  regular exercise, BP monitoring, weight control to achieve/maintain BMI less than 25 and discussed med and SE's. Recommended labs to assess /monitor clinical status .  I discussed the assessment and treatment plan with the patient. The patient was provided an opportunity to ask questions and all were answered. The patient agreed with the plan and demonstrated an understanding of the instructions.  I provided over 30 minutes of exam, counseling, chart review and  complex critical decision making.        The patient was advised to call back or seek an in-person evaluation if the symptoms worsen or if the condition fails to improve as anticipated.   Kirtland Bouchard, MD

## 2022-08-16 NOTE — Patient Instructions (Signed)

## 2022-08-17 ENCOUNTER — Other Ambulatory Visit: Payer: Self-pay | Admitting: Internal Medicine

## 2022-08-17 DIAGNOSIS — E1169 Type 2 diabetes mellitus with other specified complication: Secondary | ICD-10-CM

## 2022-08-17 LAB — CBC WITH DIFFERENTIAL/PLATELET
Absolute Monocytes: 524 cells/uL (ref 200–950)
Basophils Absolute: 32 cells/uL (ref 0–200)
Basophils Relative: 0.6 %
Eosinophils Absolute: 162 cells/uL (ref 15–500)
Eosinophils Relative: 3 %
HCT: 38.1 % (ref 35.0–45.0)
Hemoglobin: 12.8 g/dL (ref 11.7–15.5)
Lymphs Abs: 1469 cells/uL (ref 850–3900)
MCH: 30.9 pg (ref 27.0–33.0)
MCHC: 33.6 g/dL (ref 32.0–36.0)
MCV: 92 fL (ref 80.0–100.0)
MPV: 9.8 fL (ref 7.5–12.5)
Monocytes Relative: 9.7 %
Neutro Abs: 3213 cells/uL (ref 1500–7800)
Neutrophils Relative %: 59.5 %
Platelets: 315 10*3/uL (ref 140–400)
RBC: 4.14 10*6/uL (ref 3.80–5.10)
RDW: 12 % (ref 11.0–15.0)
Total Lymphocyte: 27.2 %
WBC: 5.4 10*3/uL (ref 3.8–10.8)

## 2022-08-17 LAB — LIPID PANEL
Cholesterol: 227 mg/dL — ABNORMAL HIGH (ref <200)
HDL: 50 mg/dL (ref >=50)
LDL Cholesterol (Calc): 139 mg/dL (calc) — ABNORMAL HIGH
Non-HDL Cholesterol (Calc): 177 mg/dL (calc) — ABNORMAL HIGH (ref <130)
Total CHOL/HDL Ratio: 4.5 (calc) (ref <5.0)
Triglycerides: 250 mg/dL — ABNORMAL HIGH (ref <150)

## 2022-08-17 LAB — COMPLETE METABOLIC PANEL WITH GFR
AG Ratio: 1.6 (calc) (ref 1.0–2.5)
ALT: 11 U/L (ref 6–29)
AST: 15 U/L (ref 10–35)
Albumin: 4.5 g/dL (ref 3.6–5.1)
Alkaline phosphatase (APISO): 69 U/L (ref 37–153)
BUN: 20 mg/dL (ref 7–25)
CO2: 31 mmol/L (ref 20–32)
Calcium: 10 mg/dL (ref 8.6–10.4)
Chloride: 100 mmol/L (ref 98–110)
Creat: 0.9 mg/dL (ref 0.60–0.95)
Globulin: 2.8 g/dL (calc) (ref 1.9–3.7)
Glucose, Bld: 78 mg/dL (ref 65–99)
Potassium: 4.9 mmol/L (ref 3.5–5.3)
Sodium: 140 mmol/L (ref 135–146)
Total Bilirubin: 0.7 mg/dL (ref 0.2–1.2)
Total Protein: 7.3 g/dL (ref 6.1–8.1)
eGFR: 63 mL/min/{1.73_m2} (ref >=60)

## 2022-08-17 LAB — VITAMIN D 25 HYDROXY (VIT D DEFICIENCY, FRACTURES): Vit D, 25-Hydroxy: 72 ng/mL (ref 30–100)

## 2022-08-17 LAB — TSH: TSH: 2.4 mIU/L (ref 0.40–4.50)

## 2022-08-17 LAB — INSULIN, RANDOM: Insulin: 17.8 u[IU]/mL

## 2022-08-17 LAB — MAGNESIUM: Magnesium: 2 mg/dL (ref 1.5–2.5)

## 2022-08-17 LAB — HEMOGLOBIN A1C
Hgb A1c MFr Bld: 5.7 % of total Hgb — ABNORMAL HIGH (ref <5.7)
Mean Plasma Glucose: 117 mg/dL
eAG (mmol/L): 6.5 mmol/L

## 2022-08-17 MED ORDER — ROSUVASTATIN CALCIUM 20 MG PO TABS: ORAL_TABLET | ORAL | 3 refills | Status: DC

## 2022-08-17 NOTE — Progress Notes (Signed)
<><><><><><><><><><><><><><><><><><><><><><><><><><><><><><><><><> <><><><><><><><><><><><><><><><><><><><><><><><><><><><><><><><><> - Test results slightly outside the reference range are not unusual. If there is anything important, I will review this with you,  otherwise it is considered normal test values.  If you have further questions,  please do not hesitate to contact me at the office or via My Chart.  <><><><><><><><><><><><><><><><><><><><><><><><><><><><><><><><><> <><><><><><><><><><><><><><><><><><><><><><><><><><><><><><><><><>  -   - Total Chol = 227  is very high risk for Heart Attack /Stroke /Vascular Dementia     ( Ideal or Goal is less than 180 ! )  & - Bad /Dangerous LDL Chol =  139    - - >> Sitting on a time Bomb !     ( Ideal or Goal is less than 70 ! )    - The cause is Bad Diet !  - But need to go ahead & increase  meds until get on a better diet to try &                                                          reverse some of the Damage already done    - So sent in for a higher dose of Rosuvastatin ( Crestor  )    - Read or listen to   Dr Wyman Songster 's book    " How Not to Die ! "    - Recommend a stricter plant based low cholesterol diet   - Cholesterol only comes from animal sources                                                                 - ie. meat, dairy, egg yolks  - Eat all the vegetables you want.  - Avoid Meat, Avoid Meat , Avoid Meat  ! ! !                                                  -especially red meat - Beef AND Pork  - Avoid cheese & dairy - milk & ice cream.   - Cheese is the most concentrated form of trans-fats which                                                    is the worst thing to clog up our arteries.   - Veggie cheese is OK which can be found in                                              the fresh produce section at  [employer] or [employer] or [employer] <><><><><><><><><><><><><><><><><><><><><><><><><><><><><><><><><>  -  Also Triglycerides (   =   250  ) or fats in blood are too high                 (   Ideal or  Goal is less than 150  !  )    - Recommend avoid fried & greasy foods,  sweets / candy,   - Avoid white rice  (brown or wild rice or Quinoa is OK),   - Avoid white potatoes  (sweet potatoes are OK)   - Avoid anything made from white flour  - bagels, doughnuts, rolls, buns, biscuits, white and   wheat breads, pizza crust and traditional  pasta made of white flour & egg white  - (vegetarian pasta or spinach or wheat pasta is OK).    - Multi-grain bread is OK - like multi-grain flat bread or  sandwich thins.   - Avoid alcohol in excess.   - Exercise is also important. <><><><><><><><><><><><><><><><><><><><><><><><><><><><><><><><><> <><><><><><><><><><><><><><><><><><><><><><><><><><><><><><><><><>  -  Vitamin D = 72 - Excellent - Please keep dosing Same  <><><><><><><><><><><><><><><><><><><><><><><><><><><><><><><><><>  -  All Else - CBC - Kidneys - Electrolytes - Liver - Magnesium & Thyroid    - all  Normal / OK <><><><><><><><><><><><><><><><><><><><><><><><><><><><><><><><><> <><><><><><><><><><><><><><><><><><><><><><><><><><><><><><><><><>

## 2022-09-06 ENCOUNTER — Other Ambulatory Visit: Payer: Self-pay | Admitting: Nurse Practitioner

## 2022-09-06 DIAGNOSIS — F411 Generalized anxiety disorder: Secondary | ICD-10-CM

## 2022-11-14 ENCOUNTER — Encounter: Payer: Medicare Other | Admitting: Nurse Practitioner

## 2022-11-23 ENCOUNTER — Ambulatory Visit: Payer: Medicare Other | Admitting: Nurse Practitioner

## 2022-12-04 ENCOUNTER — Other Ambulatory Visit: Payer: Self-pay | Admitting: Nurse Practitioner

## 2022-12-04 DIAGNOSIS — E1121 Type 2 diabetes mellitus with diabetic nephropathy: Secondary | ICD-10-CM

## 2022-12-22 ENCOUNTER — Encounter: Payer: Self-pay | Admitting: Nurse Practitioner

## 2022-12-22 ENCOUNTER — Ambulatory Visit (INDEPENDENT_AMBULATORY_CARE_PROVIDER_SITE_OTHER): Payer: Medicare Other | Admitting: Nurse Practitioner

## 2022-12-22 VITALS — BP 110/70 | HR 84 | Temp 97.9°F | Ht 65.0 in | Wt 147.2 lb

## 2022-12-22 DIAGNOSIS — I700 Atherosclerosis of aorta: Secondary | ICD-10-CM

## 2022-12-22 DIAGNOSIS — E1169 Type 2 diabetes mellitus with other specified complication: Secondary | ICD-10-CM

## 2022-12-22 DIAGNOSIS — I10 Essential (primary) hypertension: Secondary | ICD-10-CM | POA: Diagnosis not present

## 2022-12-22 DIAGNOSIS — E785 Hyperlipidemia, unspecified: Secondary | ICD-10-CM | POA: Diagnosis not present

## 2022-12-22 DIAGNOSIS — Z0000 Encounter for general adult medical examination without abnormal findings: Secondary | ICD-10-CM

## 2022-12-22 DIAGNOSIS — E1122 Type 2 diabetes mellitus with diabetic chronic kidney disease: Secondary | ICD-10-CM

## 2022-12-22 DIAGNOSIS — H353132 Nonexudative age-related macular degeneration, bilateral, intermediate dry stage: Secondary | ICD-10-CM

## 2022-12-22 DIAGNOSIS — M170 Bilateral primary osteoarthritis of knee: Secondary | ICD-10-CM

## 2022-12-22 DIAGNOSIS — F3341 Major depressive disorder, recurrent, in partial remission: Secondary | ICD-10-CM

## 2022-12-22 DIAGNOSIS — M797 Fibromyalgia: Secondary | ICD-10-CM | POA: Diagnosis not present

## 2022-12-22 DIAGNOSIS — Z79899 Other long term (current) drug therapy: Secondary | ICD-10-CM

## 2022-12-22 DIAGNOSIS — N183 Chronic kidney disease, stage 3 unspecified: Secondary | ICD-10-CM

## 2022-12-22 DIAGNOSIS — R296 Repeated falls: Secondary | ICD-10-CM

## 2022-12-22 DIAGNOSIS — E559 Vitamin D deficiency, unspecified: Secondary | ICD-10-CM | POA: Diagnosis not present

## 2022-12-22 DIAGNOSIS — Z6822 Body mass index (BMI) 22.0-22.9, adult: Secondary | ICD-10-CM

## 2022-12-22 DIAGNOSIS — K219 Gastro-esophageal reflux disease without esophagitis: Secondary | ICD-10-CM

## 2022-12-22 DIAGNOSIS — K589 Irritable bowel syndrome without diarrhea: Secondary | ICD-10-CM

## 2022-12-22 DIAGNOSIS — M5136 Other intervertebral disc degeneration, lumbar region: Secondary | ICD-10-CM

## 2022-12-22 DIAGNOSIS — G4700 Insomnia, unspecified: Secondary | ICD-10-CM | POA: Diagnosis not present

## 2022-12-22 DIAGNOSIS — N182 Chronic kidney disease, stage 2 (mild): Secondary | ICD-10-CM

## 2022-12-22 DIAGNOSIS — Z0001 Encounter for general adult medical examination with abnormal findings: Secondary | ICD-10-CM | POA: Diagnosis not present

## 2022-12-22 DIAGNOSIS — R6889 Other general symptoms and signs: Secondary | ICD-10-CM | POA: Diagnosis not present

## 2022-12-22 DIAGNOSIS — N3281 Overactive bladder: Secondary | ICD-10-CM

## 2022-12-22 DIAGNOSIS — C8590 Non-Hodgkin lymphoma, unspecified, unspecified site: Secondary | ICD-10-CM | POA: Diagnosis not present

## 2022-12-22 DIAGNOSIS — M85831 Other specified disorders of bone density and structure, right forearm: Secondary | ICD-10-CM

## 2022-12-22 NOTE — Patient Instructions (Signed)

## 2022-12-22 NOTE — Progress Notes (Signed)
MEDICARE ANNUAL WELLNESS VISIT AND FOLLOW UP  Assessment:   Encounter for Medicare annual wellness exam Due annually  Health maintenance reviewed Healthily lifestyle goals set  Atherosclerosis of aorta (HCC) Per CXR 2019  Control blood pressure, cholesterol, glucose, increase exercise.   Essential hypertension Discussed DASH (Dietary Approaches to Stop Hypertension) DASH diet is lower in sodium than a typical American diet. Cut back on foods that are high in saturated fat, cholesterol, and trans fats. Eat more whole-grain foods, fish, poultry, and nuts Remain active and exercise as tolerated daily.  Monitor BP at home-Call if greater than 130/80.  Check CMP/CBC  Type 2 diabetes mellitus with stage 2 chronic kidney disease, without long-term current use of insulin [facility]) Education: Reviewed 'ABCs' of diabetes management  Discussed goals to be met and/or maintained include A1C (<7) Blood pressure (<130/80) Cholesterol (LDL <70) Continue Eye Exam yearly  Continue Dental Exam Q6 mo Discussed dietary recommendations Discussed Physical Activity recommendations Check A1C  Mixed hyperlipidemia associated with T2DM (HCC) Discussed lifestyle modifications. Recommended diet heavy in fruits and veggies, omega 3's. Decrease consumption of animal meats, cheeses, and dairy products. Remain active and exercise as tolerated. Continue to monitor. Check lipids/TSH   CKD II assocaited with T2DM (HCC) Discussed how what you eat and drink can aide in kidney protection. Stay well hydrated. Avoid high salt foods. Avoid NSAIDS. Keep BP and BG well controlled.   Take medications as prescribed. Remain active and exercise as tolerated daily. Maintain weight.  Continue to monitor. Check CMP/GFR/Microablumin  Degenerative disc disease, lumbar Followed by Dr. Corliss Skains   Primary osteoarthritis of both knees Followed by Dr. Corliss Skains  Non-Hodgkin's lymphoma, unspecified body region,  unspecified non-Hodgkin lymphoma type (HCC) Monitor symptoms, in remission, released by Dr. Darnelle Catalan  Depression, major, recurrent, in partial remission (HCC) Continue prozac, formerly followed with psych Discussed risks with benzo Lifestyle discussed: diet/exerise, sleep hygiene, stress management, hydration  Insomnia Excess sedation with gabapentin 300 mg, mirtazepine 15 mg Discussed good sleep hygiene. Establish bed and wake times. Sleep restriction-only sleep estimated hrs sleep. Bed only for sleep, only sleep when sleepy, out of bed if anxious (stimulus control). Reviewed relaxation techniques, mindful meditations. Expected sleep duration. Addressed worries about not sleeping.   Fibromyalgia syndrome Continue medications, follow up with Dr. Corliss Skains  Medication management All medications discussed and reviewed in full. All questions and concerns regarding medications addressed.    Vitamin D deficiency Continue supplement for goal of 60-100 Monitor Vitamin D levels  Gastroesophageal reflux disease, esophagitis presence not specified Continue Nexium - add Pepcid before dinner. No suspected reflux complications (Barret/stricture). Lifestyle modification:  wt loss, avoid meals 2-3h before bedtime. Consider eliminating food triggers:  chocolate, caffeine, EtOH, acid/spicy food.   Irritable bowel syndrome, unspecified type  Recently fairly controlled; add soluble fiber if needed Stay well hydrated Remain active Avoid triggers  Osteopenia Due - ordered  Pursue a combination of weight-bearing exercises and strength training. Patients with severe mobility impairment should be referred for physical therapy. Advised on fall prevention measures including proper lighting in all rooms, removal of area rugs and floor clutter, use of walking devices as deemed appropriate, avoidance of uneven walking surfaces. Smoking cessation and moderate alcohol consumption if  applicable Consume 800 to 1000 IU of vitamin D daily with a goal vitamin D serum value of 30 ng/mL or higher. Aim for 1000 to 1200 mg of elemental calcium daily through supplements and/or dietary sources.  OAB Wears pull ups Discussed hygiene Continue to monitor  High risk for falls  Encouraged regular use of walker Contact guard Change positions slowly when moving   BMI 22 Discussed appropriate BMI Diet modification. Physical activity. Encouraged/praised to build confidence.  Macular degeneration Continue to follow with Dr. Cathey Endow Continue nasal spray samples from Opthalmology Discussed possible assistance form if unable to afford.  Orders Placed This Encounter  Procedures   DG Bone Density    Standing Status:   Future    Standing Expiration Date:   12/22/2023    Order Specific Question:   Reason for Exam (SYMPTOM  OR DIAGNOSIS REQUIRED)    Answer:   Osteopenia    Order Specific Question:   Preferred imaging location?    Answer:   [facility]   CBC with Differential/Platelet   COMPLETE METABOLIC PANEL WITH GFR   Lipid panel   Hemoglobin A1C w/out eAG    Notify office for further evaluation and treatment, questions or concerns if any reported s/s fail to improve.   The patient was advised to call back or seek an in-person evaluation if any symptoms worsen or if the condition fails to improve as anticipated.   Further disposition pending results of labs. Discussed med's effects and SE's.    I discussed the assessment and treatment plan with the patient. The patient was provided an opportunity to ask questions and all were answered. The patient agreed with the plan and demonstrated an understanding of the instructions.  Discussed med's effects and SE's. Screening labs and tests as requested with regular follow-up as recommended.  I provided 35 minutes of face-to-face time during this encounter including counseling, chart review, and critical decision making was  preformed.  Today's Plan of Care is based on a patient-centered health care approach known as shared decision making - the decisions, tests and treatments allow for patient preferences and values to be balanced with clinical evidence.     Future Appointments  Date Time Provider Department Center  02/28/2023  2:00 PM [name], NP [facility] None  05/31/2023  2:30 PM Lucky Cowboy, MD [facility] None  12/25/2023  2:30 PM [name], NP [facility] None    Plan:   During the course of the visit the patient was educated and counseled about appropriate screening and preventive services including:   Pneumococcal vaccine  Influenza vaccine Td vaccine Prevnar 13 Screening electrocardiogram Screening mammography Bone densitometry screening Colorectal cancer screening Diabetes screening Glaucoma screening Nutrition counseling  Advanced directives: given info/requested copies   Subjective:   Debra Short is a 82 y.o. female who presents for Medicare Annual Wellness Visit and 3 month follow up. She has Hyperlipidemia associated with type 2 diabetes mellitus (HCC); Essential hypertension; Gastroesophageal reflux disease; Vitamin D deficiency; IBS (irritable bowel syndrome); Type 2 diabetes mellitus (HCC); Non Hodgkin's lymphoma (HCC); Medication management; Insomnia; CKD stage 3 due to type 2 diabetes mellitus (HCC); Degenerative disc disease, lumbar; Primary osteoarthritis of both knees; Fibromyalgia syndrome; Depression, major, recurrent, in partial remission (HCC); BMI 22.0-22.9, adult; OAB (overactive bladder); Osteopenia; Aortic atherosclerosis (HCC) by CXR in 2019; Nervous; Frequent falls; and Unsteady gait on their problem list.  Overall she reports feeling well today.  She reports at times with her daughter Jasmine December who is not present today.  Jasmine December is her caregiver who checks on her 4 days a week, can call her or son (in [place]) if needed. She lives by herself. Jasmine December  helps with housework and driving when needed.   Has recently seen Dr. Cathey Endow, Opthalmology and  reports hx of macular degeneration, bilateral dry eyes.  Shew as provided a sample of a nasal spray but unable to afford.    Non-hodgkin's was treated by Dr. Darnelle Catalan, in remission.  She has a h/o falls - had a Subdural Hematoma on 11/02/2021 and was discharged home 2 days later and after several ER visits for falls,  she was finally held til transferred to [facility] on 11/16/2020  Then on 6/30 a the NH, she slipped in the shower sustaining a laceration of her posterior scalp requiring stapling.  Head & Cx CT scans found no acute pathology.   She was discharged from NH back home on 12/11/2020. She did complete home PT for balance. She reports slowed cognition since fall. She uses walker intermittently, denies fall since returning home but has had near fall.   She has been s on prozac 40 mg daily for depression/anxiety for many years, PRN xanax, was seeing psych but hasn't followed up recently. She reports takes 1/4-1/2 tab PRN and estimates 1 month supply lasts 4 months.   She has been given gabapentin 300 mg, more recently mirtazepine 15 mg tabs for sleep and also concern with weight loss, stopped both, reports "hung over" and very sedated late into the next day.   Patient has a long standing history of fibromyalgia syndrome followed by Dr Corliss Skains and previously prescribed hydrocodone but this was d/c'd related to concerns with falling. She denies pain at this time.   IBS managed by lifestyle  BMI is Body mass index is 24.5 kg/m., she has been working on diet, exercise is limited.  Wt Readings from Last 3 Encounters:  12/22/22 147 lb 3.2 oz (66.8 kg)  08/16/22 143 lb 6.4 oz (65 kg)  05/17/22 146 lb 6.4 oz (66.4 kg)   Has aortic atherosclerosis per CXR 2019  Her blood pressure has been controlled at home, today their BP is BP: 110/70 Not on ACE/ARB due to low BPs She does not workout. She denies  chest pain, shortness of breath, dizziness. She will occasionally get some walking in around the house or do stretches but is not formally exercising.    She is not currently on cholesterol medication and denies myalgias. Her cholesterol is at goal. The cholesterol last visit was:   Lab Results  Component Value Date   CHOL 227 (H) 08/16/2022   HDL 50 08/16/2022   LDLCALC 139 (H) 08/16/2022   TRIG 250 (H) 08/16/2022   CHOLHDL 4.5 08/16/2022   She has been working on diet/exercise for T2DM well controlled on metformin, most recently back into normal range. She has been taking metformin takes 2-3 tabs daily depending on diet. She denies hypoglycemia, extremity paraesthesias, polydipsia, polyuria, blurry vision. She admits she does not check fasting glucose.  Lab Results  Component Value Date   HGBA1C 5.7 (H) 08/16/2022   CKD II associated with T2DM monitored at this office:  Lab Results  Component Value Date   GFRNONAA >60 07/05/2021   Patient is on Vitamin D supplement. Lab Results  Component Value Date   VD25OH 72 08/16/2022          Medication Review Current Outpatient Medications on File Prior to Visit  Medication Sig Dispense Refill   acetaminophen (TYLENOL) 650 MG CR tablet Take 650 mg by mouth See admin instructions. Take 650 mg by mouth at bedtime and an additional 650 mg during the day as needed for pain     ALPRAZolam (XANAX) 1 MG tablet TAKE 1/2-1  TABLET 2-3 TIMES DAILY ONLY IF NEEDED FOR ANXIETY ATACK LIMIT 5 DAYS PER WEEK TO AVOID ADDICTION & DEMENTIA 60 tablet 0   esomeprazole (NEXIUM) 40 MG capsule TAKE 1 CAPSULE DAILY TO PREVENT HEARTBURN & INDIGESTION 90 capsule 33   FLUoxetine (PROZAC) 40 MG capsule TAKE 1 CAPSULE DAILY FOR MOOD 90 capsule 3   fluticasone (FLONASE) 50 MCG/ACT nasal spray Place 1-2 sprays into both nostrils daily as needed for allergies or rhinitis. 48 mL 4   metFORMIN (GLUCOPHAGE-XR) 500 MG 24 hr tablet TAKE 2 TABLETS BY MOUTH 2 X /DAY WITH MEALS  FOR DIABETES 360 tablet 1   Multiple Vitamins-Minerals (ICAPS) CAPS Take 1 capsule by mouth daily with lunch.     Multiple Vitamins-Minerals (ONE-A-DAY PROACTIVE 65+) TABS Take 1 tablet by mouth daily with breakfast.     Propylene Glycol (SYSTANE BALANCE OP) Place 1 drop into both eyes 4 (four) times daily as needed (dry eyes).     rosuvastatin (CRESTOR) 20 MG tablet Take 1 tablet Daily for Cholesterol 90 tablet 3   sodium chloride (MURO 128) 5 % ophthalmic solution Place 1 drop into both eyes in the morning, at noon, and at bedtime.     solifenacin (VESICARE) 10 MG tablet Take 1 tab daily for over active bladder. 90 tablet 3   traZODone (DESYREL) 50 MG tablet TAKE 1/2-1 TABLET BY MOUTH FOR SLEEP. 90 tablet 1   Cholecalciferol (VITAMIN D-3 PO) Take 1-2 capsules by mouth daily. 2000 iu per day (Patient not taking: Reported on 12/22/2022)     No current facility-administered medications on file prior to visit.    Allergies: Allergies  Allergen Reactions   Effexor [Venlafaxine] Other (See Comments)    Not effective   Nortriptyline Other (See Comments)    Not effective   Other Other (See Comments)    Spicy foods cause heartburn   Paxil [Paroxetine Hcl] Other (See Comments)    Not effective   Robaxin [Methocarbamol] Itching   Tizanidine Itching   Zoloft [Sertraline Hcl] Other (See Comments)    Not effective    Current Problems (verified) has Hyperlipidemia associated with type 2 diabetes mellitus (HCC); Essential hypertension; Gastroesophageal reflux disease; Vitamin D deficiency; IBS (irritable bowel syndrome); Type 2 diabetes mellitus (HCC); Non Hodgkin's lymphoma (HCC); Medication management; Insomnia; CKD stage 3 due to type 2 diabetes mellitus (HCC); Degenerative disc disease, lumbar; Primary osteoarthritis of both knees; Fibromyalgia syndrome; Depression, major, recurrent, in partial remission (HCC); BMI 22.0-22.9, adult; OAB (overactive bladder); Osteopenia; Aortic atherosclerosis  (HCC) by CXR in 2019; Nervous; Frequent falls; and Unsteady gait on their problem list.  Screening Tests Immunization History  Administered Date(s) Administered   Influenza Split 04/10/2013, 03/23/2016   Influenza, High Dose Seasonal PF 03/24/2014, 02/27/2015, 05/13/2021, 05/17/2022   Influenza-Unspecified 03/27/2018   Moderna Covid-19 Vaccine Bivalent Booster 71yrs & up 05/01/2021   Moderna Sars-Covid-2 Vaccination 07/26/2019, 08/23/2019, 04/21/2020   Pneumococcal Conjugate-13 03/24/2014   Pneumococcal Polysaccharide-23 06/13/2008   Td 09/24/2012   Tdap 09/26/2014, 09/25/2020, 11/02/2020   Zoster, Live 02/23/2011    Preventative care: Last colonoscopy: 2013, diverticulosis, never polyps, Dr. Randa Evens, declines further due to age, wants to do cologuard Last mammogram:  10/2017 - overdue, ordered to schedule DEXA 10/2017 - T -1.4 forearm , ordered to schedule  PAP: remote, DONE   Prior vaccinations: TD or Tdap: 2016, 2022    Influenza: 05/2021 Pneumococcal: 2010 Prevnar13: 2015 Shingles/Zostavax: 2012 Covid 19: 2/2, 2021, moderna + 04/2020 planning nother booster  Names of Other  Physician/Practitioners you currently use: 1. [facility]- here for primary care 2. [facility], Dr. Cathey Endow, eye doctor, 04/2021 3. Dr. Jonni Sanger, dentist, last visit 2021, due to schedule   Patient Care Team: Lucky Cowboy, MD as PCP - General (Internal Medicine) Pollyann Savoy, MD as Consulting Physician (Rheumatology) Carman Ching, MD (Inactive) as Consulting Physician (Gastroenterology) Nita Sells, MD (Dermatology) Lajoyce Corners, [facility] (Inactive) as Pharmacist (Pharmacist)  Surgical: She  has a past surgical history that includes ORIF ankle fracture (Left, 09/26/2014); Abdominal hysterectomy; Tonsillectomy; and ORIF ankle fracture (Left, 09/26/2014). Family Her family history includes Breast cancer in her sister; Cancer in her maternal grandfather;  Colon cancer in her mother; Liver disease (age of onset: 30) in her sister; Lung cancer in her brother and father. Social history  She reports that she has never smoked. She has never used smokeless tobacco. She reports that she does not drink alcohol and does not use drugs.  MEDICARE WELLNESS OBJECTIVES: Physical activity: Current Exercise Habits: Home exercise routine Cardiac risk factors:   Depression/mood screen:      12/22/2022    3:54 PM  Depression screen PHQ 2/9  Decreased Interest 0  Down, Depressed, Hopeless 0  PHQ - 2 Score 0    ADLs:     12/22/2022    3:54 PM 05/17/2022    3:15 PM  In your present state of health, do you have any difficulty performing the following activities:  Hearing? 0 1  Vision? 1 0  Difficulty concentrating or making decisions? 0 1  Walking or climbing stairs? 0 1  Dressing or bathing? 0 0  Doing errands, shopping? 0 1     Cognitive Testing  Alert? Yes  Normal Appearance?Yes  Oriented to person? Yes  Place? Yes   Time? Yes  Recall of three objects?  No, 1/3  Can perform simple calculations? Yes  Displays appropriate judgment?Yes  Can read the correct time from a watch face?Yes  EOL planning:     Objective:   Today's Vitals   12/22/22 1509  BP: 110/70  Pulse: 84  Temp: 97.9 F (36.6 C)  SpO2: 97%  Weight: 147 lb 3.2 oz (66.8 kg)  Height: 5\' 5"  (1.651 m)     Body mass index is 24.5 kg/m. Wt Readings from Last 3 Encounters:  12/22/22 147 lb 3.2 oz (66.8 kg)  08/16/22 143 lb 6.4 oz (65 kg)  05/17/22 146 lb 6.4 oz (66.4 kg)    General appearance: alert, no distress, WD/WN,  female HEENT: normocephalic, sclerae anicteric, TMs pearly, nares patent, no discharge or erythema, pharynx normal. R upper external nontender nodular lump without discharge Oral cavity: MMM, no lesions Neck: supple, no lymphadenopathy, no thyromegaly, normal thyroid Heart: RRR, normal S1, S2, no murmurs Lungs: CTA bilaterally, no wheezes, rhonchi, or  rales Abdomen: +bs, soft, non tender, non distended, no masses, no hepatomegaly, no splenomegaly Musculoskeletal: nontender, no swelling, no obvious deformity Extremities: no edema, no cyanosis, no clubbing Pulses: 2+ symmetric, upper and lower extremities, normal cap refill Neurological: alert, oriented x 3, CN2-12 intact, 4/5 strength throughput, sensation normal throughout, DTRs 2+ throughout, no cerebellar signs, gait slow and unsteady Psychiatric: normal affect, behavior normal, pleasant   Medicare Attestation I have personally reviewed: The patient's medical and social history Their use of alcohol, tobacco or illicit drugs Their current medications and supplements The patient's functional ability including ADLs,fall risks, home safety risks, cognitive, and hearing and visual impairment Diet and physical activities Evidence for depression  or mood disorders  The patient's weight, height, BMI, and visual acuity have been recorded in the chart.  I have made referrals, counseling, and provided education to the patient based on review of the above and I have provided the patient with a written personalized care plan for preventive services.     Adela Glimpse, NP   12/22/2022

## 2022-12-23 LAB — LIPID PANEL
Cholesterol: 265 mg/dL — ABNORMAL HIGH (ref <200)
HDL: 46 mg/dL — ABNORMAL LOW (ref >=50)
LDL Cholesterol (Calc): 180 mg/dL (calc) — ABNORMAL HIGH
Non-HDL Cholesterol (Calc): 219 mg/dL (calc) — ABNORMAL HIGH (ref <130)
Total CHOL/HDL Ratio: 5.8 (calc) — ABNORMAL HIGH (ref <5.0)
Triglycerides: 216 mg/dL — ABNORMAL HIGH (ref <150)

## 2022-12-23 LAB — COMPLETE METABOLIC PANEL WITH GFR
AG Ratio: 1.7 (calc) (ref 1.0–2.5)
ALT: 11 U/L (ref 6–29)
AST: 16 U/L (ref 10–35)
Albumin: 4.5 g/dL (ref 3.6–5.1)
Alkaline phosphatase (APISO): 63 U/L (ref 37–153)
BUN/Creatinine Ratio: 23 (calc) — ABNORMAL HIGH (ref 6–22)
BUN: 22 mg/dL (ref 7–25)
CO2: 30 mmol/L (ref 20–32)
Calcium: 10.1 mg/dL (ref 8.6–10.4)
Chloride: 99 mmol/L (ref 98–110)
Creat: 0.97 mg/dL — ABNORMAL HIGH (ref 0.60–0.95)
Globulin: 2.7 g/dL (calc) (ref 1.9–3.7)
Glucose, Bld: 87 mg/dL (ref 65–99)
Potassium: 5.2 mmol/L (ref 3.5–5.3)
Sodium: 138 mmol/L (ref 135–146)
Total Bilirubin: 0.9 mg/dL (ref 0.2–1.2)
Total Protein: 7.2 g/dL (ref 6.1–8.1)
eGFR: 58 mL/min/{1.73_m2} — ABNORMAL LOW (ref >=60)

## 2022-12-23 LAB — CBC WITH DIFFERENTIAL/PLATELET
Absolute Monocytes: 564 cells/uL (ref 200–950)
Basophils Absolute: 30 cells/uL (ref 0–200)
Basophils Relative: 0.5 %
Eosinophils Absolute: 102 cells/uL (ref 15–500)
Eosinophils Relative: 1.7 %
HCT: 39 % (ref 35.0–45.0)
Hemoglobin: 12.8 g/dL (ref 11.7–15.5)
Lymphs Abs: 1548 cells/uL (ref 850–3900)
MCH: 30.8 pg (ref 27.0–33.0)
MCHC: 32.8 g/dL (ref 32.0–36.0)
MCV: 93.8 fL (ref 80.0–100.0)
MPV: 9.2 fL (ref 7.5–12.5)
Monocytes Relative: 9.4 %
Neutro Abs: 3756 cells/uL (ref 1500–7800)
Neutrophils Relative %: 62.6 %
Platelets: 287 10*3/uL (ref 140–400)
RBC: 4.16 10*6/uL (ref 3.80–5.10)
RDW: 12.9 % (ref 11.0–15.0)
Total Lymphocyte: 25.8 %
WBC: 6 10*3/uL (ref 3.8–10.8)

## 2022-12-23 LAB — HEMOGLOBIN A1C W/OUT EAG: Hgb A1c MFr Bld: 5.7 % of total Hgb — ABNORMAL HIGH (ref <5.7)

## 2022-12-30 ENCOUNTER — Other Ambulatory Visit: Payer: Self-pay | Admitting: Nurse Practitioner

## 2022-12-30 DIAGNOSIS — F411 Generalized anxiety disorder: Secondary | ICD-10-CM

## 2023-02-16 ENCOUNTER — Ambulatory Visit: Payer: Medicare Other | Admitting: Nurse Practitioner

## 2023-02-28 ENCOUNTER — Encounter: Payer: Medicare Other | Admitting: Nurse Practitioner

## 2023-03-24 NOTE — Progress Notes (Signed)
Encounter for Annual Physical Exam   Assessment:   Encounter for Annual Physical Exam with abnormal findings Due annually  Health Maintenance reviewed Healthy lifestyle reviewed and goals set Last mammogram and DEXA 2019- declines further Discussed end of life in detail and patient has decided she wants to be DNR- form completed today to have one here and one at home   Atherosclerosis of aorta (HCC) - Per CXR 2019  Control blood pressure, cholesterol, glucose, increase exercise.   Essential hypertension - continue medications, DASH diet, exercise and monitor at home. Call if greater than 130/80.  -     CBC with Differential/Platelet -     CMP/GFR -     TSH  Type 2 diabetes mellitus with stage 3 chronic kidney disease, without long-term current use of insulin (HCC)       Continue Metformin 500 mg 2 tabs BID- sometimes only take 2 Discussed general issues about diabetes pathophysiology and management., Educational material distributed., Suggested low cholesterol diet., Encouraged aerobic exercise., Discussed foot care., Reminded to get yearly retinal exam. -     Hemoglobin A1c  Mixed hyperlipidemia associated with T2DM (HCC) - at LDL goal <70 off of medication   - check lipids, decrease fatty foods, increase activity.  -     Lipid panel  CKD III assocaited with T2DM (HCC) Increase fluids, avoid NSAIDS, monitor sugars, will monitor - Routine UA with reflex microscopic - Microalbumin/creatinine urine ratio  Degenerative disc disease, lumbar Schedule appointment for follow up with Dr. Corliss Skains   Primary osteoarthritis of both knees Schedule appointment for follow up with Dr. Corliss Skains   Non-Hodgkin's lymphoma, unspecified body region, unspecified non-Hodgkin lymphoma type (HCC) Monitor symptoms, in remission, released by Dr. Darnelle Catalan  Fibromyalgia syndrome Continue medications, follow up with Dr. Corliss Skains  Medication management -     Magnesium  Vitamin D  deficiency Continue supplement  Gastroesophageal reflux disease, esophagitis presence not specified Continue PPI/H2 blocker, diet discussed  Irritable bowel syndrome, unspecified type  Recently fairly controlled; add soluble fiber if needed  Osteopenia Repeat DEXA due 2021- DEXA pending - patient states intends to schedule, ordered 12/22/22 continue Vit D and Ca, weight bearing exercises  OAB Wears pull ups, vesicare refilled per patient request  Depression, major, recurrent, in partial remission (HCC) Decrease Prozac to 20 mg every day  She has been off for several days; encouraged she reduce use to rare PRN only if possible  Lifestyle discussed: diet/exerise, sleep hygiene, stress management, hydration  BMI 25  Monitor; encourage heathy diet and regular exercise  Insomnia Discussed risks with benzo, she reports taking 0.5 mg once a day.Attempt to taper off of xanax  Never tried Trazodone  Follow up if any SE.   High risk for falls  Has completed in home PT, continues with daily exercises Encouraged regular use of walker Continue to reduce xanax use  Screening for thyroid disease - TSH  Screening for ischemic heart disease - EKG  Screening for AAA - U/S ABD Retroperitoneal LTD  Flu Vaccine need High dose flu vaccine given    Over 30 minutes of exam, counseling, chart review, and critical decision making was performed  Future Appointments  Date Time Provider Department Center  06/29/2023  2:30 PM Lucky Cowboy, MD [facility] None  12/25/2023  2:30 PM [name], NP [facility] None  03/27/2024  3:00 PM [name], NP [facility] None      Subjective:   MAKHAYLA MCMURRY is a 82 y.o. female who  presents for CPE and 3 month follow up. She has Hyperlipidemia associated with type 2 diabetes mellitus (HCC); Essential hypertension; Gastroesophageal reflux disease; Vitamin D deficiency; IBS (irritable bowel syndrome); Type 2 diabetes mellitus (HCC);  Non Hodgkin's lymphoma (HCC); Medication management; Insomnia; CKD stage 3 due to type 2 diabetes mellitus (HCC); Degenerative disc disease, lumbar; Primary osteoarthritis of both knees; Fibromyalgia syndrome; Depression, major, recurrent, in partial remission (HCC); BMI 22.0-22.9, adult; OAB (overactive bladder); Osteopenia; Aortic atherosclerosis (HCC) by CXR in 2019; Nervous; Frequent falls; and Unsteady gait on their problem list.Non-hodgkin's was treated by Dr. Darnelle Catalan, in remission.   She lives by herself, can call her son if needed for help. She feels is currently managing without help in her home. She does drive short distances and to Dr's office only if needed. Grocery and [facility] are very close by.   Hx/o falls - several ED visits in 2022 and had a Subdural Hematoma on Nov 02 2020. She did complete home PT for balance. She has been using walker intermittently, reports did have a fall this past year - tried to reach for her walker and it slipped out of reach and toppled over. She has been working on balance exercises at home, starting a walking program, declined PT referral at this time.   She has been on prozac 40 mg daily for depression/anxiety for many years, PRN xanax, was seeing psych but hasn't followed up recently.  She is prescribed xanax 1 mg tabs, reports takes 1/2 tab PRN at HS. Trazodone was prescribed last visit but admits never tried. Last filled Alprazolam 1 mg #55 12/30/22. Will continue to wean off benzodiazepine due to side effects. Difficulty falling asleep due to pain since pain medications were stopped.   She does note bladder wakes her up, has seen uro and tried several meds,now only gets up once a night to urinate.   BP well controlled without medication. She notes sometimes her heart races when she first wakes up in the morning.  BP Readings from Last 3 Encounters:  03/27/23 100/68  12/22/22 110/70  08/16/22 136/84  Denies headaches, chest pain, shortness of breath and  dizziness   Patient has a long standing history of fibromyalgia syndrome followed by Dr Corliss Skains and previously prescribed hydrocodone but this was d/c'd related to concerns with falling. She denies pain at this time.  She has pain in both knee and right shoulder and is to have replacement but does not wish to have surgery.  Does plan to make follow up appointment with Dr. Corliss Skains.   IBS managed by lifestyle, dietary changes.   GERD is well controlled with use of Nexium in the am and pepcid at night.   BMI is Body mass index is 25.61 kg/m., she has been working on diet, exercise is limited.  Has been doing strengthening and balance exercises, 5-10 min daily.  She does report good appetite, eating 2-3 full meals daily. She is vegetarian reports eats 2 full bags of walnuts/week.  Wt Readings from Last 3 Encounters:  03/27/23 149 lb 3.2 oz (67.7 kg)  12/22/22 147 lb 3.2 oz (66.8 kg)  08/16/22 143 lb 6.4 oz (65 kg)   Has aortic atherosclerosis per CXR 2019  Her blood pressure has been controlled at home, today their BP is BP: 100/68 Not on ACE/ARB due to low BPs She does not workout. She denies chest pain, shortness of breath, dizziness. She will occasionally get some walking in around the house or do stretches but is  not formally exercising.    She is not currently on cholesterol medication and denies myalgias. Her cholesterol is not at goal LDL <70. She has not taken Rosuvastatin 20 mg x 3 months, having myalgias- worsens fibromyalgia.  The cholesterol last visit was:   Lab Results  Component Value Date   CHOL 265 (H) 12/22/2022   HDL 46 (L) 12/22/2022   LDLCALC 180 (H) 12/22/2022   TRIG 216 (H) 12/22/2022   CHOLHDL 5.8 (H) 12/22/2022   She has been working on diet/exercise for T2DM well controlled on metformin, most recently back into normal range. She has been taking metformin takes 2-4 tabs daily depending on diet. She denies hypoglycemia, extremity paraesthesias, polydipsia,  polyuria, blurry vision. She admits she does not check fasting glucose.  Lab Results  Component Value Date   HGBA1C 5.7 (H) 12/22/2022   She is trying to drink more water. CKD newly progressed to stage III range, associated with T2DM monitored at this office:  Lab Results  Component Value Date   EGFR 58 (L) 12/22/2022   EGFR 63 08/16/2022   EGFR 74 05/17/2022   Lab Results  Component Value Date   MICRALBCREAT 20 11/11/2021   Patient is on Vitamin D supplement. Lab Results  Component Value Date   VD25OH 72 08/16/2022         Medication Review Current Outpatient Medications on File Prior to Visit  Medication Sig Dispense Refill   acetaminophen (TYLENOL) 650 MG CR tablet Take 650 mg by mouth See admin instructions. Take 650 mg by mouth at bedtime and an additional 650 mg during the day as needed for pain     ALPRAZolam (XANAX) 1 MG tablet TAKE 1/2-1 TABLET 2-3 TIMES DAILY ONLY IF NEEDED FOR ANXIETY ATACK LIMIT 5 DAYS PER WEEK TO AVOID ADDICTION & DEMENTIA 55 tablet 0   Cholecalciferol (VITAMIN D-3 PO) Take 1-2 capsules by mouth daily. 2000 iu per day     esomeprazole (NEXIUM) 40 MG capsule TAKE 1 CAPSULE DAILY TO PREVENT HEARTBURN & INDIGESTION 90 capsule 33   FLUoxetine (PROZAC) 40 MG capsule TAKE 1 CAPSULE DAILY FOR MOOD 90 capsule 3   fluticasone (FLONASE) 50 MCG/ACT nasal spray Place 1-2 sprays into both nostrils daily as needed for allergies or rhinitis. 48 mL 4   metFORMIN (GLUCOPHAGE-XR) 500 MG 24 hr tablet TAKE 2 TABLETS BY MOUTH 2 X /DAY WITH MEALS FOR DIABETES 360 tablet 1   Multiple Vitamins-Minerals (ONE-A-DAY PROACTIVE 65+) TABS Take 1 tablet by mouth daily with breakfast.     Propylene Glycol (SYSTANE BALANCE OP) Place 1 drop into both eyes 4 (four) times daily as needed (dry eyes).     Multiple Vitamins-Minerals (ICAPS) CAPS Take 1 capsule by mouth daily with lunch. (Patient not taking: Reported on 03/27/2023)     rosuvastatin (CRESTOR) 20 MG tablet Take 1 tablet  Daily for Cholesterol (Patient not taking: Reported on 03/27/2023) 90 tablet 3   sodium chloride (MURO 128) 5 % ophthalmic solution Place 1 drop into both eyes in the morning, at noon, and at bedtime. (Patient not taking: Reported on 03/27/2023)     solifenacin (VESICARE) 10 MG tablet Take 1 tab daily for over active bladder. (Patient not taking: Reported on 03/27/2023) 90 tablet 3   traZODone (DESYREL) 50 MG tablet TAKE 1/2-1 TABLET BY MOUTH FOR SLEEP. (Patient not taking: Reported on 03/27/2023) 90 tablet 1   No current facility-administered medications on file prior to visit.    Allergies: Allergies  Allergen Reactions  Effexor [Venlafaxine] Other (See Comments)    Not effective   Nortriptyline Other (See Comments)    Not effective   Other Other (See Comments)    Spicy foods cause heartburn   Paxil [Paroxetine Hcl] Other (See Comments)    Not effective   Robaxin [Methocarbamol] Itching   Tizanidine Itching   Zoloft [Sertraline Hcl] Other (See Comments)    Not effective    Current Problems (verified) has Hyperlipidemia associated with type 2 diabetes mellitus (HCC); Essential hypertension; Gastroesophageal reflux disease; Vitamin D deficiency; IBS (irritable bowel syndrome); Type 2 diabetes mellitus (HCC); Non Hodgkin's lymphoma (HCC); Medication management; Insomnia; CKD stage 3 due to type 2 diabetes mellitus (HCC); Degenerative disc disease, lumbar; Primary osteoarthritis of both knees; Fibromyalgia syndrome; Depression, major, recurrent, in partial remission (HCC); BMI 22.0-22.9, adult; OAB (overactive bladder); Osteopenia; Aortic atherosclerosis (HCC) by CXR in 2019; Nervous; Frequent falls; and Unsteady gait on their problem list.  Screening Tests Immunization History  Administered Date(s) Administered   Influenza Split 04/10/2013, 03/23/2016   Influenza, High Dose Seasonal PF 03/24/2014, 02/27/2015, 05/13/2021, 05/17/2022   Influenza-Unspecified 03/27/2018   Moderna  Covid-19 Vaccine Bivalent Booster 80yrs & up 05/01/2021   Moderna Sars-Covid-2 Vaccination 07/26/2019, 08/23/2019, 04/21/2020   Pneumococcal Conjugate-13 03/24/2014   Pneumococcal Polysaccharide-23 06/13/2008   Td 09/24/2012   Tdap 09/26/2014, 09/25/2020, 11/02/2020   Zoster, Live 02/23/2011   Health Maintenance  Topic Date Due   Zoster Vaccines- Shingrix (1 of 2) 09/19/1959   Diabetic kidney evaluation - Urine ACR  11/12/2022   INFLUENZA VACCINE  01/12/2023   COVID-19 Vaccine (5 - 2023-24 season) 02/12/2023   DEXA SCAN  05/18/2023 (Originally 11/09/2019)   FOOT EXAM  05/18/2023   HEMOGLOBIN A1C  06/24/2023   OPHTHALMOLOGY EXAM  08/02/2023   Diabetic kidney evaluation - eGFR measurement  12/22/2023   Medicare Annual Wellness (AWV)  12/22/2023   DTaP/Tdap/Td (5 - Td or Tdap) 11/03/2030   Pneumonia Vaccine 36+ Years old  Completed   HPV VACCINES  Aged Out     Names of Other Physician/Practitioners you currently use: 1. [facility]- here for primary care 2. [facility], Dr. Cathey Endow, eye doctor, 08/01/22 - reports requested  3. Dr. Jonni Sanger, dentist, last visit 2021, due to schedule   Patient Care Team: Lucky Cowboy, MD as PCP - General (Internal Medicine) Pollyann Savoy, MD as Consulting Physician (Rheumatology) Carman Ching, MD (Inactive) as Consulting Physician (Gastroenterology) Nita Sells, MD (Dermatology) Lajoyce Corners, [facility] (Inactive) as Pharmacist (Pharmacist)  Surgical: She  has a past surgical history that includes ORIF ankle fracture (Left, 09/26/2014); Abdominal hysterectomy; Tonsillectomy; and ORIF ankle fracture (Left, 09/26/2014). Family Her family history includes Breast cancer in her sister; Cancer in her maternal grandfather; Colon cancer in her mother; Liver disease (age of onset: 45) in her sister; Lung cancer in her brother and father. Social history  She reports that she has never smoked. She has never used  smokeless tobacco. She reports that she does not drink alcohol and does not use drugs.   Review of Systems  Constitutional:  Negative for malaise/fatigue and weight loss.  HENT:  Positive for hearing loss (progressive she plans to reach out to audiology). Negative for tinnitus.   Eyes:  Negative for blurred vision and double vision.  Respiratory:  Negative for cough, shortness of breath and wheezing.   Cardiovascular:  Negative for chest pain, palpitations, orthopnea, claudication and leg swelling.  Gastrointestinal:  Positive for heartburn. Negative for abdominal pain, blood  in stool, constipation, diarrhea, melena, nausea and vomiting.  Genitourinary: Negative.   Musculoskeletal:  Positive for back pain, joint pain (knees, right shoulder, hands) and myalgias. Negative for falls.  Skin:  Negative for rash.  Neurological:  Negative for dizziness, tingling, sensory change, weakness and headaches.  Endo/Heme/Allergies:  Negative for polydipsia.  Psychiatric/Behavioral:  Positive for depression (controlled). Negative for substance abuse and suicidal ideas. The patient has insomnia (controlled with med). The patient is not nervous/anxious.   All other systems reviewed and are negative.    Objective:   Today's Vitals   03/27/23 1504  BP: 100/68  Pulse: 92  Temp: 97.9 F (36.6 C)  SpO2: 96%  Weight: 149 lb 3.2 oz (67.7 kg)  Height: 5\' 4"  (1.626 m)   General appearance: alert, no distress, WD/WN, frail appearing female HEENT: normocephalic, sclerae anicteric, TMs pearly, nares patent, no discharge or erythema, pharynx normal. Mildly HOH.  Oral cavity: MMM, no lesions Neck: supple, no lymphadenopathy, no thyromegaly, normal thyroid Heart: RRR, normal S1, S2, no murmurs Lungs: CTA bilaterally, no wheezes, rhonchi, or rales Abdomen: +bs, soft, non tender, non distended, no masses, no hepatomegaly, no splenomegaly Musculoskeletal: nontender, no swelling; chronic displaced mid shaft left  clavicle fracture. R shoulder with limited ROM (abduction, rotation).  Extremities: no edema, no cyanosis, no clubbing Pulses: 2+ symmetric, upper and lower extremities, normal cap refill Neurological: alert, oriented x 3, CN2-12 intact, 4/5 strength throughput, sensation normal throughout, DTRs 2+ throughout, no cerebellar signs, gait slow and unsteady Psychiatric: normal affect, behavior normal, pleasant  Breasts: breasts appear normal, no suspicious masses, no skin or nipple changes or axillary nodes, well healed surgical scars bil (hx of implant removal). GU: defer  EKG: NSR, no ST changes AAA: < 3 cm  [name], NP   03/27/2023

## 2023-03-27 ENCOUNTER — Ambulatory Visit (INDEPENDENT_AMBULATORY_CARE_PROVIDER_SITE_OTHER): Payer: Medicare Other | Admitting: Nurse Practitioner

## 2023-03-27 ENCOUNTER — Encounter: Payer: Self-pay | Admitting: Nurse Practitioner

## 2023-03-27 VITALS — BP 100/68 | HR 92 | Temp 97.9°F | Ht 64.0 in | Wt 149.2 lb

## 2023-03-27 DIAGNOSIS — Z0000 Encounter for general adult medical examination without abnormal findings: Secondary | ICD-10-CM

## 2023-03-27 DIAGNOSIS — K219 Gastro-esophageal reflux disease without esophagitis: Secondary | ICD-10-CM

## 2023-03-27 DIAGNOSIS — Z23 Encounter for immunization: Secondary | ICD-10-CM

## 2023-03-27 DIAGNOSIS — M170 Bilateral primary osteoarthritis of knee: Secondary | ICD-10-CM

## 2023-03-27 DIAGNOSIS — M797 Fibromyalgia: Secondary | ICD-10-CM

## 2023-03-27 DIAGNOSIS — G4700 Insomnia, unspecified: Secondary | ICD-10-CM

## 2023-03-27 DIAGNOSIS — E559 Vitamin D deficiency, unspecified: Secondary | ICD-10-CM

## 2023-03-27 DIAGNOSIS — N182 Chronic kidney disease, stage 2 (mild): Secondary | ICD-10-CM | POA: Diagnosis not present

## 2023-03-27 DIAGNOSIS — I10 Essential (primary) hypertension: Secondary | ICD-10-CM

## 2023-03-27 DIAGNOSIS — Z1329 Encounter for screening for other suspected endocrine disorder: Secondary | ICD-10-CM

## 2023-03-27 DIAGNOSIS — F3341 Major depressive disorder, recurrent, in partial remission: Secondary | ICD-10-CM

## 2023-03-27 DIAGNOSIS — Z79899 Other long term (current) drug therapy: Secondary | ICD-10-CM

## 2023-03-27 DIAGNOSIS — Z6822 Body mass index (BMI) 22.0-22.9, adult: Secondary | ICD-10-CM

## 2023-03-27 DIAGNOSIS — N183 Chronic kidney disease, stage 3 unspecified: Secondary | ICD-10-CM

## 2023-03-27 DIAGNOSIS — R296 Repeated falls: Secondary | ICD-10-CM

## 2023-03-27 DIAGNOSIS — Z0001 Encounter for general adult medical examination with abnormal findings: Secondary | ICD-10-CM

## 2023-03-27 DIAGNOSIS — Z136 Encounter for screening for cardiovascular disorders: Secondary | ICD-10-CM

## 2023-03-27 DIAGNOSIS — E1122 Type 2 diabetes mellitus with diabetic chronic kidney disease: Secondary | ICD-10-CM | POA: Diagnosis not present

## 2023-03-27 DIAGNOSIS — I700 Atherosclerosis of aorta: Secondary | ICD-10-CM

## 2023-03-27 DIAGNOSIS — N3281 Overactive bladder: Secondary | ICD-10-CM

## 2023-03-27 DIAGNOSIS — K589 Irritable bowel syndrome without diarrhea: Secondary | ICD-10-CM

## 2023-03-27 DIAGNOSIS — E1169 Type 2 diabetes mellitus with other specified complication: Secondary | ICD-10-CM

## 2023-03-27 DIAGNOSIS — M5136 Other intervertebral disc degeneration, lumbar region with discogenic back pain only: Secondary | ICD-10-CM

## 2023-03-27 DIAGNOSIS — M85831 Other specified disorders of bone density and structure, right forearm: Secondary | ICD-10-CM

## 2023-03-27 DIAGNOSIS — C8590 Non-Hodgkin lymphoma, unspecified, unspecified site: Secondary | ICD-10-CM

## 2023-03-27 MED ORDER — ESOMEPRAZOLE MAGNESIUM 40 MG PO CPDR: 40.0000 mg | DELAYED_RELEASE_CAPSULE | Freq: Every day | ORAL | 2 refills | Status: AC

## 2023-03-27 MED ORDER — FLUOXETINE HCL 20 MG PO CAPS
20.0000 mg | ORAL_CAPSULE | Freq: Every day | ORAL | 2 refills | Status: AC
Start: 2023-03-27 — End: 2024-03-26

## 2023-03-27 NOTE — Patient Instructions (Signed)

## 2023-03-28 LAB — CBC WITH DIFFERENTIAL/PLATELET
Absolute Monocytes: 616 {cells}/uL (ref 200–950)
Basophils Absolute: 38 {cells}/uL (ref 0–200)
Basophils Relative: 0.5 %
Eosinophils Absolute: 122 {cells}/uL (ref 15–500)
Eosinophils Relative: 1.6 %
HCT: 39.9 % (ref 35.0–45.0)
Hemoglobin: 13.1 g/dL (ref 11.7–15.5)
Lymphs Abs: 1566 {cells}/uL (ref 850–3900)
MCH: 30.5 pg (ref 27.0–33.0)
MCHC: 32.8 g/dL (ref 32.0–36.0)
MCV: 93 fL (ref 80.0–100.0)
MPV: 9.8 fL (ref 7.5–12.5)
Monocytes Relative: 8.1 %
Neutro Abs: 5259 {cells}/uL (ref 1500–7800)
Neutrophils Relative %: 69.2 %
Platelets: 304 10*3/uL (ref 140–400)
RBC: 4.29 10*6/uL (ref 3.80–5.10)
RDW: 12.4 % (ref 11.0–15.0)
Total Lymphocyte: 20.6 %
WBC: 7.6 10*3/uL (ref 3.8–10.8)

## 2023-03-28 LAB — COMPLETE METABOLIC PANEL WITH GFR
AG Ratio: 1.6 (calc) (ref 1.0–2.5)
ALT: 10 U/L (ref 6–29)
AST: 14 U/L (ref 10–35)
Albumin: 4.5 g/dL (ref 3.6–5.1)
Alkaline phosphatase (APISO): 68 U/L (ref 37–153)
BUN: 20 mg/dL (ref 7–25)
CO2: 31 mmol/L (ref 20–32)
Calcium: 10.3 mg/dL (ref 8.6–10.4)
Chloride: 101 mmol/L (ref 98–110)
Creat: 0.91 mg/dL (ref 0.60–0.95)
Globulin: 2.9 g/dL (ref 1.9–3.7)
Glucose, Bld: 81 mg/dL (ref 65–99)
Potassium: 5.3 mmol/L (ref 3.5–5.3)
Sodium: 141 mmol/L (ref 135–146)
Total Bilirubin: 0.9 mg/dL (ref 0.2–1.2)
Total Protein: 7.4 g/dL (ref 6.1–8.1)
eGFR: 62 mL/min/{1.73_m2} (ref >=60)

## 2023-03-28 LAB — URINALYSIS, ROUTINE W REFLEX MICROSCOPIC
Bilirubin Urine: NEGATIVE
Hgb urine dipstick: NEGATIVE
Leukocytes,Ua: NEGATIVE
Nitrite: NEGATIVE
Specific Gravity, Urine: 1.022 (ref 1.001–1.035)
Squamous Epithelial / HPF: NONE SEEN /[HPF] (ref <=5)
pH: 6 (ref 5.0–8.0)

## 2023-03-28 LAB — MICROALBUMIN / CREATININE URINE RATIO
Creatinine, Urine: 188 mg/dL (ref 20–275)
Microalb Creat Ratio: 101 mg/g{creat} — ABNORMAL HIGH (ref <30)
Microalb, Ur: 18.9 mg/dL

## 2023-03-28 LAB — MICROSCOPIC MESSAGE

## 2023-03-28 LAB — LIPID PANEL
Cholesterol: 273 mg/dL — ABNORMAL HIGH (ref <200)
HDL: 50 mg/dL (ref >=50)
LDL Cholesterol (Calc): 177 mg/dL — ABNORMAL HIGH
Non-HDL Cholesterol (Calc): 223 mg/dL — ABNORMAL HIGH (ref <130)
Total CHOL/HDL Ratio: 5.5 (calc) — ABNORMAL HIGH (ref <5.0)
Triglycerides: 256 mg/dL — ABNORMAL HIGH (ref <150)

## 2023-03-28 LAB — VITAMIN D 25 HYDROXY (VIT D DEFICIENCY, FRACTURES): Vit D, 25-Hydroxy: 62 ng/mL (ref 30–100)

## 2023-03-28 LAB — MAGNESIUM: Magnesium: 2.1 mg/dL (ref 1.5–2.5)

## 2023-03-28 LAB — TSH: TSH: 1.92 m[IU]/L (ref 0.40–4.50)

## 2023-03-28 LAB — HEMOGLOBIN A1C W/OUT EAG: Hgb A1c MFr Bld: 5.7 %{Hb} — ABNORMAL HIGH (ref <5.7)

## 2023-04-04 ENCOUNTER — Other Ambulatory Visit: Payer: Self-pay | Admitting: Nurse Practitioner

## 2023-04-04 DIAGNOSIS — F411 Generalized anxiety disorder: Secondary | ICD-10-CM

## 2023-05-31 ENCOUNTER — Ambulatory Visit: Payer: Medicare Other | Admitting: Internal Medicine

## 2023-06-28 ENCOUNTER — Encounter: Payer: Self-pay | Admitting: Internal Medicine

## 2023-06-28 NOTE — Progress Notes (Unsigned)
N  Debra Short      ADULT   &   ADOLESCENT      INTERNAL MEDICINE  Debra Short, M.D.          Debra Short, Debra Short        Debra Short, Debra Short  [facility] [address]  [place], [place]. [address] Telephone [phone] Telefax [phone]   Future Appointments  Date Time Provider Department  06/29/2023              3 mo ov  2:30 PM Debra Cowboy, MD [facility]  12/25/2023              wellness  2:30 PM Debra Dick, NP [facility]  03/27/2024             cpe  3:00 PM Debra Dick, NP [facility]    History of Present Illness:       This very nice 83 y.o.  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   Patient has Aortic Atherosclerosis by CXR in 2019         Patient is treated for HTN  (2012)  & BP has been controlled at home. Today's BP is at goal -  136/84. Patient has had no complaints of any cardiac type chest pain, palpitations, dyspnea / orthopnea / PND, dizziness, claudication, or dependent edema.        Hyperlipidemia is  not controlled with diet & rosuvastatin . Patient denies myalgias or other med SE's. Last Lipids were not at goal :  Lab Results  Component Value Date   CHOL 252 (H) 05/17/2022   HDL 50 05/17/2022   LDLCALC 162 (H) 05/17/2022   TRIG 238 (H) 05/17/2022   CHOLHDL 5.0 (H) 05/17/2022     Also, the patient has history of  P{reDM ( 2009) & then transitioning into T2_NIDDM  (2016) /CKD3a  and has had no symptoms of reactive hypoglycemia, diabetic polys, paresthesias or  visual blurring.  Last A1c was near goal "  Lab Results  Component Value Date   HGBA1C 5.7 (H) 05/17/2022        Further, the patient also has history of Vitamin D Deficiency ("25" /2009)  and supplements vitamin D . Last vitamin  D was still low  (goal 70-100) :  Lab Results  Component Value Date   VD25OH 43 11/11/2021     Current Outpatient Medications on File Prior to Visit  Medication Sig   acetaminophen (TYLENOL) 650 MG CR tablet Take 650 mg by mouth See admin instructions. Take 650 mg by mouth at bedtime and an additional 650 mg during the day as needed for pain   ALPRAZolam (XANAX) 1 MG tablet TAKE 1/2-1 TABLET 2-3 x /DAY  IF NEEDED    Cholecalciferol (VITAMIN D-3 PO) Take 1-2 capsules by mouth daily. 2000 iu per day   esomeprazole (NEXIUM) 40 MG capsule TAKE 1 CAPSULE DAILY TO PREVENT HEARTBURN & INDIGESTION   FLUoxetine (PROZAC) 40 MG capsule TAKE 1 CAPSULE DAILY FOR MOOD   FLONASE  nasal spray Place 1-2 sprays into both nostrils daily as needed    metFORMIN -XR) 500 MG 24 hr tablet TAKE 2 TABLETS  2 X /DAY WITH MEALS    Multiple Vitamins-Minerals (ICAPS) CAPS Take 1 capsule  daily with lunch.   Multiple Vitamins-Minerals  Take 1 tablet daily with breakfast.   SYSTANE  Place 1 drop into both eyes 4 (four) times daily as needed    rosuvastatin 10 MG tablet Take 1 tablet  daily.   sodium chloride  5 % ophth  soln 1 drop into eyes in the morning, noon, and bedtime.   solifenacin (VESICARE) 10 MG tablet Take 1 tab daily for over active bladder.   traZODone (DESYREL) 50 MG tablet TAKE 1/2-1 TABLET  FOR SLEEP.     Allergies  Allergen Reactions   Effexor [Venlafaxine] Other (See Comments)    Not effective   Nortriptyline Other (See Comments)    Not effective    Spicy foods cause heartburn   Paxil [Paroxetine Hcl] Other (See Comments)    Not effective   Robaxin [Methocarbamol] Itching   Tizanidine Itching   Zoloft [Sertraline Hcl] Other (See Comments)    Not effective      PMHx:   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HCC)    Subdural hematoma (HCC) 11/02/2020   Vitamin D deficiency      Immunization History  Administered Date(s) Administered   Influenza Split 04/10/2013, 03/23/2016   Influenza, High Dose  05/13/2021, 05/17/2022   Influenza-Unspecified 03/27/2018   Moderna Covid-19 Vaccine B 05/01/2021   Moderna Sars-Covid-2 Vacc 07/26/2019, 08/23/2019, 04/21/2020   Pneumococcal - 13 03/24/2014   Pneumococcal - 23 06/13/2008   Td 09/24/2012   Tdap 09/26/2014, 09/25/2020, 11/02/2020   Zoster, Live 02/23/2011     FHx:    Reviewed / unchanged   SHx:    Reviewed / unchanged    Systems Review:  Constitutional: Denies fever, chills, wt changes, headaches, insomnia, fatigue, night sweats, change in appetite. Eyes: Denies redness, blurred vision, diplopia, discharge, itchy, watery eyes.  ENT: Denies discharge, congestion, post nasal drip, epistaxis, sore throat, earache, hearing loss, dental pain, tinnitus, vertigo, sinus pain, snoring.  CV: Denies chest pain, palpitations, irregular heartbeat, syncope, dyspnea, diaphoresis, orthopnea, PND, claudication or edema. Respiratory: denies cough, dyspnea, DOE, pleurisy, hoarseness, laryngitis, wheezing.  Gastrointestinal: Denies dysphagia, odynophagia, heartburn, reflux, water brash, abdominal pain or cramps, nausea, vomiting, bloating, diarrhea, constipation, hematemesis, melena, hematochezia  or hemorrhoids. Genitourinary: Denies dysuria, frequency,  urgency, nocturia, hesitancy, discharge, hematuria or flank pain. Musculoskeletal: Denies arthralgias, myalgias, stiffness, jt. swelling, pain, limping or strain/sprain.  Skin: Denies pruritus, rash, hives, warts, acne, eczema or change in skin lesion(s). Neuro: No  weakness, tremor, incoordination, spasms, paresthesia or pain. Psychiatric: Denies confusion, memory loss or sensory loss. Endo: Denies change in weight, skin or hair change.  Heme/Lymph: No excessive bleeding, bruising or enlarged lymph nodes.   Physical Exam  There were no vitals taken for this visit.  Appears  well nourished, well groomed  and in no distress.  Eyes: PERRLA, EOMs, conjunctiva no swelling or erythema. Sinuses: No frontal/maxillary tenderness ENT/Mouth: EAC's clear, TM's nl w/o erythema, bulging. Nares clear w/o erythema, swelling, exudates. Oropharynx clear without erythema or exudates. Oral hygiene is good. Tongue normal, non obstructing. Hearing intact.  Neck: Supple. Thyroid not palpable. Car 2+/2+ without bruits, nodes or JVD. Chest: Respirations nl with BS clear & equal w/o rales, rhonchi, wheezing or stridor.  Cor: Heart sounds normal w/ regular rate and rhythm without sig. murmurs, gallops, clicks or rubs. Peripheral pulses normal and equal  without edema.  Abdomen: Soft & bowel sounds normal. Non-tender w/o guarding, rebound, hernias, masses or organomegaly.  Lymphatics: Unremarkable.  Musculoskeletal: Full ROM all peripheral extremities, joint stability, 5/5 strength and normal gait.  Skin: Warm, dry without exposed rashes, lesions or ecchymosis apparent.  Neuro: Cranial nerves intact, reflexes equal bilaterally. Sensory-motor testing grossly intact. Tendon reflexes grossly intact.  Pysch: Alert & oriented x 3.  Insight and judgement nl & appropriate. No ideations.   Assessment and Plan:   1. Essential hypertension  - Continue medication, monitor blood pressure at home.  - Continue DASH diet.  Reminder to go to the ER if any CP,  SOB, nausea, dizziness, severe HA, changes vision/speech.     - CBC with Differential/Platelet - COMPLETE METABOLIC PANEL WITH GFR - Magnesium - TSH  2.Hyperlipidemia associated with type 2 diabetes mellitus (HCC)  -  Continue diet/meds, exercise,& lifestyle modifications.  - Continue monitor periodic cholesterol/liver & renal functions      - Lipid panel - TSH  3. Type 2 diabetes mellitus with stage 3a chronic kidney disease, without long-term current use of insulin (HCC)  - Continue diet, exercise  - Lifestyle modifications.  - Monitor appropriate labs    - Hemoglobin A1c - Insulin, random  4. Vitamin D deficiency  - VIT - Continue supplementation  AMIN D 25 Hydroxy  5. Aortic atherosclerosis (HCC) by CXR in 2019  - Lipid panel  6. Gastroesophageal reflux disease,  - CBC with Differential/Platelet  7. Medication management  - CBC with Differential/Platelet - COMPLETE METABOLIC PANEL WITH GFR - Magnesium - Lipid panel - TSH - Hemoglobin A1c - Insulin, random - VITAMIN D 25 Hydroxy         Discussed  regular exercise, BP monitoring, weight control to achieve/maintain BMI less than 25 and discussed med and SE's. Recommended labs to assess /monitor clinical status .  I discussed the assessment and treatment plan with the patient. The patient was provided an opportunity to ask questions and all were answered. The patient agreed with the plan and demonstrated an understanding of the instructions.  I provided over 30 minutes of exam, counseling, chart review and  complex critical decision making.        The patient was advised to call back or seek an in-person evaluation if the symptoms worsen or if the condition fails to improve as anticipated.   Weber Cooks  Oneta Rack, MD

## 2023-06-29 ENCOUNTER — Ambulatory Visit: Payer: Medicare Other | Admitting: Internal Medicine

## 2023-06-29 DIAGNOSIS — K219 Gastro-esophageal reflux disease without esophagitis: Secondary | ICD-10-CM

## 2023-06-29 DIAGNOSIS — I700 Atherosclerosis of aorta: Secondary | ICD-10-CM

## 2023-06-29 DIAGNOSIS — E1169 Type 2 diabetes mellitus with other specified complication: Secondary | ICD-10-CM

## 2023-06-29 DIAGNOSIS — E559 Vitamin D deficiency, unspecified: Secondary | ICD-10-CM

## 2023-06-29 DIAGNOSIS — I10 Essential (primary) hypertension: Secondary | ICD-10-CM

## 2023-06-29 DIAGNOSIS — Z79899 Other long term (current) drug therapy: Secondary | ICD-10-CM

## 2023-06-29 DIAGNOSIS — E1122 Type 2 diabetes mellitus with diabetic chronic kidney disease: Secondary | ICD-10-CM

## 2023-07-11 ENCOUNTER — Other Ambulatory Visit: Payer: Self-pay | Admitting: Nurse Practitioner

## 2023-07-11 DIAGNOSIS — F411 Generalized anxiety disorder: Secondary | ICD-10-CM

## 2023-07-17 ENCOUNTER — Ambulatory Visit: Payer: Medicare Other | Admitting: Internal Medicine

## 2023-08-25 ENCOUNTER — Other Ambulatory Visit: Payer: Self-pay

## 2023-08-25 MED ORDER — FLUTICASONE PROPIONATE 50 MCG/ACT NA SUSP: 1.0000 | Freq: Every day | NASAL | 2 refills | Status: AC | PRN

## 2023-10-09 ENCOUNTER — Ambulatory Visit: Payer: Medicare Other | Admitting: Nurse Practitioner

## 2023-11-23 ENCOUNTER — Ambulatory Visit: Payer: Medicare Other | Admitting: Nurse Practitioner

## 2023-12-25 ENCOUNTER — Ambulatory Visit: Payer: Medicare Other | Admitting: Nurse Practitioner

## 2024-01-10 ENCOUNTER — Ambulatory Visit: Payer: Medicare Other | Admitting: Nurse Practitioner

## 2024-03-27 ENCOUNTER — Encounter: Payer: Medicare Other | Admitting: Nurse Practitioner

## 2024-04-16 ENCOUNTER — Encounter: Payer: Medicare Other | Admitting: Nurse Practitioner

## 2024-04-29 ENCOUNTER — Other Ambulatory Visit: Payer: Self-pay

## 2024-04-29 DIAGNOSIS — Z1231 Encounter for screening mammogram for malignant neoplasm of breast: Secondary | ICD-10-CM

## 2024-04-30 ENCOUNTER — Ambulatory Visit
Admission: RE | Admit: 2024-04-30 | Discharge: 2024-04-30 | Disposition: A | Discharge status: Home / Self Care | Source: Ambulatory Visit

## 2024-04-30 DIAGNOSIS — Z1231 Encounter for screening mammogram for malignant neoplasm of breast: Secondary | ICD-10-CM

## 2024-05-07 ENCOUNTER — Other Ambulatory Visit: Payer: Self-pay

## 2024-05-07 DIAGNOSIS — R590 Localized enlarged lymph nodes: Secondary | ICD-10-CM

## 2024-05-20 ENCOUNTER — Inpatient Hospital Stay: Admission: RE | Admit: 2024-05-20

## 2024-05-23 ENCOUNTER — Inpatient Hospital Stay: Admission: RE | Admit: 2024-05-23 | Discharge: 2024-05-23

## 2024-05-23 DIAGNOSIS — R590 Localized enlarged lymph nodes: Secondary | ICD-10-CM

## 2024-07-22 ENCOUNTER — Ambulatory Visit: Payer: Medicare Other | Admitting: Internal Medicine
